# Patient Record
Sex: Female | Born: 1940 | Race: White | Hispanic: No | Smoking: Former smoker
Health system: Southern US, Community
[De-identification: ages and names within clinical notes are randomized; demographics above are authoritative.]

## PROBLEM LIST (undated history)

## (undated) ENCOUNTER — Emergency Department (HOSPITAL_BASED_OUTPATIENT_CLINIC_OR_DEPARTMENT_OTHER): Admission: EM | Payer: Medicare HMO | Source: Home / Self Care

## (undated) DIAGNOSIS — E119 Type 2 diabetes mellitus without complications: Secondary | ICD-10-CM

## (undated) DIAGNOSIS — M810 Age-related osteoporosis without current pathological fracture: Secondary | ICD-10-CM

## (undated) DIAGNOSIS — E785 Hyperlipidemia, unspecified: Secondary | ICD-10-CM

## (undated) DIAGNOSIS — I10 Essential (primary) hypertension: Secondary | ICD-10-CM

## (undated) DIAGNOSIS — J449 Chronic obstructive pulmonary disease, unspecified: Secondary | ICD-10-CM

## (undated) DIAGNOSIS — I509 Heart failure, unspecified: Secondary | ICD-10-CM

## (undated) HISTORY — DX: Age-related osteoporosis without current pathological fracture: M81.0

## (undated) HISTORY — DX: Heart failure, unspecified: I50.9

## (undated) HISTORY — DX: Essential (primary) hypertension: I10

## (undated) HISTORY — DX: Hyperlipidemia, unspecified: E78.5

## (undated) HISTORY — DX: Chronic obstructive pulmonary disease, unspecified: J44.9

## (undated) HISTORY — DX: Type 2 diabetes mellitus without complications: E11.9

---

## 2018-01-02 DIAGNOSIS — I509 Heart failure, unspecified: Secondary | ICD-10-CM | POA: Diagnosis not present

## 2018-01-02 DIAGNOSIS — J449 Chronic obstructive pulmonary disease, unspecified: Secondary | ICD-10-CM | POA: Diagnosis not present

## 2018-01-02 DIAGNOSIS — M810 Age-related osteoporosis without current pathological fracture: Secondary | ICD-10-CM | POA: Diagnosis not present

## 2018-01-02 DIAGNOSIS — E119 Type 2 diabetes mellitus without complications: Secondary | ICD-10-CM | POA: Diagnosis not present

## 2018-01-02 DIAGNOSIS — I10 Essential (primary) hypertension: Secondary | ICD-10-CM | POA: Diagnosis not present

## 2018-01-02 DIAGNOSIS — E785 Hyperlipidemia, unspecified: Secondary | ICD-10-CM | POA: Diagnosis not present

## 2018-01-06 DIAGNOSIS — J449 Chronic obstructive pulmonary disease, unspecified: Secondary | ICD-10-CM | POA: Diagnosis not present

## 2018-01-06 DIAGNOSIS — J9611 Chronic respiratory failure with hypoxia: Secondary | ICD-10-CM | POA: Diagnosis not present

## 2018-01-06 DIAGNOSIS — J432 Centrilobular emphysema: Secondary | ICD-10-CM | POA: Diagnosis not present

## 2018-01-25 DIAGNOSIS — N3000 Acute cystitis without hematuria: Secondary | ICD-10-CM | POA: Diagnosis not present

## 2018-02-02 DIAGNOSIS — I10 Essential (primary) hypertension: Secondary | ICD-10-CM | POA: Diagnosis not present

## 2018-02-02 DIAGNOSIS — N390 Urinary tract infection, site not specified: Secondary | ICD-10-CM | POA: Diagnosis not present

## 2018-02-02 DIAGNOSIS — M545 Low back pain: Secondary | ICD-10-CM | POA: Diagnosis not present

## 2018-02-05 ENCOUNTER — Ambulatory Visit: Payer: Medicare HMO | Admitting: Pulmonary Disease

## 2018-02-05 ENCOUNTER — Ambulatory Visit (INDEPENDENT_AMBULATORY_CARE_PROVIDER_SITE_OTHER)
Admission: RE | Admit: 2018-02-05 | Discharge: 2018-02-05 | Disposition: A | Discharge status: Home / Self Care | Payer: Medicare HMO | Source: Ambulatory Visit | Attending: Pulmonary Disease | Admitting: Pulmonary Disease

## 2018-02-05 ENCOUNTER — Encounter: Payer: Self-pay | Admitting: Pulmonary Disease

## 2018-02-05 VITALS — BP 116/78 | HR 80 | Ht 62.0 in | Wt 161.0 lb

## 2018-02-05 DIAGNOSIS — R0600 Dyspnea, unspecified: Secondary | ICD-10-CM | POA: Diagnosis not present

## 2018-02-05 DIAGNOSIS — J449 Chronic obstructive pulmonary disease, unspecified: Secondary | ICD-10-CM

## 2018-02-05 DIAGNOSIS — Z23 Encounter for immunization: Secondary | ICD-10-CM

## 2018-02-05 DIAGNOSIS — J9612 Chronic respiratory failure with hypercapnia: Secondary | ICD-10-CM | POA: Insufficient documentation

## 2018-02-05 DIAGNOSIS — R0609 Other forms of dyspnea: Secondary | ICD-10-CM | POA: Diagnosis not present

## 2018-02-05 DIAGNOSIS — J432 Centrilobular emphysema: Secondary | ICD-10-CM | POA: Diagnosis not present

## 2018-02-05 DIAGNOSIS — J9611 Chronic respiratory failure with hypoxia: Secondary | ICD-10-CM | POA: Insufficient documentation

## 2018-02-05 NOTE — Assessment & Plan Note (Signed)
We will write prescription for new portable concentrator to [facility].   Handicapped placard    Referral to pulmonary rehab at [facility]

## 2018-02-05 NOTE — Assessment & Plan Note (Signed)
Continue on Anoro. Prevnar vaccine Please take flu shot every year around late September

## 2018-02-05 NOTE — Patient Instructions (Signed)
Continue on Anoro.  We will write prescription for new portable concentrator to [facility]. Breathing test today.  Handicapped placard  Prevnar vaccine Please take flu shot every year around late September  Referral to pulmonary rehab at [facility]

## 2018-02-05 NOTE — Addendum Note (Signed)
Addended by: Maurene Capes on: 02/05/2018 02:09 PM   Modules accepted: Orders

## 2018-02-05 NOTE — Progress Notes (Signed)
Subjective:    Patient ID: Rebecca [name], female    DOB: Jul 01, 1941, 77 y.o.   MRN: [id number]  HPI  77 year old ex-smoker presents to establish care for COPD and chronic respiratory failure. She smoked about a pack per day until she quit in 2003, more than 40 pack years.  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  At that point she was provided oxygen and has been maintained on 3 L since then, about 2018 she increased to 4 L and walking.  There are times when she stays off oxygen at rest. She has moved from [place] to [place] to be closer to her daughter and lives at [place] in assisted living.  She reports occasional wheezing and a dry cough.  Is able to walk on level ground, in the store she had generally rides or use a Rollator because she is unable to carry her oxygen.  She admits to a sedentary lifestyle and is unable to walk her dog for long distances.  She denies frequent chest colds.  She would like a handicap placard today. She also requests a new portable concentrator since hers is beeping and she would like one for travel   She has received Pneumovax but has not received Prevnar.  She did not get the flu shot last year.  Past medical history-diabetes, hyperlipidemia   Significant tests/ events reviewed Spirometry performed today showed severe airway obstruction with ratio 43, FEV1 of 0.57-31% and FVC of 53%.  Oxygen saturation was 91% RA at rest , dropped to 82% on room air, and stayed up on 3 L pulse O2 on ambulation   Past Medical History:  Diagnosis Date  . Congestive heart disease (HCC)   . COPD (chronic obstructive pulmonary disease) (HCC)   . Diabetes (HCC)   . Hyperlipemia   . Osteoporosis   . Primary hypertension      PSH -hysterectomy, tubal ligation, back  surgery   Allergies  Allergen Reactions  . Ciprofloxacin Other (See Comments)    Digestive problems  . Amoxicillin Rash  . Lisinopril Rash  . Sulfacetamide Rash  . Tetracyclines & Related Rash    Social History   Socioeconomic History  . Marital status: Widowed    Spouse name: Not on file  . Number of children: Not on file  . Years of education: Not on file  . Highest education level: Not on file  Occupational History  . Not on file  Social Needs  . Financial resource strain: Not on file  . Food insecurity:    Worry: Not on file    Inability: Not on file  . Transportation needs:    Medical: Not on file    Non-medical: Not on file  Tobacco Use  . Smoking status: Former Smoker    Types: Cigarettes    Last attempt to quit: 2003    Years since quitting: 16.5  . Smokeless tobacco: Never Used  Substance and Sexual Activity  . Alcohol use: Yes    Alcohol/week: 0.6 oz    Types: 1 Glasses of wine per week  . Drug use: Never  . Sexual activity: Not on file  Lifestyle  . Physical activity:    Days per week: Not on file    Minutes per session: Not on file  . Stress: Not on file  Relationships  . Social connections:    Talks on phone: Not on file    Gets together: Not on file    Attends religious service: Not on file    Active member of club or organization: Not on file    Attends meetings of clubs or organizations: Not on file    Relationship status: Not on file  . Intimate partner violence:    Fear of current or ex partner: Not on file    Emotionally abused: Not on file    Physically abused: Not on file    Forced sexual activity: Not on file  Other Topics Concern  . Not on file  Social History Narrative  . Not on file     Family history of heart disease in father and mother  Review of Systems  Constitutional: Negative for fever and unexpected weight change.  HENT: Negative for congestion, dental problem, ear pain, nosebleeds, postnasal drip, rhinorrhea,  sinus pressure, sneezing, sore throat and trouble swallowing.   Eyes: Negative for redness and itching.  Respiratory: Positive for shortness of breath. Negative for cough, chest tightness and wheezing.   Cardiovascular: Negative for palpitations and leg swelling.  Gastrointestinal: Negative for nausea and vomiting.  Genitourinary: Negative for dysuria.  Musculoskeletal: Negative for joint swelling.  Skin: Negative for rash.  Allergic/Immunologic: Negative.  Negative for environmental allergies, food allergies and immunocompromised state.  Neurological: Negative for headaches.  Hematological: Does not bruise/bleed easily.  Psychiatric/Behavioral: Negative for dysphoric mood. The patient is not nervous/anxious.        Objective:   Physical Exam  Gen. Pleasant, elderly,well-nourished, in no distress, on [place],normal affect ENT - no lesions, no post nasal drip Neck: No JVD, no thyromegaly, no carotid bruits Lungs: no use of accessory muscles, no dullness to percussion, decreased without rales or rhonchi  Cardiovascular: Rhythm regular, heart sounds  normal, no murmurs or gallops, no peripheral edema Abdomen: soft and non-tender, no hepatosplenomegaly, BS normal. Musculoskeletal: No deformities, no cyanosis or clubbing Neuro:  [name], non focal       Assessment & Plan:

## 2018-02-06 ENCOUNTER — Telehealth (HOSPITAL_COMMUNITY): Payer: Self-pay

## 2018-02-06 NOTE — Telephone Encounter (Signed)
Attempted to contact patient in regards to Pulmonary Rehab - lm on vm °

## 2018-02-09 ENCOUNTER — Telehealth (HOSPITAL_COMMUNITY): Payer: Self-pay

## 2018-02-09 NOTE — Telephone Encounter (Signed)
Patient returned phone call in regards to Pulmonary Rehab - Patient stated she is interested in the program. She will come in for orientation on 02/23/18 @ 9:30AM and will attend the 10:30AM class.

## 2018-02-10 ENCOUNTER — Telehealth: Payer: Self-pay | Admitting: Pulmonary Disease

## 2018-02-10 NOTE — Telephone Encounter (Signed)
Called and spoke with Patient.  She stated that she received a phone call 02/09/18 with CXR results.  She stated that she was told it showed emphysema and that Dr. Vassie Loll would discuss further at next OV.  Her next OV is 07/2018.  She stated that she had questions and would like Dr. Vassie Loll to call her at [phone], and answer her questions. I explained that Dr. Vassie Loll was not in the office this week, but I would send him a message.  Will route to Dr. Vassie Loll

## 2018-02-12 NOTE — Telephone Encounter (Signed)
Routing message to RA to advise

## 2018-02-17 NOTE — Telephone Encounter (Signed)
Pease advise, Dr Vassie Loll.  Thank you.

## 2018-02-18 NOTE — Telephone Encounter (Signed)
Clarified CXR results Emphasized pulm rehab  Reschedule OV in 2 months with NP/ me

## 2018-02-19 NOTE — Telephone Encounter (Signed)
Attempted to call patient today regarding results. I did not receive an answer at time of call. I have left a voicemail message for pt to return call. X1  

## 2018-02-19 NOTE — Telephone Encounter (Signed)
lmtcb for pt. Pt just needs to be scheduled for a 2 month follow up.Marland Kitchen

## 2018-02-19 NOTE — Telephone Encounter (Signed)
Pt is calling back [phone]

## 2018-02-20 NOTE — Telephone Encounter (Signed)
Called spoke with patient who reported she had already spoken with RA about clarification on the cxr results. Scheduled appt with RA on 10.1.19 @ 1015. Nothing further needed; will sign off.

## 2018-02-20 NOTE — Telephone Encounter (Signed)
Attempted to call pt. I did not receive an answer. I have left a message for pt to return our call.  

## 2018-02-20 NOTE — Telephone Encounter (Signed)
Pt is returning call. Cb is [phone]

## 2018-02-27 ENCOUNTER — Encounter (HOSPITAL_COMMUNITY): Payer: Self-pay

## 2018-02-27 ENCOUNTER — Encounter (HOSPITAL_COMMUNITY)
Admission: RE | Admit: 2018-02-27 | Discharge: 2018-02-27 | Disposition: A | Discharge status: Home / Self Care | Payer: Medicare HMO | Source: Ambulatory Visit | Attending: Pulmonary Disease | Admitting: Pulmonary Disease

## 2018-02-27 VITALS — BP 148/62 | HR 73 | Temp 98.1°F | Resp 20 | Ht 62.5 in | Wt 160.5 lb

## 2018-02-27 DIAGNOSIS — J9611 Chronic respiratory failure with hypoxia: Secondary | ICD-10-CM | POA: Insufficient documentation

## 2018-02-27 NOTE — Progress Notes (Signed)
Rebecca [name] Alert 77 y.o. female Pulmonary Rehab Orientation Note [name] referred to pulmonary rehab by Dr. Vassie Loll for Chronic Respiratory Failure hypoxia, arrived today in Cardiac and Pulmonary Rehab for orientation to Pulmonary Rehab. She was transported from [facility] [employer] via wheel chair accompanied by her granddaughterl. She does carry portable oxygen. Per pt, she uses oxygen continuously. Color good, skin warm and dry. Patient is oriented to time and place. Patient's medical history, psychosocial health, and medications reviewed. Psychosocial assessment reveals pt lives in an assisted living facility. Pt is currently retired. Pt hobbies include painting. Pt reports her stress level is not applicable since moving to [place] to be closer to family. Pt admits to having a history of depression years back when her husband passed away.  Pt did not have a core group of friends so when he passed away she was very lonely.  Pt feels so much better being in the assistive living facility where she has developed friends and participates in many of the activities at the facility.  Pt is now lives closer to family and this has been awesome for her. PHQ2/9 score 0/1. Pt shows good  coping skills with positive outlook .  Will continue to monitor and evaluate progress toward psychosocial goal(s) of continued mental well being. Physical assessment reveals heart rate is normal, breath sounds diminished. Grip strength equal, strong. Distal pulses palpable with trace swelling in the ankles. Patient reports she does take medications as prescribed. Patient states she follows a Regular diet. The patient reports no specific efforts to gain or lose weight. She has gained 9 pounds since moving to the facility.  Pt feels this is because she is eating three meals a day in the dining hall.  Pt tries to chose healthier items and would like to lose the 10 pounds.  Patient's weight will be monitored closely. Demonstration and practice of  PLB using pulse oximeter. Patient able to return demonstration satisfactorily. Safety and hand hygiene in the exercise area reviewed with patient. Patient voices understanding of the information reviewed. Department expectations discussed with patient and achievable goals were set. The patient shows enthusiasm about attending the program and we look forward to working with this nice lady. The patient is scheduled for a 6 min walk test on 8/22 at 1030 because of the availability of transportation to bring her and to begin exercise on 8/29 at 10:30.  45 minutes was spent on a variety of activities such as assessment of the patient, obtaining baseline data including height, weight, BMI, and grip strength, verifying medical history, allergies, and current medications, and teaching patient strategies for performing tasks with less respiratory effort with emphasis on pursed lip breathing. Karlene Lineman RN, BSN Cardiac and Emergency planning/management officer [phone]

## 2018-03-03 NOTE — Progress Notes (Signed)
Rebecca [name] 77 y.o. female  DOB: 03/25/1941 MRN: [id number]           Nutrition Note 1. Chronic respiratory failure with hypoxia [name])    Past Medical History:  Diagnosis Date  . Congestive heart disease (HCC)   . COPD (chronic obstructive pulmonary disease) (HCC)   . Diabetes (HCC)   . Hyperlipemia   . Osteoporosis   . Primary hypertension    Meds reviewed. Albuterol, calcium, vit D, coreg, metformin, crestor noted  Ht: Ht Readings from Last 1 Encounters:  02/27/18 5' 2.5" (1.588 m)     Wt:  Wt Readings from Last 3 Encounters:  02/27/18 160 lb 7.9 oz (72.8 kg)  02/05/18 161 lb (73 kg)     BMI: Body mass index is 28.89 kg/m.    Current tobacco use? No     Labs:  Lipid Panel  No results found for: CHOL, TRIG, HDL, CHOLHDL, VLDL, LDLCALC, LDLDIRECT  No results found for: HGBA1C  Nutrition Diagnosis  ? Overweight related to excessive energy intake as evidenced by a BMI of Body mass index is 28.89 kg/m.    Goal(s) 1. Pt to identify and limit food sources of sodium 2. Identify food quantities necessary to achieve wt loss of  -2# per week to a goal wt loss of 6-10 lbs at graduation from pulmonary rehab. 3. Describe the benefit of including fruits, vegetables, whole grains, and low-fat dairy products in a healthy meal plan.  Plan:  Pt to attend Pulmonary Nutrition class Will provide client-centered nutrition education as part of interdisciplinary care.    Monitor and Evaluate progress toward nutrition goal with team.   Rebecca Marcus, MS, RD, LDN 03/03/2018 9:37 AM

## 2018-03-04 ENCOUNTER — Encounter (HOSPITAL_COMMUNITY): Payer: Self-pay | Admitting: *Deleted

## 2018-03-05 ENCOUNTER — Encounter (HOSPITAL_COMMUNITY)
Admission: RE | Admit: 2018-03-05 | Discharge: 2018-03-05 | Disposition: A | Discharge status: Home / Self Care | Payer: Medicare HMO | Source: Ambulatory Visit | Attending: Pulmonary Disease | Admitting: Pulmonary Disease

## 2018-03-05 DIAGNOSIS — J9611 Chronic respiratory failure with hypoxia: Secondary | ICD-10-CM | POA: Diagnosis not present

## 2018-03-05 NOTE — Progress Notes (Signed)
Pulmonary Individual Treatment Plan  Patient Details  Name: Rebecca [name] MRN: [id number] Date of Birth: [birth year] Referring Provider:     Pulmonary Rehab Walk Test from 03/05/2018 in [facility]  Referring Provider  Vassie Loll      Initial Encounter Date:    Pulmonary Rehab Walk Test from 03/05/2018 in [facility]ARD[facility]  Date  03/05/18      Visit Diagnosis: Chronic respiratory failure with hypoxia (HCC)  Patient's Home Medications on Admission:   Current Outpatient Medications:  .  albuterol (PROVENTIL HFA;VENTOLIN HFA) 108 (90 Base) MCG/ACT inhaler, Inhale 1 puff into the lungs every 6 (six) hours as needed for wheezing or shortness of breath., Disp: , Rfl:  .  Calcium Carbonate-Vitamin D (CALCIUM 600+D) 600-200 MG-UNIT TABS, Take by mouth., Disp: , Rfl:  .  carvedilol (COREG) 25 MG tablet, 2 (two) times daily. , Disp: , Rfl:  .  imipramine (TOFRANIL) 10 MG tablet, , Disp: , Rfl:  .  metFORMIN (GLUCOPHAGE) 500 MG tablet, daily with supper. , Disp: , Rfl:  .  rosuvastatin (CRESTOR) 5 MG tablet, , Disp: , Rfl:  .  umeclidinium-vilanterol (ANORO ELLIPTA) 62.5-25 MCG/INH AEPB, , Disp: , Rfl:   Past Medical History: Past Medical History:  Diagnosis Date  . Congestive heart disease (HCC)   . COPD (chronic obstructive pulmonary disease) (HCC)   . Diabetes (HCC)   . Hyperlipemia   . Osteoporosis   . Primary hypertension     Tobacco Use: Social History   Tobacco Use  Smoking Status Former Smoker  . Types: Cigarettes  . Last attempt to quit: 2003  . Years since quitting: 16.6  Smokeless Tobacco Never Used    Labs: Recent Review Flowsheet Data    There is no flowsheet data to display.      Capillary Blood Glucose: No results found for: GLUCAP   Pulmonary Assessment Scores: Pulmonary Assessment Scores    Row Name 03/04/18 1518 03/05/18 1311       ADL UCSD   ADL Phase  Entry  Entry    SOB Score total  70  -       CAT Score   CAT Score  20  -      mMRC Score   mMRC Score  -  1       Pulmonary Function Assessment:   Exercise Target Goals: Exercise Program Goal: Individual exercise prescription set using results from initial 6 min walk test and THRR while considering  patient's activity barriers and safety.   Exercise Prescription Goal: Initial exercise prescription builds to 30-45 minutes a day of aerobic activity, 2-3 days per week.  Home exercise guidelines will be given to patient during program as part of exercise prescription that the participant will acknowledge.  Activity Barriers & Risk Stratification: Activity Barriers & Cardiac Risk Stratification - 02/27/18 1027      Activity Barriers & Cardiac Risk Stratification   Activity Barriers  Back Problems;History of Falls;Shortness of Breath;Deconditioning;Assistive Device;Joint Problems   Right knee cap surgery   Cardiac Risk Stratification  High       6 Minute Walk: 6 Minute Walk    Row Name 03/05/18 1306         6 Minute Walk   Phase  Initial     Distance  1000 feet     Walk Time  - 5 minutes 30 seconds     # of Rest Breaks  1 30 second  break standing     MPH  1.89     METS  2.38     RPE  12     Perceived Dyspnea   3     Symptoms  No     Resting HR  78 bpm     Resting BP  124/66     Resting Oxygen Saturation   97 %     Exercise Oxygen Saturation  during 6 min walk  90 %     Max Ex. HR  97 bpm     Max Ex. BP  124/84       Interval HR   1 Minute HR  84     2 Minute HR  91     3 Minute HR  95     4 Minute HR  97     5 Minute HR  94     6 Minute HR  94     2 Minute Post HR  85     Interval Heart Rate?  Yes       Interval Oxygen   Interval Oxygen?  Yes     Baseline Oxygen Saturation %  97 %     1 Minute Oxygen Saturation %  97 %     1 Minute Liters of Oxygen  4 L     2 Minute Oxygen Saturation %  92 %     2 Minute Liters of Oxygen  4 L     3 Minute Oxygen Saturation %  90 %     3 Minute Liters of  Oxygen  4 L     4 Minute Oxygen Saturation %  93 %     4 Minute Liters of Oxygen  4 L     5 Minute Oxygen Saturation %  94 %     5 Minute Liters of Oxygen  4 L     6 Minute Oxygen Saturation %  94 %     6 Minute Liters of Oxygen  4 L     2 Minute Post Oxygen Saturation %  100 %     2 Minute Post Liters of Oxygen  4 L        Oxygen Initial Assessment: Oxygen Initial Assessment - 03/05/18 1311      Initial 6 min Walk   Oxygen Used  Continuous;E-Tanks    Liters per minute  4      Program Oxygen Prescription   Program Oxygen Prescription  Continuous;E-Tanks    Liters per minute  4       Oxygen Re-Evaluation:   Oxygen Discharge (Final Oxygen Re-Evaluation):   Initial Exercise Prescription: Initial Exercise Prescription - 03/05/18 1300      Date of Initial Exercise RX and Referring Provider   Date  03/05/18    Referring Provider  Vassie Loll      Oxygen   Oxygen  Continuous    Liters  4      Recumbant Bike   Level  2    Watts  10    Minutes  17      NuStep   Level  2    SPM  80    Minutes  17    METs  1.5      Track   Laps  5    Minutes  17      Prescription Details   Frequency (times per week)  2    Duration  Progress to 45 minutes  of aerobic exercise without signs/symptoms of physical distress      Intensity   THRR 40-80% of Max Heartrate  57-114    Ratings of Perceived Exertion  11-13    Perceived Dyspnea  0-4      Progression   Progression  Continue progressive overload as per policy without signs/symptoms or physical distress.      Resistance Training   Training Prescription  Yes    Weight  orange bands    Reps  10-15       Perform Capillary Blood Glucose checks as needed.  Exercise Prescription Changes:   Exercise Comments:   Exercise Goals and Review: Exercise Goals    Row Name 02/27/18 1029             Exercise Goals   Increase Physical Activity  Yes       Intervention  Provide advice, education, support and counseling about  physical activity/exercise needs.;Develop an individualized exercise prescription for aerobic and resistive training based on initial evaluation findings, risk stratification, comorbidities and participant's personal goals.       Expected Outcomes  Short Term: Attend rehab on a regular basis to increase amount of physical activity.;Long Term: Add in home exercise to make exercise part of routine and to increase amount of physical activity.;Long Term: Exercising regularly at least 3-5 days a week.       Increase Strength and Stamina  Yes       Intervention  Provide advice, education, support and counseling about physical activity/exercise needs.;Develop an individualized exercise prescription for aerobic and resistive training based on initial evaluation findings, risk stratification, comorbidities and participant's personal goals.       Expected Outcomes  Short Term: Increase workloads from initial exercise prescription for resistance, speed, and METs.;Short Term: Perform resistance training exercises routinely during rehab and add in resistance training at home;Long Term: Improve cardiorespiratory fitness, muscular endurance and strength as measured by increased METs and functional capacity ( )       Able to understand and use rate of perceived exertion (RPE) scale  Yes       Intervention  Provide education and explanation on how to use RPE scale       Expected Outcomes  Short Term: Able to use RPE daily in rehab to express subjective intensity level;Long Term:  Able to use RPE to guide intensity level when exercising independently       Able to understand and use Dyspnea scale  Yes       Intervention  Provide education and explanation on how to use Dyspnea scale       Expected Outcomes  Short Term: Able to use Dyspnea scale daily in rehab to express subjective sense of shortness of breath during exertion;Long Term: Able to use Dyspnea scale to guide intensity level when exercising independently        Knowledge and understanding of Target Heart Rate Range (THRR)  Yes       Intervention  Provide education and explanation of THRR including how the numbers were predicted and where they are located for reference       Expected Outcomes  Short Term: Able to state/look up THRR;Short Term: Able to use daily as guideline for intensity in rehab;Long Term: Able to use THRR to govern intensity when exercising independently       Understanding of Exercise Prescription  Yes       Intervention  Provide education, explanation, and written materials on patient's individual exercise prescription  Expected Outcomes  Short Term: Able to explain program exercise prescription;Long Term: Able to explain home exercise prescription to exercise independently          Exercise Goals Re-Evaluation :   Discharge Exercise Prescription (Final Exercise Prescription Changes):   Nutrition:  Target Goals: Understanding of nutrition guidelines, daily intake of sodium 1500mg , cholesterol 200mg , calories 30% from fat and 7% or less from saturated fats, daily to have 5 or more servings of fruits and vegetables.  Biometrics:    Nutrition Therapy Plan and Nutrition Goals: Nutrition Therapy & Goals - 03/03/18 0938      Nutrition Therapy   Diet  general healthful      Personal Nutrition Goals   Nutrition Goal  Identify food quantities necessary to achieve wt loss of  -2# per week to a goal wt loss of 6-10 lbs at graduation from pulmonary rehab.    Personal Goal #2  Describe the benefit of including fruits, vegetables, whole grains, and low-fat dairy products in a healthy meal plan.      Intervention Plan   Intervention  Prescribe, educate and counsel regarding individualized specific dietary modifications aiming towards targeted core components such as weight, hypertension, lipid management, diabetes, heart failure and other comorbidities.    Expected Outcomes  Short Term Goal: Understand basic principles of  dietary content, such as calories, fat, sodium, cholesterol and nutrients.       Nutrition Assessments: Nutrition Assessments - 03/03/18 0941      Rate Your Plate Scores   Pre Score  50       Nutrition Goals Re-Evaluation: Nutrition Goals Re-Evaluation    Row Name 03/03/18 0938             Goals   Current Weight  160 lb 15 oz (73 kg)          Nutrition Goals Discharge (Final Nutrition Goals Re-Evaluation): Nutrition Goals Re-Evaluation - 03/03/18 0938      Goals   Current Weight  160 lb 15 oz (73 kg)       Psychosocial: Target Goals: Acknowledge presence or absence of significant depression and/or stress, maximize coping skills, provide positive support system. Participant is able to verbalize types and ability to use techniques and skills needed for reducing stress and depression.  Initial Review & Psychosocial Screening: Initial Psych Review & Screening - 02/27/18 1036      Initial Review   Current issues with  History of Depression      Family Dynamics   Good Support System?  Yes   moved to [place] to be near children and grandchildren   Comments  support of family and freinds at the independent living      Barriers   Psychosocial barriers to participate in program  There are no identifiable barriers or psychosocial needs.      Screening Interventions   Interventions  Encouraged to exercise    Expected Outcomes  Short Term goal: Identification and review with participant of any Quality of Life or Depression concerns found by scoring the questionnaire.;Long Term goal: The participant improves quality of Life and PHQ9 Scores as seen by post scores and/or verbalization of changes       Quality of Life Scores:  Scores of 19 and below usually indicate a poorer quality of life in these areas.  A difference of  2-3 points is a clinically meaningful difference.  A difference of 2-3 points in the total score of the Quality of Life Index has  been associated  with significant improvement in overall quality of life, self-image, physical symptoms, and general health in studies assessing change in quality of life.  PHQ-9: Recent Review Flowsheet Data    Depression screen [facility] 2/9 02/27/2018   Decreased Interest 0   Down, Depressed, Hopeless 0   PHQ - 2 Score 0   Altered sleeping 0   Tired, decreased energy 1   Change in appetite 0   Feeling bad or failure about yourself  0   Trouble concentrating 0   Moving slowly or fidgety/restless 0   Suicidal thoughts 0   PHQ-9 Score 1   Difficult doing work/chores Not difficult at all     Interpretation of Total Score  Total Score Depression Severity:  1-4 = Minimal depression, 5-9 = Mild depression, 10-14 = Moderate depression, 15-19 = Moderately severe depression, 20-27 = Severe depression   Psychosocial Evaluation and Intervention: Psychosocial Evaluation - 02/27/18 1038      Psychosocial Evaluation & Interventions   Interventions  Relaxation education;Stress management education;Encouraged to exercise with the program and follow exercise prescription    Comments  no identifiable barriers    Expected Outcomes  Continue to maintain sense well being     Continue Psychosocial Services   Follow up required by staff       Psychosocial Re-Evaluation:   Psychosocial Discharge (Final Psychosocial Re-Evaluation):   Education: Education Goals: Education classes will be provided on a weekly basis, covering required topics. Participant will state understanding/return demonstration of topics presented.  Learning Barriers/Preferences: Learning Barriers/Preferences - 02/27/18 1039      Learning Barriers/Preferences   Learning Barriers  Sight    Learning Preferences  Skilled Demonstration;Group Instruction;Individual Instruction;Audio;Written Material       Education Topics: Risk Factor Reduction:  -Group instruction that is supported by a PowerPoint presentation. Instructor discusses the  definition of a risk factor, different risk factors for pulmonary disease, and how the heart and lungs work together.     Nutrition for Pulmonary Patient:  -Group instruction provided by PowerPoint slides, verbal discussion, and written materials to support subject matter. The instructor gives an explanation and review of healthy diet recommendations, which includes a discussion on weight management, recommendations for fruit and vegetable consumption, as well as protein, fluid, caffeine, fiber, sodium, sugar, and alcohol. Tips for eating when patients are short of breath are discussed.   Pursed Lip Breathing:  -Group instruction that is supported by demonstration and informational handouts. Instructor discusses the benefits of pursed lip and diaphragmatic breathing and detailed demonstration on how to preform both.     Oxygen Safety:  -Group instruction provided by PowerPoint, verbal discussion, and written material to support subject matter. There is an overview of "What is Oxygen" and "Why do we need it".  Instructor also reviews how to create a safe environment for oxygen use, the importance of using oxygen as prescribed, and the risks of noncompliance. There is a brief discussion on traveling with oxygen and resources the patient may utilize.   Oxygen Equipment:  -Group instruction provided by [facility] Staff utilizing handouts, written materials, and equipment demonstrations.   Signs and Symptoms:  -Group instruction provided by written material and verbal discussion to support subject matter. Warning signs and symptoms of infection, stroke, and heart attack are reviewed and when to call the physician/911 reinforced. Tips for preventing the spread of infection discussed.   Advanced Directives:  -Group instruction provided by verbal instruction and written material to support subject matter. Instructor  reviews Advanced Directive laws and proper instruction for filling out  document.   Pulmonary Video:  -Group video education that reviews the importance of medication and oxygen compliance, exercise, good nutrition, pulmonary hygiene, and pursed lip and diaphragmatic breathing for the pulmonary patient.   Exercise for the Pulmonary Patient:  -Group instruction that is supported by a PowerPoint presentation. Instructor discusses benefits of exercise, core components of exercise, frequency, duration, and intensity of an exercise routine, importance of utilizing pulse oximetry during exercise, safety while exercising, and options of places to exercise outside of rehab.     Pulmonary Medications:  -Verbally interactive group education provided by instructor with focus on inhaled medications and proper administration.   Anatomy and Physiology of the Respiratory System and Intimacy:  -Group instruction provided by PowerPoint, verbal discussion, and written material to support subject matter. Instructor reviews respiratory cycle and anatomical components of the respiratory system and their functions. Instructor also reviews differences in obstructive and restrictive respiratory diseases with examples of each. Intimacy, Sex, and Sexuality differences are reviewed with a discussion on how relationships can change when diagnosed with pulmonary disease. Common sexual concerns are reviewed.   MD DAY -A group question and answer session with a medical doctor that allows participants to ask questions that relate to their pulmonary disease state.   OTHER EDUCATION -Group or individual verbal, written, or video instructions that support the educational goals of the pulmonary rehab program.   Holiday Eating Survival Tips:  -Group instruction provided by PowerPoint slides, verbal discussion, and written materials to support subject matter. The instructor gives patients tips, tricks, and techniques to help them not only survive but enjoy the holidays despite the onslaught of  food that accompanies the holidays.   Knowledge Questionnaire Score: Knowledge Questionnaire Score - 03/04/18 1518      Knowledge Questionnaire Score   Pre Score  14/18       Core Components/Risk Factors/Patient Goals at Admission: Personal Goals and Risk Factors at Admission - 02/27/18 1040      Core Components/Risk Factors/Patient Goals on Admission    Weight Management  Yes;Weight Loss    Intervention  Weight Management: Develop a combined nutrition and exercise program designed to reach desired caloric intake, while maintaining appropriate intake of nutrient and fiber, sodium and fats, and appropriate energy expenditure required for the weight goal.;Weight Management: Provide education and appropriate resources to help participant work on and attain dietary goals.;Obesity: Provide education and appropriate resources to help participant work on and attain dietary goals.    Admit Weight  160 lb 7.9 oz (72.8 kg)    Goal Weight: Short Term  155 lb (70.3 kg)    Goal Weight: Long Term  150 lb (68 kg)    Expected Outcomes  Understanding of distribution of calorie intake throughout the day with the consumption of 4-5 meals/snacks;Understanding recommendations for meals to include 15-35% energy as protein, 25-35% energy from fat, 35-60% energy from carbohydrates, less than 200mg  of dietary cholesterol, 20-35 gm of total fiber daily;Weight Loss: Understanding of general recommendations for a balanced deficit meal plan, which promotes 1-2 lb weight loss per week and includes a negative energy balance of [phone] kcal/d;Long Term: Adherence to nutrition and physical activity/exercise program aimed toward attainment of established weight goal;Short Term: Continue to assess and modify interventions until short term weight is achieved    Improve shortness of breath with ADL's  Yes    Intervention  Provide education, individualized exercise plan and daily activity instruction to  help decrease symptoms of  SOB with activities of daily living.    Expected Outcomes  Short Term: Improve cardiorespiratory fitness to achieve a reduction of symptoms when performing ADLs;Long Term: Be able to perform more ADLs without symptoms or delay the onset of symptoms    Diabetes  Yes    Intervention  Provide education about signs/symptoms and action to take for hypo/hyperglycemia.;Provide education about proper nutrition, including hydration, and aerobic/resistive exercise prescription along with prescribed medications to achieve blood glucose in normal ranges: Fasting glucose 65-99 mg/dL    Expected Outcomes  Short Term: Participant verbalizes understanding of the signs/symptoms and immediate care of hyper/hypoglycemia, proper foot care and importance of medication, aerobic/resistive exercise and nutrition plan for blood glucose control.;Long Term: Attainment of HbA1C < 7%.    Hypertension  Yes    Intervention  Provide education on lifestyle modifcations including regular physical activity/exercise, weight management, moderate sodium restriction and increased consumption of fresh fruit, vegetables, and low fat dairy, alcohol moderation, and smoking cessation.;Monitor prescription use compliance.    Expected Outcomes  Short Term: Continued assessment and intervention until BP is < 140/59mm HG in hypertensive participants. < 130/10mm HG in hypertensive participants with diabetes, heart failure or chronic kidney disease.;Long Term: Maintenance of blood pressure at goal levels.    Lipids  Yes    Intervention  Provide education and support for participant on nutrition & aerobic/resistive exercise along with prescribed medications to achieve LDL 70mg , HDL >40mg .    Expected Outcomes  Short Term: Participant states understanding of desired cholesterol values and is compliant with medications prescribed. Participant is following exercise prescription and nutrition guidelines.;Long Term: Cholesterol controlled with medications as  prescribed, with individualized exercise RX and with personalized nutrition plan. Value goals: LDL < 70mg , HDL > 40 mg.       Core Components/Risk Factors/Patient Goals Review:    Core Components/Risk Factors/Patient Goals at Discharge (Final Review):    ITP Comments: ITP Comments    Row Name 02/27/18 1006           ITP Comments  Dr. Koren Bound, Medical Director          Comments:

## 2018-03-08 DIAGNOSIS — J432 Centrilobular emphysema: Secondary | ICD-10-CM | POA: Diagnosis not present

## 2018-03-08 DIAGNOSIS — J449 Chronic obstructive pulmonary disease, unspecified: Secondary | ICD-10-CM | POA: Diagnosis not present

## 2018-03-08 DIAGNOSIS — J9611 Chronic respiratory failure with hypoxia: Secondary | ICD-10-CM | POA: Diagnosis not present

## 2018-03-10 ENCOUNTER — Telehealth: Payer: Self-pay | Admitting: Pulmonary Disease

## 2018-03-10 DIAGNOSIS — N3000 Acute cystitis without hematuria: Secondary | ICD-10-CM | POA: Diagnosis not present

## 2018-03-10 MED ORDER — UMECLIDINIUM-VILANTEROL 62.5-25 MCG/INH IN AEPB
1.0000 | INHALATION_SPRAY | Freq: Every day | RESPIRATORY_TRACT | 3 refills | Status: DC
Start: 2018-03-10 — End: 2018-05-11

## 2018-03-10 NOTE — Telephone Encounter (Signed)
Called patient, unable to reach. Left detailed message advising that Medication refill of Anoro was sent to pharmacy. Nothing further needed.

## 2018-03-12 ENCOUNTER — Encounter (HOSPITAL_COMMUNITY)
Admission: RE | Admit: 2018-03-12 | Discharge: 2018-03-12 | Disposition: A | Discharge status: Home / Self Care | Payer: Medicare HMO | Source: Ambulatory Visit | Attending: Pulmonary Disease | Admitting: Pulmonary Disease

## 2018-03-12 DIAGNOSIS — J9611 Chronic respiratory failure with hypoxia: Secondary | ICD-10-CM

## 2018-03-12 NOTE — Progress Notes (Signed)
Daily Session Note  Patient Details  Name: Rebecca [name] MRN: [id number] Date of Birth: Jun 04, 1941 Referring Provider:     Pulmonary Rehab Walk Test from 03/05/2018 in [place]  Referring Provider  Elsworth Soho      Encounter Date: 03/12/2018  Check In: Session Check In - 03/12/18 0956      Check-In   Supervising physician immediately available to respond to emergencies  [facility] immediately available    Physician(s)  Dr. Florene Glen    Location  [facility]    Staff Present  Su Hilt, MS, ACSM RCEP, Exercise Physiologist;Carlette Wilber Oliphant, RN, BSN;Ramon Dredge, RN, MHA    Medication changes reported      No    Fall or balance concerns reported     No    Tobacco Cessation  No Change    Warm-up and Cool-down  Performed as group-led instruction    Resistance Training Performed  Yes    VAD Patient?  No    PAD/SET Patient?  No      Pain Assessment   Currently in Pain?  No/denies    Multiple Pain Sites  No       Capillary Blood Glucose: No results found for this or any previous visit (from the past 24 hour(s)).    Social History   Tobacco Use  Smoking Status Former Smoker  . Types: Cigarettes  . Last attempt to quit: 2003  . Years since quitting: 16.6  Smokeless Tobacco Never Used    Goals Met:  Exercise tolerated well  Goals Unmet:  Not Applicable  Comments: Service time is from 10:30a to 12:45p    Dr. Rush Farmer is Medical Director for Pulmonary Rehab at [facility].

## 2018-03-17 ENCOUNTER — Encounter (HOSPITAL_COMMUNITY)
Admission: RE | Admit: 2018-03-17 | Discharge: 2018-03-17 | Disposition: A | Discharge status: Home / Self Care | Payer: Medicare HMO | Source: Ambulatory Visit | Attending: Pulmonary Disease | Admitting: Pulmonary Disease

## 2018-03-17 VITALS — Wt 161.6 lb

## 2018-03-17 DIAGNOSIS — J9611 Chronic respiratory failure with hypoxia: Secondary | ICD-10-CM | POA: Diagnosis not present

## 2018-03-17 NOTE — Progress Notes (Signed)
Daily Session Note  Patient Details  Name: Rebecca [name] MRN: [id number] Date of Birth: 1940-12-31 Referring Provider:     Pulmonary Rehab Walk Test from 03/05/2018 in [place]  Referring Provider  Elsworth Soho      Encounter Date: 03/17/2018  Check In: Session Check In - 03/17/18 1222      Check-In   Supervising physician immediately available to respond to emergencies  [facility] immediately available    Physician(s)  Dr. Karleen Hampshire    Location  [facility]    Staff Present  Su Hilt, MS, ACSM RCEP, Exercise Physiologist;Carlette Wilber Oliphant, RN, BSN;Ramon Dredge, RN, MHA;Lashawne Dura Ysidro Evert, RN    Medication changes reported      No    Fall or balance concerns reported     No    Tobacco Cessation  No Change    Warm-up and Cool-down  Performed as group-led instruction    Resistance Training Performed  Yes    VAD Patient?  No    PAD/SET Patient?  No      Pain Assessment   Currently in Pain?  No/denies    Multiple Pain Sites  No       Capillary Blood Glucose: No results found for this or any previous visit (from the past 24 hour(s)). POCT Glucose - 03/17/18 1252      POCT Blood Glucose   Pre-Exercise  105 mg/dL    Post-Exercise  115 mg/dL      Exercise Prescription Changes - 03/17/18 1200      Response to Exercise   Blood Pressure (Admit)  134/70    Blood Pressure (Exercise)  150/80    Blood Pressure (Exit)  122/62    Heart Rate (Admit)  72 bpm    Heart Rate (Exercise)  87 bpm    Heart Rate (Exit)  79 bpm    Oxygen Saturation (Admit)  97 %    Oxygen Saturation (Exercise)  94 %    Oxygen Saturation (Exit)  99 %    Rating of Perceived Exertion (Exercise)  13    Perceived Dyspnea (Exercise)  2    Duration  Progress to 45 minutes of aerobic exercise without signs/symptoms of physical distress    Intensity  Other (comment)   40-80% of HRR     Progression   Progression  Continue to progress workloads to maintain  intensity without signs/symptoms of physical distress.      Resistance Training   Training Prescription  Yes    Weight  orange bands    Reps  10-15    Time  10 Minutes      Oxygen   Oxygen  Continuous    Liters  4      Recumbant Bike   Level  2    Watts  10    Minutes  17      NuStep   Level  2    SPM  80    Minutes  17    METs  1.4      Track   Laps  5    Minutes  17       Social History   Tobacco Use  Smoking Status Former Smoker  . Types: Cigarettes  . Last attempt to quit: 2003  . Years since quitting: 16.6  Smokeless Tobacco Never Used    Goals Met:  Exercise tolerated well No report of cardiac concerns or symptoms Strength training completed today  Goals Unmet:  Not Applicable  Comments: Service time is from 1030 to 1215    Dr. Rush Farmer is Medical Director for Pulmonary Rehab at [facility].

## 2018-03-19 ENCOUNTER — Encounter (HOSPITAL_COMMUNITY)
Admission: RE | Admit: 2018-03-19 | Discharge: 2018-03-19 | Disposition: A | Discharge status: Home / Self Care | Payer: Medicare HMO | Source: Ambulatory Visit | Attending: Pulmonary Disease | Admitting: Pulmonary Disease

## 2018-03-19 DIAGNOSIS — J9611 Chronic respiratory failure with hypoxia: Secondary | ICD-10-CM

## 2018-03-19 NOTE — Progress Notes (Signed)
Daily Session Note  Patient Details  Name: AYANA IMHOF MRN: [id number] Date of Birth: 12-May-1941 Referring Provider:     Pulmonary Rehab Walk Test from 03/05/2018 in [place]  Referring Provider  Elsworth Soho      Encounter Date: 03/19/2018  Check In: Session Check In - 03/19/18 1132      Check-In   Supervising physician immediately available to respond to emergencies  [facility] immediately available    Physician(s)  Dr. Darrick Meigs     Location  [facility]    Staff Present  Maurice Small, RN, BSN;Lorrin Nawrot Ysidro Evert, RN;Joann Rion, RN, BSN;Ramon Dredge, RN, MHA    Medication changes reported      No    Fall or balance concerns reported     No    Tobacco Cessation  No Change    Warm-up and Cool-down  Performed as group-led instruction    Resistance Training Performed  Yes    VAD Patient?  No    PAD/SET Patient?  No      Pain Assessment   Currently in Pain?  No/denies       Capillary Blood Glucose: No results found for this or any previous visit (from the past 24 hour(s)).    Social History   Tobacco Use  Smoking Status Former Smoker  . Types: Cigarettes  . Last attempt to quit: 2003  . Years since quitting: 16.6  Smokeless Tobacco Never Used    Goals Met:  Exercise tolerated well No report of cardiac concerns or symptoms Strength training completed today  Goals Unmet:  Not Applicable  Comments: Service time is from 1030 to 1150    Dr. Rush Farmer is Medical Director for Pulmonary Rehab at [facility].

## 2018-03-24 ENCOUNTER — Encounter (HOSPITAL_COMMUNITY)
Admission: RE | Admit: 2018-03-24 | Discharge: 2018-03-24 | Disposition: A | Discharge status: Home / Self Care | Payer: Medicare HMO | Source: Ambulatory Visit | Attending: Pulmonary Disease | Admitting: Pulmonary Disease

## 2018-03-24 DIAGNOSIS — J9611 Chronic respiratory failure with hypoxia: Secondary | ICD-10-CM | POA: Diagnosis not present

## 2018-03-24 NOTE — Progress Notes (Signed)
Daily Session Note  Patient Details  Name: Rebecca [name] MRN: [id number] Date of Birth: 03/09/1941 Referring Provider:     Pulmonary Rehab Walk Test from 03/05/2018 in [facility]  Referring Provider  [place]      Encounter Date: 03/24/2018  Check In: Session Check In - 03/24/18 1056      Check-In   Supervising physician immediately available to respond to emergencies  [facility] immediately available    Physician(s)  Dr. Lama     Location  [facility]    Staff Present  Carlette Carlton, RN, BSN;Lisa Hughes, RN;Annedrea Stackhouse, RN, MHA;Molly DiVincenzo, MS, ACSM RCEP, Exercise Physiologist    Medication changes reported      No    Fall or balance concerns reported     No    Tobacco Cessation  No Change    Warm-up and Cool-down  Performed as group-led instruction    Resistance Training Performed  Yes    VAD Patient?  No    PAD/SET Patient?  No      Pain Assessment   Currently in Pain?  No/denies    Multiple Pain Sites  No       Capillary Blood Glucose: No results found for this or any previous visit (from the past 24 hour(s)).    Social History   Tobacco Use  Smoking Status Former Smoker  . Types: Cigarettes  . Last attempt to quit: 2003  . Years since quitting: 16.7  Smokeless Tobacco Never Used    Goals Met:  Exercise tolerated well  Goals Unmet:  Not Applicable  Comments: Service time is from 10:30a to 12:20p    Dr. Wesam G. Yacoub is Medical Director for Pulmonary Rehab at [facility]. 

## 2018-03-24 NOTE — Progress Notes (Signed)
Rebecca [name] 77 y.o. female   DOB: [birth year] MRN: [id number]          Nutrition 1. Chronic respiratory failure with hypoxia [facility])    Past Medical History:  Diagnosis Date  . Congestive heart disease (HCC)   . COPD (chronic obstructive pulmonary disease) (HCC)   . Diabetes (HCC)   . Hyperlipemia   . Osteoporosis   . Primary hypertension      Meds reviewed.  Current Outpatient Medications (Endocrine & Metabolic):  .  metFORMIN (GLUCOPHAGE) 500 MG tablet, daily with supper.   Current Outpatient Medications (Cardiovascular):  .  carvedilol (COREG) 25 MG tablet, 2 (two) times daily.  .  rosuvastatin (CRESTOR) 5 MG tablet,   Current Outpatient Medications (Respiratory):  .  albuterol (PROVENTIL HFA;VENTOLIN HFA) 108 (90 Base) MCG/ACT inhaler, Inhale 1 puff into the lungs every 6 (six) hours as needed for wheezing or shortness of breath. .  umeclidinium-vilanterol (ANORO ELLIPTA) 62.5-25 MCG/INH AEPB, Inhale 1 puff into the lungs daily.    Current Outpatient Medications (Other):  Marland Kitchen  Calcium Carbonate-Vitamin D (CALCIUM 600+D) 600-200 MG-UNIT TABS, Take by mouth. Marland Kitchen  imipramine (TOFRANIL) 10 MG tablet,   Ht: Ht Readings from Last 1 Encounters:  02/27/18 5' 2.5" (1.588 m)     Wt:  Wt Readings from Last 3 Encounters:  03/17/18 161 lb 9.6 oz (73.3 kg)  02/27/18 160 lb 7.9 oz (72.8 kg)  02/05/18 161 lb (73 kg)     BMI: There is no height or weight on file to calculate BMI.     Current tobacco use? No  Labs:  Lipid Panel  No results found for: CHOL, TRIG, HDL, CHOLHDL, VLDL, LDLCALC, LDLDIRECT  No results found for: HGBA1C Note Spoke with pt. Pt is overweight, would like to lose 10 lbs. Discussed weight loss tips and tricks with patient, eating frequently across the day, limiting refined carbohydrates and sweets, increasing non-starchy vegetable consumption. Pt eats 3 meals a day; most prepared at home/ independent living. Pt shared that she does not snack, "never  have, never will". Reviewed the importance of eating regularly to increase metabolism and help with weight loss. Pt resistant to writers recommendations for weight loss at this time. Distributed education materials to patient and RD's contact information, encouraged her to reach out if she has any questions.  Nutrition Diagnosis  Overweight related to excessive energy intake as evidenced by a BMI of Body mass index is 28.89 kg/m.  Nutrition Intervention ? Pt's individual nutrition plan and goals reviewed with pt. ? Benefits of adopting healthy eating habits discussed when pt's Rate Your Plate reviewed.  Goal(s) 1. Pt to identify and limit food sources of sodium 2. Identify food quantities necessary to achieve wt loss of  -2# per week to a goal wt loss of 6-10 lbs at graduation from pulmonary rehab. 3. Describe the benefit of including fruits, vegetables, whole grains, and low-fat dairy products in a healthy meal plan.  Plan:  Pt to attend Pulmonary Nutrition class Will provide client-centered nutrition education as part of interdisciplinary care.    Monitor and Evaluate progress toward nutrition goal with team.   Ross Marcus, MS, RD, LDN 03/24/2018 12:10 PM

## 2018-03-26 ENCOUNTER — Encounter (HOSPITAL_COMMUNITY)
Admission: RE | Admit: 2018-03-26 | Discharge: 2018-03-26 | Disposition: A | Discharge status: Home / Self Care | Payer: Medicare HMO | Source: Ambulatory Visit | Attending: Pulmonary Disease | Admitting: Pulmonary Disease

## 2018-03-26 DIAGNOSIS — J9611 Chronic respiratory failure with hypoxia: Secondary | ICD-10-CM | POA: Diagnosis not present

## 2018-03-26 NOTE — Progress Notes (Signed)
Daily Session Note  Patient Details  Name: Rebecca [name] MRN: [id number] Date of Birth: [birth year] Referring Provider:     Pulmonary Rehab Walk Test from 03/05/2018 in [place]  Referring Provider  Elsworth Soho      Encounter Date: 03/26/2018  Check In: Session Check In - 03/26/18 1229      Check-In   Supervising physician immediately available to respond to emergencies  [facility] immediately available    Physician(s)  Dr. Dyann Kief    Location  [facility]    Staff Present  Maurice Small, RN, BSN;Alissa Pharr, MS, ACSM RCEP, Exercise Physiologist;Lisa Ysidro Evert, Felipe Drone, RN, MHA    Medication changes reported      No    Fall or balance concerns reported     No    Tobacco Cessation  No Change    Warm-up and Cool-down  Performed as group-led instruction    Resistance Training Performed  Yes    VAD Patient?  No    PAD/SET Patient?  No      Pain Assessment   Currently in Pain?  No/denies    Pain Score  0-No pain    Multiple Pain Sites  No       Capillary Blood Glucose: No results found for this or any previous visit (from the past 24 hour(s)).    Social History   Tobacco Use  Smoking Status Former Smoker  . Types: Cigarettes  . Last attempt to quit: 2003  . Years since quitting: 16.7  Smokeless Tobacco Never Used    Goals Met:  Exercise tolerated well  Goals Unmet:  Not Applicable  Comments: Service time is from 10:30a to 12:05p    Dr. Rush Farmer is Medical Director for Pulmonary Rehab at [facility].

## 2018-03-31 ENCOUNTER — Encounter (HOSPITAL_COMMUNITY)
Admission: RE | Admit: 2018-03-31 | Discharge: 2018-03-31 | Disposition: A | Discharge status: Home / Self Care | Payer: Medicare HMO | Source: Ambulatory Visit | Attending: Pulmonary Disease | Admitting: Pulmonary Disease

## 2018-03-31 VITALS — Wt 163.8 lb

## 2018-03-31 DIAGNOSIS — J9611 Chronic respiratory failure with hypoxia: Secondary | ICD-10-CM

## 2018-03-31 NOTE — Progress Notes (Signed)
Daily Session Note  Patient Details  Name: Rebecca [name] MRN: [id number] Date of Birth: 01-02-1941 Referring Provider:     Pulmonary Rehab Walk Test from 03/05/2018 in [place]  Referring Provider  Elsworth Soho      Encounter Date: 03/31/2018  Check In: Session Check In - 03/31/18 1243      Check-In   Supervising physician immediately available to respond to emergencies  [facility] immediately available    Physician(s)  Dr. Sloan Leiter    Location  [facility]    Staff Present  Maurice Small, RN, BSN;Mandy Fitzwater, MS, ACSM RCEP, Exercise Physiologist;Lisa Ysidro Evert, Felipe Drone, RN, MHA    Medication changes reported      No    Fall or balance concerns reported     No    Tobacco Cessation  No Change    Warm-up and Cool-down  Performed as group-led instruction    Resistance Training Performed  Yes    VAD Patient?  No    PAD/SET Patient?  No      Pain Assessment   Currently in Pain?  No/denies    Multiple Pain Sites  No       Capillary Blood Glucose: No results found for this or any previous visit (from the past 24 hour(s)). POCT Glucose - 03/31/18 1256      POCT Blood Glucose   Pre-Exercise  137 mg/dL    Post-Exercise  92 mg/dL      Exercise Prescription Changes - 03/31/18 1200      Response to Exercise   Blood Pressure (Admit)  130/80    Blood Pressure (Exercise)  120/84    Blood Pressure (Exit)  122/80    Heart Rate (Admit)  72 bpm    Heart Rate (Exercise)  90 bpm    Heart Rate (Exit)  81 bpm    Oxygen Saturation (Admit)  96 %    Oxygen Saturation (Exercise)  92 %    Oxygen Saturation (Exit)  94 %    Rating of Perceived Exertion (Exercise)  13    Perceived Dyspnea (Exercise)  2    Duration  Progress to 45 minutes of aerobic exercise without signs/symptoms of physical distress    Intensity  Other (comment)   40-80% of HRR     Progression   Progression  Continue to progress workloads to  maintain intensity without signs/symptoms of physical distress.      Resistance Training   Training Prescription  Yes    Weight  orange bands    Reps  10-15    Time  10 Minutes      Oxygen   Oxygen  Continuous    Liters  4      Recumbant Bike   Level  2    Watts  10    Minutes  17      NuStep   Level  3    SPM  80    Minutes  17    METs  1.7      Track   Laps  6    Minutes  17       Social History   Tobacco Use  Smoking Status Former Smoker  . Types: Cigarettes  . Last attempt to quit: 2003  . Years since quitting: 16.7  Smokeless Tobacco Never Used    Goals Met:  Exercise tolerated well  Goals Unmet:  Not Applicable  Comments: Service time is from 10:30a to  12:10p    Dr. Rush Farmer is Medical Director for Pulmonary Rehab at [facility].

## 2018-04-01 NOTE — Progress Notes (Addendum)
Pulmonary Individual Treatment Plan  Patient Details  Name: Rebecca [name] MRN: [id number] Date of Birth: 1941-04-19 Referring Provider:     Pulmonary Rehab Walk Test from 03/05/2018 in [place]  Referring Provider  Elsworth Soho      Initial Encounter Date:    Pulmonary Rehab Walk Test from 03/05/2018 in [place]  Date  03/05/18      Visit Diagnosis: Chronic respiratory failure with hypoxia ([place])  Patient's Home Medications on Admission:  Current Outpatient Medications:  .  albuterol (PROVENTIL HFA;VENTOLIN HFA) 108 (90 Base) MCG/ACT inhaler, Inhale 1 puff into the lungs every 6 (six) hours as needed for wheezing or shortness of breath., Disp: , Rfl:  .  Calcium Carbonate-Vitamin D (CALCIUM 600+D) 600-200 MG-UNIT TABS, Take by mouth., Disp: , Rfl:  .  carvedilol (COREG) 25 MG tablet, 2 (two) times daily. , Disp: , Rfl:  .  imipramine (TOFRANIL) 10 MG tablet, , Disp: , Rfl:  .  metFORMIN (GLUCOPHAGE) 500 MG tablet, daily with supper. , Disp: , Rfl:  .  rosuvastatin (CRESTOR) 5 MG tablet, , Disp: , Rfl:  .  umeclidinium-vilanterol (ANORO ELLIPTA) 62.5-25 MCG/INH AEPB, Inhale 1 puff into the lungs daily., Disp: 1 each, Rfl: 3  Past Medical History: Past Medical History:  Diagnosis Date  . Congestive heart disease ([place])   . COPD (chronic obstructive pulmonary disease) ([place])   . Diabetes ([place])   . Hyperlipemia   . Osteoporosis   . Primary hypertension     Tobacco Use: Social History   Tobacco Use  Smoking Status Former Smoker  . Types: Cigarettes  . Last attempt to quit: 2003  . Years since quitting: 16.7  Smokeless Tobacco Never Used    Labs: Recent Review Flowsheet Data    There is no flowsheet data to display.      Capillary Blood Glucose: No results found for: GLUCAP POCT Glucose    Row Name 03/17/18 1252 03/31/18 1256           POCT Blood Glucose   Pre-Exercise  105 mg/dL  137 mg/dL       Post-Exercise  115 mg/dL  92 mg/dL         Exercise Target Goals: Exercise Program Goal: Individual exercise prescription set using results from initial 6 min walk test and THRR while considering  patient's activity barriers and safety.   Exercise Prescription Goal: Initial exercise prescription builds to 30-45 minutes a day of aerobic activity, 2-3 days per week.  Home exercise guidelines will be given to patient during program as part of exercise prescription that the participant will acknowledge.  Activity Barriers & Risk Stratification: Activity Barriers & Cardiac Risk Stratification - 02/27/18 1027      Activity Barriers & Cardiac Risk Stratification   Activity Barriers  Back Problems;History of Falls;Shortness of Breath;Deconditioning;Assistive Device;Joint Problems   Right knee cap surgery   Cardiac Risk Stratification  High       6 Minute Walk: 6 Minute Walk    Row Name 03/05/18 1306         6 Minute Walk   Phase  Initial     Distance  1000 feet     Walk Time  - 5 minutes 30 seconds     # of Rest Breaks  1 30 second break standing     MPH  1.89     METS  2.38     RPE  12  Perceived Dyspnea   3     Symptoms  No     Resting HR  78 bpm     Resting BP  124/66     Resting Oxygen Saturation   97 %     Exercise Oxygen Saturation  during 6 min walk  90 %     Max Ex. HR  97 bpm     Max Ex. BP  124/84       Interval HR   1 Minute HR  84     2 Minute HR  91     3 Minute HR  95     4 Minute HR  97     5 Minute HR  94     6 Minute HR  94     2 Minute Post HR  85     Interval Heart Rate?  Yes       Interval Oxygen   Interval Oxygen?  Yes     Baseline Oxygen Saturation %  97 %     1 Minute Oxygen Saturation %  97 %     1 Minute Liters of Oxygen  4 L     2 Minute Oxygen Saturation %  92 %     2 Minute Liters of Oxygen  4 L     3 Minute Oxygen Saturation %  90 %     3 Minute Liters of Oxygen  4 L     4 Minute Oxygen Saturation %  93 %     4 Minute Liters of  Oxygen  4 L     5 Minute Oxygen Saturation %  94 %     5 Minute Liters of Oxygen  4 L     6 Minute Oxygen Saturation %  94 %     6 Minute Liters of Oxygen  4 L     2 Minute Post Oxygen Saturation %  100 %     2 Minute Post Liters of Oxygen  4 L        Oxygen Initial Assessment: Oxygen Initial Assessment - 03/05/18 1311      Initial 6 min Walk   Oxygen Used  Continuous;E-Tanks    Liters per minute  4      Program Oxygen Prescription   Program Oxygen Prescription  Continuous;E-Tanks    Liters per minute  4       Oxygen Re-Evaluation: Oxygen Re-Evaluation    Row Name 03/30/18 1631             Program Oxygen Prescription   Program Oxygen Prescription  Continuous;E-Tanks       Liters per minute  4         Home Oxygen   Home Oxygen Device  E-Tanks;Home Concentrator       Sleep Oxygen Prescription  Continuous       Liters per minute  4       Home Exercise Oxygen Prescription  Pulsed       Liters per minute  4       Home at Rest Exercise Oxygen Prescription  Pulsed       Liters per minute  3       Compliance with Home Oxygen Use  Yes         Goals/Expected Outcomes   Short Term Goals  To learn and exhibit compliance with exercise, home and travel O2 prescription;To learn and understand importance of monitoring SPO2 with pulse oximeter  and demonstrate accurate use of the pulse oximeter.;To learn and understand importance of maintaining oxygen saturations>88%;To learn and demonstrate proper pursed lip breathing techniques or other breathing techniques.;To learn and demonstrate proper use of respiratory medications       Long  Term Goals  Exhibits compliance with exercise, home and travel O2 prescription;Verbalizes importance of monitoring SPO2 with pulse oximeter and return demonstration;Maintenance of O2 saturations>88%;Exhibits proper breathing techniques, such as pursed lip breathing or other method taught during program session;Compliance with respiratory  medication;Demonstrates proper use of MDI's       Goals/Expected Outcomes  compliance          Oxygen Discharge (Final Oxygen Re-Evaluation): Oxygen Re-Evaluation - 03/30/18 1631      Program Oxygen Prescription   Program Oxygen Prescription  Continuous;E-Tanks    Liters per minute  4      Home Oxygen   Home Oxygen Device  E-Tanks;Home Concentrator    Sleep Oxygen Prescription  Continuous    Liters per minute  4    Home Exercise Oxygen Prescription  Pulsed    Liters per minute  4    Home at Rest Exercise Oxygen Prescription  Pulsed    Liters per minute  3    Compliance with Home Oxygen Use  Yes      Goals/Expected Outcomes   Short Term Goals  To learn and exhibit compliance with exercise, home and travel O2 prescription;To learn and understand importance of monitoring SPO2 with pulse oximeter and demonstrate accurate use of the pulse oximeter.;To learn and understand importance of maintaining oxygen saturations>88%;To learn and demonstrate proper pursed lip breathing techniques or other breathing techniques.;To learn and demonstrate proper use of respiratory medications    Long  Term Goals  Exhibits compliance with exercise, home and travel O2 prescription;Verbalizes importance of monitoring SPO2 with pulse oximeter and return demonstration;Maintenance of O2 saturations>88%;Exhibits proper breathing techniques, such as pursed lip breathing or other method taught during program session;Compliance with respiratory medication;Demonstrates proper use of MDI's    Goals/Expected Outcomes  compliance       Initial Exercise Prescription: Initial Exercise Prescription - 03/05/18 1300      Date of Initial Exercise RX and Referring Provider   Date  03/05/18    Referring Provider  Elsworth Soho      Oxygen   Oxygen  Continuous    Liters  4      Recumbant Bike   Level  2    Watts  10    Minutes  17      NuStep   Level  2    SPM  80    Minutes  17    METs  1.5      Track   Laps  5     Minutes  17      Prescription Details   Frequency (times per week)  2    Duration  Progress to 45 minutes of aerobic exercise without signs/symptoms of physical distress      Intensity   THRR 40-80% of Max Heartrate  57-114    Ratings of Perceived Exertion  11-13    Perceived Dyspnea  0-4      Progression   Progression  Continue progressive overload as per policy without signs/symptoms or physical distress.      Resistance Training   Training Prescription  Yes    Weight  orange bands    Reps  10-15       Perform Capillary Blood Glucose checks  as needed.  Exercise Prescription Changes: Exercise Prescription Changes    Row Name 03/17/18 1200 03/31/18 1200           Response to Exercise   Blood Pressure (Admit)  134/70  130/80      Blood Pressure (Exercise)  150/80  120/84      Blood Pressure (Exit)  122/62  122/80      Heart Rate (Admit)  72 bpm  72 bpm      Heart Rate (Exercise)  87 bpm  90 bpm      Heart Rate (Exit)  79 bpm  81 bpm      Oxygen Saturation (Admit)  97 %  96 %      Oxygen Saturation (Exercise)  94 %  92 %      Oxygen Saturation (Exit)  99 %  94 %      Rating of Perceived Exertion (Exercise)  13  13      Perceived Dyspnea (Exercise)  2  2      Duration  Progress to 45 minutes of aerobic exercise without signs/symptoms of physical distress  Progress to 45 minutes of aerobic exercise without signs/symptoms of physical distress      Intensity  Other (comment) 40-80% of HRR  Other (comment) 40-80% of HRR        Progression   Progression  Continue to progress workloads to maintain intensity without signs/symptoms of physical distress.  Continue to progress workloads to maintain intensity without signs/symptoms of physical distress.        Resistance Training   Training Prescription  Yes  Yes      Weight  orange bands  orange bands      Reps  10-15  10-15      Time  10 Minutes  10 Minutes        Oxygen   Oxygen  Continuous  Continuous      Liters  4  4         Recumbant Bike   Level  2  2      Watts  10  10      Minutes  17  17        NuStep   Level  2  3      SPM  80  80      Minutes  17  17      METs  1.4  1.7        Track   Laps  5  6      Minutes  17  17         Exercise Comments:   Exercise Goals and Review: Exercise Goals    Row Name 02/27/18 1029             Exercise Goals   Increase Physical Activity  Yes       Intervention  Provide advice, education, support and counseling about physical activity/exercise needs.;Develop an individualized exercise prescription for aerobic and resistive training based on initial evaluation findings, risk stratification, comorbidities and participant's personal goals.       Expected Outcomes  Short Term: Attend rehab on a regular basis to increase amount of physical activity.;Long Term: Add in home exercise to make exercise part of routine and to increase amount of physical activity.;Long Term: Exercising regularly at least 3-5 days a week.       Increase Strength and Stamina  Yes       Intervention  Provide  advice, education, support and counseling about physical activity/exercise needs.;Develop an individualized exercise prescription for aerobic and resistive training based on initial evaluation findings, risk stratification, comorbidities and participant's personal goals.       Expected Outcomes  Short Term: Increase workloads from initial exercise prescription for resistance, speed, and METs.;Short Term: Perform resistance training exercises routinely during rehab and add in resistance training at home;Long Term: Improve cardiorespiratory fitness, muscular endurance and strength as measured by increased METs and functional capacity (6MWT)       Able to understand and use rate of perceived exertion (RPE) scale  Yes       Intervention  Provide education and explanation on how to use RPE scale       Expected Outcomes  Short Term: Able to use RPE daily in rehab to express subjective  intensity level;Long Term:  Able to use RPE to guide intensity level when exercising independently       Able to understand and use Dyspnea scale  Yes       Intervention  Provide education and explanation on how to use Dyspnea scale       Expected Outcomes  Short Term: Able to use Dyspnea scale daily in rehab to express subjective sense of shortness of breath during exertion;Long Term: Able to use Dyspnea scale to guide intensity level when exercising independently       Knowledge and understanding of Target Heart Rate Range (THRR)  Yes       Intervention  Provide education and explanation of THRR including how the numbers were predicted and where they are located for reference       Expected Outcomes  Short Term: Able to state/look up THRR;Short Term: Able to use daily as guideline for intensity in rehab;Long Term: Able to use THRR to govern intensity when exercising independently       Understanding of Exercise Prescription  Yes       Intervention  Provide education, explanation, and written materials on patient's individual exercise prescription       Expected Outcomes  Short Term: Able to explain program exercise prescription;Long Term: Able to explain home exercise prescription to exercise independently          Exercise Goals Re-Evaluation : Exercise Goals Re-Evaluation    [place] Name 03/30/18 1632             Exercise Goal Re-Evaluation   Exercise Goals Review  Increase Physical Activity;Increase Strength and Stamina;Able to understand and use rate of perceived exertion (RPE) scale;Able to understand and use Dyspnea scale;Knowledge and understanding of Target Heart Rate Range (THRR);Understanding of Exercise Prescription       Comments  Patient has only attended 5 rehab sessions. Patient's progress is going to be slow. However, she started off walking 4 laps (200 ft each) in 15 minutes to now 8 laps. MET average places her in a low level. Will cont. to monitor and motivate.       Expected  Outcomes  Through exercise at rehab and at home, the patient will decrease shortness of breath with daily activities and feel confident in carrying out an exercise regime at home.           Discharge Exercise Prescription (Final Exercise Prescription Changes): Exercise Prescription Changes - 03/31/18 1200      Response to Exercise   Blood Pressure (Admit)  130/80    Blood Pressure (Exercise)  120/84    Blood Pressure (Exit)  122/80    Heart Rate (  Admit)  72 bpm    Heart Rate (Exercise)  90 bpm    Heart Rate (Exit)  81 bpm    Oxygen Saturation (Admit)  96 %    Oxygen Saturation (Exercise)  92 %    Oxygen Saturation (Exit)  94 %    Rating of Perceived Exertion (Exercise)  13    Perceived Dyspnea (Exercise)  2    Duration  Progress to 45 minutes of aerobic exercise without signs/symptoms of physical distress    Intensity  Other (comment)   40-80% of HRR     Progression   Progression  Continue to progress workloads to maintain intensity without signs/symptoms of physical distress.      Resistance Training   Training Prescription  Yes    Weight  orange bands    Reps  10-15    Time  10 Minutes      Oxygen   Oxygen  Continuous    Liters  4      Recumbant Bike   Level  2    Watts  10    Minutes  17      NuStep   Level  3    SPM  80    Minutes  17    METs  1.7      Track   Laps  6    Minutes  17       Nutrition:  Target Goals: Understanding of nutrition guidelines, daily intake of sodium <1562m, cholesterol <2067m calories 30% from fat and 7% or less from saturated fats, daily to have 5 or more servings of fruits and vegetables.  Biometrics:    Nutrition Therapy Plan and Nutrition Goals: Nutrition Therapy & Goals - 03/03/18 0938      Nutrition Therapy   Diet  general healthful      Personal Nutrition Goals   Nutrition Goal  Identify food quantities necessary to achieve wt loss of  -2# per week to a goal wt loss of 6-10 lbs at graduation from pulmonary  rehab.    Personal Goal #2  Describe the benefit of including fruits, vegetables, whole grains, and low-fat dairy products in a healthy meal plan.      Intervention Plan   Intervention  Prescribe, educate and counsel regarding individualized specific dietary modifications aiming towards targeted core components such as weight, hypertension, lipid management, diabetes, heart failure and other comorbidities.    Expected Outcomes  Short Term Goal: Understand basic principles of dietary content, such as calories, fat, sodium, cholesterol and nutrients.       Nutrition Assessments: Nutrition Assessments - 03/03/18 0941      Rate Your Plate Scores   Pre Score  50       Nutrition Goals Re-Evaluation: Nutrition Goals Re-Evaluation    [place] 03/03/18 0938             Goals   Current Weight  160 lb 15 oz (73 kg)          Nutrition Goals Re-Evaluation: Nutrition Goals Re-Evaluation    [place] 03/03/18 0938             Goals   Current Weight  160 lb 15 oz (73 kg)          Nutrition Goals Discharge (Final Nutrition Goals Re-Evaluation): Nutrition Goals Re-Evaluation - 03/03/18 0938      Goals   Current Weight  160 lb 15 oz (73 kg)       Psychosocial: Target  Goals: Acknowledge presence or absence of significant depression and/or stress, maximize coping skills, provide positive support system. Participant is able to verbalize types and ability to use techniques and skills needed for reducing stress and depression.  Initial Review & Psychosocial Screening: Initial Psych Review & Screening - 02/27/18 1036      Initial Review   Current issues with  History of Depression      Family Dynamics   Good Support System?  Yes   moved to [place] to be near children and grandchildren   Comments  support of family and freinds at the independent living      Barriers   Psychosocial barriers to participate in program  There are no identifiable barriers or psychosocial  needs.      Screening Interventions   Interventions  Encouraged to exercise    Expected Outcomes  Short Term goal: Identification and review with participant of any Quality of Life or Depression concerns found by scoring the questionnaire.;Long Term goal: The participant improves quality of Life and PHQ9 Scores as seen by post scores and/or verbalization of changes       Quality of Life Scores:  Scores of 19 and below usually indicate a poorer quality of life in these areas.  A difference of  2-3 points is a clinically meaningful difference.  A difference of 2-3 points in the total score of the Quality of Life Index has been associated with significant improvement in overall quality of life, self-image, physical symptoms, and general health in studies assessing change in quality of life.  PHQ-9: Recent Review Flowsheet Data    Depression screen [facility] 2/9 02/27/2018   Decreased Interest 0   Down, Depressed, Hopeless 0   PHQ - 2 Score 0   Altered sleeping 0   Tired, decreased energy 1   Change in appetite 0   Feeling bad or failure about yourself  0   Trouble concentrating 0   Moving slowly or fidgety/restless 0   Suicidal thoughts 0   PHQ-9 Score 1   Difficult doing work/chores Not difficult at all     Interpretation of Total Score  Total Score Depression Severity:  1-4 = Minimal depression, 5-9 = Mild depression, 10-14 = Moderate depression, 15-19 = Moderately severe depression, 20-27 = Severe depression   Psychosocial Evaluation and Intervention: Psychosocial Evaluation - 04/01/18 1539      Psychosocial Evaluation & Interventions   Interventions  Relaxation education;Stress management education;Encouraged to exercise with the program and follow exercise prescription    Comments  no identifiable barriers Pt is quiet by nature but will engage in conversation when asked questions that require feedback    Expected Outcomes  Continue to maintain sense well being     Continue  Psychosocial Services   Follow up required by staff       Psychosocial Re-Evaluation: Psychosocial Re-Evaluation    [place] Name 04/01/18 1540             Psychosocial Re-Evaluation   Current issues with  History of Depression       Comments  no identifiable barriers.  Pt is very happy at her assisted living facility and has made lots of freinds       Expected Outcomes  Pt will continue to display mental well being with positve and realistic outlook of her future       Interventions  Encouraged to attend Pulmonary Rehabilitation for the exercise;Stress management education;Relaxation education       Continue  Psychosocial Services   Follow up required by staff          Psychosocial Discharge (Final Psychosocial Re-Evaluation): Psychosocial Re-Evaluation - 04/01/18 1540      Psychosocial Re-Evaluation   Current issues with  History of Depression    Comments  no identifiable barriers.  Pt is very happy at her assisted living facility and has made lots of freinds    Expected Outcomes  Pt will continue to display mental well being with positve and realistic outlook of her future    Interventions  Encouraged to attend Pulmonary Rehabilitation for the exercise;Stress management education;Relaxation education    Continue Psychosocial Services   Follow up required by staff       Vocational Rehabilitation: Provide vocational rehab assistance to qualifying candidates.   Vocational Rehab Evaluation & Intervention: Vocational Rehab - 02/27/18 1040      Initial Vocational Rehab Evaluation & Intervention   Assessment shows need for Vocational Rehabilitation  No       Education: Education Goals: Education classes will be provided on a weekly basis, covering required topics. Participant will state understanding/return demonstration of topics presented.  Learning Barriers/Preferences: Learning Barriers/Preferences - 02/27/18 1039      Learning Barriers/Preferences   Learning Barriers   Sight    Learning Preferences  Skilled Demonstration;Group Instruction;Individual Instruction;Audio;Written Material       Education Topics: Count Your Pulse:  -Group instruction provided by verbal instruction, demonstration, patient participation and written materials to support subject.  Instructors address importance of being able to find your pulse and how to count your pulse when at home without a heart monitor.  Patients get hands on experience counting their pulse with staff help and individually.   Heart Attack, Angina, and Risk Factor Modification:  -Group instruction provided by verbal instruction, video, and written materials to support subject.  Instructors address signs and symptoms of angina and heart attacks.    Also discuss risk factors for heart disease and how to make changes to improve heart health risk factors.   Functional Fitness:  -Group instruction provided by verbal instruction, demonstration, patient participation, and written materials to support subject.  Instructors address safety measures for doing things around the house.  Discuss how to get up and down off the floor, how to pick things up properly, how to safely get out of a chair without assistance, and balance training.   Meditation and Mindfulness:  -Group instruction provided by verbal instruction, patient participation, and written materials to support subject.  Instructor addresses importance of mindfulness and meditation practice to help reduce stress and improve awareness.  Instructor also leads participants through a meditation exercise.    Stretching for Flexibility and Mobility:  -Group instruction provided by verbal instruction, patient participation, and written materials to support subject.  Instructors lead participants through series of stretches that are designed to increase flexibility thus improving mobility.  These stretches are additional exercise for major muscle groups that are typically  performed during regular warm up and cool down.   Hands Only CPR:  -Group verbal, video, and participation provides a basic overview of AHA guidelines for community CPR. Role-play of emergencies allow participants the opportunity to practice calling for help and chest compression technique with discussion of AED use.   Hypertension: -Group verbal and written instruction that provides a basic overview of hypertension including the most recent diagnostic guidelines, risk factor reduction with self-care instructions and medication management.    Nutrition I class: Heart Healthy Eating:  -Group  instruction provided by PowerPoint slides, verbal discussion, and written materials to support subject matter. The instructor gives an explanation and review of the Therapeutic Lifestyle Changes diet recommendations, which includes a discussion on lipid goals, dietary fat, sodium, fiber, plant stanol/sterol esters, sugar, and the components of a well-balanced, healthy diet.   Nutrition II class: Lifestyle Skills:  -Group instruction provided by PowerPoint slides, verbal discussion, and written materials to support subject matter. The instructor gives an explanation and review of label reading, grocery shopping for heart health, heart healthy recipe modifications, and ways to make healthier choices when eating out.   Diabetes Question & Answer:  -Group instruction provided by PowerPoint slides, verbal discussion, and written materials to support subject matter. The instructor gives an explanation and review of diabetes co-morbidities, pre- and post-prandial blood glucose goals, pre-exercise blood glucose goals, signs, symptoms, and treatment of hypoglycemia and hyperglycemia, and foot care basics.   Diabetes Blitz:  -Group instruction provided by PowerPoint slides, verbal discussion, and written materials to support subject matter. The instructor gives an explanation and review of the physiology behind  type 1 and type 2 diabetes, diabetes medications and rational behind using different medications, pre- and post-prandial blood glucose recommendations and Hemoglobin A1c goals, diabetes diet, and exercise including blood glucose guidelines for exercising safely.    Portion Distortion:  -Group instruction provided by PowerPoint slides, verbal discussion, written materials, and food models to support subject matter. The instructor gives an explanation of serving size versus portion size, changes in portions sizes over the last 20 years, and what consists of a serving from each food group.   Stress Management:  -Group instruction provided by verbal instruction, video, and written materials to support subject matter.  Instructors review role of stress in heart disease and how to cope with stress positively.     Exercising on Your Own:  -Group instruction provided by verbal instruction, power point, and written materials to support subject.  Instructors discuss benefits of exercise, components of exercise, frequency and intensity of exercise, and end points for exercise.  Also discuss use of nitroglycerin and activating EMS.  Review options of places to exercise outside of rehab.  Review guidelines for sex with heart disease.   Cardiac Drugs I:  -Group instruction provided by verbal instruction and written materials to support subject.  Instructor reviews cardiac drug classes: antiplatelets, anticoagulants, beta blockers, and statins.  Instructor discusses reasons, side effects, and lifestyle considerations for each drug class.   Cardiac Drugs II:  -Group instruction provided by verbal instruction and written materials to support subject.  Instructor reviews cardiac drug classes: angiotensin converting enzyme inhibitors (ACE-I), angiotensin II receptor blockers (ARBs), nitrates, and calcium channel blockers.  Instructor discusses reasons, side effects, and lifestyle considerations for each drug  class.   Anatomy and Physiology of the Circulatory System:  Group verbal and written instruction and models provide basic cardiac anatomy and physiology, with the coronary electrical and arterial systems. Review of: AMI, Angina, Valve disease, Heart Failure, Peripheral Artery Disease, Cardiac Arrhythmia, Pacemakers, and the ICD.   Other Education:  -Group or individual verbal, written, or video instructions that support the educational goals of the cardiac rehab program.   Holiday Eating Survival Tips:  -Group instruction provided by PowerPoint slides, verbal discussion, and written materials to support subject matter. The instructor gives patients tips, tricks, and techniques to help them not only survive but enjoy the holidays despite the onslaught of food that accompanies the holidays.   Knowledge Questionnaire Score:  Knowledge Questionnaire Score - 03/04/18 1518      Knowledge Questionnaire Score   Pre Score  14/18       Core Components/Risk Factors/Patient Goals at Admission: Personal Goals and Risk Factors at Admission - 02/27/18 1040      Core Components/Risk Factors/Patient Goals on Admission    Weight Management  Yes;Weight Loss    Intervention  Weight Management: Develop a combined nutrition and exercise program designed to reach desired caloric intake, while maintaining appropriate intake of nutrient and fiber, sodium and fats, and appropriate energy expenditure required for the weight goal.;Weight Management: Provide education and appropriate resources to help participant work on and attain dietary goals.;Obesity: Provide education and appropriate resources to help participant work on and attain dietary goals.    Admit Weight  160 lb 7.9 oz (72.8 kg)    Goal Weight: Short Term  155 lb (70.3 kg)    Goal Weight: Long Term  150 lb (68 kg)    Expected Outcomes  Understanding of distribution of calorie intake throughout the day with the consumption of 4-5  meals/snacks;Understanding recommendations for meals to include 15-35% energy as protein, 25-35% energy from fat, 35-60% energy from carbohydrates, less than '200mg'$  of dietary cholesterol, 20-35 gm of total fiber daily;Weight Loss: Understanding of general recommendations for a balanced deficit meal plan, which promotes 1-2 lb weight loss per week and includes a negative energy balance of [phone] kcal/d;Long Term: Adherence to nutrition and physical activity/exercise program aimed toward attainment of established weight goal;Short Term: Continue to assess and modify interventions until short term weight is achieved    Improve shortness of breath with ADL's  Yes    Intervention  Provide education, individualized exercise plan and daily activity instruction to help decrease symptoms of SOB with activities of daily living.    Expected Outcomes  Short Term: Improve cardiorespiratory fitness to achieve a reduction of symptoms when performing ADLs;Long Term: Be able to perform more ADLs without symptoms or delay the onset of symptoms    Diabetes  Yes    Intervention  Provide education about signs/symptoms and action to take for hypo/hyperglycemia.;Provide education about proper nutrition, including hydration, and aerobic/resistive exercise prescription along with prescribed medications to achieve blood glucose in normal ranges: Fasting glucose 65-99 mg/dL    Expected Outcomes  Short Term: Participant verbalizes understanding of the signs/symptoms and immediate care of hyper/hypoglycemia, proper foot care and importance of medication, aerobic/resistive exercise and nutrition plan for blood glucose control.;Long Term: Attainment of HbA1C < 7%.    Hypertension  Yes    Intervention  Provide education on lifestyle modifcations including regular physical activity/exercise, weight management, moderate sodium restriction and increased consumption of fresh fruit, vegetables, and low fat dairy, alcohol moderation, and  smoking cessation.;Monitor prescription use compliance.    Expected Outcomes  Short Term: Continued assessment and intervention until BP is < 140/56m HG in hypertensive participants. < 130/823mHG in hypertensive participants with diabetes, heart failure or chronic kidney disease.;Long Term: Maintenance of blood pressure at goal levels.    Lipids  Yes    Intervention  Provide education and support for participant on nutrition & aerobic/resistive exercise along with prescribed medications to achieve LDL '70mg'$ , HDL >'40mg'$ .    Expected Outcomes  Short Term: Participant states understanding of desired cholesterol values and is compliant with medications prescribed. Participant is following exercise prescription and nutrition guidelines.;Long Term: Cholesterol controlled with medications as prescribed, with individualized exercise RX and with personalized nutrition plan. Value goals: LDL <  25m, HDL > 40 mg.       Core Components/Risk Factors/Patient Goals Review:  Goals and Risk Factor Review    Row Name 04/01/18 1541             Core Components/Risk Factors/Patient Goals Review   Personal Goals Review  Weight Management/Obesity;Develop more efficient breathing techniques such as purse lipped breathing and diaphragmatic breathing and practicing self-pacing with activity.;Increase knowledge of respiratory medications and ability to use respiratory devices properly.;Improve shortness of breath with ADL's;Diabetes;Lipids       Review  Pt has completed 6 exercise sessions.  Pt with wt gain of .7 kg.  Hopefully we will see an improvement as pt is able to be more active on off days from rehab.  Pt completed respiratory medication class and will engage in PLB with verbal cues from staff.   Pt reports CBG well within normal limits to support exercise safely.   Pt rates her shortness of breath 1-2 level for dypnea.  Pt reports her lipid readings are in acceptable range, will resolve this goal.   Pt workloads  nustep increased to level 3,  recumbent  level 2 and averages 6-8 laps around the track.  Continue to montior pt progress during the next 30 day assessment       Expected Outcomes  See Admission Goals/Outcomes          Core Components/Risk Factors/Patient Goals at Discharge (Final Review):  Goals and Risk Factor Review - 04/01/18 1541      Core Components/Risk Factors/Patient Goals Review   Personal Goals Review  Weight Management/Obesity;Develop more efficient breathing techniques such as purse lipped breathing and diaphragmatic breathing and practicing self-pacing with activity.;Increase knowledge of respiratory medications and ability to use respiratory devices properly.;Improve shortness of breath with ADL's;Diabetes;Lipids    Review  Pt has completed 6 exercise sessions.  Pt with wt gain of .7 kg.  Hopefully we will see an improvement as pt is able to be more active on off days from rehab.  Pt completed respiratory medication class and will engage in PLB with verbal cues from staff.   Pt reports CBG well within normal limits to support exercise safely.   Pt rates her shortness of breath 1-2 level for dypnea.  Pt reports her lipid readings are in acceptable range, will resolve this goal.   Pt workloads nustep increased to level 3,  recumbent  level 2 and averages 6-8 laps around the track.  Continue to montior pt progress during the next 30 day assessment    Expected Outcomes  See Admission Goals/Outcomes       ITP Comments: ITP Comments    Row Name 02/27/18 1006 04/01/18 1539         ITP Comments  Dr. WJennet Maduro Medical Director  Dr. WJennet Maduro Medical Director Pulmonary Rehab         Comments:Pt completed 6 exercise sessions. Continue to monitor pt progress. CCherre Huger BSN Cardiac and PTraining and development officer

## 2018-04-02 ENCOUNTER — Encounter (HOSPITAL_COMMUNITY)
Admission: RE | Admit: 2018-04-02 | Discharge: 2018-04-02 | Disposition: A | Discharge status: Home / Self Care | Payer: Medicare HMO | Source: Ambulatory Visit | Attending: Pulmonary Disease | Admitting: Pulmonary Disease

## 2018-04-02 DIAGNOSIS — J9611 Chronic respiratory failure with hypoxia: Secondary | ICD-10-CM

## 2018-04-02 NOTE — Progress Notes (Signed)
Daily Session Note  Patient Details  Name: Rebecca [name] MRN: [id number] Date of Birth: [birth year] Referring Provider:     Pulmonary Rehab Walk Test from 03/05/2018 in [place]  Referring Provider  Elsworth Soho      Encounter Date: 04/02/2018  Check In: Session Check In - 04/02/18 1227      Check-In   Supervising physician immediately available to respond to emergencies  [facility] immediately available    Physician(s)  Dr. Toma Copier    Location  [facility]    Staff Present  Maurice Small, RN, BSN;Molly DiVincenzo, MS, ACSM RCEP, Exercise Physiologist;Syaire Saber Ysidro Evert, Felipe Drone, RN, MHA    Medication changes reported      No    Fall or balance concerns reported     No    Tobacco Cessation  No Change    Warm-up and Cool-down  Performed as group-led instruction    Resistance Training Performed  Yes    VAD Patient?  No    PAD/SET Patient?  No      Pain Assessment   Currently in Pain?  No/denies    Pain Score  0-No pain    Multiple Pain Sites  No       Capillary Blood Glucose: No results found for this or any previous visit (from the past 24 hour(s)).    Social History   Tobacco Use  Smoking Status Former Smoker  . Types: Cigarettes  . Last attempt to quit: 2003  . Years since quitting: 16.7  Smokeless Tobacco Never Used    Goals Met:  Exercise tolerated well No report of cardiac concerns or symptoms Strength training completed today  Goals Unmet:  Not Applicable  Comments: Service time is from 1030 to 1220    Dr. Rush Farmer is Medical Director for Pulmonary Rehab at [facility].

## 2018-04-03 NOTE — Progress Notes (Signed)
I have reviewed a Home Exercise Prescription with Rebecca [name] . Rebecca [name] is not currently exercising at home.  The patient was advised to walk 2 days a week for 30 minutes.  Rebecca [name] and I discussed how to progress their exercise prescription.  The patient stated that their goals were to increase endurance, increase stamina, decrease shortness of breath, be able to go sight seeing again with enjoyment and not trepidation.  The patient stated that they understand the exercise prescription.  We reviewed exercise guidelines, target heart rate during exercise, RPE Scale, weather conditions, NTG use, endpoints for exercise, warmup and cool down.  Patient is encouraged to come to me with any questions. I will continue to follow up with the patient to assist them with progression and safety.

## 2018-04-07 ENCOUNTER — Encounter (HOSPITAL_COMMUNITY)
Admission: RE | Admit: 2018-04-07 | Discharge: 2018-04-07 | Disposition: A | Discharge status: Home / Self Care | Payer: Medicare HMO | Source: Ambulatory Visit | Attending: Pulmonary Disease | Admitting: Pulmonary Disease

## 2018-04-07 DIAGNOSIS — J9611 Chronic respiratory failure with hypoxia: Secondary | ICD-10-CM

## 2018-04-07 NOTE — Progress Notes (Signed)
Daily Session Note  Patient Details  Name: Rebecca [name] MRN: [id number] Date of Birth: 1940/09/07 Referring Provider:     Pulmonary Rehab Walk Test from 03/05/2018 in [place]  Referring Provider  Elsworth Soho      Encounter Date: 04/07/2018  Check In: Session Check In - 04/07/18 1021      Check-In   Supervising physician immediately available to respond to emergencies  [facility] immediately available    Physician(s)  Dr. Loleta Books    Location  [facility]    Staff Present  Maurice Small, RN, BSN;Elna Radovich, MS, ACSM RCEP, Exercise Physiologist;Annedrea Stackhouse, RN, [place]    Medication changes reported      No    Fall or balance concerns reported     No    Tobacco Cessation  No Change    Warm-up and Cool-down  Performed as group-led instruction    Resistance Training Performed  Yes    VAD Patient?  No    PAD/SET Patient?  No      Pain Assessment   Currently in Pain?  No/denies    Multiple Pain Sites  No       Capillary Blood Glucose: No results found for this or any previous visit (from the past 24 hour(s)).    Social History   Tobacco Use  Smoking Status Former Smoker  . Types: Cigarettes  . Last attempt to quit: 2003  . Years since quitting: 16.7  Smokeless Tobacco Never Used    Goals Met:  Exercise tolerated well  Goals Unmet:  Not Applicable  Comments: Service time is from 10:30a to 12:30p    Dr. Rush Farmer is Medical Director for Pulmonary Rehab at [facility].

## 2018-04-08 DIAGNOSIS — J432 Centrilobular emphysema: Secondary | ICD-10-CM | POA: Diagnosis not present

## 2018-04-08 DIAGNOSIS — J9611 Chronic respiratory failure with hypoxia: Secondary | ICD-10-CM | POA: Diagnosis not present

## 2018-04-08 DIAGNOSIS — J449 Chronic obstructive pulmonary disease, unspecified: Secondary | ICD-10-CM | POA: Diagnosis not present

## 2018-04-09 ENCOUNTER — Encounter (HOSPITAL_COMMUNITY)
Admission: RE | Admit: 2018-04-09 | Discharge: 2018-04-09 | Disposition: A | Discharge status: Home / Self Care | Payer: Medicare HMO | Source: Ambulatory Visit | Attending: Pulmonary Disease | Admitting: Pulmonary Disease

## 2018-04-09 VITALS — Wt 163.8 lb

## 2018-04-09 DIAGNOSIS — J9611 Chronic respiratory failure with hypoxia: Secondary | ICD-10-CM | POA: Diagnosis not present

## 2018-04-09 NOTE — Progress Notes (Signed)
Daily Session Note  Patient Details  Name: Rebecca [name] MRN: [id number] Date of Birth: December 16, 1940 Referring Provider:     Pulmonary Rehab Walk Test from 03/05/2018 in [place]  Referring Provider  Elsworth Soho      Encounter Date: 04/09/2018  Check In: Session Check In - 04/09/18 1016      Check-In   Supervising physician immediately available to respond to emergencies  [facility] immediately available    Physician(s)  Dr. Loleta Books    Location  [facility]    Staff Present  Maurice Small, RN, BSN;Dorotha Hirschi, MS, ACSM RCEP, Exercise Physiologist;Annedrea Stackhouse, RN, [place]    Medication changes reported      No    Fall or balance concerns reported     No    Tobacco Cessation  No Change    Warm-up and Cool-down  Performed as group-led instruction    Resistance Training Performed  Yes    VAD Patient?  No    PAD/SET Patient?  No      Pain Assessment   Currently in Pain?  No/denies    Multiple Pain Sites  No       Capillary Blood Glucose: No results found for this or any previous visit (from the past 24 hour(s)).    Social History   Tobacco Use  Smoking Status Former Smoker  . Types: Cigarettes  . Last attempt to quit: 2003  . Years since quitting: 16.7  Smokeless Tobacco Never Used    Goals Met:  Exercise tolerated well  Goals Unmet:  Not Applicable  Comments: Service time is from 10:30a to 12:30p    Dr. Rush Farmer is Medical Director for Pulmonary Rehab at [facility].

## 2018-04-13 DIAGNOSIS — L989 Disorder of the skin and subcutaneous tissue, unspecified: Secondary | ICD-10-CM | POA: Diagnosis not present

## 2018-04-14 ENCOUNTER — Ambulatory Visit: Payer: Medicare HMO | Admitting: Pulmonary Disease

## 2018-04-14 ENCOUNTER — Encounter: Payer: Self-pay | Admitting: Pulmonary Disease

## 2018-04-14 ENCOUNTER — Encounter (HOSPITAL_COMMUNITY): Payer: Medicare HMO

## 2018-04-14 DIAGNOSIS — J9611 Chronic respiratory failure with hypoxia: Secondary | ICD-10-CM

## 2018-04-14 DIAGNOSIS — Z23 Encounter for immunization: Secondary | ICD-10-CM | POA: Diagnosis not present

## 2018-04-14 DIAGNOSIS — J449 Chronic obstructive pulmonary disease, unspecified: Secondary | ICD-10-CM | POA: Diagnosis not present

## 2018-04-14 MED ORDER — AZITHROMYCIN 250 MG PO TABS: ORAL_TABLET | ORAL | 0 refills | Status: DC

## 2018-04-14 NOTE — Progress Notes (Signed)
   Subjective:    Patient ID: Rebecca [name], female    DOB: Jul 07, 1941, 77 y.o.   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 She reports occasional chest tightness and wheezing.  Compliant with Anoro daily has not needed rescue albuterol much. Denies pedal edema orthopnea or PND She would like to know prognosis of COPD She is compliant with oxygen, sometimes takes it off at rest when she is playing bingo. Her previous pulmonologist she is to keep a prescription for Z-Pak pending in the pharmacy  Significant tests/ events reviewed Spirometry 01/2018 showed severe airway obstruction with ratio 43, FEV1 of 0.57-31% and FVC of 53%.  Oxygen saturation was 91% RA at rest , dropped to 82% on room air, and stayed up on 3 L pulse O2 on ambulation  Review of Systems neg for any significant sore throat, dysphagia, itching, sneezing, nasal congestion or excess/ purulent secretions, fever, chills, sweats, unintended wt loss, pleuritic or exertional cp, hempoptysis, orthopnea pnd or change in chronic leg swelling. Also denies presyncope, palpitations, heartburn, abdominal pain, nausea, vomiting, diarrhea or change in bowel or urinary habits, dysuria,hematuria, rash, arthralgias, visual complaints, headache, numbness weakness or ataxia.     Objective:   Physical Exam   Gen. Pleasant, well-nourished, in no distress ENT - no thrush, no post nasal drip Neck: No JVD, no thyromegaly, no carotid bruits Lungs: no use of accessory muscles, no dullness to percussion, decreased BL  without rales or rhonchi  Cardiovascular: Rhythm regular, heart sounds  normal, no murmurs or gallops, no peripheral edema Musculoskeletal: No deformities, no cyanosis or clubbing         Assessment &  Plan:

## 2018-04-14 NOTE — Patient Instructions (Signed)
Rx for zpak pending in your pharmacy Flu shot  stay on anoro  Stay on 3-4 L o2 on exertion

## 2018-04-14 NOTE — Assessment & Plan Note (Signed)
Rx for zpak pending in your pharmacy Flu shot  stay on anoro

## 2018-04-14 NOTE — Assessment & Plan Note (Signed)
Stay on 3-4 L o2 on exertion 2L at rest ok

## 2018-04-16 ENCOUNTER — Encounter (HOSPITAL_COMMUNITY)
Admission: RE | Admit: 2018-04-16 | Discharge: 2018-04-16 | Disposition: A | Discharge status: Home / Self Care | Payer: Medicare HMO | Source: Ambulatory Visit | Attending: Pulmonary Disease | Admitting: Pulmonary Disease

## 2018-04-16 DIAGNOSIS — J9611 Chronic respiratory failure with hypoxia: Secondary | ICD-10-CM | POA: Insufficient documentation

## 2018-04-16 NOTE — Progress Notes (Signed)
Daily Session Note  Patient Details  Name: Rebecca [name] MRN: [id number] Date of Birth: April 25, 1941 Referring Provider:     Pulmonary Rehab Walk Test from 03/05/2018 in [place]  Referring Provider  Elsworth Soho      Encounter Date: 04/16/2018  Check In: Session Check In - 04/16/18 1235      Check-In   Supervising physician immediately available to respond to emergencies  [facility] immediately available    Physician(s)  Dr. Horris Latino    Location  [facility]    Staff Present  Maurice Small, RN, BSN;Molly DiVincenzo, MS, ACSM RCEP, Exercise Physiologist;Annedrea Rosezella [place], RN, Ramonita Lab, RN    Medication changes reported      No    Fall or balance concerns reported     No    Tobacco Cessation  No Change    Warm-up and Cool-down  Not performed (comment)    Resistance Training Performed  Yes    VAD Patient?  No    PAD/SET Patient?  No      Pain Assessment   Currently in Pain?  No/denies    Multiple Pain Sites  No       Capillary Blood Glucose: No results found for this or any previous visit (from the past 24 hour(s)).    Social History   Tobacco Use  Smoking Status Former Smoker  . Types: Cigarettes  . Last attempt to quit: 2003  . Years since quitting: 16.7  Smokeless Tobacco Never Used    Goals Met:  Exercise tolerated well No report of cardiac concerns or symptoms Strength training completed today  Goals Unmet:  Not Applicable  Comments: Service time is from 1030 to 1215    Dr. Rush Farmer is Medical Director for Pulmonary Rehab at [facility].

## 2018-04-20 DIAGNOSIS — R8290 Unspecified abnormal findings in urine: Secondary | ICD-10-CM | POA: Diagnosis not present

## 2018-04-20 DIAGNOSIS — I10 Essential (primary) hypertension: Secondary | ICD-10-CM | POA: Diagnosis not present

## 2018-04-21 ENCOUNTER — Encounter (HOSPITAL_COMMUNITY)
Admission: RE | Admit: 2018-04-21 | Discharge: 2018-04-21 | Disposition: A | Discharge status: Home / Self Care | Payer: Medicare HMO | Source: Ambulatory Visit | Attending: Pulmonary Disease | Admitting: Pulmonary Disease

## 2018-04-21 DIAGNOSIS — J9611 Chronic respiratory failure with hypoxia: Secondary | ICD-10-CM

## 2018-04-21 NOTE — Progress Notes (Signed)
Daily Session Note  Patient Details  Name: LUDELL ZACARIAS MRN: [id number] Date of Birth: 1941/02/01 Referring Provider:     Pulmonary Rehab Walk Test from 03/05/2018 in [place]  Referring Provider  Elsworth Soho      Encounter Date: 04/21/2018  Check In: Session Check In - 04/21/18 1203      Check-In   Supervising physician immediately available to respond to emergencies  [facility] immediately available    Physician(s)  Dr. Horris Latino    Location  [facility]    Staff Present  Su Hilt, MS, ACSM RCEP, Exercise Physiologist;Annedrea Rosezella [place], RN, Ramonita Lab, RN    Medication changes reported      No    Fall or balance concerns reported     No    Tobacco Cessation  No Change    Warm-up and Cool-down  Performed as group-led instruction    Resistance Training Performed  Yes    VAD Patient?  No    PAD/SET Patient?  No      Pain Assessment   Currently in Pain?  No/denies       Capillary Blood Glucose: No results found for this or any previous visit (from the past 24 hour(s)).    Social History   Tobacco Use  Smoking Status Former Smoker  . Types: Cigarettes  . Last attempt to quit: 2003  . Years since quitting: 16.7  Smokeless Tobacco Never Used    Goals Met:  Exercise tolerated well No report of cardiac concerns or symptoms Strength training completed today  Goals Unmet:  Not Applicable  Comments: Service time is from 1030 to 1210    Dr. Rush Farmer is Medical Director for Pulmonary Rehab at [facility].

## 2018-04-23 ENCOUNTER — Encounter (HOSPITAL_COMMUNITY): Payer: Medicare HMO

## 2018-04-23 ENCOUNTER — Encounter (HOSPITAL_COMMUNITY)
Admission: RE | Admit: 2018-04-23 | Discharge: 2018-04-23 | Disposition: A | Discharge status: Home / Self Care | Payer: Medicare HMO | Source: Ambulatory Visit | Attending: Pulmonary Disease | Admitting: Pulmonary Disease

## 2018-04-23 VITALS — Wt 163.8 lb

## 2018-04-23 DIAGNOSIS — J9611 Chronic respiratory failure with hypoxia: Secondary | ICD-10-CM

## 2018-04-23 NOTE — Progress Notes (Signed)
While exercising at Pulmonary Rehab, Rebecca [name] consistently uses 2 liters of continuous flow oxygen. This is provided by Pulmonary Rehab [employer]. At home, the patient currently uses a small e-tank with a pulsed regulator for their home oxygen system. While exercising at home, the patient was instructed to use 4 liters pulsed. In Pulmonary Rehab today, Rebecca [name] walked the track utilizing their own home oxygen system. They used 5 liters pulsed which maintained their oxygen saturation above 85% after 400 ft. Patient self reported that on 4 liters pulsed at home, patient will desaturate to low 80's with exertion.

## 2018-04-23 NOTE — Progress Notes (Signed)
Daily Session Note  Patient Details  Name: Rebecca [name] MRN: [id number] Date of Birth: [birth year] Referring Provider:     Pulmonary Rehab Walk Test from 03/05/2018 in [place]  Referring Provider  Elsworth Soho      Encounter Date: 04/23/2018  Check In: Session Check In - 04/23/18 1246      Check-In   Supervising physician immediately available to respond to emergencies  [facility] immediately available    Physician(s)  Dr. Herbert Moors    Location  [facility]    Staff Present  Su Hilt, MS, ACSM RCEP, Exercise Physiologist;Dalton Kris Mouton, MS, Exercise Physiologist;Lisa Colletta [place], RN, MHA    Medication changes reported      No    Fall or balance concerns reported     No    Tobacco Cessation  No Change    Warm-up and Cool-down  Performed as group-led instruction    Resistance Training Performed  Yes    PAD/SET Patient?  No      Pain Assessment   Currently in Pain?  No/denies    Pain Score  0-No pain    Multiple Pain Sites  No       Capillary Blood Glucose: No results found for this or any previous visit (from the past 24 hour(s)).    Social History   Tobacco Use  Smoking Status Former Smoker  . Types: Cigarettes  . Last attempt to quit: 2003  . Years since quitting: 16.7  Smokeless Tobacco Never Used    Goals Met:  Exercise tolerated well  Goals Unmet:  Not Applicable  Comments: Service time is from 10:30a to 12:40p    Dr. Rush Farmer is Medical Director for Pulmonary Rehab at [facility].

## 2018-04-28 ENCOUNTER — Telehealth (HOSPITAL_COMMUNITY): Payer: Self-pay

## 2018-04-28 ENCOUNTER — Encounter (HOSPITAL_COMMUNITY): Payer: Medicare HMO

## 2018-04-28 DIAGNOSIS — R350 Frequency of micturition: Secondary | ICD-10-CM | POA: Diagnosis not present

## 2018-04-28 DIAGNOSIS — M545 Low back pain: Secondary | ICD-10-CM | POA: Diagnosis not present

## 2018-04-28 NOTE — Telephone Encounter (Signed)
Pt called and stated she will not be coming to rehab appointment this morning due to PCP dr appt. (Back problems). Cancelled appt.

## 2018-04-29 NOTE — Progress Notes (Signed)
Pulmonary Individual Treatment Plan  Patient Details  Name: Rebecca [name] MRN: [id number] Date of Birth: 03/08/1941 Referring Provider:     Pulmonary Rehab Walk Test from 03/05/2018 in [place]  Referring Provider  Elsworth Soho      Initial Encounter Date:    Pulmonary Rehab Walk Test from 03/05/2018 in [place]  Date  03/05/18      Visit Diagnosis: Chronic respiratory failure with hypoxia ([place])  Patient's Home Medications on Admission:   Current Outpatient Medications:  .  albuterol (PROVENTIL HFA;VENTOLIN HFA) 108 (90 Base) MCG/ACT inhaler, Inhale 1 puff into the lungs every 6 (six) hours as needed for wheezing or shortness of breath., Disp: , Rfl:  .  azithromycin (ZITHROMAX) 250 MG tablet, Take 2 tablets the first day and then 1 tablet a day until gone, Disp: 6 tablet, Rfl: 0 .  Calcium Carbonate-Vitamin D (CALCIUM 600+D) 600-200 MG-UNIT TABS, Take by mouth., Disp: , Rfl:  .  carvedilol (COREG) 25 MG tablet, 2 (two) times daily. , Disp: , Rfl:  .  metFORMIN (GLUCOPHAGE) 500 MG tablet, daily with supper. , Disp: , Rfl:  .  umeclidinium-vilanterol (ANORO ELLIPTA) 62.5-25 MCG/INH AEPB, Inhale 1 puff into the lungs daily., Disp: 1 each, Rfl: 3  Past Medical History: Past Medical History:  Diagnosis Date  . Congestive heart disease ([place])   . COPD (chronic obstructive pulmonary disease) ([place])   . Diabetes ([place])   . Hyperlipemia   . Osteoporosis   . Primary hypertension     Tobacco Use: Social History   Tobacco Use  Smoking Status Former Smoker  . Types: Cigarettes  . Last attempt to quit: 2003  . Years since quitting: 16.8  Smokeless Tobacco Never Used    Labs: Recent Review Flowsheet Data    There is no flowsheet data to display.      Capillary Blood Glucose: No results found for: GLUCAP POCT Glucose    Row Name 03/17/18 1252 03/31/18 1256           POCT Blood Glucose   Pre-Exercise  105  mg/dL  137 mg/dL      Post-Exercise  115 mg/dL  92 mg/dL         Pulmonary Assessment Scores: Pulmonary Assessment Scores    Row Name 03/04/18 1518 03/05/18 1311       ADL UCSD   ADL Phase  Entry  Entry    SOB Score total  70  -      CAT Score   CAT Score  20  -      mMRC Score   mMRC Score  -  1       Pulmonary Function Assessment:   Exercise Target Goals: Exercise Program Goal: Individual exercise prescription set using results from initial 6 min walk test and THRR while considering  patient's activity barriers and safety.   Exercise Prescription Goal: Initial exercise prescription builds to 30-45 minutes a day of aerobic activity, 2-3 days per week.  Home exercise guidelines will be given to patient during program as part of exercise prescription that the participant will acknowledge.  Activity Barriers & Risk Stratification: Activity Barriers & Cardiac Risk Stratification - 02/27/18 1027      Activity Barriers & Cardiac Risk Stratification   Activity Barriers  Back Problems;History of Falls;Shortness of Breath;Deconditioning;Assistive Device;Joint Problems   Right knee cap surgery   Cardiac Risk Stratification  High       6  Minute Walk: 6 Minute Walk    Row Name 03/05/18 1306         6 Minute Walk   Phase  Initial     Distance  1000 feet     Walk Time  - 5 minutes 30 seconds     # of Rest Breaks  1 30 second break standing     MPH  1.89     METS  2.38     RPE  12     Perceived Dyspnea   3     Symptoms  No     Resting HR  78 bpm     Resting BP  124/66     Resting Oxygen Saturation   97 %     Exercise Oxygen Saturation  during 6 min walk  90 %     Max Ex. HR  97 bpm     Max Ex. BP  124/84       Interval HR   1 Minute HR  84     2 Minute HR  91     3 Minute HR  95     4 Minute HR  97     5 Minute HR  94     6 Minute HR  94     2 Minute Post HR  85     Interval Heart Rate?  Yes       Interval Oxygen   Interval Oxygen?  Yes     Baseline  Oxygen Saturation %  97 %     1 Minute Oxygen Saturation %  97 %     1 Minute Liters of Oxygen  4 L     2 Minute Oxygen Saturation %  92 %     2 Minute Liters of Oxygen  4 L     3 Minute Oxygen Saturation %  90 %     3 Minute Liters of Oxygen  4 L     4 Minute Oxygen Saturation %  93 %     4 Minute Liters of Oxygen  4 L     5 Minute Oxygen Saturation %  94 %     5 Minute Liters of Oxygen  4 L     6 Minute Oxygen Saturation %  94 %     6 Minute Liters of Oxygen  4 L     2 Minute Post Oxygen Saturation %  100 %     2 Minute Post Liters of Oxygen  4 L        Oxygen Initial Assessment: Oxygen Initial Assessment - 03/05/18 1311      Initial 6 min Walk   Oxygen Used  Continuous;E-Tanks    Liters per minute  4      Program Oxygen Prescription   Program Oxygen Prescription  Continuous;E-Tanks    Liters per minute  4       Oxygen Re-Evaluation: Oxygen Re-Evaluation    Row Name 03/30/18 1631 04/28/18 1608           Program Oxygen Prescription   Program Oxygen Prescription  Continuous;E-Tanks  Continuous;E-Tanks      Liters per minute  4  4        Home Oxygen   Home Oxygen Device  E-Tanks;Home Concentrator  E-Tanks;Home Concentrator      Sleep Oxygen Prescription  Continuous  Continuous      Liters per minute  4  4  Home Exercise Oxygen Prescription  Pulsed  Pulsed      Liters per minute  4  4      Home at Rest Exercise Oxygen Prescription  Pulsed  Pulsed      Liters per minute  3  3      Compliance with Home Oxygen Use  Yes  Yes        Goals/Expected Outcomes   Short Term Goals  To learn and exhibit compliance with exercise, home and travel O2 prescription;To learn and understand importance of monitoring SPO2 with pulse oximeter and demonstrate accurate use of the pulse oximeter.;To learn and understand importance of maintaining oxygen saturations>88%;To learn and demonstrate proper pursed lip breathing techniques or other breathing techniques.;To learn and demonstrate  proper use of respiratory medications  To learn and exhibit compliance with exercise, home and travel O2 prescription;To learn and understand importance of monitoring SPO2 with pulse oximeter and demonstrate accurate use of the pulse oximeter.;To learn and understand importance of maintaining oxygen saturations>88%;To learn and demonstrate proper pursed lip breathing techniques or other breathing techniques.;To learn and demonstrate proper use of respiratory medications      Long  Term Goals  Exhibits compliance with exercise, home and travel O2 prescription;Verbalizes importance of monitoring SPO2 with pulse oximeter and return demonstration;Maintenance of O2 saturations>88%;Exhibits proper breathing techniques, such as pursed lip breathing or other method taught during program session;Compliance with respiratory medication;Demonstrates proper use of MDI's  Exhibits compliance with exercise, home and travel O2 prescription;Verbalizes importance of monitoring SPO2 with pulse oximeter and return demonstration;Maintenance of O2 saturations>88%;Exhibits proper breathing techniques, such as pursed lip breathing or other method taught during program session;Compliance with respiratory medication;Demonstrates proper use of MDI's      Goals/Expected Outcomes  compliance  compliance         Oxygen Discharge (Final Oxygen Re-Evaluation): Oxygen Re-Evaluation - 04/28/18 1608      Program Oxygen Prescription   Program Oxygen Prescription  Continuous;E-Tanks    Liters per minute  4      Home Oxygen   Home Oxygen Device  E-Tanks;Home Concentrator    Sleep Oxygen Prescription  Continuous    Liters per minute  4    Home Exercise Oxygen Prescription  Pulsed    Liters per minute  4    Home at Rest Exercise Oxygen Prescription  Pulsed    Liters per minute  3    Compliance with Home Oxygen Use  Yes      Goals/Expected Outcomes   Short Term Goals  To learn and exhibit compliance with exercise, home and travel  O2 prescription;To learn and understand importance of monitoring SPO2 with pulse oximeter and demonstrate accurate use of the pulse oximeter.;To learn and understand importance of maintaining oxygen saturations>88%;To learn and demonstrate proper pursed lip breathing techniques or other breathing techniques.;To learn and demonstrate proper use of respiratory medications    Long  Term Goals  Exhibits compliance with exercise, home and travel O2 prescription;Verbalizes importance of monitoring SPO2 with pulse oximeter and return demonstration;Maintenance of O2 saturations>88%;Exhibits proper breathing techniques, such as pursed lip breathing or other method taught during program session;Compliance with respiratory medication;Demonstrates proper use of MDI's    Goals/Expected Outcomes  compliance       Initial Exercise Prescription: Initial Exercise Prescription - 03/05/18 1300      Date of Initial Exercise RX and Referring Provider   Date  03/05/18    Referring Provider  Elsworth Soho      Oxygen  Oxygen  Continuous    Liters  4      Recumbant Bike   Level  2    Watts  10    Minutes  17      NuStep   Level  2    SPM  80    Minutes  17    METs  1.5      Track   Laps  5    Minutes  17      Prescription Details   Frequency (times per week)  2    Duration  Progress to 45 minutes of aerobic exercise without signs/symptoms of physical distress      Intensity   THRR 40-80% of Max Heartrate  57-114    Ratings of Perceived Exertion  11-13    Perceived Dyspnea  0-4      Progression   Progression  Continue progressive overload as per policy without signs/symptoms or physical distress.      Resistance Training   Training Prescription  Yes    Weight  orange bands    Reps  10-15       Perform Capillary Blood Glucose checks as needed.  Exercise Prescription Changes: Exercise Prescription Changes    Row Name 03/17/18 1200 03/31/18 1200 04/02/18 1008 04/09/18 1606       Response to  Exercise   Blood Pressure (Admit)  134/70  130/80  -  132/68    Blood Pressure (Exercise)  150/80  120/84  -  148/80    Blood Pressure (Exit)  122/62  122/80  -  118/82    Heart Rate (Admit)  72 bpm  72 bpm  -  71 bpm    Heart Rate (Exercise)  87 bpm  90 bpm  -  92 bpm    Heart Rate (Exit)  79 bpm  81 bpm  -  79 bpm    Oxygen Saturation (Admit)  97 %  96 %  -  96 %    Oxygen Saturation (Exercise)  94 %  92 %  -  94 %    Oxygen Saturation (Exit)  99 %  94 %  -  94 %    Rating of Perceived Exertion (Exercise)  13  13  -  15    Perceived Dyspnea (Exercise)  2  2  -  3    Duration  Progress to 45 minutes of aerobic exercise without signs/symptoms of physical distress  Progress to 45 minutes of aerobic exercise without signs/symptoms of physical distress  -  Continue with 45 min of aerobic exercise without signs/symptoms of physical distress.    Intensity  Other (comment) 40-80% of HRR  Other (comment) 40-80% of HRR  -  THRR unchanged      Progression   Progression  Continue to progress workloads to maintain intensity without signs/symptoms of physical distress.  Continue to progress workloads to maintain intensity without signs/symptoms of physical distress.  -  Continue to progress workloads to maintain intensity without signs/symptoms of physical distress.      Resistance Training   Training Prescription  Yes  Yes  -  Yes    Weight  orange bands  orange bands  -  orange bands    Reps  10-15  10-15  -  10-15    Time  10 Minutes  10 Minutes  -  -      Oxygen   Oxygen  Continuous  Continuous  -  Continuous  Liters  4  4  -  4      Recumbant Bike   Level  2  2  -  2    Watts  10  10  -  -    Minutes  17  17  -  17      NuStep   Level  2  3  -  -    SPM  80  80  -  -    Minutes  17  17  -  -    METs  1.4  1.7  -  -      Track   Laps  5  6  -  6    Minutes  17  17  -  17      Home Exercise Plan   Plans to continue exercise at  -  -  Home (comment)  -    Frequency  -  -  Add 2  additional days to program exercise sessions.  -       Exercise Comments: Exercise Comments    Row Name 04/03/18 1008           Exercise Comments  Home exercise completed          Exercise Goals and Review: Exercise Goals    Row Name 02/27/18 1029             Exercise Goals   Increase Physical Activity  Yes       Intervention  Provide advice, education, support and counseling about physical activity/exercise needs.;Develop an individualized exercise prescription for aerobic and resistive training based on initial evaluation findings, risk stratification, comorbidities and participant's personal goals.       Expected Outcomes  Short Term: Attend rehab on a regular basis to increase amount of physical activity.;Long Term: Add in home exercise to make exercise part of routine and to increase amount of physical activity.;Long Term: Exercising regularly at least 3-5 days a week.       Increase Strength and Stamina  Yes       Intervention  Provide advice, education, support and counseling about physical activity/exercise needs.;Develop an individualized exercise prescription for aerobic and resistive training based on initial evaluation findings, risk stratification, comorbidities and participant's personal goals.       Expected Outcomes  Short Term: Increase workloads from initial exercise prescription for resistance, speed, and METs.;Short Term: Perform resistance training exercises routinely during rehab and add in resistance training at home;Long Term: Improve cardiorespiratory fitness, muscular endurance and strength as measured by increased METs and functional capacity (6MWT)       Able to understand and use rate of perceived exertion (RPE) scale  Yes       Intervention  Provide education and explanation on how to use RPE scale       Expected Outcomes  Short Term: Able to use RPE daily in rehab to express subjective intensity level;Long Term:  Able to use RPE to guide intensity level  when exercising independently       Able to understand and use Dyspnea scale  Yes       Intervention  Provide education and explanation on how to use Dyspnea scale       Expected Outcomes  Short Term: Able to use Dyspnea scale daily in rehab to express subjective sense of shortness of breath during exertion;Long Term: Able to use Dyspnea scale to guide intensity level when exercising independently  Knowledge and understanding of Target Heart Rate Range (THRR)  Yes       Intervention  Provide education and explanation of THRR including how the numbers were predicted and where they are located for reference       Expected Outcomes  Short Term: Able to state/look up THRR;Short Term: Able to use daily as guideline for intensity in rehab;Long Term: Able to use THRR to govern intensity when exercising independently       Understanding of Exercise Prescription  Yes       Intervention  Provide education, explanation, and written materials on patient's individual exercise prescription       Expected Outcomes  Short Term: Able to explain program exercise prescription;Long Term: Able to explain home exercise prescription to exercise independently          Exercise Goals Re-Evaluation : Exercise Goals Re-Evaluation    Row Name 03/30/18 1632 04/28/18 1609           Exercise Goal Re-Evaluation   Exercise Goals Review  Increase Physical Activity;Increase Strength and Stamina;Able to understand and use rate of perceived exertion (RPE) scale;Able to understand and use Dyspnea scale;Knowledge and understanding of Target Heart Rate Range (THRR);Understanding of Exercise Prescription  Increase Physical Activity;Increase Strength and Stamina;Able to understand and use rate of perceived exertion (RPE) scale;Able to understand and use Dyspnea scale;Knowledge and understanding of Target Heart Rate Range (THRR);Understanding of Exercise Prescription      Comments  Patient has only attended 5 rehab sessions.  Patient's progress is going to be slow. However, she started off walking 4 laps (200 ft each) in 15 minutes to now 8 laps. MET average places her in a low level. Will cont. to monitor and motivate.  Patient is progressing slowly. She has a lot of complaints regarding pain. She is able to walk 8 laps (200 ft) in 15 minutes. MET average placees her in a low level. She is not open to workload changes. She is hesitant to exercise at home. Will cont to monitor and motivate as much as possible.      Expected Outcomes  Through exercise at rehab and at home, the patient will decrease shortness of breath with daily activities and feel confident in carrying out an exercise regime at home.   Through exercise at rehab and at home, the patient will decrease shortness of breath with daily activities and feel confident in carrying out an exercise regime at home.          Discharge Exercise Prescription (Final Exercise Prescription Changes): Exercise Prescription Changes - 04/09/18 1606      Response to Exercise   Blood Pressure (Admit)  132/68    Blood Pressure (Exercise)  148/80    Blood Pressure (Exit)  118/82    Heart Rate (Admit)  71 bpm    Heart Rate (Exercise)  92 bpm    Heart Rate (Exit)  79 bpm    Oxygen Saturation (Admit)  96 %    Oxygen Saturation (Exercise)  94 %    Oxygen Saturation (Exit)  94 %    Rating of Perceived Exertion (Exercise)  15    Perceived Dyspnea (Exercise)  3    Duration  Continue with 45 min of aerobic exercise without signs/symptoms of physical distress.    Intensity  THRR unchanged      Progression   Progression  Continue to progress workloads to maintain intensity without signs/symptoms of physical distress.      Resistance Training  Training Prescription  Yes    Weight  orange bands    Reps  10-15      Oxygen   Oxygen  Continuous    Liters  4      Recumbant Bike   Level  2    Minutes  17      Track   Laps  6    Minutes  17       Nutrition:  Target  Goals: Understanding of nutrition guidelines, daily intake of sodium <1527m, cholesterol <2077m calories 30% from fat and 7% or less from saturated fats, daily to have 5 or more servings of fruits and vegetables.  Biometrics:    Nutrition Therapy Plan and Nutrition Goals: Nutrition Therapy & Goals - 03/03/18 0938      Nutrition Therapy   Diet  general healthful      Personal Nutrition Goals   Nutrition Goal  Identify food quantities necessary to achieve wt loss of  -2# per week to a goal wt loss of 6-10 lbs at graduation from pulmonary rehab.    Personal Goal #2  Describe the benefit of including fruits, vegetables, whole grains, and low-fat dairy products in a healthy meal plan.      Intervention Plan   Intervention  Prescribe, educate and counsel regarding individualized specific dietary modifications aiming towards targeted core components such as weight, hypertension, lipid management, diabetes, heart failure and other comorbidities.    Expected Outcomes  Short Term Goal: Understand basic principles of dietary content, such as calories, fat, sodium, cholesterol and nutrients.       Nutrition Assessments: Nutrition Assessments - 03/03/18 0941      Rate Your Plate Scores   Pre Score  50       Nutrition Goals Re-Evaluation: Nutrition Goals Re-Evaluation    [place] 03/03/18 0938             Goals   Current Weight  160 lb 15 oz (73 kg)          Nutrition Goals Discharge (Final Nutrition Goals Re-Evaluation): Nutrition Goals Re-Evaluation - 03/03/18 0938      Goals   Current Weight  160 lb 15 oz (73 kg)       Psychosocial: Target Goals: Acknowledge presence or absence of significant depression and/or stress, maximize coping skills, provide positive support system. Participant is able to verbalize types and ability to use techniques and skills needed for reducing stress and depression.  Initial Review & Psychosocial Screening: Initial Psych Review & Screening -  02/27/18 1036      Initial Review   Current issues with  History of Depression      Family Dynamics   Good Support System?  Yes   [place] and [place]   Comments  support of family and freinds at the independent living      Barriers   Psychosocial barriers to participate in program  There are no identifiable barriers or psychosocial needs.      Screening Interventions   Interventions  Encouraged to exercise    Expected Outcomes  Short Term goal: Identification and review with participant of any Quality of Life or Depression concerns found by scoring the questionnaire.;Long Term goal: The participant improves quality of Life and PHQ9 Scores as seen by post scores and/or verbalization of changes       Quality of Life Scores:  Scores of 19 and below usually indicate a poorer quality of life in  these areas.  A difference of  2-3 points is a clinically meaningful difference.  A difference of 2-3 points in the total score of the Quality of Life Index has been associated with significant improvement in overall quality of life, self-image, physical symptoms, and general health in studies assessing change in quality of life.  PHQ-9: Recent Review Flowsheet Data    Depression screen [facility] 2/9 02/27/2018   Decreased Interest 0   Down, Depressed, Hopeless 0   PHQ - 2 Score 0   Altered sleeping 0   Tired, decreased energy 1   Change in appetite 0   Feeling bad or failure about yourself  0   Trouble concentrating 0   Moving slowly or fidgety/restless 0   Suicidal thoughts 0   PHQ-9 Score 1   Difficult doing work/chores Not difficult at all     Interpretation of Total Score  Total Score Depression Severity:  1-4 = Minimal depression, 5-9 = Mild depression, 10-14 = Moderate depression, 15-19 = Moderately severe depression, 20-27 = Severe depression   Psychosocial Evaluation and Intervention: Psychosocial Evaluation - 04/29/18 1913       Psychosocial Evaluation & Interventions   Interventions  Relaxation education;Stress management education;Encouraged to exercise with the program and follow exercise prescription    Comments  no identifiable barriers Pt is quiet by nature but will engage in conversation when asked questions that require feedback    Expected Outcomes  Continue to maintain sense well being     Continue Psychosocial Services   Follow up required by staff       Psychosocial Re-Evaluation: Psychosocial Re-Evaluation    Row Name 04/01/18 1540 04/29/18 1913           Psychosocial Re-Evaluation   Current issues with  History of Depression  History of Depression      Comments  no identifiable barriers.  Pt is very happy at her assisted living facility and has made lots of freinds  no identifiable barriers.  Pt is very happy at her assisted living facility and has made lots of freinds      Expected Outcomes  Pt will continue to display mental well being with positve and realistic outlook of her future  Pt will continue to display mental well being with positve and realistic outlook of her future      Interventions  Encouraged to attend Pulmonary Rehabilitation for the exercise;Stress management education;Relaxation education  Encouraged to attend Pulmonary Rehabilitation for the exercise;Stress management education;Relaxation education      Continue Psychosocial Services   Follow up required by staff  No Follow up required         Psychosocial Discharge (Final Psychosocial Re-Evaluation): Psychosocial Re-Evaluation - 04/29/18 1913      Psychosocial Re-Evaluation   Current issues with  History of Depression    Comments  no identifiable barriers.  Pt is very happy at her assisted living facility and has made lots of freinds    Expected Outcomes  Pt will continue to display mental well being with positve and realistic outlook of her future    Interventions  Encouraged to attend Pulmonary Rehabilitation for the  exercise;Stress management education;Relaxation education    Continue Psychosocial Services   No Follow up required       Education: Education Goals: Education classes will be provided on a weekly basis, covering required topics. Participant will state understanding/return demonstration of topics presented.  Learning Barriers/Preferences: Learning Barriers/Preferences - 02/27/18 1039  Learning Barriers/Preferences   Learning Barriers  Sight    Learning Preferences  Skilled Demonstration;Group Instruction;Individual Instruction;Audio;Written Material       Education Topics: Risk Factor Reduction:  -Group instruction that is supported by a PowerPoint presentation. Instructor discusses the definition of a risk factor, different risk factors for pulmonary disease, and how the heart and lungs work together.     Nutrition for Pulmonary Patient:  -Group instruction provided by PowerPoint slides, verbal discussion, and written materials to support subject matter. The instructor gives an explanation and review of healthy diet recommendations, which includes a discussion on weight management, recommendations for fruit and vegetable consumption, as well as protein, fluid, caffeine, fiber, sodium, sugar, and alcohol. Tips for eating when patients are short of breath are discussed.   PULMONARY REHAB OTHER RESPIRATORY from 04/23/2018 in [place]  Date  03/12/18  Educator  Rodman Pickle  Instruction Review Code  2- Demonstrated Understanding      Pursed Lip Breathing:  -Group instruction that is supported by demonstration and informational handouts. Instructor discusses the benefits of pursed lip and diaphragmatic breathing and detailed demonstration on how to preform both.     Oxygen Safety:  -Group instruction provided by PowerPoint, verbal discussion, and written material to support subject matter. There is an overview of "What is Oxygen" and "Why do we need  it".  Instructor also reviews how to create a safe environment for oxygen use, the importance of using oxygen as prescribed, and the risks of noncompliance. There is a brief discussion on traveling with oxygen and resources the patient may utilize.   PULMONARY REHAB OTHER RESPIRATORY from 04/23/2018 in [place]  Date  04/16/18  Educator  molly  Instruction Review Code  1- Verbalizes Understanding      Oxygen Equipment:  -Group instruction provided by [employer], written materials, and Insurance underwriter.   PULMONARY REHAB OTHER RESPIRATORY from 04/23/2018 in [place]  Date  04/23/18  Educator  Iona Beard  Instruction Review Code  1- Verbalizes Understanding      Signs and Symptoms:  -Group instruction provided by written material and verbal discussion to support subject matter. Warning signs and symptoms of infection, stroke, and heart attack are reviewed and when to call the physician/911 reinforced. Tips for preventing the spread of infection discussed.   PULMONARY REHAB OTHER RESPIRATORY from 04/23/2018 in [place]  Date  04/02/18  Educator  RN  Instruction Review Code  2- Demonstrated Understanding      Advanced Directives:  -Group instruction provided by verbal instruction and written material to support subject matter. Instructor reviews Advanced Directive laws and proper instruction for filling out document.   Pulmonary Video:  -Group video education that reviews the importance of medication and oxygen compliance, exercise, good nutrition, pulmonary hygiene, and pursed lip and diaphragmatic breathing for the pulmonary patient.   Exercise for the Pulmonary Patient:  -Group instruction that is supported by a PowerPoint presentation. Instructor discusses benefits of exercise, core components of exercise, frequency, duration, and intensity of an  exercise routine, importance of utilizing pulse oximetry during exercise, safety while exercising, and options of places to exercise outside of rehab.     Pulmonary Medications:  -Verbally interactive group education provided by instructor with focus on inhaled medications and proper administration.   PULMONARY REHAB OTHER RESPIRATORY from 04/23/2018 in [place]  Date  03/24/18  Educator  pharm  Instruction Review Code  2- Demonstrated Understanding      Anatomy and Physiology of the Respiratory System and Intimacy:  -Group instruction provided by PowerPoint, verbal discussion, and written material to support subject matter. Instructor reviews respiratory cycle and anatomical components of the respiratory system and their functions. Instructor also reviews differences in obstructive and restrictive respiratory diseases with examples of each. Intimacy, Sex, and Sexuality differences are reviewed with a discussion on how relationships can change when diagnosed with pulmonary disease. Common sexual concerns are reviewed.   MD DAY -A group question and answer session with a medical doctor that allows participants to ask questions that relate to their pulmonary disease state.   OTHER EDUCATION -Group or individual verbal, written, or video instructions that support the educational goals of the pulmonary rehab program.   PULMONARY REHAB OTHER RESPIRATORY from 04/23/2018 in [place]  Date  04/02/18  Educator  Cloyde Reams  Instruction Review Code  2- Demonstrated Understanding [Sedentary Lifestyle]      Holiday Eating Survival Tips:  -Group instruction provided by PowerPoint slides, verbal discussion, and written materials to support subject matter. The instructor gives patients tips, tricks, and techniques to help them not only survive but enjoy the holidays despite the onslaught of food that accompanies the holidays.   Knowledge  Questionnaire Score: Knowledge Questionnaire Score - 03/04/18 1518      Knowledge Questionnaire Score   Pre Score  14/18       Core Components/Risk Factors/Patient Goals at Admission: Personal Goals and Risk Factors at Admission - 02/27/18 1040      Core Components/Risk Factors/Patient Goals on Admission    Weight Management  Yes;Weight Loss    Intervention  Weight Management: Develop a combined nutrition and exercise program designed to reach desired caloric intake, while maintaining appropriate intake of nutrient and fiber, sodium and fats, and appropriate energy expenditure required for the weight goal.;Weight Management: Provide education and appropriate resources to help participant work on and attain dietary goals.;Obesity: Provide education and appropriate resources to help participant work on and attain dietary goals.    Admit Weight  160 lb 7.9 oz (72.8 kg)    Goal Weight: Short Term  155 lb (70.3 kg)    Goal Weight: Long Term  150 lb (68 kg)    Expected Outcomes  Understanding of distribution of calorie intake throughout the day with the consumption of 4-5 meals/snacks;Understanding recommendations for meals to include 15-35% energy as protein, 25-35% energy from fat, 35-60% energy from carbohydrates, less than 271m of dietary cholesterol, 20-35 gm of total fiber daily;Weight Loss: Understanding of general recommendations for a balanced deficit meal plan, which promotes 1-2 lb weight loss per week and includes a negative energy balance of [phone] kcal/d;Long Term: Adherence to nutrition and physical activity/exercise program aimed toward attainment of established weight goal;Short Term: Continue to assess and modify interventions until short term weight is achieved    Improve shortness of breath with ADL's  Yes    Intervention  Provide education, individualized exercise plan and daily activity instruction to help decrease symptoms of SOB with activities of daily living.    Expected  Outcomes  Short Term: Improve cardiorespiratory fitness to achieve a reduction of symptoms when performing ADLs;Long Term: Be able to perform more ADLs without symptoms or delay the onset of symptoms    Diabetes  Yes    Intervention  Provide education about signs/symptoms and action to take for hypo/hyperglycemia.;Provide education about  proper nutrition, including hydration, and aerobic/resistive exercise prescription along with prescribed medications to achieve blood glucose in normal ranges: Fasting glucose 65-99 mg/dL    Expected Outcomes  Short Term: Participant verbalizes understanding of the signs/symptoms and immediate care of hyper/hypoglycemia, proper foot care and importance of medication, aerobic/resistive exercise and nutrition plan for blood glucose control.;Long Term: Attainment of HbA1C < 7%.    Hypertension  Yes    Intervention  Provide education on lifestyle modifcations including regular physical activity/exercise, weight management, moderate sodium restriction and increased consumption of fresh fruit, vegetables, and low fat dairy, alcohol moderation, and smoking cessation.;Monitor prescription use compliance.    Expected Outcomes  Short Term: Continued assessment and intervention until BP is < 140/27m HG in hypertensive participants. < 130/842mHG in hypertensive participants with diabetes, heart failure or chronic kidney disease.;Long Term: Maintenance of blood pressure at goal levels.    Lipids  Yes    Intervention  Provide education and support for participant on nutrition & aerobic/resistive exercise along with prescribed medications to achieve LDL <706mHDL >3m57m  Expected Outcomes  Short Term: Participant states understanding of desired cholesterol values and is compliant with medications prescribed. Participant is following exercise prescription and nutrition guidelines.;Long Term: Cholesterol controlled with medications as prescribed, with individualized exercise RX and  with personalized nutrition plan. Value goals: LDL < 70mg35mL > 40 mg.       Core Components/Risk Factors/Patient Goals Review:  Goals and Risk Factor Review    Row Name 04/01/18 1541 04/29/18 1913           Core Components/Risk Factors/Patient Goals Review   Personal Goals Review  Weight Management/Obesity;Develop more efficient breathing techniques such as purse lipped breathing and diaphragmatic breathing and practicing self-pacing with activity.;Increase knowledge of respiratory medications and ability to use respiratory devices properly.;Improve shortness of breath with ADL's;Diabetes;Lipids  Weight Management/Obesity;Develop more efficient breathing techniques such as purse lipped breathing and diaphragmatic breathing and practicing self-pacing with activity.;Increase knowledge of respiratory medications and ability to use respiratory devices properly.;Improve shortness of breath with ADL's;Diabetes      Review  Pt has completed 6 exercise sessions.  Pt with wt gain of .7 kg.  Hopefully we will see an improvement as pt is able to be more active on off days from rehab.  Pt completed respiratory medication class and will engage in PLB with verbal cues from staff.   Pt reports CBG well within normal limits to support exercise safely.   Pt rates her shortness of breath 1-2 level for dypnea.  Pt reports her lipid readings are in acceptable range, will resolve this goal.   Pt workloads nustep increased to level 3,  recumbent  level 2 and averages 6-8 laps around the track.  Continue to montior pt progress during the next 30 day assessment  Pt has completed 12 exercise sessions.  Pt remains a wt gain of .7 kg.  Hopefully we will see an improvement as pt is able to be more active on off days from rehab.  Pt  will engage in PLB mostly independently.   Pt reports CBG well within normal limits to support exercise safely.   Pt rates her shortness of breath 1-2 level for dypnea.    Pt workloads nustep  maintain at  level 3,  recumbent  level 2 and averages 6-8 laps around the track.  Continue to montior pt progress during the next 30 day assessment      Expected Outcomes  See  Admission Goals/Outcomes  See Admission Goals/Outcomes         Core Components/Risk Factors/Patient Goals at Discharge (Final Review):  Goals and Risk Factor Review - 04/29/18 1913      Core Components/Risk Factors/Patient Goals Review   Personal Goals Review  Weight Management/Obesity;Develop more efficient breathing techniques such as purse lipped breathing and diaphragmatic breathing and practicing self-pacing with activity.;Increase knowledge of respiratory medications and ability to use respiratory devices properly.;Improve shortness of breath with ADL's;Diabetes    Review  Pt has completed 12 exercise sessions.  Pt remains a wt gain of .7 kg.  Hopefully we will see an improvement as pt is able to be more active on off days from rehab.  Pt  will engage in PLB mostly independently.   Pt reports CBG well within normal limits to support exercise safely.   Pt rates her shortness of breath 1-2 level for dypnea.    Pt workloads nustep maintain at  level 3,  recumbent  level 2 and averages 6-8 laps around the track.  Continue to montior pt progress during the next 30 day assessment    Expected Outcomes  See Admission Goals/Outcomes       ITP Comments: ITP Comments    [place] Name 02/27/18 1006 04/01/18 1539 04/29/18 1913       ITP Comments  Dr. Jennet Maduro, Medical Director  Dr. Jennet Maduro, Medical Director Pulmonary Rehab  Dr. Jennet Maduro, Medical Director Pulmonary Rehab        Comments: Pt completed 12 exercise sessions. Continue to monitor progress. Cherre Huger, BSN Cardiac and Training and development officer

## 2018-04-30 ENCOUNTER — Telehealth (HOSPITAL_COMMUNITY): Payer: Self-pay

## 2018-04-30 ENCOUNTER — Encounter (HOSPITAL_COMMUNITY)
Admission: RE | Admit: 2018-04-30 | Discharge: 2018-04-30 | Disposition: A | Discharge status: Home / Self Care | Payer: Medicare HMO | Source: Ambulatory Visit | Attending: Pulmonary Disease | Admitting: Pulmonary Disease

## 2018-05-05 ENCOUNTER — Encounter (HOSPITAL_COMMUNITY)
Admission: RE | Admit: 2018-05-05 | Discharge: 2018-05-05 | Disposition: A | Discharge status: Home / Self Care | Payer: Medicare HMO | Source: Ambulatory Visit | Attending: Pulmonary Disease | Admitting: Pulmonary Disease

## 2018-05-05 DIAGNOSIS — J9611 Chronic respiratory failure with hypoxia: Secondary | ICD-10-CM | POA: Diagnosis not present

## 2018-05-05 NOTE — Progress Notes (Signed)
Daily Session Note  Patient Details  Name: Rebecca [name] MRN: [id number] Date of Birth: 1940-10-25 Referring Provider:     Pulmonary Rehab Walk Test from 03/05/2018 in [place]  Referring Provider  Elsworth Soho      Encounter Date: 05/05/2018  Check In: Session Check In - 05/05/18 1024      Check-In   Supervising physician immediately available to respond to emergencies  [facility] immediately available    Physician(s)  Dr. Ree Kida    Location  [facility]    Staff Present  Ramon Dredge, RN, MHA;Molly DiVincenzo, MS, ACSM RCEP, Exercise Physiologist;Dalton Kris Mouton, MS, Exercise Physiologist;Marisol Giambra Ysidro Evert, RN    Medication changes reported      No    Fall or balance concerns reported     No    Tobacco Cessation  No Change    Warm-up and Cool-down  Performed as group-led instruction    Resistance Training Performed  Yes    VAD Patient?  No    PAD/SET Patient?  No      Pain Assessment   Currently in Pain?  No/denies    Multiple Pain Sites  No       Capillary Blood Glucose: No results found for this or any previous visit (from the past 24 hour(s)).    Social History   Tobacco Use  Smoking Status Former Smoker  . Types: Cigarettes  . Last attempt to quit: 2003  . Years since quitting: 16.8  Smokeless Tobacco Never Used    Goals Met:  Exercise tolerated well No report of cardiac concerns or symptoms Strength training completed today  Goals Unmet:  Not Applicable  Comments: Service time is from 1030 to 1200    Dr. Rush Farmer is Medical Director for Pulmonary Rehab at [facility].

## 2018-05-07 ENCOUNTER — Encounter (HOSPITAL_COMMUNITY)
Admission: RE | Admit: 2018-05-07 | Discharge: 2018-05-07 | Disposition: A | Discharge status: Home / Self Care | Payer: Medicare HMO | Source: Ambulatory Visit | Attending: Pulmonary Disease | Admitting: Pulmonary Disease

## 2018-05-07 DIAGNOSIS — J9611 Chronic respiratory failure with hypoxia: Secondary | ICD-10-CM | POA: Diagnosis not present

## 2018-05-07 NOTE — Progress Notes (Signed)
Daily Session Note  Patient Details  Name: JKAYLA SPIEWAK MRN: [id number] Date of Birth: October 23, 1940 Referring Provider:     Pulmonary Rehab Walk Test from 03/05/2018 in [place]  Referring Provider  Elsworth Soho      Encounter Date: 05/07/2018  Check In: Session Check In - 05/07/18 1103      Check-In   Supervising physician immediately available to respond to emergencies  [facility] immediately available    Physician(s)  Dr. Tyrell Antonio    Location  [facility]    Staff Present  Maurice Small, RN, BSN;Molly DiVincenzo, MS, ACSM RCEP, Exercise Physiologist;Dalton Kris Mouton, MS, Exercise Physiologist;Lisa Ysidro Evert, RN    Medication changes reported      No    Fall or balance concerns reported     No    Tobacco Cessation  No Change    Warm-up and Cool-down  Performed as group-led instruction    Resistance Training Performed  Yes    VAD Patient?  No    PAD/SET Patient?  No      Pain Assessment   Currently in Pain?  No/denies    Pain Score  0-No pain    Multiple Pain Sites  No       Capillary Blood Glucose: No results found for this or any previous visit (from the past 24 hour(s)).    Social History   Tobacco Use  Smoking Status Former Smoker  . Types: Cigarettes  . Last attempt to quit: 2003  . Years since quitting: 16.8  Smokeless Tobacco Never Used    Goals Met:  Proper associated with RPD/PD & O2 Sat Exercise tolerated well  Goals Unmet:  Not Applicable  Comments: Service time is from 1030 to 1230    Dr. Rush Farmer is Medical Director for Pulmonary Rehab at [facility].

## 2018-05-08 DIAGNOSIS — J449 Chronic obstructive pulmonary disease, unspecified: Secondary | ICD-10-CM | POA: Diagnosis not present

## 2018-05-08 DIAGNOSIS — J9611 Chronic respiratory failure with hypoxia: Secondary | ICD-10-CM | POA: Diagnosis not present

## 2018-05-08 DIAGNOSIS — J432 Centrilobular emphysema: Secondary | ICD-10-CM | POA: Diagnosis not present

## 2018-05-11 ENCOUNTER — Other Ambulatory Visit: Payer: Self-pay | Admitting: Pulmonary Disease

## 2018-05-12 ENCOUNTER — Encounter (HOSPITAL_COMMUNITY)
Admission: RE | Admit: 2018-05-12 | Discharge: 2018-05-12 | Disposition: A | Discharge status: Home / Self Care | Payer: Medicare HMO | Source: Ambulatory Visit | Attending: Pulmonary Disease | Admitting: Pulmonary Disease

## 2018-05-12 ENCOUNTER — Telehealth: Payer: Self-pay | Admitting: Pulmonary Disease

## 2018-05-12 VITALS — Wt 166.2 lb

## 2018-05-12 DIAGNOSIS — J9611 Chronic respiratory failure with hypoxia: Secondary | ICD-10-CM | POA: Diagnosis not present

## 2018-05-12 MED ORDER — ALBUTEROL SULFATE HFA 108 (90 BASE) MCG/ACT IN AERS: 1.0000 | INHALATION_SPRAY | Freq: Four times a day (QID) | RESPIRATORY_TRACT | 5 refills | Status: DC | PRN

## 2018-05-12 NOTE — Progress Notes (Signed)
Daily Session Note  Patient Details  Name: Rebecca [name] MRN: [id number] Date of Birth: Aug 16, 1940 Referring Provider:     Pulmonary Rehab Walk Test from 03/05/2018 in [place]  Referring Provider  Elsworth Soho      Encounter Date: 05/12/2018  Check In: Session Check In - 05/12/18 1025      Check-In   Supervising physician immediately available to respond to emergencies  [facility] immediately available    Physician(s)  Dr. Tyrell Antonio    Location  [facility]    Staff Present  Maurice Small, RN, BSN;Molly DiVincenzo, MS, ACSM RCEP, Exercise Physiologist;Dalton Kris Mouton, MS, Exercise Physiologist;Kaesha Kirsch Ysidro Evert, Felipe Drone, RN, MHA    Medication changes reported      No    Fall or balance concerns reported     No    Tobacco Cessation  No Change    Warm-up and Cool-down  Performed as group-led instruction    Resistance Training Performed  Yes    VAD Patient?  No    PAD/SET Patient?  No      Pain Assessment   Currently in Pain?  No/denies    Multiple Pain Sites  No       Capillary Blood Glucose: No results found for this or any previous visit (from the past 24 hour(s)). POCT Glucose - 05/12/18 1217      POCT Blood Glucose   Pre-Exercise  189 mg/dL    Post-Exercise  89 mg/dL      Exercise Prescription Changes - 05/12/18 1200      Response to Exercise   Blood Pressure (Admit)  140/60    Blood Pressure (Exercise)  144/84    Blood Pressure (Exit)  130/84    Heart Rate (Admit)  78 bpm    Heart Rate (Exercise)  93 bpm    Heart Rate (Exit)  78 bpm    Oxygen Saturation (Admit)  95 %    Oxygen Saturation (Exercise)  91 %    Oxygen Saturation (Exit)  94 %    Rating of Perceived Exertion (Exercise)  14    Perceived Dyspnea (Exercise)  3    Duration  Continue with 45 min of aerobic exercise without signs/symptoms of physical distress.    Intensity  THRR unchanged      Progression   Progression  Continue to  progress workloads to maintain intensity without signs/symptoms of physical distress.      Resistance Training   Training Prescription  Yes    Weight  orange bands    Reps  10-15    Time  10 Minutes      Oxygen   Oxygen  Continuous    Liters  4      NuStep   Level  3    SPM  80    Minutes  17    METs  2.2      Track   Laps  3    Minutes  17       Social History   Tobacco Use  Smoking Status Former Smoker  . Types: Cigarettes  . Last attempt to quit: 2003  . Years since quitting: 16.8  Smokeless Tobacco Never Used    Goals Met:  Exercise tolerated well No report of cardiac concerns or symptoms Strength training completed today  Goals Unmet:  Not Applicable  Comments: Service time is from 1030 to 1200    Dr. Rush Farmer is Medical Director for Pulmonary  Rehab at [facility].

## 2018-05-12 NOTE — Telephone Encounter (Signed)
Called and spoke with pt who stated she was needing a refill on her proventil inhaler due to the one she has being expired.  I verified pt's preferred pharmacy and sent refill in for her.nothing further needed.

## 2018-05-14 ENCOUNTER — Encounter (HOSPITAL_COMMUNITY)
Admission: RE | Admit: 2018-05-14 | Discharge: 2018-05-14 | Disposition: A | Discharge status: Home / Self Care | Payer: Medicare HMO | Source: Ambulatory Visit | Attending: Pulmonary Disease | Admitting: Pulmonary Disease

## 2018-05-14 DIAGNOSIS — J9611 Chronic respiratory failure with hypoxia: Secondary | ICD-10-CM

## 2018-05-14 NOTE — Progress Notes (Signed)
Daily Session Note  Patient Details  Name: Rebecca [name] MRN: [id number] Date of Birth: 1940/08/19 Referring Provider:     Pulmonary Rehab Walk Test from 03/05/2018 in [place]  Referring Provider  Elsworth Soho      Encounter Date: 05/14/2018  Check In: Session Check In - 05/14/18 1051      Check-In   Supervising physician immediately available to respond to emergencies  [facility] immediately available    Physician(s)  Dr. Algis Liming    Location  [facility]    Staff Present  Maurice Small, RN, BSN;Molly DiVincenzo, MS, ACSM RCEP, Exercise Physiologist;Dalton Kris Mouton, MS, Exercise Physiologist;Lisa Ysidro Evert, Felipe Drone, RN, MHA    Medication changes reported      No    Fall or balance concerns reported     No    Tobacco Cessation  No Change    Warm-up and Cool-down  Performed as group-led instruction    Resistance Training Performed  Yes    VAD Patient?  No    PAD/SET Patient?  No      Pain Assessment   Currently in Pain?  No/denies    Multiple Pain Sites  No       Capillary Blood Glucose: No results found for this or any previous visit (from the past 24 hour(s)).    Social History   Tobacco Use  Smoking Status Former Smoker  . Types: Cigarettes  . Last attempt to quit: 2003  . Years since quitting: 16.8  Smokeless Tobacco Never Used    Goals Met:  Proper associated with RPD/PD & O2 Sat Exercise tolerated well  Goals Unmet:  Not Applicable  Comments: Service time is from 1030 to 1200    Dr. Rush Farmer is Medical Director for Pulmonary Rehab at [facility].

## 2018-05-19 ENCOUNTER — Encounter (HOSPITAL_COMMUNITY)
Admission: RE | Admit: 2018-05-19 | Discharge: 2018-05-19 | Disposition: A | Discharge status: Home / Self Care | Payer: Medicare HMO | Source: Ambulatory Visit | Attending: Pulmonary Disease | Admitting: Pulmonary Disease

## 2018-05-19 DIAGNOSIS — J9611 Chronic respiratory failure with hypoxia: Secondary | ICD-10-CM | POA: Diagnosis not present

## 2018-05-19 NOTE — Progress Notes (Signed)
Daily Session Note  Patient Details  Name: Rebecca [name] MRN: [id number] Date of Birth: 08/17/1940 Referring Provider:     Pulmonary Rehab Walk Test from 03/05/2018 in [place]  Referring Provider  Elsworth Soho      Encounter Date: 05/19/2018  Check In:   Capillary Blood Glucose: No results found for this or any previous visit (from the past 24 hour(s)).    Social History   Tobacco Use  Smoking Status Former Smoker  . Types: Cigarettes  . Last attempt to quit: 2003  . Years since quitting: 16.8  Smokeless Tobacco Never Used    Goals Met:  Proper associated with RPD/PD & O2 Sat Exercise tolerated well  Goals Unmet:  Not Applicable  Comments: Service time is from 1030 to 1210    Dr. Rush Farmer is Medical Director for Pulmonary Rehab at [facility].

## 2018-05-21 ENCOUNTER — Encounter (HOSPITAL_COMMUNITY)
Admission: RE | Admit: 2018-05-21 | Discharge: 2018-05-21 | Disposition: A | Discharge status: Home / Self Care | Payer: Medicare HMO | Source: Ambulatory Visit | Attending: Pulmonary Disease | Admitting: Pulmonary Disease

## 2018-05-21 DIAGNOSIS — J9611 Chronic respiratory failure with hypoxia: Secondary | ICD-10-CM | POA: Diagnosis not present

## 2018-05-21 NOTE — Progress Notes (Signed)
Daily Session Note  Patient Details  Name: Rebecca [name] MRN: [id number] Date of Birth: 1941/01/14 Referring Provider:     Pulmonary Rehab Walk Test from 03/05/2018 in [place]  Referring Provider  Elsworth Soho      Encounter Date: 05/21/2018  Check In: Session Check In - 05/21/18 1030      Check-In   Supervising physician immediately available to respond to emergencies  [facility] immediately available    Physician(s)  Dr. Starla Link    Location  [facility]    Staff Present  Rosebud Poles, RN, BSN;Carlette Wilber Oliphant, RN, Bjorn Loser, MS, Exercise Physiologist;Annedrea Rosezella [place], RN, MHA    Medication changes reported      No    Fall or balance concerns reported     No    Tobacco Cessation  No Change    Warm-up and Cool-down  Performed as group-led instruction    Resistance Training Performed  Yes    VAD Patient?  No    PAD/SET Patient?  No      Pain Assessment   Currently in Pain?  No/denies    Multiple Pain Sites  No       Capillary Blood Glucose: No results found for this or any previous visit (from the past 24 hour(s)).    Social History   Tobacco Use  Smoking Status Former Smoker  . Types: Cigarettes  . Last attempt to quit: 2003  . Years since quitting: 16.8  Smokeless Tobacco Never Used    Goals Met:  Proper associated with RPD/PD & O2 Sat Exercise tolerated well  Goals Unmet:  Not Applicable  Comments: Service time is from 1030 to 1205    Dr. Rush Farmer is Medical Director for Pulmonary Rehab at [facility].

## 2018-05-25 ENCOUNTER — Telehealth: Payer: Self-pay | Admitting: Pulmonary Disease

## 2018-05-25 NOTE — Telephone Encounter (Signed)
Called and spoke with RJ at [facility], confirmed patient info as well fax number. Office note faxed. Nothing further is needed at this time.

## 2018-05-26 ENCOUNTER — Encounter (HOSPITAL_COMMUNITY)
Admission: RE | Admit: 2018-05-26 | Discharge: 2018-05-26 | Disposition: A | Discharge status: Home / Self Care | Payer: Medicare HMO | Source: Ambulatory Visit | Attending: Pulmonary Disease | Admitting: Pulmonary Disease

## 2018-05-26 VITALS — Wt 168.0 lb

## 2018-05-26 DIAGNOSIS — J9611 Chronic respiratory failure with hypoxia: Secondary | ICD-10-CM | POA: Diagnosis not present

## 2018-05-26 NOTE — Progress Notes (Signed)
Daily Session Note  Patient Details  Name: Rebecca [name] MRN: [id number] Date of Birth: 1941-05-04 Referring Provider:     Pulmonary Rehab Walk Test from 03/05/2018 in [place]  Referring Provider  Elsworth Soho      Encounter Date: 05/26/2018  Check In: Session Check In - 05/26/18 1030      Check-In   Supervising physician immediately available to respond to emergencies  [facility] immediately available    Physician(s)  Dr. Starla Link    Location  [facility]    Staff Present  Rosebud Poles, RN, BSN;Carlette Wilber Oliphant, RN, Bjorn Loser, MS, Exercise Physiologist;Annedrea Rosezella [place], RN, MHA;Lisa Ysidro Evert, RN    Medication changes reported      No    Fall or balance concerns reported     No    Tobacco Cessation  No Change    Warm-up and Cool-down  Performed as group-led instruction    Resistance Training Performed  Yes    VAD Patient?  No    PAD/SET Patient?  No      Pain Assessment   Currently in Pain?  No/denies    Multiple Pain Sites  No       Capillary Blood Glucose: No results found for this or any previous visit (from the past 24 hour(s)).  Exercise Prescription Changes - 05/26/18 1300      Response to Exercise   Blood Pressure (Admit)  138/68    Blood Pressure (Exercise)  160/80    Blood Pressure (Exit)  132/88    Heart Rate (Admit)  78 bpm    Heart Rate (Exercise)  99 bpm    Heart Rate (Exit)  95 bpm    Oxygen Saturation (Admit)  95 %    Oxygen Saturation (Exercise)  93 %    Oxygen Saturation (Exit)  95 %    Rating of Perceived Exertion (Exercise)  16    Perceived Dyspnea (Exercise)  4    Duration  Continue with 45 min of aerobic exercise without signs/symptoms of physical distress.    Intensity  THRR unchanged      Progression   Progression  Continue to progress workloads to maintain intensity without signs/symptoms of physical distress.      Resistance Training   Training Prescription  Yes    Weight   orange bands    Reps  10-15    Time  10 Minutes      Oxygen   Oxygen  Continuous    Liters  4      Recumbant Bike   Level  2    Minutes  17      NuStep   Level  3    SPM  80    Minutes  17    METs  2      Track   Minutes  17       Social History   Tobacco Use  Smoking Status Former Smoker  . Types: Cigarettes  . Last attempt to quit: 2003  . Years since quitting: 16.8  Smokeless Tobacco Never Used    Goals Met:  Proper associated with RPD/PD & O2 Sat Exercise tolerated well  Goals Unmet:  Not Applicable  Comments: Service time is from 1030 to 1200    Dr. Rush Farmer is Medical Director for Pulmonary Rehab at [facility].

## 2018-05-27 NOTE — Progress Notes (Signed)
Pulmonary Individual Treatment Plan  Patient Details  Name: Rebecca [name] MRN: [id number] Date of Birth: Dec 02, 1940 Referring Provider:     Pulmonary Rehab Walk Test from 03/05/2018 in [facility]  Referring Provider  Elsworth Soho      Initial Encounter Date:    Pulmonary Rehab Walk Test from 03/05/2018 in [facility]  Date  03/05/18      Visit Diagnosis: Chronic respiratory failure with hypoxia ([place])  Patient's Home Medications on Admission:   Current Outpatient Medications:  .  albuterol (PROVENTIL HFA;VENTOLIN HFA) 108 (90 Base) MCG/ACT inhaler, Inhale 1-2 puffs into the lungs every 6 (six) hours as needed for wheezing or shortness of breath., Disp: 1 Inhaler, Rfl: 5 .  ANORO ELLIPTA 62.5-25 MCG/INH AEPB, INHALE 1 PUFF INTO THE LUNGS DAILY., Disp: 180 each, Rfl: 3 .  azithromycin (ZITHROMAX) 250 MG tablet, Take 2 tablets the first day and then 1 tablet a day until gone, Disp: 6 tablet, Rfl: 0 .  Calcium Carbonate-Vitamin D (CALCIUM 600+D) 600-200 MG-UNIT TABS, Take by mouth., Disp: , Rfl:  .  carvedilol (COREG) 25 MG tablet, 2 (two) times daily. , Disp: , Rfl:  .  metFORMIN (GLUCOPHAGE) 500 MG tablet, daily with supper. , Disp: , Rfl:   Past Medical History: Past Medical History:  Diagnosis Date  . Congestive heart disease ([place])   . COPD (chronic obstructive pulmonary disease) ([place])   . Diabetes ([place])   . Hyperlipemia   . Osteoporosis   . Primary hypertension     Tobacco Use: Social History   Tobacco Use  Smoking Status Former Smoker  . Types: Cigarettes  . Last attempt to quit: 2003  . Years since quitting: 16.8  Smokeless Tobacco Never Used    Labs: Recent Review Flowsheet Data    There is no flowsheet data to display.      Capillary Blood Glucose: No results found for: GLUCAP POCT Glucose    Row Name 03/17/18 1252 03/31/18 1256 04/23/18 0744 05/12/18 1217       POCT Blood Glucose    Pre-Exercise  105 mg/dL  137 mg/dL  110 mg/dL  189 mg/dL    Post-Exercise  115 mg/dL  92 mg/dL  104 mg/dL  89 mg/dL       Pulmonary Assessment Scores: Pulmonary Assessment Scores    Row Name 03/05/18 1311         ADL UCSD   ADL Phase  Entry       mMRC Score   mMRC Score  1        Pulmonary Function Assessment:   Exercise Target Goals: Exercise Program Goal: Individual exercise prescription set using results from initial 6 min walk test and THRR while considering  patient's activity barriers and safety.   Exercise Prescription Goal: Initial exercise prescription builds to 30-45 minutes a day of aerobic activity, 2-3 days per week.  Home exercise guidelines will be given to patient during program as part of exercise prescription that the participant will acknowledge.  Activity Barriers & Risk Stratification: Activity Barriers & Cardiac Risk Stratification - 02/27/18 1027      Activity Barriers & Cardiac Risk Stratification   Activity Barriers  Back Problems;History of Falls;Shortness of Breath;Deconditioning;Assistive Device;Joint Problems   Right knee cap surgery   Cardiac Risk Stratification  High       6 Minute Walk: 6 Minute Walk    Row Name 03/05/18 1306  6 Minute Walk   Phase  Initial     Distance  1000 feet     Walk Time  - 5 minutes 30 seconds     # of Rest Breaks  1 30 second break standing     MPH  1.89     METS  2.38     RPE  12     Perceived Dyspnea   3     Symptoms  No     Resting HR  78 bpm     Resting BP  124/66     Resting Oxygen Saturation   97 %     Exercise Oxygen Saturation  during 6 min walk  90 %     Max Ex. HR  97 bpm     Max Ex. BP  124/84       Interval HR   1 Minute HR  84     2 Minute HR  91     3 Minute HR  95     4 Minute HR  97     5 Minute HR  94     6 Minute HR  94     2 Minute Post HR  85     Interval Heart Rate?  Yes       Interval Oxygen   Interval Oxygen?  Yes     Baseline Oxygen Saturation %  97 %      1 Minute Oxygen Saturation %  97 %     1 Minute Liters of Oxygen  4 L     2 Minute Oxygen Saturation %  92 %     2 Minute Liters of Oxygen  4 L     3 Minute Oxygen Saturation %  90 %     3 Minute Liters of Oxygen  4 L     4 Minute Oxygen Saturation %  93 %     4 Minute Liters of Oxygen  4 L     5 Minute Oxygen Saturation %  94 %     5 Minute Liters of Oxygen  4 L     6 Minute Oxygen Saturation %  94 %     6 Minute Liters of Oxygen  4 L     2 Minute Post Oxygen Saturation %  100 %     2 Minute Post Liters of Oxygen  4 L        Oxygen Initial Assessment: Oxygen Initial Assessment - 03/05/18 1311      Initial 6 min Walk   Oxygen Used  Continuous;E-Tanks    Liters per minute  4      Program Oxygen Prescription   Program Oxygen Prescription  Continuous;E-Tanks    Liters per minute  4       Oxygen Re-Evaluation: Oxygen Re-Evaluation    Row Name 03/30/18 1631 04/28/18 1608 05/27/18 0725         Program Oxygen Prescription   Program Oxygen Prescription  Continuous;E-Tanks  Continuous;E-Tanks  Continuous;E-Tanks     Liters per minute  _0 Home Oxygen   Home Oxygen Device  E-Tanks;Home Concentrator  E-Tanks;Home Concentrator  E-Tanks;Home Concentrator     Sleep Oxygen Prescription  Continuous  Continuous  Continuous     Liters per minute  _1 Home Exercise Oxygen Prescription  Pulsed  Pulsed  Pulsed  Liters per minute  _0 Home at Rest Exercise Oxygen Prescription  Pulsed  Pulsed  Pulsed     Liters per minute  _1 Compliance with Home Oxygen Use  Yes  Yes  Yes       Goals/Expected Outcomes   Short Term Goals  To learn and exhibit compliance with exercise, home and travel O2 prescription;To learn and understand importance of monitoring SPO2 with pulse oximeter and demonstrate accurate use of the pulse oximeter.;To learn and understand importance of maintaining oxygen saturations>88%;To learn and demonstrate proper pursed lip breathing  techniques or other breathing techniques.;To learn and demonstrate proper use of respiratory medications  To learn and exhibit compliance with exercise, home and travel O2 prescription;To learn and understand importance of monitoring SPO2 with pulse oximeter and demonstrate accurate use of the pulse oximeter.;To learn and understand importance of maintaining oxygen saturations>88%;To learn and demonstrate proper pursed lip breathing techniques or other breathing techniques.;To learn and demonstrate proper use of respiratory medications  To learn and exhibit compliance with exercise, home and travel O2 prescription;To learn and understand importance of monitoring SPO2 with pulse oximeter and demonstrate accurate use of the pulse oximeter.;To learn and understand importance of maintaining oxygen saturations>88%;To learn and demonstrate proper pursed lip breathing techniques or other breathing techniques.;To learn and demonstrate proper use of respiratory medications     Long  Term Goals  Exhibits compliance with exercise, home and travel O2 prescription;Verbalizes importance of monitoring SPO2 with pulse oximeter and return demonstration;Maintenance of O2 saturations>88%;Exhibits proper breathing techniques, such as pursed lip breathing or other method taught during program session;Compliance with respiratory medication;Demonstrates proper use of MDI's  Exhibits compliance with exercise, home and travel O2 prescription;Verbalizes importance of monitoring SPO2 with pulse oximeter and return demonstration;Maintenance of O2 saturations>88%;Exhibits proper breathing techniques, such as pursed lip breathing or other method taught during program session;Compliance with respiratory medication;Demonstrates proper use of MDI's  Exhibits compliance with exercise, home and travel O2 prescription;Verbalizes importance of monitoring SPO2 with pulse oximeter and return demonstration;Maintenance of O2 saturations>88%;Exhibits  proper breathing techniques, such as pursed lip breathing or other method taught during program session;Compliance with respiratory medication;Demonstrates proper use of MDI's     Goals/Expected Outcomes  compliance  compliance  compliance        Oxygen Discharge (Final Oxygen Re-Evaluation): Oxygen Re-Evaluation - 05/27/18 0725      Program Oxygen Prescription   Program Oxygen Prescription  Continuous;E-Tanks    Liters per minute  4      Home Oxygen   Home Oxygen Device  E-Tanks;Home Concentrator    Sleep Oxygen Prescription  Continuous    Liters per minute  4    Home Exercise Oxygen Prescription  Pulsed    Liters per minute  4    Home at Rest Exercise Oxygen Prescription  Pulsed    Liters per minute  3    Compliance with Home Oxygen Use  Yes      Goals/Expected Outcomes   Short Term Goals  To learn and exhibit compliance with exercise, home and travel O2 prescription;To learn and understand importance of monitoring SPO2 with pulse oximeter and demonstrate accurate use of the pulse oximeter.;To learn and understand importance of maintaining oxygen saturations>88%;To learn and demonstrate proper pursed lip breathing techniques or other breathing techniques.;To learn and demonstrate proper use of respiratory medications    Long  Term Goals  Exhibits compliance with exercise,  home and travel O2 prescription;Verbalizes importance of monitoring SPO2 with pulse oximeter and return demonstration;Maintenance of O2 saturations>88%;Exhibits proper breathing techniques, such as pursed lip breathing or other method taught during program session;Compliance with respiratory medication;Demonstrates proper use of MDI's    Goals/Expected Outcomes  compliance       Initial Exercise Prescription: Initial Exercise Prescription - 03/05/18 1300      Date of Initial Exercise RX and Referring Provider   Date  03/05/18    Referring Provider  Elsworth Soho      Oxygen   Oxygen  Continuous    Liters  4       Recumbant Bike   Level  2    Watts  10    Minutes  17      NuStep   Level  2    SPM  80    Minutes  17    METs  1.5      Track   Laps  5    Minutes  17      Prescription Details   Frequency (times per week)  2    Duration  Progress to 45 minutes of aerobic exercise without signs/symptoms of physical distress      Intensity   THRR 40-80% of Max Heartrate  57-114    Ratings of Perceived Exertion  11-13    Perceived Dyspnea  0-4      Progression   Progression  Continue progressive overload as per policy without signs/symptoms or physical distress.      Resistance Training   Training Prescription  Yes    Weight  orange bands    Reps  10-15       Perform Capillary Blood Glucose checks as needed.  Exercise Prescription Changes:  Exercise Prescription Changes    Row Name 03/17/18 1200 03/31/18 1200 04/02/18 1008 04/09/18 1606 04/23/18 0748     Response to Exercise   Blood Pressure (Admit)  134/70  130/80  -  132/68  132/74   Blood Pressure (Exercise)  150/80  120/84  -  148/80  140/88   Blood Pressure (Exit)  122/62  122/80  -  118/82  150/78   Heart Rate (Admit)  72 bpm  72 bpm  -  71 bpm  77 bpm   Heart Rate (Exercise)  87 bpm  90 bpm  -  92 bpm  95 bpm   Heart Rate (Exit)  79 bpm  81 bpm  -  79 bpm  73 bpm   Oxygen Saturation (Admit)  97 %  96 %  -  96 %  96 %   Oxygen Saturation (Exercise)  94 %  92 %  -  94 %  91 %   Oxygen Saturation (Exit)  99 %  94 %  -  94 %  93 %   Rating of Perceived Exertion (Exercise)  13  13  -  15  12   Perceived Dyspnea (Exercise)  2  2  -  3  2   Duration  Progress to 45 minutes of aerobic exercise without signs/symptoms of physical distress  Progress to 45 minutes of aerobic exercise without signs/symptoms of physical distress  -  Continue with 45 min of aerobic exercise without signs/symptoms of physical distress.  Continue with 45 min of aerobic exercise without signs/symptoms of physical distress.   Intensity  Other (comment) 40-80%  of HRR  Other (comment) 40-80% of HRR  -  THRR unchanged  THRR unchanged  Progression   Progression  Continue to progress workloads to maintain intensity without signs/symptoms of physical distress.  Continue to progress workloads to maintain intensity without signs/symptoms of physical distress.  -  Continue to progress workloads to maintain intensity without signs/symptoms of physical distress.  Continue to progress workloads to maintain intensity without signs/symptoms of physical distress.     Resistance Training   Training Prescription  Yes  Yes  -  Yes  Yes   Weight  orange bands  orange bands  -  orange bands  orange bands   Reps  10-15  10-15  -  10-15  10-15   Time  10 Minutes  10 Minutes  -  -  -     Oxygen   Oxygen  Continuous  Continuous  -  Continuous  Continuous   Liters  4  4  -  4  4     Recumbant Bike   Level  2  2  -  2  -   Watts  10  10  -  -  -   Minutes  17  17  -  17  -     NuStep   Level  2  3  -  -  3   SPM  80  80  -  -  80   Minutes  17  17  -  -  17   METs  1.4  1.7  -  -  1.8     Track   Laps  5  6  -  6  8   Minutes  17  17  -  17  17     Home Exercise Plan   Plans to continue exercise at  -  -  Home (comment)  -  -   Frequency  -  -  Add 2 additional days to program exercise sessions.  -  -   Row Name 05/12/18 1200 05/26/18 1300           Response to Exercise   Blood Pressure (Admit)  140/60  138/68      Blood Pressure (Exercise)  144/84  160/80      Blood Pressure (Exit)  130/84  132/88      Heart Rate (Admit)  78 bpm  78 bpm      Heart Rate (Exercise)  93 bpm  99 bpm      Heart Rate (Exit)  78 bpm  95 bpm      Oxygen Saturation (Admit)  95 %  95 %      Oxygen Saturation (Exercise)  91 %  93 %      Oxygen Saturation (Exit)  94 %  95 %      Rating of Perceived Exertion (Exercise)  14  16      Perceived Dyspnea (Exercise)  3  4      Duration  Continue with 45 min of aerobic exercise without signs/symptoms of physical distress.   Continue with 45 min of aerobic exercise without signs/symptoms of physical distress.      Intensity  THRR unchanged  THRR unchanged        Progression   Progression  Continue to progress workloads to maintain intensity without signs/symptoms of physical distress.  Continue to progress workloads to maintain intensity without signs/symptoms of physical distress.        Resistance Training   Training Prescription  Yes  Yes  Weight  orange bands  orange bands      Reps  10-15  10-15      Time  10 Minutes  10 Minutes        Oxygen   Oxygen  Continuous  Continuous      Liters  4  4        Recumbant Bike   Level  -  2      Minutes  -  17        NuStep   Level  3  3      SPM  80  80      Minutes  17  17      METs  2.2  2        Track   Laps  3  -      Minutes  17  17         Exercise Comments:  Exercise Comments    Row Name 04/03/18 1008           Exercise Comments  Home exercise completed          Exercise Goals and Review:  Exercise Goals    Row Name 02/27/18 1029             Exercise Goals   Increase Physical Activity  Yes       Intervention  Provide advice, education, support and counseling about physical activity/exercise needs.;Develop an individualized exercise prescription for aerobic and resistive training based on initial evaluation findings, risk stratification, comorbidities and participant's personal goals.       Expected Outcomes  Short Term: Attend rehab on a regular basis to increase amount of physical activity.;Long Term: Add in home exercise to make exercise part of routine and to increase amount of physical activity.;Long Term: Exercising regularly at least 3-5 days a week.       Increase Strength and Stamina  Yes       Intervention  Provide advice, education, support and counseling about physical activity/exercise needs.;Develop an individualized exercise prescription for aerobic and resistive training based on initial evaluation findings,  risk stratification, comorbidities and participant's personal goals.       Expected Outcomes  Short Term: Increase workloads from initial exercise prescription for resistance, speed, and METs.;Short Term: Perform resistance training exercises routinely during rehab and add in resistance training at home;Long Term: Improve cardiorespiratory fitness, muscular endurance and strength as measured by increased METs and functional capacity (6MWT)       Able to understand and use rate of perceived exertion (RPE) scale  Yes       Intervention  Provide education and explanation on how to use RPE scale       Expected Outcomes  Short Term: Able to use RPE daily in rehab to express subjective intensity level;Long Term:  Able to use RPE to guide intensity level when exercising independently       Able to understand and use Dyspnea scale  Yes       Intervention  Provide education and explanation on how to use Dyspnea scale       Expected Outcomes  Short Term: Able to use Dyspnea scale daily in rehab to express subjective sense of shortness of breath during exertion;Long Term: Able to use Dyspnea scale to guide intensity level when exercising independently       Knowledge and understanding of Target Heart Rate Range (THRR)  Yes       Intervention  Provide education  and explanation of THRR including how the numbers were predicted and where they are located for reference       Expected Outcomes  Short Term: Able to state/look up THRR;Short Term: Able to use daily as guideline for intensity in rehab;Long Term: Able to use THRR to govern intensity when exercising independently       Understanding of Exercise Prescription  Yes       Intervention  Provide education, explanation, and written materials on patient's individual exercise prescription       Expected Outcomes  Short Term: Able to explain program exercise prescription;Long Term: Able to explain home exercise prescription to exercise independently           Exercise Goals Re-Evaluation : Exercise Goals Re-Evaluation    Row Name 03/30/18 1632 04/28/18 1609 05/27/18 0726         Exercise Goal Re-Evaluation   Exercise Goals Review  Increase Physical Activity;Increase Strength and Stamina;Able to understand and use rate of perceived exertion (RPE) scale;Able to understand and use Dyspnea scale;Knowledge and understanding of Target Heart Rate Range (THRR);Understanding of Exercise Prescription  Increase Physical Activity;Increase Strength and Stamina;Able to understand and use rate of perceived exertion (RPE) scale;Able to understand and use Dyspnea scale;Knowledge and understanding of Target Heart Rate Range (THRR);Understanding of Exercise Prescription  Increase Physical Activity;Increase Strength and Stamina;Able to understand and use rate of perceived exertion (RPE) scale;Able to understand and use Dyspnea scale;Knowledge and understanding of Target Heart Rate Range (THRR);Understanding of Exercise Prescription     Comments  Patient has only attended 5 rehab sessions. Patient's progress is going to be slow. However, she started off walking 4 laps (200 ft each) in 15 minutes to now 8 laps. MET average places her in a low level. Will cont. to monitor and motivate.  Patient is progressing slowly. She has a lot of complaints regarding pain. She is able to walk 8 laps (200 ft) in 15 minutes. MET average placees her in a low level. She is not open to workload changes. She is hesitant to exercise at home. Will cont to monitor and motivate as much as possible.  Pt is progressing slowly. Pt has shown that she can walk 8 laps (1 lap=200 ft), but often times only walks 3-5 due to lack of motivation. Pt is resistant to workload increases. Pt exercises at low MET levels (~2). Will continue to monitor and motivate as much as possible.      Expected Outcomes  Through exercise at rehab and at home, the patient will decrease shortness of breath with daily activities and  feel confident in carrying out an exercise regime at home.   Through exercise at rehab and at home, the patient will decrease shortness of breath with daily activities and feel confident in carrying out an exercise regime at home.   Through exercise at rehab and at home, the patient will decrease shortness of breath with daily activities and feel confident in carrying out an exercise regime at home.         Discharge Exercise Prescription (Final Exercise Prescription Changes): Exercise Prescription Changes - 05/26/18 1300      Response to Exercise   Blood Pressure (Admit)  138/68    Blood Pressure (Exercise)  160/80    Blood Pressure (Exit)  132/88    Heart Rate (Admit)  78 bpm    Heart Rate (Exercise)  99 bpm    Heart Rate (Exit)  95 bpm    Oxygen Saturation (Admit)  95 %    Oxygen Saturation (Exercise)  93 %    Oxygen Saturation (Exit)  95 %    Rating of Perceived Exertion (Exercise)  16    Perceived Dyspnea (Exercise)  4    Duration  Continue with 45 min of aerobic exercise without signs/symptoms of physical distress.    Intensity  THRR unchanged      Progression   Progression  Continue to progress workloads to maintain intensity without signs/symptoms of physical distress.      Resistance Training   Training Prescription  Yes    Weight  orange bands    Reps  10-15    Time  10 Minutes      Oxygen   Oxygen  Continuous    Liters  4      Recumbant Bike   Level  2    Minutes  17      NuStep   Level  3    SPM  80    Minutes  17    METs  2      Track   Minutes  17       Nutrition:  Target Goals: Understanding of nutrition guidelines, daily intake of sodium <1555m, cholesterol <2033m calories 30% from fat and 7% or less from saturated fats, daily to have 5 or more servings of fruits and vegetables.  Biometrics:    Nutrition Therapy Plan and Nutrition Goals: Nutrition Therapy & Goals - 03/03/18 0938      Nutrition Therapy   Diet  general healthful       Personal Nutrition Goals   Nutrition Goal  Identify food quantities necessary to achieve wt loss of  -2# per week to a goal wt loss of 6-10 lbs at graduation from pulmonary rehab.    Personal Goal #2  Describe the benefit of including fruits, vegetables, whole grains, and low-fat dairy products in a healthy meal plan.      Intervention Plan   Intervention  Prescribe, educate and counsel regarding individualized specific dietary modifications aiming towards targeted core components such as weight, hypertension, lipid management, diabetes, heart failure and other comorbidities.    Expected Outcomes  Short Term Goal: Understand basic principles of dietary content, such as calories, fat, sodium, cholesterol and nutrients.       Nutrition Assessments: Nutrition Assessments - 03/03/18 0941      Rate Your Plate Scores   Pre Score  50       Nutrition Goals Re-Evaluation: Nutrition Goals Re-Evaluation    [place] 03/03/18 0938             Goals   Current Weight  160 lb 15 oz (73 kg)          Nutrition Goals Discharge (Final Nutrition Goals Re-Evaluation): Nutrition Goals Re-Evaluation - 03/03/18 0938      Goals   Current Weight  160 lb 15 oz (73 kg)       Psychosocial: Target Goals: Acknowledge presence or absence of significant depression and/or stress, maximize coping skills, provide positive support system. Participant is able to verbalize types and ability to use techniques and skills needed for reducing stress and depression.  Initial Review & Psychosocial Screening: Initial Psych Review & Screening - 02/27/18 1036      Initial Review   Current issues with  History of Depression      Family Dynamics   Good Support System?  Yes   [place] and  [place]   Comments  support of family and freinds at the independent living      Barriers   Psychosocial barriers to participate in program  There are no identifiable barriers or  psychosocial needs.      Screening Interventions   Interventions  Encouraged to exercise    Expected Outcomes  Short Term goal: Identification and review with participant of any Quality of Life or Depression concerns found by scoring the questionnaire.;Long Term goal: The participant improves quality of Life and PHQ9 Scores as seen by post scores and/or verbalization of changes       Quality of Life Scores:  Scores of 19 and below usually indicate a poorer quality of life in these areas.  A difference of  2-3 points is a clinically meaningful difference.  A difference of 2-3 points in the total score of the Quality of Life Index has been associated with significant improvement in overall quality of life, self-image, physical symptoms, and general health in studies assessing change in quality of life.  PHQ-9: Recent Review Flowsheet Data    Depression screen [facility] 2/9 02/27/2018   Decreased Interest 0   Down, Depressed, Hopeless 0   PHQ - 2 Score 0   Altered sleeping 0   Tired, decreased energy 1   Change in appetite 0   Feeling bad or failure about yourself  0   Trouble concentrating 0   Moving slowly or fidgety/restless 0   Suicidal thoughts 0   PHQ-9 Score 1   Difficult doing work/chores Not difficult at all     Interpretation of Total Score  Total Score Depression Severity:  1-4 = Minimal depression, 5-9 = Mild depression, 10-14 = Moderate depression, 15-19 = Moderately severe depression, 20-27 = Severe depression   Psychosocial Evaluation and Intervention: Psychosocial Evaluation - 05/27/18 1130      Psychosocial Evaluation & Interventions   Interventions  Relaxation education;Stress management education;Encouraged to exercise with the program and follow exercise prescription    Comments  no identifiable barriers Pt is quiet by nature but will engage in conversation when asked questions that require feedback    Expected Outcomes  Continue to maintain sense well being      Continue Psychosocial Services   Follow up required by staff       Psychosocial Re-Evaluation: Psychosocial Re-Evaluation    [place] Name 04/01/18 1540 04/29/18 1913 05/27/18 1130         Psychosocial Re-Evaluation   Current issues with  History of Depression  History of Depression  History of Depression     Comments  no identifiable barriers.  Pt is very happy at her assisted living facility and has made lots of freinds  no identifiable barriers.  Pt is very happy at her assisted living facility and has made lots of freinds  no identifiable barriers.  Pt is very happy at her assisted living facility and has made lots of freinds     Expected Outcomes  Pt will continue to display mental well being with positve and realistic outlook of her future  Pt will continue to display mental well being with positve and realistic outlook of her future  Pt will continue to display mental well being with positve and realistic outlook of her future     Interventions  Encouraged to attend Pulmonary Rehabilitation for the exercise;Stress management education;Relaxation education  Encouraged to attend Pulmonary Rehabilitation for the exercise;Stress management education;Relaxation education  Encouraged to attend Pulmonary Rehabilitation for the exercise;Stress management  education;Relaxation education     Continue Psychosocial Services   Follow up required by staff  No Follow up required  No Follow up required        Psychosocial Discharge (Final Psychosocial Re-Evaluation): Psychosocial Re-Evaluation - 05/27/18 1130      Psychosocial Re-Evaluation   Current issues with  History of Depression    Comments  no identifiable barriers.  Pt is very happy at her assisted living facility and has made lots of freinds    Expected Outcomes  Pt will continue to display mental well being with positve and realistic outlook of her future    Interventions  Encouraged to attend Pulmonary Rehabilitation for the exercise;Stress  management education;Relaxation education    Continue Psychosocial Services   No Follow up required       Education: Education Goals: Education classes will be provided on a weekly basis, covering required topics. Participant will state understanding/return demonstration of topics presented.  Learning Barriers/Preferences: Learning Barriers/Preferences - 02/27/18 1039      Learning Barriers/Preferences   Learning Barriers  Sight    Learning Preferences  Skilled Demonstration;Group Instruction;Individual Instruction;Audio;Written Material       Education Topics: Risk Factor Reduction:  -Group instruction that is supported by a PowerPoint presentation. Instructor discusses the definition of a risk factor, different risk factors for pulmonary disease, and how the heart and lungs work together.     Nutrition for Pulmonary Patient:  -Group instruction provided by PowerPoint slides, verbal discussion, and written materials to support subject matter. The instructor gives an explanation and review of healthy diet recommendations, which includes a discussion on weight management, recommendations for fruit and vegetable consumption, as well as protein, fluid, caffeine, fiber, sodium, sugar, and alcohol. Tips for eating when patients are short of breath are discussed.   PULMONARY REHAB OTHER RESPIRATORY from 05/14/2018 in [place]  Date  03/12/18  Educator  Rodman Pickle  Instruction Review Code  2- Demonstrated Understanding      Pursed Lip Breathing:  -Group instruction that is supported by demonstration and informational handouts. Instructor discusses the benefits of pursed lip and diaphragmatic breathing and detailed demonstration on how to preform both.     Oxygen Safety:  -Group instruction provided by PowerPoint, verbal discussion, and written material to support subject matter. There is an overview of "What is Oxygen" and "Why do we need it".  Instructor  also reviews how to create a safe environment for oxygen use, the importance of using oxygen as prescribed, and the risks of noncompliance. There is a brief discussion on traveling with oxygen and resources the patient may utilize.   PULMONARY REHAB OTHER RESPIRATORY from 05/14/2018 in [place]  Date  04/16/18  Educator  molly  Instruction Review Code  1- Verbalizes Understanding      Oxygen Equipment:  -Group instruction provided by [employer], written materials, and Insurance underwriter.   PULMONARY REHAB OTHER RESPIRATORY from 05/14/2018 in [place]  Date  04/23/18  Educator  Iona Beard  Instruction Review Code  1- Verbalizes Understanding      Signs and Symptoms:  -Group instruction provided by written material and verbal discussion to support subject matter. Warning signs and symptoms of infection, stroke, and heart attack are reviewed and when to call the physician/911 reinforced. Tips for preventing the spread of infection discussed.   PULMONARY REHAB OTHER RESPIRATORY from 05/14/2018 in [employer]  Date  04/02/18  Educator  RN  Instruction Review Code  2- Demonstrated Understanding      Advanced Directives:  -Group instruction provided by verbal instruction and written material to support subject matter. Instructor reviews Advanced Directive laws and proper instruction for filling out document.   Pulmonary Video:  -Group video education that reviews the importance of medication and oxygen compliance, exercise, good nutrition, pulmonary hygiene, and pursed lip and diaphragmatic breathing for the pulmonary patient.   Exercise for the Pulmonary Patient:  -Group instruction that is supported by a PowerPoint presentation. Instructor discusses benefits of exercise, core components of exercise, frequency, duration, and intensity of an exercise routine,  importance of utilizing pulse oximetry during exercise, safety while exercising, and options of places to exercise outside of rehab.     Pulmonary Medications:  -Verbally interactive group education provided by instructor with focus on inhaled medications and proper administration.   PULMONARY REHAB OTHER RESPIRATORY from 05/14/2018 in [place]  Date  03/24/18  Educator  pharm  Instruction Review Code  2- Demonstrated Understanding      Anatomy and Physiology of the Respiratory System and Intimacy:  -Group instruction provided by PowerPoint, verbal discussion, and written material to support subject matter. Instructor reviews respiratory cycle and anatomical components of the respiratory system and their functions. Instructor also reviews differences in obstructive and restrictive respiratory diseases with examples of each. Intimacy, Sex, and Sexuality differences are reviewed with a discussion on how relationships can change when diagnosed with pulmonary disease. Common sexual concerns are reviewed.   MD DAY -A group question and answer session with a medical doctor that allows participants to ask questions that relate to their pulmonary disease state.   PULMONARY REHAB OTHER RESPIRATORY from 05/14/2018 in [place]  Date  05/07/18  Educator  Dr. Nelda Marseille  Instruction Review Code  1- Verbalizes Understanding      OTHER EDUCATION -Group or individual verbal, written, or video instructions that support the educational goals of the pulmonary rehab program.   PULMONARY REHAB OTHER RESPIRATORY from 05/14/2018 in [place]  Date  04/02/18  Educator  Cloyde Reams  Instruction Review Code  2- Demonstrated Understanding [Sedentary Lifestyle]      Holiday Eating Survival Tips:  -Group instruction provided by PowerPoint slides, verbal discussion, and written materials to support subject matter. The  instructor gives patients tips, tricks, and techniques to help them not only survive but enjoy the holidays despite the onslaught of food that accompanies the holidays.   Knowledge Questionnaire Score:   Core Components/Risk Factors/Patient Goals at Admission: Personal Goals and Risk Factors at Admission - 02/27/18 1040      Core Components/Risk Factors/Patient Goals on Admission    Weight Management  Yes;Weight Loss    Intervention  Weight Management: Develop a combined nutrition and exercise program designed to reach desired caloric intake, while maintaining appropriate intake of nutrient and fiber, sodium and fats, and appropriate energy expenditure required for the weight goal.;Weight Management: Provide education and appropriate resources to help participant work on and attain dietary goals.;Obesity: Provide education and appropriate resources to help participant work on and attain dietary goals.    Admit Weight  160 lb 7.9 oz (72.8 kg)    Goal Weight: Short Term  155 lb (70.3 kg)    Goal Weight: Long Term  150 lb (68 kg)    Expected Outcomes  Understanding of distribution of calorie intake throughout the  day with the consumption of 4-5 meals/snacks;Understanding recommendations for meals to include 15-35% energy as protein, 25-35% energy from fat, 35-60% energy from carbohydrates, less than 246m of dietary cholesterol, 20-35 gm of total fiber daily;Weight Loss: Understanding of general recommendations for a balanced deficit meal plan, which promotes 1-2 lb weight loss per week and includes a negative energy balance of [phone] kcal/d;Long Term: Adherence to nutrition and physical activity/exercise program aimed toward attainment of established weight goal;Short Term: Continue to assess and modify interventions until short term weight is achieved    Improve shortness of breath with ADL's  Yes    Intervention  Provide education, individualized exercise plan and daily activity instruction to  help decrease symptoms of SOB with activities of daily living.    Expected Outcomes  Short Term: Improve cardiorespiratory fitness to achieve a reduction of symptoms when performing ADLs;Long Term: Be able to perform more ADLs without symptoms or delay the onset of symptoms    Diabetes  Yes    Intervention  Provide education about signs/symptoms and action to take for hypo/hyperglycemia.;Provide education about proper nutrition, including hydration, and aerobic/resistive exercise prescription along with prescribed medications to achieve blood glucose in normal ranges: Fasting glucose 65-99 mg/dL    Expected Outcomes  Short Term: Participant verbalizes understanding of the signs/symptoms and immediate care of hyper/hypoglycemia, proper foot care and importance of medication, aerobic/resistive exercise and nutrition plan for blood glucose control.;Long Term: Attainment of HbA1C < 7%.    Hypertension  Yes    Intervention  Provide education on lifestyle modifcations including regular physical activity/exercise, weight management, moderate sodium restriction and increased consumption of fresh fruit, vegetables, and low fat dairy, alcohol moderation, and smoking cessation.;Monitor prescription use compliance.    Expected Outcomes  Short Term: Continued assessment and intervention until BP is < 140/962mHG in hypertensive participants. < 130/8037mG in hypertensive participants with diabetes, heart failure or chronic kidney disease.;Long Term: Maintenance of blood pressure at goal levels.    Lipids  Yes    Intervention  Provide education and support for participant on nutrition & aerobic/resistive exercise along with prescribed medications to achieve LDL <105m61mDL >40mg66m Expected Outcomes  Short Term: Participant states understanding of desired cholesterol values and is compliant with medications prescribed. Participant is following exercise prescription and nutrition guidelines.;Long Term: Cholesterol  controlled with medications as prescribed, with individualized exercise RX and with personalized nutrition plan. Value goals: LDL < 105mg,66m > 40 mg.       Core Components/Risk Factors/Patient Goals Review:  Goals and Risk Factor Review    Row Name 04/01/18 1541 04/29/18 1913 05/27/18 1130         Core Components/Risk Factors/Patient Goals Review   Personal Goals Review  Weight Management/Obesity;Develop more efficient breathing techniques such as purse lipped breathing and diaphragmatic breathing and practicing self-pacing with activity.;Increase knowledge of respiratory medications and ability to use respiratory devices properly.;Improve shortness of breath with ADL's;Diabetes;Lipids  Weight Management/Obesity;Develop more efficient breathing techniques such as purse lipped breathing and diaphragmatic breathing and practicing self-pacing with activity.;Increase knowledge of respiratory medications and ability to use respiratory devices properly.;Improve shortness of breath with ADL's;Diabetes  Weight Management/Obesity;Develop more efficient breathing techniques such as purse lipped breathing and diaphragmatic breathing and practicing self-pacing with activity.;Increase knowledge of respiratory medications and ability to use respiratory devices properly.;Improve shortness of breath with ADL's;Diabetes     Review  Pt has completed 6 exercise sessions.  Pt with wt gain of .7 kg.  Hopefully we will see an improvement as pt is able to be more active on off days from rehab.  Pt completed respiratory medication class and will engage in PLB with verbal cues from staff.   Pt reports CBG well within normal limits to support exercise safely.   Pt rates her shortness of breath 1-2 level for dypnea.  Pt reports her lipid readings are in acceptable range, will resolve this goal.   Pt workloads nustep increased to level 3,  recumbent  level 2 and averages 6-8 laps around the track.  Continue to montior pt progress  during the next 30 day assessment  Pt has completed 12 exercise sessions.  Pt remains a wt gain of .7 kg.  Hopefully we will see an improvement as pt is able to be more active on off days from rehab.  Pt  will engage in PLB mostly independently.   Pt reports CBG well within normal limits to support exercise safely.   Pt rates her shortness of breath 1-2 level for dypnea.    Pt workloads nustep maintain at  level 3,  recumbent  level 2 and averages 6-8 laps around the track.  Continue to montior pt progress during the next 30 day assessment  Pt has completed 19 exercise sessions.  Pt  has a wt gain of 2.7 kg.  Pt believes this is because the food at the assistive living place is very good. Pt  will engage in PLB mostly independently.   Pt reports CBG well within normal limits to support exercise safely.   Pt rates her shortness of breath 1-2 level for dypnea.    Pt workloads nustep maintain at  level 3,  recumbent  level 2 and averages 6-8 laps around the track. Pt lacks the motivation to move workloads up.  Pt can become very curt with staff regarding workloads. Continue to montior pt progress during the next 30 day assessment     Expected Outcomes  See Admission Goals/Outcomes  See Admission Goals/Outcomes  See Admission Goals/Outcomes        Core Components/Risk Factors/Patient Goals at Discharge (Final Review):  Goals and Risk Factor Review - 05/27/18 1130      Core Components/Risk Factors/Patient Goals Review   Personal Goals Review  Weight Management/Obesity;Develop more efficient breathing techniques such as purse lipped breathing and diaphragmatic breathing and practicing self-pacing with activity.;Increase knowledge of respiratory medications and ability to use respiratory devices properly.;Improve shortness of breath with ADL's;Diabetes    Review  Pt has completed 19 exercise sessions.  Pt  has a wt gain of 2.7 kg.  Pt believes this is because the food at the assistive living place is very good.  Pt  will engage in PLB mostly independently.   Pt reports CBG well within normal limits to support exercise safely.   Pt rates her shortness of breath 1-2 level for dypnea.    Pt workloads nustep maintain at  level 3,  recumbent  level 2 and averages 6-8 laps around the track. Pt lacks the motivation to move workloads up.  Pt can become very curt with staff regarding workloads. Continue to montior pt progress during the next 30 day assessment    Expected Outcomes  See Admission Goals/Outcomes       ITP Comments: ITP Comments    Row Name 02/27/18 1006 04/01/18 1539 04/29/18 1913 05/27/18 1130     ITP Comments  Dr. Jennet Maduro, Medical Director  Dr. Jennet Maduro, Medical Director Pulmonary Rehab  Dr. Jennet Maduro, Medical Director Pulmonary Rehab  Dr. Jennet Maduro, Medical Director Pulmonary Rehab       Comments: Pt has completed 19 exercise sessions. Continue to monitor pt progress during next 30 day assessment.Maurice Small RN, BSN Cardiac and Pulmonary Rehab Nurse Navigator

## 2018-05-28 ENCOUNTER — Encounter (HOSPITAL_COMMUNITY)
Admission: RE | Admit: 2018-05-28 | Discharge: 2018-05-28 | Disposition: A | Discharge status: Home / Self Care | Payer: Medicare HMO | Source: Ambulatory Visit | Attending: Pulmonary Disease | Admitting: Pulmonary Disease

## 2018-05-28 DIAGNOSIS — J9611 Chronic respiratory failure with hypoxia: Secondary | ICD-10-CM | POA: Diagnosis not present

## 2018-05-28 NOTE — Progress Notes (Signed)
Daily Session Note  Patient Details  Name: Rebecca [name] MRN: [id number] Date of Birth: 10/31/1940 Referring Provider:     Pulmonary Rehab Walk Test from 03/05/2018 in [place]  Referring Provider  Elsworth Soho      Encounter Date: 05/28/2018  Check In: Session Check In - 05/28/18 1030      Check-In   Supervising physician immediately available to respond to emergencies  [facility] immediately available    Physician(s)  Dr. Maryland Pink    Location  [facility]    Staff Present  Rosebud Poles, RN, BSN;Carlette Wilber Oliphant, RN, Bjorn Loser, MS, Exercise Physiologist    Medication changes reported      No    Fall or balance concerns reported     No    Tobacco Cessation  No Change    Warm-up and Cool-down  Performed as group-led instruction    Resistance Training Performed  Yes    VAD Patient?  No    PAD/SET Patient?  No      Pain Assessment   Currently in Pain?  No/denies    Multiple Pain Sites  No       Capillary Blood Glucose: No results found for this or any previous visit (from the past 24 hour(s)).    Social History   Tobacco Use  Smoking Status Former Smoker  . Types: Cigarettes  . Last attempt to quit: 2003  . Years since quitting: 16.8  Smokeless Tobacco Never Used    Goals Met:  Proper associated with RPD/PD & O2 Sat Exercise tolerated well  Goals Unmet:  Not Applicable  Comments: Service time is from 1030 to 1205    Dr. Rush Farmer is Medical Director for Pulmonary Rehab at [facility].

## 2018-06-02 ENCOUNTER — Encounter (HOSPITAL_COMMUNITY)
Admission: RE | Admit: 2018-06-02 | Discharge: 2018-06-02 | Disposition: A | Discharge status: Home / Self Care | Payer: Medicare HMO | Source: Ambulatory Visit | Attending: Pulmonary Disease | Admitting: Pulmonary Disease

## 2018-06-02 DIAGNOSIS — J9611 Chronic respiratory failure with hypoxia: Secondary | ICD-10-CM

## 2018-06-02 NOTE — Progress Notes (Signed)
Daily Session Note  Patient Details  Name: Rebecca [name] MRN: [id number] Date of Birth: 1940/07/19 Referring Provider:     Pulmonary Rehab Walk Test from 03/05/2018 in [place]  Referring Provider  Elsworth Soho      Encounter Date: 06/02/2018  Check In: Session Check In - 06/02/18 1250      Check-In   Supervising physician immediately available to respond to emergencies  [facility] immediately available    Physician(s)  Dr. Maryland Pink    Location  [facility]    Staff Present  Su Hilt, MS, ACSM RCEP, Exercise Physiologist;Dalton Kris Mouton, MS, Exercise Physiologist;Lisa Ysidro Evert, RN;Olinty Celesta Aver, MS, ACSM CEP, Exercise Physiologist    Medication changes reported      No    Fall or balance concerns reported     No    Tobacco Cessation  No Change    Warm-up and Cool-down  Performed as group-led instruction    Resistance Training Performed  Yes    VAD Patient?  No    PAD/SET Patient?  No      Pain Assessment   Currently in Pain?  No/denies    Pain Score  0-No pain    Multiple Pain Sites  No       Capillary Blood Glucose: No results found for this or any previous visit (from the past 24 hour(s)).    Social History   Tobacco Use  Smoking Status Former Smoker  . Types: Cigarettes  . Last attempt to quit: 2003  . Years since quitting: 16.8  Smokeless Tobacco Never Used    Goals Met:  Proper associated with RPD/PD & O2 Sat Exercise tolerated well  Goals Unmet:  Not Applicable  Comments: Service time is from 1030 to 1210    Dr. Rush Farmer is Medical Director for Pulmonary Rehab at [facility].

## 2018-06-04 ENCOUNTER — Encounter (HOSPITAL_COMMUNITY)
Admission: RE | Admit: 2018-06-04 | Discharge: 2018-06-04 | Disposition: A | Discharge status: Home / Self Care | Payer: Medicare HMO | Source: Ambulatory Visit | Attending: Pulmonary Disease | Admitting: Pulmonary Disease

## 2018-06-04 DIAGNOSIS — J9611 Chronic respiratory failure with hypoxia: Secondary | ICD-10-CM

## 2018-06-04 NOTE — Progress Notes (Signed)
Daily Session Note  Patient Details  Name: LESTER PLATAS MRN: [id number] Date of Birth: 1941-02-13 Referring Provider:     Pulmonary Rehab Walk Test from 03/05/2018 in [place]  Referring Provider  Elsworth Soho      Encounter Date: 06/04/2018  Check In: Session Check In - 06/04/18 1030      Check-In   Supervising physician immediately available to respond to emergencies  [facility] immediately available    Physician(s)  Dr. Cruzita Lederer    Location  [facility]    Staff Present  Rosebud Poles, RN, BSN;Carlette Wilber Oliphant, RN, Bjorn Loser, MS, Exercise Physiologist;Lisa Ysidro Evert, RN;Brittany Durene Fruits, BS, ACSM CEP, Exercise Physiologist    Medication changes reported      No    Fall or balance concerns reported     No    Tobacco Cessation  No Change    Warm-up and Cool-down  Performed as group-led instruction    Resistance Training Performed  Yes    VAD Patient?  No    PAD/SET Patient?  No      Pain Assessment   Currently in Pain?  No/denies    Multiple Pain Sites  No       Capillary Blood Glucose: No results found for this or any previous visit (from the past 24 hour(s)).    Social History   Tobacco Use  Smoking Status Former Smoker  . Types: Cigarettes  . Last attempt to quit: 2003  . Years since quitting: 16.8  Smokeless Tobacco Never Used    Goals Met:  Proper associated with RPD/PD & O2 Sat Exercise tolerated well  Goals Unmet:  Not Applicable  Comments: Service time is from 1030 to 1200    Dr. Rush Farmer is Medical Director for Pulmonary Rehab at [facility].

## 2018-06-06 DIAGNOSIS — N390 Urinary tract infection, site not specified: Secondary | ICD-10-CM | POA: Diagnosis not present

## 2018-06-08 DIAGNOSIS — J9611 Chronic respiratory failure with hypoxia: Secondary | ICD-10-CM | POA: Diagnosis not present

## 2018-06-08 DIAGNOSIS — J432 Centrilobular emphysema: Secondary | ICD-10-CM | POA: Diagnosis not present

## 2018-06-08 DIAGNOSIS — J449 Chronic obstructive pulmonary disease, unspecified: Secondary | ICD-10-CM | POA: Diagnosis not present

## 2018-06-09 ENCOUNTER — Encounter (HOSPITAL_COMMUNITY)
Admission: RE | Admit: 2018-06-09 | Discharge: 2018-06-09 | Disposition: A | Discharge status: Home / Self Care | Payer: Medicare HMO | Source: Ambulatory Visit | Attending: Pulmonary Disease | Admitting: Pulmonary Disease

## 2018-06-09 DIAGNOSIS — J9611 Chronic respiratory failure with hypoxia: Secondary | ICD-10-CM | POA: Diagnosis not present

## 2018-06-09 NOTE — Progress Notes (Signed)
I have reviewed the pulmonary graduate packet with Rebecca [name] . Rebecca [name] is scheduled to graduate on 06/16/18. The patient was given an exercise prescription and stated that they would continue to exercise at home.  Rebecca [name] and I discussed how to progress their exercise prescription.  The patient stated that they understand the exercise prescription.  We reviewed exercise guidelines, target heart rate during exercise, RPE Scale, weather conditions, NTG use, endpoints for exercise, warmup and cool down.  The patient was also given information regarding the pulmonary maintenance program and given homework to complete and return before graduation. Patient is encouraged to come to me with any questions.

## 2018-06-09 NOTE — Progress Notes (Signed)
Daily Session Note  Patient Details  Name: Rebecca [name] MRN: [id number] Date of Birth: 1940/11/29 Referring Provider:     Pulmonary Rehab Walk Test from 03/05/2018 in [place]  Referring Provider  Elsworth Soho      Encounter Date: 06/09/2018  Check In: Session Check In - 06/09/18 1220      Check-In   Supervising physician immediately available to respond to emergencies  [facility] immediately available    Physician(s)  Dr. Starla Link    Location  [facility]    Staff Present  Maurice Small, RN, Bjorn Loser, MS, Exercise Physiologist;Lisa Ysidro Evert, RN    Medication changes reported      No    Fall or balance concerns reported     No    Tobacco Cessation  No Change    Warm-up and Cool-down  Performed as group-led instruction    Resistance Training Performed  Yes    VAD Patient?  No    PAD/SET Patient?  No      Pain Assessment   Currently in Pain?  No/denies    Pain Score  0-No pain    Multiple Pain Sites  No       Capillary Blood Glucose: No results found for this or any previous visit (from the past 24 hour(s)).  Exercise Prescription Changes - 06/09/18 1500      Response to Exercise   Blood Pressure (Admit)  160/70    Blood Pressure (Exercise)  140/70    Blood Pressure (Exit)  142/80    Heart Rate (Admit)  74 bpm    Heart Rate (Exercise)  79 bpm    Heart Rate (Exit)  77 bpm    Oxygen Saturation (Admit)  96 %    Oxygen Saturation (Exercise)  96 %    Oxygen Saturation (Exit)  98 %    Rating of Perceived Exertion (Exercise)  13    Perceived Dyspnea (Exercise)  2    Duration  Progress to 45 minutes of aerobic exercise without signs/symptoms of physical distress    Intensity  THRR unchanged      Progression   Progression  Continue to progress workloads to maintain intensity without signs/symptoms of physical distress.      Resistance Training   Training Prescription  Yes    Weight  orange bands    Reps   10-15    Time  10 Minutes      Oxygen   Oxygen  Continuous    Liters  4      Recumbant Bike   Level  2    Minutes  17      NuStep   Level  3    SPM  80    Minutes  17      Track   Laps  5    Minutes  17       Social History   Tobacco Use  Smoking Status Former Smoker  . Types: Cigarettes  . Last attempt to quit: 2003  . Years since quitting: 16.9  Smokeless Tobacco Never Used    Goals Met:  Proper associated with RPD/PD & O2 Sat Exercise tolerated well  Goals Unmet:  Not Applicable  Comments: Service time is from 1030 to 1210    Dr. Rush Farmer is Medical Director for Pulmonary Rehab at [facility].

## 2018-06-12 ENCOUNTER — Telehealth: Payer: Self-pay | Admitting: Pulmonary Disease

## 2018-06-12 NOTE — Telephone Encounter (Signed)
Spoke with pt, she states she is very SOB when she walks but it is to the point where is drops to low in the 70's but she recovers once she rests. She states she is on 4L with exertion and wants an appt to be evaluated. I made her an appt with TP on Monday at 11:30, 12/*08/2017. Nothing further is needed.

## 2018-06-14 DIAGNOSIS — N390 Urinary tract infection, site not specified: Secondary | ICD-10-CM | POA: Diagnosis not present

## 2018-06-15 ENCOUNTER — Encounter: Payer: Self-pay | Admitting: Adult Health

## 2018-06-15 ENCOUNTER — Ambulatory Visit: Payer: Medicare HMO | Admitting: Adult Health

## 2018-06-15 ENCOUNTER — Ambulatory Visit (INDEPENDENT_AMBULATORY_CARE_PROVIDER_SITE_OTHER)
Admission: RE | Admit: 2018-06-15 | Discharge: 2018-06-15 | Disposition: A | Discharge status: Home / Self Care | Payer: Medicare HMO | Source: Ambulatory Visit | Attending: Adult Health | Admitting: Adult Health

## 2018-06-15 VITALS — HR 80 | Ht 62.0 in | Wt 164.2 lb

## 2018-06-15 DIAGNOSIS — R0902 Hypoxemia: Secondary | ICD-10-CM | POA: Diagnosis not present

## 2018-06-15 DIAGNOSIS — J441 Chronic obstructive pulmonary disease with (acute) exacerbation: Secondary | ICD-10-CM

## 2018-06-15 DIAGNOSIS — J449 Chronic obstructive pulmonary disease, unspecified: Secondary | ICD-10-CM | POA: Diagnosis not present

## 2018-06-15 DIAGNOSIS — R0609 Other forms of dyspnea: Secondary | ICD-10-CM

## 2018-06-15 DIAGNOSIS — J9611 Chronic respiratory failure with hypoxia: Secondary | ICD-10-CM | POA: Diagnosis not present

## 2018-06-15 LAB — CBC WITH DIFFERENTIAL/PLATELET
Basophils Absolute: 0 10*3/uL (ref 0.0–0.1)
Basophils Relative: 0.4 % (ref 0.0–3.0)
Eosinophils Absolute: 0.1 10*3/uL (ref 0.0–0.7)
Eosinophils Relative: 0.7 % (ref 0.0–5.0)
HCT: 38.4 % (ref 36.0–46.0)
Hemoglobin: 12.7 g/dL (ref 12.0–15.0)
Lymphocytes Relative: 22.8 % (ref 12.0–46.0)
Lymphs Abs: 1.7 10*3/uL (ref 0.7–4.0)
MCHC: 33 g/dL (ref 30.0–36.0)
MCV: 94.9 fl (ref 78.0–100.0)
Monocytes Absolute: 0.7 10*3/uL (ref 0.1–1.0)
Monocytes Relative: 9.8 % (ref 3.0–12.0)
NEUTROS ABS: 4.8 10*3/uL (ref 1.4–7.7)
Neutrophils Relative %: 66.3 % (ref 43.0–77.0)
Platelets: 222 10*3/uL (ref 150.0–400.0)
RBC: 4.05 Mil/uL (ref 3.87–5.11)
RDW: 14.5 % (ref 11.5–15.5)
WBC: 7.3 10*3/uL (ref 4.0–10.5)

## 2018-06-15 LAB — BASIC METABOLIC PANEL
BUN: 12 mg/dL (ref 6–23)
CO2: 34 mEq/L — ABNORMAL HIGH (ref 19–32)
Calcium: 9.9 mg/dL (ref 8.4–10.5)
Chloride: 100 mEq/L (ref 96–112)
Creatinine, Ser: 0.7 mg/dL (ref 0.40–1.20)
GFR: 86.11 mL/min (ref >60.00)
Glucose, Bld: 103 mg/dL — ABNORMAL HIGH (ref 70–99)
Potassium: 5 mEq/L (ref 3.5–5.1)
Sodium: 141 mEq/L (ref 135–145)

## 2018-06-15 LAB — BRAIN NATRIURETIC PEPTIDE: Pro B Natriuretic peptide (BNP): 105 pg/mL — ABNORMAL HIGH (ref 0.0–100.0)

## 2018-06-15 NOTE — Patient Instructions (Signed)
Finish Levaquin as planned  Continue on Oxygen 4l/m rest , increase Oxygen 6l/m with activity .  Chest xray today .  Labs today  Follow up in 2 weeks with Dr. Vassie Loll  Or Danzig Macgregor NP and As needed   Please contact office for sooner follow up if symptoms do not improve or worsen or seek emergency care

## 2018-06-15 NOTE — Progress Notes (Signed)
@Patient  ID: Rebecca [name], female    DOB: 13-Aug-1940, 77 y.o.   MRN: [id number]  Chief Complaint  Patient presents with  . Follow-up    COPD     Referring provider: Farris Has, MD  HPI: 77 year old female former smoker followed for COPD and oxygen dependent respiratory failure Medical history significant for nonischemic cardiomyopathy   TEST/EVENTS :  Spirometry performed today showed severe airway obstruction with ratio 43, FEV1 of 0.57-31% and FVC of 53%.  Oxygen saturation was 91% RA at rest , dropped to 82% on room air, and stayed up on 3 L pulse O2 on ambulation  06/15/2018 Acute OV  : COPD , O2 RF  Pt presents for an acute office visit.  Patient complains over the last several weeks that her breathing has not been as good.  She gets winded easier and gives out faster than usual.  She has been checking her oxygen levels noticing that her oxygen level drops despite using her oxygen on 4 L.  She denies any increased cough or congestion.  Says she has had a urinary tract infection recently and is currently on Levaquin for this.  She denies any nausea vomiting diarrhea hemoptysis fever or severe back pain Mains on Anoro daily.  Says she has not missed any doses. She is currently going to pulmonary rehab Walk test in the office on 4 L continuous showed O2 saturation drop around 86%.  O2 saturations on 6 L with O2 saturations maintaining greater than 90%  Allergies  Allergen Reactions  . Amoxicillin Rash  . Lisinopril Rash  . Sulfacetamide Rash  . Tetracyclines & Related Rash    Immunization History  Administered Date(s) Administered  . Influenza, High Dose Seasonal PF 04/14/2018  . Pneumococcal Conjugate-13 02/05/2018    Past Medical History:  Diagnosis Date  . Congestive heart disease (HCC)   . COPD (chronic obstructive pulmonary disease) (HCC)   . Diabetes (HCC)   . Hyperlipemia   . Osteoporosis   . Primary hypertension     Tobacco History: Social  History   Tobacco Use  Smoking Status Former Smoker  . Types: Cigarettes  . Last attempt to quit: 2003  . Years since quitting: 16.9  Smokeless Tobacco Never Used   Counseling given: Not Answered   Outpatient Medications Prior to Visit  Medication Sig Dispense Refill  . albuterol (PROVENTIL HFA;VENTOLIN HFA) 108 (90 Base) MCG/ACT inhaler Inhale 1-2 puffs into the lungs every 6 (six) hours as needed for wheezing or shortness of breath. 1 Inhaler 5  . ANORO ELLIPTA 62.5-25 MCG/INH AEPB INHALE 1 PUFF INTO THE LUNGS DAILY. 180 each 3  . azithromycin (ZITHROMAX) 250 MG tablet Take 2 tablets the first day and then 1 tablet a day until gone 6 tablet 0  . Calcium Carbonate-Vitamin D (CALCIUM 600+D) 600-200 MG-UNIT TABS Take by mouth.    . carvedilol (COREG) 25 MG tablet 2 (two) times daily.     . metFORMIN (GLUCOPHAGE) 500 MG tablet daily with supper.      No facility-administered medications prior to visit.      Review of Systems  Constitutional:   No  weight loss, night sweats,  Fevers, chills, fatigue, or  lassitude.  HEENT:   No headaches,  Difficulty swallowing,  Tooth/dental problems, or  Sore throat,                No sneezing, itching, ear ache, nasal congestion, post nasal drip,   CV:  No chest pain,  Orthopnea, PND, swelling in lower extremities, anasarca, dizziness, palpitations, syncope.   GI  No heartburn, indigestion, abdominal pain, nausea, vomiting, diarrhea, change in bowel habits, loss of appetite, bloody stools.   Resp: No shortness of breath with exertion or at rest.  No excess mucus, no productive cough,  No non-productive cough,  No coughing up of blood.  No change in color of mucus.  No wheezing.  No chest wall deformity  Skin: no rash or lesions.  GU: no dysuria, change in color of urine, no urgency or frequency.  No flank pain, no hematuria   MS:  No joint pain or swelling.  No decreased range of motion.  No back pain.    Physical Exam    GEN: A/Ox3;  pleasant , NAD, elderly , O2    HEENT:  [place]/AT,  EACs-clear, TMs-wnl, NOSE-clear, THROAT-clear, no lesions, no postnasal drip or exudate noted.   NECK:  Supple w/ fair ROM; no JVD; normal carotid impulses w/o bruits; no thyromegaly or nodules palpated; no lymphadenopathy.    RESP  Decreased BS in bases   w/o, wheezes/ rales/ or rhonchi. no accessory muscle use, no dullness to percussion  CARD:  RRR, no m/r/g, no peripheral edema, pulses intact, no cyanosis or clubbing.  GI:   Soft & nt; nml bowel sounds; no organomegaly or masses detected.   Musco: Warm bil, no deformities or joint swelling noted.   Neuro: [name], no focal deficits noted.    Skin: Warm, no lesions or rashes    Lab Results:  CBC No results found for: WBC, RBC, HGB, HCT, PLT, MCV, MCH, MCHC, RDW, LYMPHSABS, MONOABS, EOSABS, BASOSABS  BMET No results found for: NA, K, CL, CO2, GLUCOSE, BUN, CREATININE, CALCIUM, GFRNONAA, GFRAA  BNP No results found for: BNP  ProBNP No results found for: PROBNP  Imaging: No results found.    No flowsheet data found.  No results found for: NITRICOXIDE      Assessment & Plan:   No problem-specific Assessment & Plan notes found for this encounter.     Rubye Oaks, NP 06/15/2018

## 2018-06-16 ENCOUNTER — Telehealth: Payer: Self-pay

## 2018-06-16 ENCOUNTER — Encounter (HOSPITAL_COMMUNITY)
Admission: RE | Admit: 2018-06-16 | Discharge: 2018-06-16 | Disposition: A | Discharge status: Home / Self Care | Payer: Medicare HMO | Source: Ambulatory Visit | Attending: Pulmonary Disease | Admitting: Pulmonary Disease

## 2018-06-16 ENCOUNTER — Other Ambulatory Visit: Payer: Self-pay

## 2018-06-16 DIAGNOSIS — J9611 Chronic respiratory failure with hypoxia: Secondary | ICD-10-CM

## 2018-06-16 DIAGNOSIS — J849 Interstitial pulmonary disease, unspecified: Secondary | ICD-10-CM

## 2018-06-16 NOTE — Assessment & Plan Note (Signed)
Adjust O2 to keep sats >90%.   Plan  Patient Instructions  Finish Levaquin as planned  Continue on Oxygen 4l/m rest , increase Oxygen 6l/m with activity .  Chest xray today .  Labs today  Follow up in 2 weeks with Dr. Vassie Loll  Or Sitlali Koerner NP and As needed   Please contact office for sooner follow up if symptoms do not improve or worsen or seek emergency care     '

## 2018-06-16 NOTE — Assessment & Plan Note (Signed)
Increased symptom burden with COPD ? Etiology  No active sx of cough /congestion .  Will check chest xray  And labs to r/o other etiology  Have her finish current abx -levaquin for UTI as would help if underlying lung infection  Adjust O2 to keep sats >90%.   Plan  Patient Instructions  Finish Levaquin as planned  Continue on Oxygen 4l/m rest , increase Oxygen 6l/m with activity .  Chest xray today .  Labs today  Follow up in 2 weeks with Dr. Vassie Loll  Or [place]rett NP and As needed   Please contact office for sooner follow up if symptoms do not improve or worsen or seek emergency care

## 2018-06-16 NOTE — Telephone Encounter (Signed)
Nothing else needed

## 2018-06-16 NOTE — Progress Notes (Signed)
ct 

## 2018-06-18 ENCOUNTER — Ambulatory Visit (INDEPENDENT_AMBULATORY_CARE_PROVIDER_SITE_OTHER)
Admission: RE | Admit: 2018-06-18 | Discharge: 2018-06-18 | Disposition: A | Discharge status: Home / Self Care | Payer: Medicare HMO | Source: Ambulatory Visit | Attending: Adult Health | Admitting: Adult Health

## 2018-06-18 ENCOUNTER — Other Ambulatory Visit: Payer: Medicare HMO

## 2018-06-18 ENCOUNTER — Encounter: Payer: Self-pay | Admitting: Adult Health

## 2018-06-18 DIAGNOSIS — R911 Solitary pulmonary nodule: Secondary | ICD-10-CM | POA: Insufficient documentation

## 2018-06-18 DIAGNOSIS — J9611 Chronic respiratory failure with hypoxia: Secondary | ICD-10-CM

## 2018-06-18 DIAGNOSIS — R0902 Hypoxemia: Secondary | ICD-10-CM | POA: Diagnosis not present

## 2018-06-18 MED ORDER — IOPAMIDOL (ISOVUE-370) INJECTION 76%
80.0000 mL | Freq: Once | INTRAVENOUS | Status: AC | PRN
  Administered 2018-06-18: 80 mL via INTRAVENOUS

## 2018-06-18 NOTE — Progress Notes (Signed)
Called spoke with patient, advised of CTA results / recs as stated by TP.  Pt verbalized understanding and denied any questions.

## 2018-06-30 DIAGNOSIS — R399 Unspecified symptoms and signs involving the genitourinary system: Secondary | ICD-10-CM | POA: Diagnosis not present

## 2018-07-02 ENCOUNTER — Encounter: Payer: Self-pay | Admitting: Pulmonary Disease

## 2018-07-02 ENCOUNTER — Ambulatory Visit: Payer: Medicare HMO | Admitting: Pulmonary Disease

## 2018-07-02 DIAGNOSIS — J449 Chronic obstructive pulmonary disease, unspecified: Secondary | ICD-10-CM | POA: Diagnosis not present

## 2018-07-02 DIAGNOSIS — R911 Solitary pulmonary nodule: Secondary | ICD-10-CM | POA: Diagnosis not present

## 2018-07-02 DIAGNOSIS — J9611 Chronic respiratory failure with hypoxia: Secondary | ICD-10-CM | POA: Diagnosis not present

## 2018-07-02 DIAGNOSIS — I428 Other cardiomyopathies: Secondary | ICD-10-CM

## 2018-07-02 MED ORDER — ALBUTEROL SULFATE (2.5 MG/3ML) 0.083% IN NEBU: 2.5000 mg | INHALATION_SOLUTION | Freq: Four times a day (QID) | RESPIRATORY_TRACT | 0 refills | Status: DC | PRN

## 2018-07-02 MED ORDER — FUROSEMIDE 20 MG PO TABS: 20.0000 mg | ORAL_TABLET | Freq: Every day | ORAL | 0 refills | Status: DC

## 2018-07-02 NOTE — Assessment & Plan Note (Signed)
You have a tiny nodule in your left upper lung. Repeat CT chest without contrast in June 2020

## 2018-07-02 NOTE — Assessment & Plan Note (Signed)
She has been evaluated at [facility] for this in the past, needs to establish with a cardiologist locally Cardiology referral Lasix 20 mg daily for 5 days for leg swelling

## 2018-07-02 NOTE — Progress Notes (Signed)
   Subjective:    Patient ID: Rebecca [name], female    DOB: 1940/09/17, 77 y.o.   MRN: [id number]  HPI  77 year old ex-smoker for follow-up of COPD and chronic respiratory failure on oxygen since 2010  She smoked about a pack per day until she quit in 2003, more than 40 pack years Nonischemic cardiomyopathy has recovered  Chief Complaint  Patient presents with  . Follow-up    2 week f/u for COPD. States her breathing has been the same since her last visit.    Seen 12/2 as acute OV >> more hypoxic needing upto 6L on exertion CT angio neg  Not clear what made her worse but she seems to be back to baseline now requiring 3 L O2 pulse at rest and 5 L on exertion, 9 3% on walking into the office today. She has mild bipedal edema but denies orthopnea or chest pain.  She has not followed up with a cardiologist in years and is on Coreg and questions whether she still needs it.  We personally reviewed images of CT scan which shows significant emphysema.    Significant tests/ events reviewed Spirometry 01/2018 showed severe airway obstruction with ratio 43, FEV1 of 0.57-31% and FVC of 53%.  CT angio 06/18/18 LUL nodule 19mm  Past Medical History:  Diagnosis Date  . Congestive heart disease (HCC)   . COPD (chronic obstructive pulmonary disease) (HCC)   . Diabetes (HCC)   . Hyperlipemia   . Osteoporosis   . Primary hypertension      Review of Systems neg for any significant sore throat, dysphagia, itching, sneezing, nasal congestion or excess/ purulent secretions, fever, chills, sweats, unintended wt loss, pleuritic or exertional cp, hempoptysis, orthopnea pnd or change in chronic leg swelling. Also denies presyncope, palpitations, heartburn, abdominal pain, nausea, vomiting, diarrhea or change in bowel or urinary habits, dysuria,hematuria, rash, arthralgias, visual complaints, headache, numbness weakness or ataxia.     Objective:   Physical Exam  Gen. Pleasant, well-nourished,  in no distress ENT - no thrush, no pallor/icterus,no post nasal drip Neck: No JVD, no thyromegaly, no carotid bruits Lungs: no use of accessory muscles, no dullness to percussion, decreased  without rales or rhonchi  Cardiovascular: Rhythm regular, heart sounds  normal, no murmurs or gallops, 1+ peripheral edema Musculoskeletal: No deformities, no cyanosis or clubbing        Assessment & Plan:

## 2018-07-02 NOTE — Assessment & Plan Note (Signed)
Back to baseline 3 L O2 pulse at rest and 5 L on exertion

## 2018-07-02 NOTE — Addendum Note (Signed)
Addended by: Maurene Capes on: 07/02/2018 02:54 PM   Modules accepted: Orders

## 2018-07-02 NOTE — Assessment & Plan Note (Signed)
Stay on Anoro. Prescription for albuterol nebs # 10 to use in an emergency

## 2018-07-02 NOTE — Patient Instructions (Signed)
You have a tiny nodule in your left upper lung. Repeat CT chest without contrast in June 2020  Stay on Anoro. Prescription for albuterol nebs # 10 to use in an emergency   Cardiology referral Lasix 20 mg daily for 5 days for leg swelling

## 2018-07-08 DIAGNOSIS — J9611 Chronic respiratory failure with hypoxia: Secondary | ICD-10-CM | POA: Diagnosis not present

## 2018-07-08 DIAGNOSIS — J432 Centrilobular emphysema: Secondary | ICD-10-CM | POA: Diagnosis not present

## 2018-07-08 DIAGNOSIS — J449 Chronic obstructive pulmonary disease, unspecified: Secondary | ICD-10-CM | POA: Diagnosis not present

## 2018-07-12 NOTE — Progress Notes (Signed)
Discharge Progress Report  Patient Details  Name: Rebecca [name] MRN: [id number] Date of Birth: 24-Sep-1940 Referring Provider:     Pulmonary Rehab Walk Test from 03/05/2018 in [place]  Referring Provider  Elsworth Soho       Number of Visits: 24 exercise session and 11 education classes  Reason for Discharge:  Patient reached a stable level of exercise. Patient independent in their exercise.  Smoking History:  Social History   Tobacco Use  Smoking Status Former Smoker  . Types: Cigarettes  . Last attempt to quit: 2003  . Years since quitting: 17.0  Smokeless Tobacco Never Used    Diagnosis:  Chronic respiratory failure with hypoxia [facility])  ADL [facility]SD: Pulmonary Assessment Scores    Row Name 03/05/18 1311 06/16/18 1357 07/12/18 1937     ADL UCSD   ADL Phase  Entry  Exit  Entry   SOB Score total  -  92  70     CAT Score   CAT Score  -  post 25  pre 20     mMRC Score   mMRC Score  1  -  -      Initial Exercise Prescription: Initial Exercise Prescription - 03/05/18 1300      Date of Initial Exercise RX and Referring Provider   Date  03/05/18    Referring Provider  Elsworth Soho      Oxygen   Oxygen  Continuous    Liters  4      Recumbant Bike   Level  2    Watts  10    Minutes  17      NuStep   Level  2    SPM  80    Minutes  17    METs  1.5      Track   Laps  5    Minutes  17      Prescription Details   Frequency (times per week)  2    Duration  Progress to 45 minutes of aerobic exercise without signs/symptoms of physical distress      Intensity   THRR 40-80% of Max Heartrate  57-114    Ratings of Perceived Exertion  11-13    Perceived Dyspnea  0-4      Progression   Progression  Continue progressive overload as per policy without signs/symptoms or physical distress.      Resistance Training   Training Prescription  Yes    Weight  orange bands    Reps  10-15       Discharge Exercise Prescription (Final Exercise  Prescription Changes): Exercise Prescription Changes - 06/09/18 1500      Response to Exercise   Blood Pressure (Admit)  160/70    Blood Pressure (Exercise)  140/70    Blood Pressure (Exit)  142/80    Heart Rate (Admit)  74 bpm    Heart Rate (Exercise)  79 bpm    Heart Rate (Exit)  77 bpm    Oxygen Saturation (Admit)  96 %    Oxygen Saturation (Exercise)  96 %    Oxygen Saturation (Exit)  98 %    Rating of Perceived Exertion (Exercise)  13    Perceived Dyspnea (Exercise)  2    Duration  Progress to 45 minutes of aerobic exercise without signs/symptoms of physical distress    Intensity  THRR unchanged      Progression   Progression  Continue to progress workloads to maintain intensity  without signs/symptoms of physical distress.      Resistance Training   Training Prescription  Yes    Weight  orange bands    Reps  10-15    Time  10 Minutes      Oxygen   Oxygen  Continuous    Liters  4      Recumbant Bike   Level  2    Minutes  17      NuStep   Level  3    SPM  80    Minutes  17      Track   Laps  5    Minutes  17       Functional Capacity: 6 Minute Walk    Row Name 03/05/18 1306 06/16/18 1310       6 Minute Walk   Phase  Initial  Discharge    Distance  1000 feet  965 feet    Distance Feet Change  -  -135 ft    Walk Time  - 5 minutes 30 seconds  5.67 minutes    # of Rest Breaks  1 30 second break standing  1 standing    MPH  1.89  1.83    METS  2.38  2.38    RPE  12  13    Perceived Dyspnea   3  3    Symptoms  No  No    Resting HR  78 bpm  74 bpm    Resting BP  124/66  102/70    Resting Oxygen Saturation   97 %  96 %    Exercise Oxygen Saturation  during 6 min walk  90 %  84 %    Max Ex. HR  97 bpm  103 bpm    Max Ex. BP  124/84  160/82    2 Minute Post BP  -  130/80      Interval HR   1 Minute HR  84  91    2 Minute HR  91  103    3 Minute HR  95  102    4 Minute HR  97  93    5 Minute HR  94  95    6 Minute HR  94  94    2 Minute Post HR   85  77    Interval Heart Rate?  Yes  Yes      Interval Oxygen   Interval Oxygen?  Yes  Yes    Baseline Oxygen Saturation %  97 %  96 %    1 Minute Oxygen Saturation %  97 %  90 %    1 Minute Liters of Oxygen  4 L  4 L    2 Minute Oxygen Saturation %  92 %  84 %    2 Minute Liters of Oxygen  4 L  4 L    3 Minute Oxygen Saturation %  90 %  90 %    3 Minute Liters of Oxygen  4 L  6 L    4 Minute Oxygen Saturation %  93 %  95 %    4 Minute Liters of Oxygen  4 L  6 L    5 Minute Oxygen Saturation %  94 %  94 %    5 Minute Liters of Oxygen  4 L  6 L    6 Minute Oxygen Saturation %  94 %  94 %    6 Minute  Liters of Oxygen  4 L  6 L    2 Minute Post Oxygen Saturation %  100 %  97 %    2 Minute Post Liters of Oxygen  4 L  4 L       Psychological, QOL, Others - Outcomes: PHQ 2/9: Depression screen [facility] 2/9 06/16/2018 02/27/2018  Decreased Interest 0 0  Down, Depressed, Hopeless 0 0  PHQ - 2 Score 0 0  Altered sleeping 1 0  Tired, decreased energy 1 1  Change in appetite 0 0  Feeling bad or failure about yourself  0 0  Trouble concentrating 0 0  Moving slowly or fidgety/restless 0 0  Suicidal thoughts 0 0  PHQ-9 Score 2 1  Difficult doing work/chores Not difficult at all Not difficult at all    Quality of Life:   Personal Goals: Goals established at orientation with interventions provided to work toward goal. Personal Goals and Risk Factors at Admission - 02/27/18 1040      Core Components/Risk Factors/Patient Goals on Admission    Weight Management  Yes;Weight Loss    Intervention  Weight Management: Develop a combined nutrition and exercise program designed to reach desired caloric intake, while maintaining appropriate intake of nutrient and fiber, sodium and fats, and appropriate energy expenditure required for the weight goal.;Weight Management: Provide education and appropriate resources to help participant work on and attain dietary goals.;Obesity: Provide education and  appropriate resources to help participant work on and attain dietary goals.    Admit Weight  160 lb 7.9 oz (72.8 kg)    Goal Weight: Short Term  155 lb (70.3 kg)    Goal Weight: Long Term  150 lb (68 kg)    Expected Outcomes  Understanding of distribution of calorie intake throughout the day with the consumption of 4-5 meals/snacks;Understanding recommendations for meals to include 15-35% energy as protein, 25-35% energy from fat, 35-60% energy from carbohydrates, less than '200mg'$  of dietary cholesterol, 20-35 gm of total fiber daily;Weight Loss: Understanding of general recommendations for a balanced deficit meal plan, which promotes 1-2 lb weight loss per week and includes a negative energy balance of [phone] kcal/d;Long Term: Adherence to nutrition and physical activity/exercise program aimed toward attainment of established weight goal;Short Term: Continue to assess and modify interventions until short term weight is achieved    Improve shortness of breath with ADL's  Yes    Intervention  Provide education, individualized exercise plan and daily activity instruction to help decrease symptoms of SOB with activities of daily living.    Expected Outcomes  Short Term: Improve cardiorespiratory fitness to achieve a reduction of symptoms when performing ADLs;Long Term: Be able to perform more ADLs without symptoms or delay the onset of symptoms    Diabetes  Yes    Intervention  Provide education about signs/symptoms and action to take for hypo/hyperglycemia.;Provide education about proper nutrition, including hydration, and aerobic/resistive exercise prescription along with prescribed medications to achieve blood glucose in normal ranges: Fasting glucose 65-99 mg/dL    Expected Outcomes  Short Term: Participant verbalizes understanding of the signs/symptoms and immediate care of hyper/hypoglycemia, proper foot care and importance of medication, aerobic/resistive exercise and nutrition plan for blood glucose  control.;Long Term: Attainment of HbA1C < 7%.    Hypertension  Yes    Intervention  Provide education on lifestyle modifcations including regular physical activity/exercise, weight management, moderate sodium restriction and increased consumption of fresh fruit, vegetables, and low fat dairy, alcohol moderation, and smoking cessation.;Monitor prescription  use compliance.    Expected Outcomes  Short Term: Continued assessment and intervention until BP is < 140/59m HG in hypertensive participants. < 130/855mHG in hypertensive participants with diabetes, heart failure or chronic kidney disease.;Long Term: Maintenance of blood pressure at goal levels.    Lipids  Yes    Intervention  Provide education and support for participant on nutrition & aerobic/resistive exercise along with prescribed medications to achieve LDL <7030mHDL >34m26m  Expected Outcomes  Short Term: Participant states understanding of desired cholesterol values and is compliant with medications prescribed. Participant is following exercise prescription and nutrition guidelines.;Long Term: Cholesterol controlled with medications as prescribed, with individualized exercise RX and with personalized nutrition plan. Value goals: LDL < 70mg87mL > 40 mg.        Personal Goals Discharge: Goals and Risk Factor Review    Row Name 04/01/18 1541 04/29/18 1913 05/27/18 1130 06/16/18 1152 07/12/18 1930     Core Components/Risk Factors/Patient Goals Review   Personal Goals Review  Weight Management/Obesity;Develop more efficient breathing techniques such as purse lipped breathing and diaphragmatic breathing and practicing self-pacing with activity.;Increase knowledge of respiratory medications and ability to use respiratory devices properly.;Improve shortness of breath with ADL's;Diabetes;Lipids  Weight Management/Obesity;Develop more efficient breathing techniques such as purse lipped breathing and diaphragmatic breathing and practicing  self-pacing with activity.;Increase knowledge of respiratory medications and ability to use respiratory devices properly.;Improve shortness of breath with ADL's;Diabetes  Weight Management/Obesity;Develop more efficient breathing techniques such as purse lipped breathing and diaphragmatic breathing and practicing self-pacing with activity.;Increase knowledge of respiratory medications and ability to use respiratory devices properly.;Improve shortness of breath with ADL's;Diabetes  -  Weight Management/Obesity;Develop more efficient breathing techniques such as purse lipped breathing and diaphragmatic breathing and practicing self-pacing with activity.;Increase knowledge of respiratory medications and ability to use respiratory devices properly.;Improve shortness of breath with ADL's;Diabetes   Review  Pt has completed 6 exercise sessions.  Pt with wt gain of .7 kg.  Hopefully we will see an improvement as pt is able to be more active on off days from rehab.  Pt completed respiratory medication class and will engage in PLB with verbal cues from staff.   Pt reports CBG well within normal limits to support exercise safely.   Pt rates her shortness of breath 1-2 level for dypnea.  Pt reports her lipid readings are in acceptable range, will resolve this goal.   Pt workloads nustep increased to level 3,  recumbent  level 2 and averages 6-8 laps around the track.  Continue to montior pt progress during the next 30 day assessment  Pt has completed 12 exercise sessions.  Pt remains a wt gain of .7 kg.  Hopefully we will see an improvement as pt is able to be more active on off days from rehab.  Pt  will engage in PLB mostly independently.   Pt reports CBG well within normal limits to support exercise safely.   Pt rates her shortness of breath 1-2 level for dypnea.    Pt workloads nustep maintain at  level 3,  recumbent  level 2 and averages 6-8 laps around the track.  Continue to montior pt progress during the next 30 day  assessment  Pt has completed 19 exercise sessions.  Pt  has a wt gain of 2.7 kg.  Pt believes this is because the food at the assistive living place is very good. Pt  will engage in PLB mostly independently.   Pt reports CBG  well within normal limits to support exercise safely.   Pt rates her shortness of breath 1-2 level for dypnea.    Pt workloads nustep maintain at  level 3,  recumbent  level 2 and averages 6-8 laps around the track. Pt lacks the motivation to move workloads up.  Pt can become very curt with staff regarding workloads. Continue to montior pt progress during the next 30 day assessment  Today is Rebecca [name]'s discharge date from pulmonary rehab and she resides at [facility], a independent living facility and she will continue to walking there 5 days per week.  Today is Rebecca [name]'s discharge date from pulmonary rehab.  She completd 24 exercise sessions.   She resides at [facility], a independent living facility and she will continue to walking there 5 days per week.   Expected Outcomes  See Admission Goals/Outcomes  See Admission Goals/Outcomes  See Admission Goals/Outcomes  -  Pt made significant progress toward Admission goals and Outcomes      Exercise Goals and Review: Exercise Goals    Row Name 02/27/18 1029             Exercise Goals   Increase Physical Activity  Yes       Intervention  Provide advice, education, support and counseling about physical activity/exercise needs.;Develop an individualized exercise prescription for aerobic and resistive training based on initial evaluation findings, risk stratification, comorbidities and participant's personal goals.       Expected Outcomes  Short Term: Attend rehab on a regular basis to increase amount of physical activity.;Long Term: Add in home exercise to make exercise part of routine and to increase amount of physical activity.;Long Term: Exercising regularly at least 3-5 days a week.       Increase Strength and Stamina  Yes        Intervention  Provide advice, education, support and counseling about physical activity/exercise needs.;Develop an individualized exercise prescription for aerobic and resistive training based on initial evaluation findings, risk stratification, comorbidities and participant's personal goals.       Expected Outcomes  Short Term: Increase workloads from initial exercise prescription for resistance, speed, and METs.;Short Term: Perform resistance training exercises routinely during rehab and add in resistance training at home;Long Term: Improve cardiorespiratory fitness, muscular endurance and strength as measured by increased METs and functional capacity (6MWT)       Able to understand and use rate of perceived exertion (RPE) scale  Yes       Intervention  Provide education and explanation on how to use RPE scale       Expected Outcomes  Short Term: Able to use RPE daily in rehab to express subjective intensity level;Long Term:  Able to use RPE to guide intensity level when exercising independently       Able to understand and use Dyspnea scale  Yes       Intervention  Provide education and explanation on how to use Dyspnea scale       Expected Outcomes  Short Term: Able to use Dyspnea scale daily in rehab to express subjective sense of shortness of breath during exertion;Long Term: Able to use Dyspnea scale to guide intensity level when exercising independently       Knowledge and understanding of Target Heart Rate Range (THRR)  Yes       Intervention  Provide education and explanation of THRR including how the numbers were predicted and where they are located for reference       Expected  Outcomes  Short Term: Able to state/look up THRR;Short Term: Able to use daily as guideline for intensity in rehab;Long Term: Able to use THRR to govern intensity when exercising independently       Understanding of Exercise Prescription  Yes       Intervention  Provide education, explanation, and written  materials on patient's individual exercise prescription       Expected Outcomes  Short Term: Able to explain program exercise prescription;Long Term: Able to explain home exercise prescription to exercise independently          Exercise Goals Re-Evaluation: Exercise Goals Re-Evaluation    Row Name 03/30/18 1632 04/28/18 1609 05/27/18 0726         Exercise Goal Re-Evaluation   Exercise Goals Review  Increase Physical Activity;Increase Strength and Stamina;Able to understand and use rate of perceived exertion (RPE) scale;Able to understand and use Dyspnea scale;Knowledge and understanding of Target Heart Rate Range (THRR);Understanding of Exercise Prescription  Increase Physical Activity;Increase Strength and Stamina;Able to understand and use rate of perceived exertion (RPE) scale;Able to understand and use Dyspnea scale;Knowledge and understanding of Target Heart Rate Range (THRR);Understanding of Exercise Prescription  Increase Physical Activity;Increase Strength and Stamina;Able to understand and use rate of perceived exertion (RPE) scale;Able to understand and use Dyspnea scale;Knowledge and understanding of Target Heart Rate Range (THRR);Understanding of Exercise Prescription     Comments  Patient has only attended 5 rehab sessions. Patient's progress is going to be slow. However, she started off walking 4 laps (200 ft each) in 15 minutes to now 8 laps. MET average places her in a low level. Will cont. to monitor and motivate.  Patient is progressing slowly. She has a lot of complaints regarding pain. She is able to walk 8 laps (200 ft) in 15 minutes. MET average placees her in a low level. She is not open to workload changes. She is hesitant to exercise at home. Will cont to monitor and motivate as much as possible.  Pt is progressing slowly. Pt has shown that she can walk 8 laps (1 lap=200 ft), but often times only walks 3-5 due to lack of motivation. Pt is resistant to workload increases. Pt  exercises at low MET levels (~2). Will continue to monitor and motivate as much as possible.      Expected Outcomes  Through exercise at rehab and at home, the patient will decrease shortness of breath with daily activities and feel confident in carrying out an exercise regime at home.   Through exercise at rehab and at home, the patient will decrease shortness of breath with daily activities and feel confident in carrying out an exercise regime at home.   Through exercise at rehab and at home, the patient will decrease shortness of breath with daily activities and feel confident in carrying out an exercise regime at home.         Nutrition & Weight - Outcomes:    Nutrition: Nutrition Therapy & Goals - 06/19/18 1017      Nutrition Therapy   Diet  general healthful      Personal Nutrition Goals   Nutrition Goal  Identify food quantities necessary to achieve wt loss of  -2# per week to a goal wt loss of 6-10 lbs at graduation from pulmonary rehab.      Intervention Plan   Intervention  Prescribe, educate and counsel regarding individualized specific dietary modifications aiming towards targeted core components such as weight, hypertension, lipid management, diabetes, heart failure  and other comorbidities.    Expected Outcomes  Short Term Goal: Understand basic principles of dietary content, such as calories, fat, sodium, cholesterol and nutrients.       Nutrition Discharge: Nutrition Assessments - 06/16/18 1522      Rate Your Plate Scores   Pre Score  50    Post Score  39       Education Questionnaire Score: Knowledge Questionnaire Score - 07/12/18 1937      Knowledge Questionnaire Score   Pre Score  14/18       Goals reviewed with patient. Cherre Huger, BSN Cardiac and Training and development officer

## 2018-07-12 NOTE — Addendum Note (Signed)
Encounter addended by: Chelsea Aus, RN on: 07/12/2018 7:43 PM  Actions taken: Episode resolved, Flowsheet data copied forward, Visit Navigator Flowsheet section accepted, Clinical Note Signed

## 2018-07-14 DIAGNOSIS — J449 Chronic obstructive pulmonary disease, unspecified: Secondary | ICD-10-CM | POA: Diagnosis not present

## 2018-07-14 DIAGNOSIS — E119 Type 2 diabetes mellitus without complications: Secondary | ICD-10-CM | POA: Diagnosis not present

## 2018-07-14 DIAGNOSIS — I10 Essential (primary) hypertension: Secondary | ICD-10-CM | POA: Diagnosis not present

## 2018-07-14 DIAGNOSIS — Z23 Encounter for immunization: Secondary | ICD-10-CM | POA: Diagnosis not present

## 2018-07-14 DIAGNOSIS — E785 Hyperlipidemia, unspecified: Secondary | ICD-10-CM | POA: Diagnosis not present

## 2018-07-14 DIAGNOSIS — R350 Frequency of micturition: Secondary | ICD-10-CM | POA: Diagnosis not present

## 2018-07-14 DIAGNOSIS — Z0000 Encounter for general adult medical examination without abnormal findings: Secondary | ICD-10-CM | POA: Diagnosis not present

## 2018-07-14 DIAGNOSIS — Z7984 Long term (current) use of oral hypoglycemic drugs: Secondary | ICD-10-CM | POA: Diagnosis not present

## 2018-07-28 DIAGNOSIS — H04123 Dry eye syndrome of bilateral lacrimal glands: Secondary | ICD-10-CM | POA: Diagnosis not present

## 2018-07-28 DIAGNOSIS — E119 Type 2 diabetes mellitus without complications: Secondary | ICD-10-CM | POA: Diagnosis not present

## 2018-07-28 DIAGNOSIS — Z961 Presence of intraocular lens: Secondary | ICD-10-CM | POA: Diagnosis not present

## 2018-07-28 DIAGNOSIS — H35033 Hypertensive retinopathy, bilateral: Secondary | ICD-10-CM | POA: Diagnosis not present

## 2018-08-08 DIAGNOSIS — J449 Chronic obstructive pulmonary disease, unspecified: Secondary | ICD-10-CM | POA: Diagnosis not present

## 2018-08-08 DIAGNOSIS — J9611 Chronic respiratory failure with hypoxia: Secondary | ICD-10-CM | POA: Diagnosis not present

## 2018-08-08 DIAGNOSIS — J432 Centrilobular emphysema: Secondary | ICD-10-CM | POA: Diagnosis not present

## 2018-08-17 DIAGNOSIS — I10 Essential (primary) hypertension: Secondary | ICD-10-CM | POA: Diagnosis not present

## 2018-08-17 DIAGNOSIS — N3000 Acute cystitis without hematuria: Secondary | ICD-10-CM | POA: Diagnosis not present

## 2018-08-31 DIAGNOSIS — I951 Orthostatic hypotension: Secondary | ICD-10-CM | POA: Diagnosis not present

## 2018-08-31 DIAGNOSIS — H6980 Other specified disorders of Eustachian tube, unspecified ear: Secondary | ICD-10-CM | POA: Diagnosis not present

## 2018-08-31 DIAGNOSIS — H6121 Impacted cerumen, right ear: Secondary | ICD-10-CM | POA: Diagnosis not present

## 2018-09-08 DIAGNOSIS — J449 Chronic obstructive pulmonary disease, unspecified: Secondary | ICD-10-CM | POA: Diagnosis not present

## 2018-09-08 DIAGNOSIS — J9611 Chronic respiratory failure with hypoxia: Secondary | ICD-10-CM | POA: Diagnosis not present

## 2018-09-08 DIAGNOSIS — J432 Centrilobular emphysema: Secondary | ICD-10-CM | POA: Diagnosis not present

## 2018-09-09 DIAGNOSIS — N8111 Cystocele, midline: Secondary | ICD-10-CM | POA: Diagnosis not present

## 2018-09-09 DIAGNOSIS — N390 Urinary tract infection, site not specified: Secondary | ICD-10-CM | POA: Diagnosis not present

## 2018-09-09 DIAGNOSIS — N3946 Mixed incontinence: Secondary | ICD-10-CM | POA: Diagnosis not present

## 2018-09-09 DIAGNOSIS — N819 Female genital prolapse, unspecified: Secondary | ICD-10-CM | POA: Diagnosis not present

## 2018-09-09 DIAGNOSIS — Z9981 Dependence on supplemental oxygen: Secondary | ICD-10-CM | POA: Diagnosis not present

## 2018-09-09 DIAGNOSIS — Z466 Encounter for fitting and adjustment of urinary device: Secondary | ICD-10-CM | POA: Diagnosis not present

## 2018-09-09 DIAGNOSIS — N952 Postmenopausal atrophic vaginitis: Secondary | ICD-10-CM | POA: Diagnosis not present

## 2018-09-09 DIAGNOSIS — J449 Chronic obstructive pulmonary disease, unspecified: Secondary | ICD-10-CM | POA: Diagnosis not present

## 2018-09-16 DIAGNOSIS — N281 Cyst of kidney, acquired: Secondary | ICD-10-CM | POA: Diagnosis not present

## 2018-09-16 DIAGNOSIS — N390 Urinary tract infection, site not specified: Secondary | ICD-10-CM | POA: Diagnosis not present

## 2018-09-16 DIAGNOSIS — N200 Calculus of kidney: Secondary | ICD-10-CM | POA: Diagnosis not present

## 2018-09-16 DIAGNOSIS — J449 Chronic obstructive pulmonary disease, unspecified: Secondary | ICD-10-CM | POA: Diagnosis not present

## 2018-09-16 DIAGNOSIS — N8111 Cystocele, midline: Secondary | ICD-10-CM | POA: Diagnosis not present

## 2018-09-16 DIAGNOSIS — N819 Female genital prolapse, unspecified: Secondary | ICD-10-CM | POA: Diagnosis not present

## 2018-09-16 DIAGNOSIS — R339 Retention of urine, unspecified: Secondary | ICD-10-CM | POA: Diagnosis not present

## 2018-09-20 DIAGNOSIS — Z0100 Encounter for examination of eyes and vision without abnormal findings: Secondary | ICD-10-CM | POA: Diagnosis not present

## 2018-10-01 ENCOUNTER — Ambulatory Visit: Payer: Medicare HMO | Admitting: Adult Health

## 2018-10-06 ENCOUNTER — Encounter: Payer: Self-pay | Admitting: *Deleted

## 2018-10-07 DIAGNOSIS — J432 Centrilobular emphysema: Secondary | ICD-10-CM | POA: Diagnosis not present

## 2018-10-07 DIAGNOSIS — J9611 Chronic respiratory failure with hypoxia: Secondary | ICD-10-CM | POA: Diagnosis not present

## 2018-10-07 DIAGNOSIS — J449 Chronic obstructive pulmonary disease, unspecified: Secondary | ICD-10-CM | POA: Diagnosis not present

## 2018-10-12 ENCOUNTER — Telehealth: Payer: Self-pay | Admitting: Interventional Cardiology

## 2018-10-12 NOTE — Telephone Encounter (Signed)
Spoke with pt and she states she is not having any issues.  Pt states her Cardiologist in [place] retired and she was just trying to get established with a new cardiologist.  Pt states previous Cardiologist, Dr. Georganna Skeans, told her she didn't actually need one but she thought she needed one in order to get Echo.  Advised pt PCP can order echos as well.  Pt asked that I cancel appt and she will contact the office to reschedule if she decides she needs to come back to Cardiology.

## 2018-10-12 NOTE — Telephone Encounter (Signed)
Follow up  ° ° °Patient is returning call.  °

## 2018-10-12 NOTE — Telephone Encounter (Signed)
Called pt in regards to appt with Dr. Katrinka Blazing on 10/15/2018.  Dr. Katrinka Blazing said ok to push out 3 months.  Left message to call back.

## 2018-10-15 ENCOUNTER — Ambulatory Visit: Payer: Medicare HMO | Admitting: Interventional Cardiology

## 2018-11-05 ENCOUNTER — Ambulatory Visit: Payer: Medicare HMO | Admitting: Adult Health

## 2018-11-07 DIAGNOSIS — J449 Chronic obstructive pulmonary disease, unspecified: Secondary | ICD-10-CM | POA: Diagnosis not present

## 2018-11-07 DIAGNOSIS — J432 Centrilobular emphysema: Secondary | ICD-10-CM | POA: Diagnosis not present

## 2018-11-07 DIAGNOSIS — J9611 Chronic respiratory failure with hypoxia: Secondary | ICD-10-CM | POA: Diagnosis not present

## 2018-11-27 DIAGNOSIS — N200 Calculus of kidney: Secondary | ICD-10-CM | POA: Diagnosis not present

## 2018-11-27 DIAGNOSIS — N281 Cyst of kidney, acquired: Secondary | ICD-10-CM | POA: Diagnosis not present

## 2018-12-07 DIAGNOSIS — J9611 Chronic respiratory failure with hypoxia: Secondary | ICD-10-CM | POA: Diagnosis not present

## 2018-12-07 DIAGNOSIS — J449 Chronic obstructive pulmonary disease, unspecified: Secondary | ICD-10-CM | POA: Diagnosis not present

## 2018-12-07 DIAGNOSIS — J432 Centrilobular emphysema: Secondary | ICD-10-CM | POA: Diagnosis not present

## 2018-12-28 DIAGNOSIS — J449 Chronic obstructive pulmonary disease, unspecified: Secondary | ICD-10-CM | POA: Diagnosis not present

## 2018-12-28 DIAGNOSIS — E785 Hyperlipidemia, unspecified: Secondary | ICD-10-CM | POA: Diagnosis not present

## 2018-12-28 DIAGNOSIS — I428 Other cardiomyopathies: Secondary | ICD-10-CM | POA: Diagnosis not present

## 2018-12-28 DIAGNOSIS — J9611 Chronic respiratory failure with hypoxia: Secondary | ICD-10-CM | POA: Diagnosis not present

## 2018-12-28 DIAGNOSIS — I10 Essential (primary) hypertension: Secondary | ICD-10-CM | POA: Diagnosis not present

## 2018-12-28 DIAGNOSIS — Z7984 Long term (current) use of oral hypoglycemic drugs: Secondary | ICD-10-CM | POA: Diagnosis not present

## 2018-12-28 DIAGNOSIS — E119 Type 2 diabetes mellitus without complications: Secondary | ICD-10-CM | POA: Diagnosis not present

## 2018-12-29 ENCOUNTER — Telehealth: Payer: Self-pay | Admitting: Adult Health

## 2018-12-29 NOTE — Telephone Encounter (Signed)
I am fine with OV after her CT chest if she is doing well

## 2018-12-29 NOTE — Telephone Encounter (Signed)
Called and spoke with Patient.  12/31/18 OV cancelled and new OV after CT scheduled 02/25/19 at 0900.  Patient stated she is well at this time, and will call if things change.  Nothing further at this time.

## 2018-12-29 NOTE — Telephone Encounter (Signed)
Spoke with pt, she would like to know if she should come in to see TP on 12/31/2018 because her CT scan is not scheduled until 02/16/2019. She is not currently having any issues that will warrant her to come in but wanted to check with [place] first. TP should I reschedule her an appt after her CT scan or would you still like to see her on 6/18? Please advise.   Patient Instructions by Rigoberto Noel, MD at 07/02/2018 10:45 AM Author: Rigoberto Noel, MD Author Type: Physician Filed: 07/02/2018 11:24 AM  Note Status: Signed Cosign: Cosign Not Required Encounter Date: 07/02/2018  Editor: Rigoberto Noel, MD (Physician)    You have a tiny nodule in your left upper lung. Repeat CT chest without contrast in June 2020  Stay on Anoro. Prescription for albuterol nebs # 10 to use in an emergency   Cardiology referral Lasix 20 mg daily for 5 days for leg swelling

## 2018-12-31 ENCOUNTER — Ambulatory Visit: Payer: Medicare HMO | Admitting: Adult Health

## 2019-01-07 ENCOUNTER — Ambulatory Visit: Payer: Medicare HMO | Admitting: Pulmonary Disease

## 2019-01-07 DIAGNOSIS — J432 Centrilobular emphysema: Secondary | ICD-10-CM | POA: Diagnosis not present

## 2019-01-07 DIAGNOSIS — J9611 Chronic respiratory failure with hypoxia: Secondary | ICD-10-CM | POA: Diagnosis not present

## 2019-01-07 DIAGNOSIS — J449 Chronic obstructive pulmonary disease, unspecified: Secondary | ICD-10-CM | POA: Diagnosis not present

## 2019-01-14 DIAGNOSIS — Z7984 Long term (current) use of oral hypoglycemic drugs: Secondary | ICD-10-CM | POA: Diagnosis not present

## 2019-01-14 DIAGNOSIS — E785 Hyperlipidemia, unspecified: Secondary | ICD-10-CM | POA: Diagnosis not present

## 2019-01-14 DIAGNOSIS — E119 Type 2 diabetes mellitus without complications: Secondary | ICD-10-CM | POA: Diagnosis not present

## 2019-02-06 DIAGNOSIS — J9611 Chronic respiratory failure with hypoxia: Secondary | ICD-10-CM | POA: Diagnosis not present

## 2019-02-06 DIAGNOSIS — J432 Centrilobular emphysema: Secondary | ICD-10-CM | POA: Diagnosis not present

## 2019-02-06 DIAGNOSIS — J449 Chronic obstructive pulmonary disease, unspecified: Secondary | ICD-10-CM | POA: Diagnosis not present

## 2019-02-15 ENCOUNTER — Other Ambulatory Visit: Payer: Medicare HMO

## 2019-02-16 ENCOUNTER — Other Ambulatory Visit: Payer: Self-pay

## 2019-02-16 ENCOUNTER — Ambulatory Visit (INDEPENDENT_AMBULATORY_CARE_PROVIDER_SITE_OTHER)
Admission: RE | Admit: 2019-02-16 | Discharge: 2019-02-16 | Disposition: A | Discharge status: Home / Self Care | Payer: Medicare HMO | Source: Ambulatory Visit | Attending: Pulmonary Disease | Admitting: Pulmonary Disease

## 2019-02-16 DIAGNOSIS — R911 Solitary pulmonary nodule: Secondary | ICD-10-CM | POA: Diagnosis not present

## 2019-02-16 DIAGNOSIS — R918 Other nonspecific abnormal finding of lung field: Secondary | ICD-10-CM | POA: Diagnosis not present

## 2019-02-25 ENCOUNTER — Other Ambulatory Visit: Payer: Self-pay

## 2019-02-25 ENCOUNTER — Encounter: Payer: Self-pay | Admitting: Adult Health

## 2019-02-25 ENCOUNTER — Ambulatory Visit: Payer: Medicare HMO | Admitting: Adult Health

## 2019-02-25 DIAGNOSIS — J9611 Chronic respiratory failure with hypoxia: Secondary | ICD-10-CM | POA: Diagnosis not present

## 2019-02-25 DIAGNOSIS — R911 Solitary pulmonary nodule: Secondary | ICD-10-CM

## 2019-02-25 DIAGNOSIS — J449 Chronic obstructive pulmonary disease, unspecified: Secondary | ICD-10-CM

## 2019-02-25 NOTE — Patient Instructions (Addendum)
Continue on Anoro 1 puff daily, rinse after use CT chest without contrast 1 year to follow-up lung nodules Follow up with PCP for thyroid nodule.  Activity as tolerated Continue on oxygen 4l/m rest and 6l/m activity .  Follow-up with Dr. Elsworth Soho or [place] NP in 6 months and As needed

## 2019-02-25 NOTE — Assessment & Plan Note (Signed)
Continue on o2 , goal to have O2 sats >88-90%

## 2019-02-25 NOTE — Assessment & Plan Note (Signed)
Small lung nodules noted in RUL, LUL , stable  Follow up CT chest in 1 year .

## 2019-02-25 NOTE — Progress Notes (Signed)
 @Patient  ID: Rebecca [name], female    DOB: 1941/01/05, 78 y.o.   MRN: [id number]  Chief Complaint  Patient presents with  . Follow-up    COPD     Referring provider: Farris HasMorrow, Aaron, MD  HPI: 78 year old female former smoker followed for COPD, chronic respiratory failure on oxygen since 2010, lung nodule Medical history significant for previous nonischemic cardiomyopathy Lives in Independent living   TEST/EVENTS :  Spirometry7/2019showed severe airway obstruction with ratio 43, FEV1 of 0.57-31% and FVC of 53%.  CT angio 06/18/18 LUL nodule 6mm  02/25/2019 Follow up : COPD, lung nodule, oxygen dependent respiratory failure Patient presents for a follow-up for COPD, lung nodule, oxygen dependent respiratory failure. Patient has very severe COPD she is maintained on Anoro daily.  She says overall her breathing is doing the same. Gets winded with minimal activity . Does very light activities.  Does activities -cards and bingo at her facility .   Patient is oxygen dependent on 4 l/m rest and 6l/m . Uses portable oxygen tanks with pulsing mode.  Home concentrator uses 4.5l/m at home.   Previous CT chest December 2019 showed a 6 mm left upper lobe nodule.  Patient had a recent follow-up CT chest on February 16, 2019 that showed a stable 1.2 cm left thyroid nodule.  Severe emphysema.  Irregular 6 mm left upper lobe pulmonary nodule-stable and stable right upper lobe 5 mm pulmonary nodule.  We discussed a follow-up CT chest in 1 year.   Allergies  Allergen Reactions  . Amoxicillin Rash  . Lisinopril Rash  . Sulfacetamide Rash  . Tetracyclines & Related Rash    Immunization History  Administered Date(s) Administered  . Influenza, High Dose Seasonal PF 04/14/2018  . Pneumococcal Conjugate-13 02/05/2018  . Tdap 07/27/2018  . Zoster Recombinat (Shingrix) 07/14/2018, 09/14/2018    Past Medical History:  Diagnosis Date  . Congestive heart disease (HCC)   . COPD (chronic  obstructive pulmonary disease) (HCC)   . Diabetes (HCC)   . Hyperlipemia   . Osteoporosis   . Primary hypertension     Tobacco History: Social History   Tobacco Use  Smoking Status Former Smoker  . Packs/day: 1.50  . Years: 40.00  . Pack years: 60.00  . Types: Cigarettes  . Quit date: 2003  . Years since quitting: 17.6  Smokeless Tobacco Never Used   Counseling given: Not Answered   Outpatient Medications Prior to Visit  Medication Sig Dispense Refill  . albuterol (PROVENTIL HFA;VENTOLIN HFA) 108 (90 Base) MCG/ACT inhaler Inhale 1-2 puffs into the lungs every 6 (six) hours as needed for wheezing or shortness of breath. 1 Inhaler 5  . albuterol (PROVENTIL) (2.5 MG/3ML) 0.083% nebulizer solution Take 3 mLs (2.5 mg total) by nebulization every 6 (six) hours as needed for wheezing or shortness of breath. 75 mL 0  . ANORO ELLIPTA 62.5-25 MCG/INH AEPB INHALE 1 PUFF INTO THE LUNGS DAILY. 180 each 3  . azithromycin (ZITHROMAX) 250 MG tablet Take 2 tablets the first day and then 1 tablet a day until gone 6 tablet 0  . Calcium Carbonate-Vitamin D (CALCIUM 600+D) 600-200 MG-UNIT TABS Take by mouth.    . carvedilol (COREG) 25 MG tablet 2 (two) times daily.     Marland Kitchen. estradiol (ESTRACE) 0.1 MG/GM vaginal cream Insert pea-sized amount every other night    . metFORMIN (GLUCOPHAGE) 500 MG tablet daily with supper.     . pravastatin (PRAVACHOL) 10 MG tablet     . furosemide (  LASIX) 20 MG tablet Take 1 tablet (20 mg total) by mouth daily. (Patient not taking: Reported on 02/25/2019) 5 tablet 0  . levofloxacin (LEVAQUIN) 500 MG tablet Take 500 mg by mouth daily.     No facility-administered medications prior to visit.      Review of Systems:   Constitutional:   No  weight loss, night sweats,  Fevers, chills, + fatigue, or  lassitude.  HEENT:   No headaches,  Difficulty swallowing,  Tooth/dental problems, or  Sore throat,                No sneezing, itching, ear ache,  +nasal congestion, post  nasal drip,   CV:  No chest pain,  Orthopnea, PND, swelling in lower extremities, anasarca, dizziness, palpitations, syncope.   GI  No heartburn, indigestion, abdominal pain, nausea, vomiting, diarrhea, change in bowel habits, loss of appetite, bloody stools.   Resp:    No chest wall deformity  Skin: no rash or lesions.  GU: no dysuria, change in color of urine, no urgency or frequency.  No flank pain, no hematuria   MS:  No joint pain or swelling.  No decreased range of motion.  No back pain.    Physical Exam  BP 122/74 (BP Location: Left Arm, Cuff Size: Normal)   Pulse 70   Temp 98.6 F (37 C) (Oral)   Ht 5\' 2"  (1.575 m)   Wt 165 lb 9.6 oz (75.1 kg)   SpO2 95%   BMI 30.29 kg/m   GEN: A/Ox3; pleasant , NAD, elderly , on O2    HEENT:  [place]/AT,  EACs-clear, TMs-wnl, NOSE-clear, THROAT-clear, no lesions, no postnasal drip or exudate noted.   NECK:  Supple w/ fair ROM; no JVD; normal carotid impulses w/o bruits; no thyromegaly or nodules palpated; no lymphadenopathy.    RESP  Diminished BS in bases ,  no accessory muscle use, no dullness to percussion  CARD:  RRR, no m/r/g, no peripheral edema, pulses intact, no cyanosis or clubbing.  GI:   Soft & nt; nml bowel sounds; no organomegaly or masses detected.   Musco: Warm bil, no deformities or joint swelling noted.   Neuro: alert, no focal deficits noted.    Skin: Warm, no lesions or rashes    Lab Results:  CBC  BNP No results found for: BNP  ProBNP    Component Value Date/Time   PROBNP 105.0 (H) 06/15/2018 1255    Imaging: Ct Chest Wo Contrast  Result Date: 02/16/2019 CLINICAL DATA:  Follow-up pulmonary nodule. EXAM: CT CHEST WITHOUT CONTRAST TECHNIQUE: Multidetector CT imaging of the chest was performed following the standard protocol without IV contrast. COMPARISON:  06/18/2018 chest CT angiogram. FINDINGS: Cardiovascular: Normal heart size. No significant pericardial effusion/thickening. Three-vessel  coronary atherosclerosis. Atherosclerotic nonaneurysmal thoracic aorta. Normal caliber pulmonary arteries. Mediastinum/Nodes: Stable hypodense 1.2 cm left thyroid nodule. Unremarkable esophagus. No pathologically enlarged axillary, mediastinal or hilar lymph nodes, noting limited sensitivity for the detection of hilar adenopathy on this noncontrast study. Lungs/Pleura: No pneumothorax. No pleural effusion. Severe centrilobular emphysema with diffuse bronchial wall thickening. Ill-defined irregular 6 mm peripheral left upper lobe pulmonary nodule (series 3/image 63), previously 6 mm, stable. Posterior right upper lobe 5 mm solid pulmonary nodule (series 3/image 72), previously 5 mm (best seen on coronal images), stable. No acute consolidative airspace disease, lung masses or new significant pulmonary nodules. Stable mild parenchymal banding at both lung bases. Upper abdomen: No acute abnormality. Musculoskeletal: No aggressive appearing focal osseous lesions.  Mild thoracic spondylosis. IMPRESSION: 1. Bilateral upper lobe pulmonary nodules are stable since 06/18/2018 chest CT, probably benign. Suggest follow-up chest CT in 12-18 months in this high risk patient. 2. Three-vessel coronary atherosclerosis. Aortic Atherosclerosis (ICD10-I70.0) and Emphysema (ICD10-J43.9). Electronically Signed   By: Delbert Phenix M.D.   On: 02/16/2019 13:11      No flowsheet data found.  No results found for: NITRICOXIDE      Assessment & Plan:   COPD with chronic bronchitis and emphysema (HCC) Currently stable- encouraged on activity as tolerated.  Cont w/ [facility]   Plan  Patient Instructions  Continue on Anoro 1 puff daily, rinse after use CT chest without contrast 1 year to follow-up lung nodules Follow up with PCP for thyroid nodule.  Activity as tolerated Continue on oxygen 4l/m rest and 6l/m activity .  Follow-up with Dr. Vassie Loll or Isabelly Kobler NP in 6 months and As needed     '  Chronic respiratory failure with  hypoxia (HCC) Continue on o2 , goal to have O2 sats >88-90%  Lung nodule Small lung nodules noted in RUL, LUL , stable  Follow up CT chest in 1 year .       Rubye Oaks, NP 02/25/2019

## 2019-02-25 NOTE — Assessment & Plan Note (Signed)
Currently stable- encouraged on activity as tolerated.  Cont w/ [facility]   Plan  Patient Instructions  Continue on Anoro 1 puff daily, rinse after use CT chest without contrast 1 year to follow-up lung nodules Follow up with PCP for thyroid nodule.  Activity as tolerated Continue on oxygen 4l/m rest and 6l/m activity .  Follow-up with Dr. Elsworth Soho or Matthew Pais NP in 6 months and As needed     '

## 2019-03-09 DIAGNOSIS — J449 Chronic obstructive pulmonary disease, unspecified: Secondary | ICD-10-CM | POA: Diagnosis not present

## 2019-03-09 DIAGNOSIS — J432 Centrilobular emphysema: Secondary | ICD-10-CM | POA: Diagnosis not present

## 2019-03-09 DIAGNOSIS — J9611 Chronic respiratory failure with hypoxia: Secondary | ICD-10-CM | POA: Diagnosis not present

## 2019-03-12 DIAGNOSIS — N390 Urinary tract infection, site not specified: Secondary | ICD-10-CM | POA: Diagnosis not present

## 2019-04-09 DIAGNOSIS — J9611 Chronic respiratory failure with hypoxia: Secondary | ICD-10-CM | POA: Diagnosis not present

## 2019-04-09 DIAGNOSIS — J449 Chronic obstructive pulmonary disease, unspecified: Secondary | ICD-10-CM | POA: Diagnosis not present

## 2019-04-09 DIAGNOSIS — J432 Centrilobular emphysema: Secondary | ICD-10-CM | POA: Diagnosis not present

## 2019-04-19 DIAGNOSIS — N200 Calculus of kidney: Secondary | ICD-10-CM | POA: Diagnosis not present

## 2019-04-19 DIAGNOSIS — N281 Cyst of kidney, acquired: Secondary | ICD-10-CM | POA: Diagnosis not present

## 2019-05-09 DIAGNOSIS — J9611 Chronic respiratory failure with hypoxia: Secondary | ICD-10-CM | POA: Diagnosis not present

## 2019-05-09 DIAGNOSIS — J432 Centrilobular emphysema: Secondary | ICD-10-CM | POA: Diagnosis not present

## 2019-05-09 DIAGNOSIS — J449 Chronic obstructive pulmonary disease, unspecified: Secondary | ICD-10-CM | POA: Diagnosis not present

## 2019-05-10 DIAGNOSIS — M79646 Pain in unspecified finger(s): Secondary | ICD-10-CM | POA: Diagnosis not present

## 2019-05-10 DIAGNOSIS — I509 Heart failure, unspecified: Secondary | ICD-10-CM | POA: Diagnosis not present

## 2019-05-10 DIAGNOSIS — E785 Hyperlipidemia, unspecified: Secondary | ICD-10-CM | POA: Diagnosis not present

## 2019-05-18 DIAGNOSIS — Z1159 Encounter for screening for other viral diseases: Secondary | ICD-10-CM | POA: Diagnosis not present

## 2019-06-01 DIAGNOSIS — M79645 Pain in left finger(s): Secondary | ICD-10-CM | POA: Diagnosis not present

## 2019-06-01 DIAGNOSIS — M65312 Trigger thumb, left thumb: Secondary | ICD-10-CM | POA: Diagnosis not present

## 2019-06-03 ENCOUNTER — Other Ambulatory Visit: Payer: Self-pay | Admitting: Pulmonary Disease

## 2019-06-09 DIAGNOSIS — J449 Chronic obstructive pulmonary disease, unspecified: Secondary | ICD-10-CM | POA: Diagnosis not present

## 2019-06-09 DIAGNOSIS — J9611 Chronic respiratory failure with hypoxia: Secondary | ICD-10-CM | POA: Diagnosis not present

## 2019-06-09 DIAGNOSIS — J432 Centrilobular emphysema: Secondary | ICD-10-CM | POA: Diagnosis not present

## 2019-07-09 DIAGNOSIS — J449 Chronic obstructive pulmonary disease, unspecified: Secondary | ICD-10-CM | POA: Diagnosis not present

## 2019-07-09 DIAGNOSIS — J432 Centrilobular emphysema: Secondary | ICD-10-CM | POA: Diagnosis not present

## 2019-07-09 DIAGNOSIS — J9611 Chronic respiratory failure with hypoxia: Secondary | ICD-10-CM | POA: Diagnosis not present

## 2019-07-29 ENCOUNTER — Telehealth: Payer: Self-pay | Admitting: Pulmonary Disease

## 2019-07-29 NOTE — Telephone Encounter (Signed)
Submitted PA for Anoro through CCM   [employer] Key: [id number] - PA Case ID: [id number]  Pt notified we are working on this  Will forward to [place] to f/u on, thanks

## 2019-08-02 DIAGNOSIS — I10 Essential (primary) hypertension: Secondary | ICD-10-CM | POA: Diagnosis not present

## 2019-08-02 DIAGNOSIS — E785 Hyperlipidemia, unspecified: Secondary | ICD-10-CM | POA: Diagnosis not present

## 2019-08-02 DIAGNOSIS — I509 Heart failure, unspecified: Secondary | ICD-10-CM | POA: Diagnosis not present

## 2019-08-02 DIAGNOSIS — J449 Chronic obstructive pulmonary disease, unspecified: Secondary | ICD-10-CM | POA: Diagnosis not present

## 2019-08-02 DIAGNOSIS — E1169 Type 2 diabetes mellitus with other specified complication: Secondary | ICD-10-CM | POA: Diagnosis not present

## 2019-08-02 DIAGNOSIS — J9611 Chronic respiratory failure with hypoxia: Secondary | ICD-10-CM | POA: Diagnosis not present

## 2019-08-02 DIAGNOSIS — Z0000 Encounter for general adult medical examination without abnormal findings: Secondary | ICD-10-CM | POA: Diagnosis not present

## 2019-08-02 DIAGNOSIS — E2839 Other primary ovarian failure: Secondary | ICD-10-CM | POA: Diagnosis not present

## 2019-08-02 NOTE — Telephone Encounter (Signed)
I misread the covermymeds note. The Anoro has actually been denied. I will call pt tomorrow. I will route to myself to follow up on.

## 2019-08-02 NOTE — Telephone Encounter (Addendum)
I called pt and advised her that [facility] approved the Anoro. I called [facility] pharmacy and they stated that when they ran it through, it is saying it is too early to refill and cannot tell me if the PA went through. We will have to wait until it is time for pt to get a refill and that is not until 08/11/2018. Spoke to pt and advised her to call us back if she has any issues when it is time to refill. Nothing further is needed.   Virgilio Belling (Key: [id number])  This request has received an Unfavorable outcome.  An eAppeal may be available. View the bottom of the request to see if an eAppeal is available along with any additional information provided by [facility].

## 2019-08-05 ENCOUNTER — Telehealth: Payer: Self-pay | Admitting: Pulmonary Disease

## 2019-08-05 NOTE — Telephone Encounter (Signed)
Called and spoke to pt. Pt states her insurance is no longer covering Anoro. Advised pt to contact her insurance to see what the covered alternatives are and what their co-pays would be. Pt will be able to see what she can afford. Pt will call us back after speaking with her insurance.

## 2019-08-06 MED ORDER — STIOLTO RESPIMAT 2.5-2.5 MCG/ACT IN AERS: 2.0000 | INHALATION_SPRAY | Freq: Every day | RESPIRATORY_TRACT | 1 refills | Status: DC

## 2019-08-06 NOTE — Telephone Encounter (Signed)
Spoke with pt, advised her that RA switched her to [employer]. She agreed and I sent the RX to [facility] pharmacy. Nothing further is needed.

## 2019-08-06 NOTE — Telephone Encounter (Signed)
Ok to substitute?

## 2019-08-06 NOTE — Telephone Encounter (Signed)
RA can we send in Rx for Stiolto since this is covered under her insurance?

## 2019-08-06 NOTE — Telephone Encounter (Signed)
They have found a different one that is covered stiloto per pt

## 2019-08-09 DIAGNOSIS — J449 Chronic obstructive pulmonary disease, unspecified: Secondary | ICD-10-CM | POA: Diagnosis not present

## 2019-08-09 DIAGNOSIS — J432 Centrilobular emphysema: Secondary | ICD-10-CM | POA: Diagnosis not present

## 2019-08-09 DIAGNOSIS — J9611 Chronic respiratory failure with hypoxia: Secondary | ICD-10-CM | POA: Diagnosis not present

## 2019-08-10 ENCOUNTER — Other Ambulatory Visit: Payer: Self-pay | Admitting: Family Medicine

## 2019-08-10 DIAGNOSIS — E2839 Other primary ovarian failure: Secondary | ICD-10-CM

## 2019-08-11 DIAGNOSIS — Z20828 Contact with and (suspected) exposure to other viral communicable diseases: Secondary | ICD-10-CM | POA: Diagnosis not present

## 2019-08-11 DIAGNOSIS — Z03818 Encounter for observation for suspected exposure to other biological agents ruled out: Secondary | ICD-10-CM | POA: Diagnosis not present

## 2019-08-17 NOTE — Telephone Encounter (Signed)
See telephone note dated 08/05/19, this has already been processed. Nothing further needed at this time.

## 2019-09-02 ENCOUNTER — Ambulatory Visit: Payer: Medicare HMO | Admitting: Adult Health

## 2019-09-06 DIAGNOSIS — Z13220 Encounter for screening for lipoid disorders: Secondary | ICD-10-CM | POA: Diagnosis not present

## 2019-09-06 DIAGNOSIS — Z0000 Encounter for general adult medical examination without abnormal findings: Secondary | ICD-10-CM | POA: Diagnosis not present

## 2019-09-06 DIAGNOSIS — E1169 Type 2 diabetes mellitus with other specified complication: Secondary | ICD-10-CM | POA: Diagnosis not present

## 2019-09-06 DIAGNOSIS — I10 Essential (primary) hypertension: Secondary | ICD-10-CM | POA: Diagnosis not present

## 2019-09-06 DIAGNOSIS — E785 Hyperlipidemia, unspecified: Secondary | ICD-10-CM | POA: Diagnosis not present

## 2019-09-09 ENCOUNTER — Ambulatory Visit: Payer: Medicare HMO | Admitting: Adult Health

## 2019-09-09 ENCOUNTER — Encounter: Payer: Self-pay | Admitting: Adult Health

## 2019-09-09 ENCOUNTER — Other Ambulatory Visit: Payer: Self-pay

## 2019-09-09 DIAGNOSIS — J449 Chronic obstructive pulmonary disease, unspecified: Secondary | ICD-10-CM | POA: Diagnosis not present

## 2019-09-09 DIAGNOSIS — R911 Solitary pulmonary nodule: Secondary | ICD-10-CM | POA: Diagnosis not present

## 2019-09-09 DIAGNOSIS — J9611 Chronic respiratory failure with hypoxia: Secondary | ICD-10-CM | POA: Diagnosis not present

## 2019-09-09 DIAGNOSIS — J432 Centrilobular emphysema: Secondary | ICD-10-CM | POA: Diagnosis not present

## 2019-09-09 NOTE — Progress Notes (Signed)
@Patient  ID: Rebecca [name], female    DOB: 06-22-1941, 79 y.o.   MRN: [id number]  Chief Complaint  Patient presents with  . Follow-up    COPD     Referring provider: London Pepper, MD  HPI: 79 year old female former smoker followed for COPD, chronic respiratory failure on oxygen since 2010 and lung nodules Medical history significant for previous nonischemic cardiomyopathy Lives in independent living at [facility], drives on occasion  TEST/EVENTS :  Spirometry7/2019showed severe airway obstruction with ratio 43, FEV1 of 0.57-31% and FVC of 53%.  CT angio 06/18/18 LUL nodule 10mm  CT chest on February 16, 2019 that showed a stable 1.2 cm left thyroid nodule.  Severe emphysema.  Irregular 6 mm left upper lobe pulmonary nodule-stable and stable right upper lobe 5 mm pulmonary nodule.    09/09/2019 follow-up: COPD, oxygen pendant respiratory failure and lung nodules Patient presents for a 3-month follow-up.  Patient has underlying severe COPD is maintained on oxygen at home.  Patient is recently changed from [place] to [employer].  She says it seems to be working the same.  Her insurance did not cover Anoro.  Patient denies any flare of cough or wheezing.  Says that she gets winded with minimal activity does some light activities. Getting # covid vaccine next week.   Patient has known pulmonary nodules noted on CT scan.  Is being followed serially.  Last CT scan in August 2020 showed stable lung nodules.  We have a planned CT later this year in August. She denies any hemoptysis or weight loss  Allergies  Allergen Reactions  . Amoxicillin Rash  . Lisinopril Rash  . Sulfacetamide Rash  . Tetracyclines & Related Rash    Immunization History  Administered Date(s) Administered  . Influenza Split 03/16/2019  . Influenza, High Dose Seasonal PF 04/14/2018  . Moderna SARS-COVID-2 Vaccination 08/16/2019  . Pneumococcal Conjugate-13 02/05/2018  . Tdap 07/27/2018  . Zoster  Recombinat (Shingrix) 07/14/2018, 09/14/2018    Past Medical History:  Diagnosis Date  . Congestive heart disease ([place])   . COPD (chronic obstructive pulmonary disease) ([place])   . Diabetes ([place])   . Hyperlipemia   . Osteoporosis   . Primary hypertension     Tobacco History: Social History   Tobacco Use  Smoking Status Former Smoker  . Packs/day: 1.50  . Years: 40.00  . Pack years: 60.00  . Types: Cigarettes  . Quit date: 2003  . Years since quitting: 18.1  Smokeless Tobacco Never Used   Counseling given: Not Answered   Outpatient Medications Prior to Visit  Medication Sig Dispense Refill  . albuterol (PROVENTIL HFA;VENTOLIN HFA) 108 (90 Base) MCG/ACT inhaler Inhale 1-2 puffs into the lungs every 6 (six) hours as needed for wheezing or shortness of breath. 1 Inhaler 5  . albuterol (PROVENTIL) (2.5 MG/3ML) 0.083% nebulizer solution Take 3 mLs (2.5 mg total) by nebulization every 6 (six) hours as needed for wheezing or shortness of breath. 75 mL 0  . Calcium Carbonate-Vitamin D (CALCIUM 600+D) 600-200 MG-UNIT TABS Take by mouth.    . carvedilol (COREG) 25 MG tablet 2 (two) times daily.     Marland Kitchen estradiol (ESTRACE) 0.1 MG/GM vaginal cream Insert pea-sized amount every other night    . metFORMIN (GLUCOPHAGE) 500 MG tablet daily with supper.     . pravastatin (PRAVACHOL) 10 MG tablet     . Tiotropium Bromide-Olodaterol (STIOLTO RESPIMAT) 2.5-2.5 MCG/ACT AERS Inhale 2 puffs into the lungs daily. 12 g 1  .  furosemide (LASIX) 20 MG tablet Take 1 tablet (20 mg total) by mouth daily. (Patient not taking: Reported on 09/09/2019) 5 tablet 0  . azithromycin (ZITHROMAX) 250 MG tablet Take 2 tablets the first day and then 1 tablet a day until gone (Patient not taking: Reported on 09/09/2019) 6 tablet 0   No facility-administered medications prior to visit.     Review of Systems:   Constitutional:   No  weight loss, night sweats,  Fevers, chills,  +fatigue, or  lassitude.  HEENT:   No  headaches,  Difficulty swallowing,  Tooth/dental problems, or  Sore throat,                No sneezing, itching, ear ache, nasal congestion, post nasal drip,   CV:  No chest pain,  Orthopnea, PND, swelling in lower extremities, anasarca, dizziness, palpitations, syncope.   GI  No heartburn, indigestion, abdominal pain, nausea, vomiting, diarrhea, change in bowel habits, loss of appetite, bloody stools.   Resp:    No chest wall deformity  Skin: no rash or lesions.  GU: no dysuria, change in color of urine, no urgency or frequency.  No flank pain, no hematuria   MS:  No joint pain or swelling.  No decreased range of motion.  No back pain.    Physical Exam  There were no vitals taken for this visit.  GEN: A/Ox3; pleasant , NAD, elderly, on oxygen   HEENT:  [place]/AT,   NOSE-clear, THROAT-clear, no lesions, no postnasal drip or exudate noted.   NECK:  Supple w/ fair ROM; no JVD; normal carotid impulses w/o bruits; no thyromegaly or nodules palpated; no lymphadenopathy.    RESP  Clear  P & A; w/o, wheezes/ rales/ or rhonchi. no accessory muscle use, no dullness to percussion  CARD:  RRR, no m/r/g, no peripheral edema, pulses intact, no cyanosis or clubbing.  GI:   Soft & nt; nml bowel sounds; no organomegaly or masses detected.   Musco: Warm bil, no deformities or joint swelling noted.   Neuro: alert, no focal deficits noted.    Skin: Warm, no lesions or rashes    Lab Results: BMET  BNP No results found for: BNP   Imaging: No results found.    No flowsheet data found.  No results found for: NITRICOXIDE      Assessment & Plan:   COPD with chronic bronchitis and emphysema (HCC) Currently stable on present regimen  Plan  Patient Instructions  Continue on Stiolto 2 puffs daily .  CT chest without contrast in August 2021.  Activity as tolerated Continue on oxygen 4l/m rest and 6l/m activity .  Ask Primary MD if you have received the Pneumovax vaccine.   Follow-up with Dr. Vassie Loll or Jamisen Duerson NP in 6 months and As needed       Chronic respiratory failure with hypoxia (HCC) Stable on oxygen  pLAN  Patient Instructions  Continue on Stiolto 2 puffs daily .  CT chest without contrast in August 2021.  Activity as tolerated Continue on oxygen 4l/m rest and 6l/m activity .  Ask Primary MD if you have received the Pneumovax vaccine.  Follow-up with Dr. Vassie Loll or Emmakate Hypes NP in 6 months and As needed       Lung nodule Stable pulmonary nodules on CT chest August 2020.  Plan for CT chest follow-up August 2021     Rubye Oaks, NP 09/09/2019

## 2019-09-09 NOTE — Assessment & Plan Note (Signed)
Stable pulmonary nodules on CT chest August 2020.  Plan for CT chest follow-up August 2021

## 2019-09-09 NOTE — Addendum Note (Signed)
Addended by: Boone Master E on: 09/09/2019 10:57 AM   Modules accepted: Orders

## 2019-09-09 NOTE — Assessment & Plan Note (Signed)
Stable on oxygen  pLAN  Patient Instructions  Continue on Stiolto 2 puffs daily .  CT chest without contrast in August 2021.  Activity as tolerated Continue on oxygen 4l/m rest and 6l/m activity .  Ask Primary MD if you have received the Pneumovax vaccine.  Follow-up with Dr. Vassie Loll or Belanna Manring NP in 6 months and As needed

## 2019-09-09 NOTE — Patient Instructions (Addendum)
Continue on Stiolto 2 puffs daily .  CT chest without contrast in August 2021.  Activity as tolerated Continue on oxygen 4l/m rest and 6l/m activity .  Ask Primary MD if you have received the Pneumovax vaccine.  Follow-up with Dr. Vassie Loll or [place] NP in 6 months and As needed

## 2019-09-09 NOTE — Assessment & Plan Note (Signed)
Currently stable on present regimen  Plan  Patient Instructions  Continue on Stiolto 2 puffs daily .  CT chest without contrast in August 2021.  Activity as tolerated Continue on oxygen 4l/m rest and 6l/m activity .  Ask Primary MD if you have received the Pneumovax vaccine.  Follow-up with Dr. Vassie Loll or Amaru Burroughs NP in 6 months and As needed

## 2019-09-28 ENCOUNTER — Encounter (HOSPITAL_COMMUNITY): Payer: Self-pay | Admitting: Radiology

## 2019-09-28 ENCOUNTER — Emergency Department (HOSPITAL_COMMUNITY): Payer: Medicare HMO

## 2019-09-28 ENCOUNTER — Other Ambulatory Visit: Payer: Self-pay

## 2019-09-28 ENCOUNTER — Emergency Department (HOSPITAL_COMMUNITY)
Admission: EM | Admit: 2019-09-28 | Discharge: 2019-09-28 | Disposition: A | Discharge status: Home / Self Care | Payer: Medicare HMO | Attending: Emergency Medicine | Admitting: Emergency Medicine

## 2019-09-28 DIAGNOSIS — Z20822 Contact with and (suspected) exposure to covid-19: Secondary | ICD-10-CM | POA: Diagnosis not present

## 2019-09-28 DIAGNOSIS — J441 Chronic obstructive pulmonary disease with (acute) exacerbation: Secondary | ICD-10-CM

## 2019-09-28 DIAGNOSIS — I10 Essential (primary) hypertension: Secondary | ICD-10-CM | POA: Diagnosis not present

## 2019-09-28 DIAGNOSIS — Z87891 Personal history of nicotine dependence: Secondary | ICD-10-CM | POA: Insufficient documentation

## 2019-09-28 DIAGNOSIS — Z79899 Other long term (current) drug therapy: Secondary | ICD-10-CM | POA: Insufficient documentation

## 2019-09-28 DIAGNOSIS — R05 Cough: Secondary | ICD-10-CM | POA: Diagnosis not present

## 2019-09-28 DIAGNOSIS — E119 Type 2 diabetes mellitus without complications: Secondary | ICD-10-CM | POA: Diagnosis not present

## 2019-09-28 DIAGNOSIS — I509 Heart failure, unspecified: Secondary | ICD-10-CM | POA: Insufficient documentation

## 2019-09-28 DIAGNOSIS — R195 Other fecal abnormalities: Secondary | ICD-10-CM | POA: Diagnosis not present

## 2019-09-28 DIAGNOSIS — Z7984 Long term (current) use of oral hypoglycemic drugs: Secondary | ICD-10-CM | POA: Insufficient documentation

## 2019-09-28 DIAGNOSIS — R0902 Hypoxemia: Secondary | ICD-10-CM | POA: Diagnosis not present

## 2019-09-28 DIAGNOSIS — R0602 Shortness of breath: Secondary | ICD-10-CM | POA: Diagnosis not present

## 2019-09-28 LAB — POC SARS CORONAVIRUS 2 AG -  ED: SARS Coronavirus 2 Ag: NEGATIVE

## 2019-09-28 LAB — BASIC METABOLIC PANEL
Anion gap: 11 (ref 5–15)
BUN: 15 mg/dL (ref 8–23)
CO2: 39 mmol/L — ABNORMAL HIGH (ref 22–32)
Calcium: 9.3 mg/dL (ref 8.9–10.3)
Chloride: 87 mmol/L — ABNORMAL LOW (ref 98–111)
Creatinine, Ser: 0.71 mg/dL (ref 0.44–1.00)
GFR calc Af Amer: >60 mL/min (ref >60)
GFR calc non Af Amer: >60 mL/min (ref >60)
Glucose, Bld: 132 mg/dL — ABNORMAL HIGH (ref 70–99)
Potassium: 4.6 mmol/L (ref 3.5–5.1)
Sodium: 137 mmol/L (ref 135–145)

## 2019-09-28 LAB — CBC WITH DIFFERENTIAL/PLATELET
Abs Immature Granulocytes: 0.03 10*3/uL (ref 0.00–0.07)
Basophils Absolute: 0 10*3/uL (ref 0.0–0.1)
Basophils Relative: 0 %
Eosinophils Absolute: 0 10*3/uL (ref 0.0–0.5)
Eosinophils Relative: 0 %
HCT: 37.8 % (ref 36.0–46.0)
Hemoglobin: 11.3 g/dL — ABNORMAL LOW (ref 12.0–15.0)
Immature Granulocytes: 0 %
Lymphocytes Relative: 17 %
Lymphs Abs: 1.5 10*3/uL (ref 0.7–4.0)
MCH: 30.9 pg (ref 26.0–34.0)
MCHC: 29.9 g/dL — ABNORMAL LOW (ref 30.0–36.0)
MCV: 103.3 fL — ABNORMAL HIGH (ref 80.0–100.0)
Monocytes Absolute: 0.8 10*3/uL (ref 0.1–1.0)
Monocytes Relative: 9 %
Neutro Abs: 6.5 10*3/uL (ref 1.7–7.7)
Neutrophils Relative %: 74 %
Platelets: 318 10*3/uL (ref 150–400)
RBC: 3.66 MIL/uL — ABNORMAL LOW (ref 3.87–5.11)
RDW: 12.4 % (ref 11.5–15.5)
WBC: 8.8 10*3/uL (ref 4.0–10.5)
nRBC: 0 % (ref 0.0–0.2)

## 2019-09-28 LAB — TROPONIN I (HIGH SENSITIVITY): Troponin I (High Sensitivity): 9 ng/L (ref <18)

## 2019-09-28 LAB — D-DIMER, QUANTITATIVE: D-Dimer, Quant: 4.46 ug/mL-FEU — ABNORMAL HIGH (ref 0.00–0.50)

## 2019-09-28 LAB — BRAIN NATRIURETIC PEPTIDE: B Natriuretic Peptide: 124.5 pg/mL — ABNORMAL HIGH (ref 0.0–100.0)

## 2019-09-28 MED ORDER — PREDNISONE 20 MG PO TABS
60.0000 mg | ORAL_TABLET | Freq: Once | ORAL | Status: AC
  Administered 2019-09-28: 60 mg via ORAL
  Filled 2019-09-28: qty 3

## 2019-09-28 MED ORDER — SODIUM CHLORIDE (PF) 0.9 % IJ SOLN
INTRAMUSCULAR | Status: AC
  Filled 2019-09-28: qty 50

## 2019-09-28 MED ORDER — ALBUTEROL SULFATE HFA 108 (90 BASE) MCG/ACT IN AERS
8.0000 | INHALATION_SPRAY | Freq: Once | RESPIRATORY_TRACT | Status: AC
  Administered 2019-09-28: 8 via RESPIRATORY_TRACT
  Filled 2019-09-28: qty 6.7

## 2019-09-28 MED ORDER — IOHEXOL 350 MG/ML SOLN
100.0000 mL | Freq: Once | INTRAVENOUS | Status: AC | PRN
  Administered 2019-09-28: 100 mL via INTRAVENOUS

## 2019-09-28 MED ORDER — PREDNISONE 20 MG PO TABS: ORAL_TABLET | ORAL | 0 refills | Status: DC

## 2019-09-28 NOTE — ED Notes (Signed)
Pure wick has been placed. Suction set to 45mmHg.  

## 2019-09-28 NOTE — Discharge Instructions (Signed)
Start your steroids tomorrow.   Use your albuterol every 4 hours for the next 48 hours and then as needed.  If you develop fever, coughing up blood, new or worsening shortness of breath, or any other new/concerning symptoms or return to the ER for evaluation.

## 2019-09-28 NOTE — ED Notes (Signed)
Patient transported to CT 

## 2019-09-28 NOTE — ED Triage Notes (Signed)
Per EMS, patient has COPD and has had increased shobr x 2 weeks. Ems states patient only has the shob with activity. Patient baseline is 4L o2 [place], patient presents to ED on 6L .

## 2019-09-28 NOTE — ED Notes (Signed)
Patient ambulated around room., o2 saturation at 91-92% on 4L [place] while ambulating. Patient became short of breath and had to sit down.

## 2019-09-28 NOTE — ED Provider Notes (Signed)
[facility]-EMERGENCY DEPT Provider Note   CSN: [id number] Arrival date & time: 09/28/19  1022     History Chief Complaint  Patient presents with  . Shortness of Breath    Rebecca [name] is a 79 y.o. female.  HPI 79 year old female presents with shortness of breath. Patient has been having gradual worsening for about 1 week. Has had a cough with occasional sputum. No chest pain or fever. Has had both covid vaccine doses. No leg swelling. Has noticed dyspnea and low O2 sats when walking even short distances. Feels like she can't get a full breath. Has not tried anything such as albuterol.  She is normally on 4 L but at times is bumped herself up to 6 L.   Past Medical History:  Diagnosis Date  . Congestive heart disease (HCC)   . COPD (chronic obstructive pulmonary disease) (HCC)   . Diabetes (HCC)   . Hyperlipemia   . Osteoporosis   . Primary hypertension     Patient Active Problem List   Diagnosis Date Noted  . Nonischemic cardiomyopathy (HCC) 07/02/2018  . Lung nodule 06/18/2018  . Chronic respiratory failure with hypoxia (HCC) 02/05/2018  . COPD with chronic bronchitis and emphysema (HCC) 02/05/2018    No past surgical history on file.   OB History   No obstetric history on file.     No family history on file.  Social History   Tobacco Use  . Smoking status: Former Smoker    Packs/day: 1.50    Years: 40.00    Pack years: 60.00    Types: Cigarettes    Quit date: 2003    Years since quitting: 18.2  . Smokeless tobacco: Never Used  Substance Use Topics  . Alcohol use: Yes    Alcohol/week: 1.0 standard drinks    Types: 1 Glasses of wine per week  . Drug use: Never    Home Medications Prior to Admission medications   Medication Sig Start Date End Date Taking? Authorizing Provider  albuterol (PROVENTIL HFA;VENTOLIN HFA) 108 (90 Base) MCG/ACT inhaler Inhale 1-2 puffs into the lungs every 6 (six) hours as needed for wheezing or  shortness of breath. 05/12/18  Yes Oretha Milch, MD  albuterol (PROVENTIL) (2.5 MG/3ML) 0.083% nebulizer solution Take 3 mLs (2.5 mg total) by nebulization every 6 (six) hours as needed for wheezing or shortness of breath. 07/02/18  Yes Oretha Milch, MD  Calcium Carbonate-Vitamin D (CALCIUM 600+D) 600-200 MG-UNIT TABS Take 1 tablet by mouth daily.    Yes [provider]  carvedilol (COREG) 25 MG tablet Take 25 mg by mouth 2 (two) times daily.  01/21/18  Yes [provider]  estradiol (ESTRACE) 0.1 MG/GM vaginal cream Place 1 Applicatorful vaginally every other day.  09/09/18  Yes [provider]  furosemide (LASIX) 20 MG tablet Take 1 tablet (20 mg total) by mouth daily. Patient taking differently: Take 20 mg by mouth daily as needed for fluid or edema.  07/02/18  Yes Oretha Milch, MD  metFORMIN (GLUCOPHAGE) 500 MG tablet Take 500 mg by mouth daily with supper.  01/21/18  Yes [provider]  pravastatin (PRAVACHOL) 10 MG tablet Take 10 mg by mouth daily.  07/16/18  Yes [provider]  Tiotropium Bromide-Olodaterol (STIOLTO RESPIMAT) 2.5-2.5 MCG/ACT AERS Inhale 2 puffs into the lungs daily. 08/06/19  Yes Oretha Milch, MD  predniSONE (DELTASONE) 20 MG tablet 2 tabs po daily x 4 days 09/29/19  Pricilla Loveless, MD    Allergies    Amoxicillin, Lisinopril, Sulfacetamide, and Tetracyclines & related  Review of Systems   Review of Systems  Constitutional: Negative for fever.  Respiratory: Positive for cough and shortness of breath.   Cardiovascular: Negative for chest pain and leg swelling.  All other systems reviewed and are negative.   Physical Exam Updated Vital Signs BP (!) 172/81   Pulse 89   Temp 98.4 F (36.9 C) (Oral)   Resp (!) 28   Ht 5\' 2"  (1.575 m)   Wt 74.4 kg   SpO2 98%   BMI 30.00 kg/m   Physical Exam Vitals and nursing note reviewed.  Constitutional:      General: She is not in acute distress.    Appearance: She is  well-developed. She is not ill-appearing or diaphoretic.  HENT:     Head: Normocephalic and atraumatic.     Right Ear: External ear normal.     Left Ear: External ear normal.     Nose: Nose normal.  Eyes:     General:        Right eye: No discharge.        Left eye: No discharge.  Cardiovascular:     Rate and Rhythm: Normal rate and regular rhythm.     Heart sounds: Normal heart sounds.  Pulmonary:     Effort: Pulmonary effort is normal. No tachypnea or accessory muscle usage.     Breath sounds: Decreased breath sounds and wheezing present.  Abdominal:     Palpations: Abdomen is soft.     Tenderness: There is no abdominal tenderness.  Musculoskeletal:     Right lower leg: No edema.     Left lower leg: No edema.  Skin:    General: Skin is warm and dry.  Neurological:     Mental Status: She is alert.  Psychiatric:        Mood and Affect: Mood is not anxious.     ED Results / Procedures / Treatments   Labs (all labs ordered are listed, but only abnormal results are displayed) Labs Reviewed  BASIC METABOLIC PANEL - Abnormal; Notable for the following components:      Result Value   Chloride 87 (*)    CO2 39 (*)    Glucose, Bld 132 (*)    All other components within normal limits  BRAIN NATRIURETIC PEPTIDE - Abnormal; Notable for the following components:   B Natriuretic Peptide 124.5 (*)    All other components within normal limits  CBC WITH DIFFERENTIAL/PLATELET - Abnormal; Notable for the following components:   RBC 3.66 (*)    Hemoglobin 11.3 (*)    MCV 103.3 (*)    MCHC 29.9 (*)    All other components within normal limits  D-DIMER, QUANTITATIVE (NOT AT [facility]) - Abnormal; Notable for the following components:   D-Dimer, Quant 4.46 (*)    All other components within normal limits  POC SARS CORONAVIRUS 2 AG -  ED  TROPONIN I (HIGH SENSITIVITY)    EKG EKG Interpretation  Date/Time:  Tuesday September 28 2019 13:36:19 EDT Ventricular Rate:  89 PR Interval:    QRS  Duration: 139 QT Interval:  450 QTC Calculation: 548 R Axis:   60 Text Interpretation: Sinus rhythm Left bundle branch block No old tracing to compare Confirmed by 09-03-1997 [phone]) on 09/28/2019 1:46:26 PM   Radiology CT Angio Chest PE W and/or Wo Contrast  Result Date: 09/28/2019 CLINICAL DATA:  Worsening shortness of breath x2 weeks. EXAM: CT ANGIOGRAPHY CHEST WITH CONTRAST TECHNIQUE: Multidetector CT imaging of the chest was performed using the standard protocol during bolus administration of intravenous contrast. Multiplanar CT image reconstructions and MIPs were obtained to evaluate the vascular anatomy. CONTRAST:  136mL OMNIPAQUE IOHEXOL 350 MG/ML SOLN COMPARISON:  February 16, 2019 FINDINGS: Cardiovascular: There is marked severity calcification of the thoracic aorta. Satisfactory opacification of the pulmonary arteries to the segmental level. No evidence of pulmonary embolism. Normal heart size. No pericardial effusion. Mediastinum/Nodes: No enlarged mediastinal, hilar, or axillary lymph nodes. Thyroid gland, trachea, and esophagus demonstrate no significant findings. Lungs/Pleura: There is marked severity emphysematous lung disease. Mild linear scarring and/or atelectasis is seen within the posterior aspect of the right lung base. There is no evidence of a pleural effusion or pneumothorax. Upper Abdomen: No acute abnormality. Musculoskeletal: Degenerative changes seen throughout the thoracic spine. Review of the MIP images confirms the above findings. IMPRESSION: 1. No CT evidence of pulmonary embolism. 2. Marked severity emphysematous lung disease. Aortic Atherosclerosis (ICD10-I70.0) and Emphysema (ICD10-J43.9). Electronically Signed   By: Virgina Norfolk M.D.   On: 09/28/2019 16:37   DG Chest Portable 1 View  Result Date: 09/28/2019 CLINICAL DATA:  Cough and shortness of breath EXAM: PORTABLE CHEST 1 VIEW COMPARISON:  Chest CT February 16, 2019; chest radiograph June 15, 2018.  FINDINGS: Emphysematous change is again noted, stable. Mild interstitial prominence in the bases is felt to be secondary to redistribution of blood flow to viable lung segments. There is no evident edema or airspace opacity. The heart size is within normal limits. There is diminished vascularity in the upper lobes, a stable finding, likely due to the underlying emphysematous changes. No adenopathy. There is aortic atherosclerosis. No focal lesions. IMPRESSION: Underlying emphysematous change, grossly stable. No edema or airspace opacity. Stable cardiac silhouette. No adenopathy. Aortic atherosclerosis noted. Aortic Atherosclerosis (ICD10-I70.0) and Emphysema (ICD10-J43.9). Electronically Signed   By: Lowella Grip III M.D.   On: 09/28/2019 11:05    Procedures Procedures (including critical care time)  Medications Ordered in ED Medications  sodium chloride (PF) 0.9 % injection (  Not Given 09/28/19 1419)  sodium chloride (PF) 0.9 % injection (has no administration in time range)  predniSONE (DELTASONE) tablet 60 mg (60 mg Oral Given 09/28/19 1109)  albuterol (VENTOLIN HFA) 108 (90 Base) MCG/ACT inhaler 8 puff (8 puffs Inhalation Given 09/28/19 1109)  iohexol (OMNIPAQUE) 350 MG/ML injection 100 mL (100 mLs Intravenous Contrast Given 09/28/19 1425)    ED Course  I have reviewed the triage vital signs and the nursing notes.  Pertinent labs & imaging results that were available during my care of the patient were reviewed by me and considered in my medical decision making (see chart for details).    MDM Rules/Calculators/A&P                      Patient likely is having a COPD exacerbation.  She was able to ambulate with sats above 90% on her home O2.  I think this is probably COPD given negative CTA and other work-up.  BNP is minimally elevated but with no peripheral edema or pulmonary edema seems unlikely to be CHF.  Will recommend she use her albuterol more at home as she is not using it at all  during this.  And started on steroids.  Discharged home with return precautions.  Rebecca [name] was evaluated in Emergency Department on 09/28/2019 for the symptoms described  in the history of present illness. She was evaluated in the context of the global COVID-19 pandemic, which necessitated consideration that the patient might be at risk for infection with the SARS-CoV-2 virus that causes COVID-19. Institutional protocols and algorithms that pertain to the evaluation of patients at risk for COVID-19 are in a state of rapid change based on information released by regulatory bodies including the CDC and federal and state organizations. These policies and algorithms were followed during the patient's care in the ED.  Final Clinical Impression(s) / ED Diagnoses Final diagnoses:  COPD exacerbation (HCC)    Rx / DC Orders ED Discharge Orders         Ordered    predniSONE (DELTASONE) 20 MG tablet     09/28/19 1650           Pricilla Loveless, MD 10/01/19 1541

## 2019-09-29 LAB — SARS CORONAVIRUS 2 (TAT 6-24 HRS): SARS Coronavirus 2: NEGATIVE

## 2019-10-07 DIAGNOSIS — J9611 Chronic respiratory failure with hypoxia: Secondary | ICD-10-CM | POA: Diagnosis not present

## 2019-10-07 DIAGNOSIS — J432 Centrilobular emphysema: Secondary | ICD-10-CM | POA: Diagnosis not present

## 2019-10-07 DIAGNOSIS — J449 Chronic obstructive pulmonary disease, unspecified: Secondary | ICD-10-CM | POA: Diagnosis not present

## 2019-10-11 DIAGNOSIS — R194 Change in bowel habit: Secondary | ICD-10-CM | POA: Diagnosis not present

## 2019-10-11 DIAGNOSIS — J449 Chronic obstructive pulmonary disease, unspecified: Secondary | ICD-10-CM | POA: Diagnosis not present

## 2019-10-11 DIAGNOSIS — I10 Essential (primary) hypertension: Secondary | ICD-10-CM | POA: Diagnosis not present

## 2019-10-13 ENCOUNTER — Ambulatory Visit (INDEPENDENT_AMBULATORY_CARE_PROVIDER_SITE_OTHER): Payer: Medicare HMO | Admitting: Acute Care

## 2019-10-13 ENCOUNTER — Telehealth: Payer: Self-pay | Admitting: Adult Health

## 2019-10-13 ENCOUNTER — Other Ambulatory Visit: Payer: Self-pay

## 2019-10-13 ENCOUNTER — Encounter: Payer: Self-pay | Admitting: Acute Care

## 2019-10-13 DIAGNOSIS — J9611 Chronic respiratory failure with hypoxia: Secondary | ICD-10-CM | POA: Diagnosis not present

## 2019-10-13 DIAGNOSIS — J441 Chronic obstructive pulmonary disease with (acute) exacerbation: Secondary | ICD-10-CM

## 2019-10-13 DIAGNOSIS — J449 Chronic obstructive pulmonary disease, unspecified: Secondary | ICD-10-CM

## 2019-10-13 MED ORDER — AZITHROMYCIN 250 MG PO TABS: ORAL_TABLET | ORAL | 0 refills | Status: DC

## 2019-10-13 MED ORDER — PREDNISONE 10 MG PO TABS: ORAL_TABLET | ORAL | 0 refills | Status: DC

## 2019-10-13 NOTE — Telephone Encounter (Signed)
Add as a televisit to my schedule at 1:30. This is the last appointment available on my schedule today.  Thanks

## 2019-10-13 NOTE — Telephone Encounter (Signed)
Called and spoke to pt. Informed her of the recs per SG. Appt scheduled with SG this afternoon at 130p for a televisit. Nothing further needed at this time.

## 2019-10-13 NOTE — Telephone Encounter (Signed)
Called and spoke to pt. Pt c/o increase in SOB x 3 days. Pt states she had an episode last night walking from one room to another and her spo2 was in the 60s on 4lpm. Pt increased her O2 to 5lpm and currently her spo2 is at 97%. Pt denies any cough, CP/tightness, f/c/s, swelling. Pt states she went to ED on 3/16 for SOB and hypoxia and was given pred, pt stated this helped for a few days until she began feeling worse a few days ago. Pt states she does not want to go ED as she was just given pred at the ED and sent home. Will send to APP of the day in Dr. Reginia Naas absence   Maralyn Sago please advise. Thanks.

## 2019-10-13 NOTE — Patient Instructions (Addendum)
It is good to speak with you today. Continue on Stiolto 2 puffs daily .  We will prescribe a prednisone taper. Prednisone taper; 10 mg tablets: 4 tabs x 2 days, 3 tabs x 2 days, 2 tabs x 2 days 1 tab x 2 days then stop. We will prescribe a z pack Take as directed Please eat Activia with probiotic daily while on antibiotics.  Please use your nebs at least twice daily while sick.  Remember you can use them up to 4 times daily. CT chest without contrast in August 2021.  Activity as tolerated Continue on oxygen 4l/m rest and 6l/m activity .  Ask Primary MD if you have received the Pneumovax vaccine.  Get your Covid vaccine if you have not already done so.  Follow-up with Tammy Parrett in 2 weeks   This needs to be April 13 th or 15 th in the morning.  The office will call to confirm time and date.  Please contact office for sooner follow up if symptoms do not improve or worsen or seek emergency care  We need to consider Trelegy as possible option as she is having more frequent exacerbations

## 2019-10-13 NOTE — Progress Notes (Signed)
Virtual Visit via Telephone Note  I connected with Rebecca [name] on 10/13/19 at 12:00 PM EDT by telephone and verified that I am speaking with the correct person using two identifiers.  Location: Patient: At home Provider: [address], [place], [place], [address]   I discussed the limitations, risks, security and privacy concerns of performing an evaluation and management service by telephone and the availability of in person appointments. I also discussed with the patient that there may be a patient responsible charge related to this service. The patient expressed understanding and agreed to proceed.  Synopsis 79 year old female former smoker followed for COPD, chronic respiratory failure on oxygen since 2010 and lung nodules Medical history significant for previous nonischemic cardiomyopathy Lives in independent living at [facility], drives on occasion Recent change from [employer] to [employer] driven Wears home oxygen at 4L at rest and 6L with activity Pt. Has had Covid vaccines x 2  History of Present Illness: Pt. Presents for acute visit. She has been short of breath x 3-4 days. She had a recent ED visit for exacerbation 09/28/2019. She was treated with prednisone 60 mg in the ED, and  Was given a prednisone taper of 40 mg a day x 4 days. She was not treated with antibiotic. CXR and CTA were negative for infiltrates. She denies any swelling in her lower extremities. She states  she does not have an increase in her secretions and they are white to white foamy. She has been compliant with her Stialto and she has been using her albuterol nebs once daily x 4 day. She is supposed to be on 4L and she has had to increase to 5 L. Her oxygen sats on 5 L are good.    Observations/Objective:  09/28/2019 CXR Underlying emphysematous change, grossly stable. No edema or airspace opacity. Stable cardiac silhouette. No adenopathy. Aortic atherosclerosis noted.  09/28/2019  CTA IMPRESSION: 1. No CT evidence of pulmonary embolism. 2. Marked severity emphysematous lung disease.  Spirometry7/2019showed severe airway obstruction with ratio 43, FEV1 of 0.57-31% and FVC of 53%.  CT angio 06/18/18 LUL nodule 26mm  CT chest on February 16, 2019 that showed a stable 1.2 cm left thyroid nodule. Severe emphysema. Irregular 6 mm left upper lobe pulmonary nodule-stable and stable right upper lobe 5 mm pulmonary nodule.   Assessment and Plan: Slow to resolve COPD exacerbation Recent ED visit 09/28/2019>> was prescribed prednisone and told to use nebs Plan: Continue on Stiolto 2 puffs daily .  We will prescribe a prednisone taper. Prednisone taper; 10 mg tablets: 4 tabs x 2 days, 3 tabs x 2 days, 2 tabs x 2 days 1 tab x 2 days then stop. We will prescribe a z pack Take as directed Please eat Activia with probiotic daily while on antibiotics.  Please use your nebs at least twice daily while sick.  Remember you can use them up to 4 times daily. CT chest without contrast in August 2021.  Activity as tolerated Continue on oxygen 4l/m rest and 6l/m activity .  Ask Primary MD if you have received the Pneumovax vaccine.  Get your Covid vaccine if you have not already done so.  Follow-up with Rebecca [name] in 2 weeks   This needs to be April 13 th or 15 th in the morning.  The office will call to confirm time and date.  Please contact office for sooner follow up if symptoms do not improve or worsen or seek emergency care  We  need to consider Trelegy as possible option as she is having more frequent exacerbations  Follow Up Instructions: Follow-up with Rebecca [name] in 2 weeks   This needs to be April 13 th or 15 th in the morning.  The office will call to confirm time and date.     I discussed the assessment and treatment plan with the patient. The patient was provided an opportunity to ask questions and all were answered. The patient agreed with the plan and  demonstrated an understanding of the instructions.   The patient was advised to call back or seek an in-person evaluation if the symptoms worsen or if the condition fails to improve as anticipated.  I provided 30 minutes of non-face-to-face time during this encounter.   Rebecca Spatz, NP 10/13/2019

## 2019-10-14 ENCOUNTER — Telehealth: Payer: Self-pay | Admitting: Pulmonary Disease

## 2019-10-14 ENCOUNTER — Other Ambulatory Visit: Payer: Self-pay | Admitting: Pulmonary Disease

## 2019-10-14 MED ORDER — BEVESPI AEROSPHERE 9-4.8 MCG/ACT IN AERO: 2.0000 | INHALATION_SPRAY | Freq: Two times a day (BID) | RESPIRATORY_TRACT | 5 refills | Status: DC

## 2019-10-14 NOTE — Telephone Encounter (Signed)
Called and spoke with Patient. Patient requested a refill for her neb medication.  Albuterol nebs refill sent to requested pharmacy. Patient requested a new inhaler.  Patient stated Stiolto is not working for her.  Patient stated she is having increased constipation, back aches,  headaches, and exacerbations, since starting Stiolto.  Patient stated she was previously on Anoro, but insurance would no longer cover Anoro, ao she was switched to [employer].  Patient stated she was told a letter of necessity could be sent to insurance and they would cover Anoro. Patient stated she has 20 days left on current Stiolto inhaler, but needs something else, because she doesn't think it is working.   Message routed to Dr. Vassie Loll

## 2019-10-14 NOTE — Telephone Encounter (Signed)
Called and spoke with pt letting her know that we were sending rx for bevespi inhaler to pharmacy to see if this would work better for her. Pt verbalized understanding. Nothing further needed.

## 2019-10-14 NOTE — Telephone Encounter (Signed)
Can we find if alternatives such as Bevespi or breztri are covered ?

## 2019-10-21 ENCOUNTER — Ambulatory Visit: Payer: Medicare HMO | Admitting: Adult Health

## 2019-10-22 ENCOUNTER — Ambulatory Visit (INDEPENDENT_AMBULATORY_CARE_PROVIDER_SITE_OTHER): Payer: Medicare HMO | Admitting: Pulmonary Disease

## 2019-10-22 ENCOUNTER — Other Ambulatory Visit: Payer: Self-pay

## 2019-10-22 DIAGNOSIS — J9611 Chronic respiratory failure with hypoxia: Secondary | ICD-10-CM | POA: Diagnosis not present

## 2019-10-22 DIAGNOSIS — J449 Chronic obstructive pulmonary disease, unspecified: Secondary | ICD-10-CM | POA: Diagnosis not present

## 2019-10-22 DIAGNOSIS — R911 Solitary pulmonary nodule: Secondary | ICD-10-CM

## 2019-10-22 NOTE — Assessment & Plan Note (Signed)
Continue using oxygen 4 to 6 L/min to maintain saturation 88% and above

## 2019-10-22 NOTE — Patient Instructions (Signed)
We will send a letter of necessity to your insurance regarding approval for Anoro In the meantime, we will send prescription to [facility] for- Pulmicort 0.5 mg nebs twice daily x60 Brovana nebs twice daily x60  Until you get this, he can use albuterol nebs up to 4 times daily  Next visit should be in person in the office in 3 months

## 2019-10-22 NOTE — Assessment & Plan Note (Signed)
Stable on imaging.

## 2019-10-22 NOTE — Progress Notes (Signed)
   Subjective:    Patient ID: Rebecca [name], female    DOB: 1941-03-19, 79 y.o.   MRN: [id number]  HPI  I connected with  Rebecca [name] on 10/22/19 by phone and verified that I am speaking with the correct person using two identifiers.   I discussed the limitations of evaluation and management by telemedicine. The patient expressed understanding and agreed to proceed.   79 yo ex-smoker for follow-up of COPD and chronic respiratory failure on oxygen since 2010  She smoked about a pack per day until she quit in 2003, more than 40 pack years Nonischemic cardiomyopathy has recovered   Recent change from [employer] to [employer] driven Stiolto causing increased constipation, back aches,  headaches, and exacerbations, since starting Stiolto.  Patient stated she was previously on Anoro, but insurance would no longer cover Anoro, ao she was switched to [employer] Wears home oxygen at 4L at rest and 6L with activity  ED visit 3/16 for shortness of breath for a few days, she has received her vaccines x2, given prednisone, chest x-ray did not show any infiltrates, CT angiogram was negative for PE and showed severe emphysema She had a follow-up phone visit on 3/31 was given Z-Pak and some more prednisone stiolto was changed to bevespi , this causes burning in her throat, breathing not good, satn ok , uses 5L O2  Significant tests/ events reviewed  Spirometry7/2019showed severe airway obstruction with ratio 43, FEV1 of 0.57-31% and FVC of 53%.  CT angio 06/18/18 LUL nodule 44mm  CT chest 02/2019  showed a stable 1.2 cm left thyroid nodule. Severe emphysema. Irregular 6 mm left upper lobe pulmonary nodule-stable and stable right upper lobe 5 mm pulmonary nodule.  Review of Systems Patient denies significant dyspnea,cough, hemoptysis,  chest pain, palpitations, pedal edema, orthopnea, paroxysmal nocturnal dyspnea, lightheadedness, nausea, vomiting, abdominal or  leg pains       Objective:   Physical Exam   -unable            Assessment & Plan:    Total encounter time x 22 m

## 2019-10-22 NOTE — Assessment & Plan Note (Signed)
We will send a letter of necessity to your insurance regarding approval for Anoro She has failed alternatives including Stiolto and Bevespi  In the meantime, we will send prescription to [facility] for- Pulmicort 0.5 mg nebs twice daily x60 Brovana nebs twice daily x60  Until you get this, he can use albuterol nebs up to 4 times daily

## 2019-10-25 ENCOUNTER — Telehealth: Payer: Self-pay | Admitting: Pulmonary Disease

## 2019-10-25 DIAGNOSIS — N952 Postmenopausal atrophic vaginitis: Secondary | ICD-10-CM | POA: Diagnosis not present

## 2019-10-25 DIAGNOSIS — J449 Chronic obstructive pulmonary disease, unspecified: Secondary | ICD-10-CM | POA: Diagnosis not present

## 2019-10-25 DIAGNOSIS — N200 Calculus of kidney: Secondary | ICD-10-CM | POA: Diagnosis not present

## 2019-10-25 DIAGNOSIS — N281 Cyst of kidney, acquired: Secondary | ICD-10-CM | POA: Diagnosis not present

## 2019-10-25 MED ORDER — BUDESONIDE 0.5 MG/2ML IN SUSP: 0.5000 mg | Freq: Every day | RESPIRATORY_TRACT | 5 refills | Status: DC

## 2019-10-25 MED ORDER — ARFORMOTEROL TARTRATE 15 MCG/2ML IN NEBU: 15.0000 ug | INHALATION_SOLUTION | Freq: Two times a day (BID) | RESPIRATORY_TRACT | 3 refills | Status: DC

## 2019-10-25 NOTE — Telephone Encounter (Signed)
Spoke with pt, she states the Rx's for the nebulizer medication wasn't sent in. According to RA notes, he wanted to send in Brovana and Pulmicort. I sent the Rx's to [facility] pharmacy and nothing further is needed.

## 2019-10-27 ENCOUNTER — Telehealth: Payer: Self-pay | Admitting: Pulmonary Disease

## 2019-10-27 NOTE — Telephone Encounter (Signed)
Spoke with pt, advised her that she should be using the Pulmicort and Brovana separately for each dose per. I asked Beth and to be safe we just agreed to tell pt to use separately. Pt understood and nothing further is needed.

## 2019-11-07 DIAGNOSIS — J449 Chronic obstructive pulmonary disease, unspecified: Secondary | ICD-10-CM | POA: Diagnosis not present

## 2019-11-07 DIAGNOSIS — J432 Centrilobular emphysema: Secondary | ICD-10-CM | POA: Diagnosis not present

## 2019-11-07 DIAGNOSIS — J9611 Chronic respiratory failure with hypoxia: Secondary | ICD-10-CM | POA: Diagnosis not present

## 2019-11-09 ENCOUNTER — Telehealth: Payer: Self-pay | Admitting: Pulmonary Disease

## 2019-11-09 NOTE — Telephone Encounter (Signed)
ATC pt, received fast busy signal x2. Will try back. 

## 2019-11-10 NOTE — Telephone Encounter (Signed)
Spoke with pt. States that she got confused and used Budesonide twice a day instead of once a day. She is now almost out of medication and the prescription can't be refilled until 11/15/19. Advised pt that she could contact her pharmacy and pay out of pocket for the medication but that would be her only option at this point. Pt states that she can't afford the medication with insurance. She states that she will just wait until it's time to refill the prescription.

## 2019-11-11 DIAGNOSIS — K5900 Constipation, unspecified: Secondary | ICD-10-CM | POA: Diagnosis not present

## 2019-11-15 DIAGNOSIS — J449 Chronic obstructive pulmonary disease, unspecified: Secondary | ICD-10-CM | POA: Diagnosis not present

## 2019-11-16 ENCOUNTER — Other Ambulatory Visit: Payer: Self-pay

## 2019-11-16 ENCOUNTER — Ambulatory Visit
Admission: RE | Admit: 2019-11-16 | Discharge: 2019-11-16 | Disposition: A | Discharge status: Home / Self Care | Payer: Medicare HMO | Source: Ambulatory Visit | Attending: Family Medicine | Admitting: Family Medicine

## 2019-11-16 DIAGNOSIS — E2839 Other primary ovarian failure: Secondary | ICD-10-CM

## 2019-11-16 DIAGNOSIS — Z780 Asymptomatic menopausal state: Secondary | ICD-10-CM | POA: Diagnosis not present

## 2019-11-16 DIAGNOSIS — M85852 Other specified disorders of bone density and structure, left thigh: Secondary | ICD-10-CM | POA: Diagnosis not present

## 2019-11-17 ENCOUNTER — Telehealth: Payer: Self-pay | Admitting: Adult Health

## 2019-11-17 MED ORDER — ALBUTEROL SULFATE (2.5 MG/3ML) 0.083% IN NEBU: INHALATION_SOLUTION | RESPIRATORY_TRACT | 4 refills | Status: DC

## 2019-11-17 NOTE — Telephone Encounter (Signed)
Spoke with patient regarding refill for Albuterol2.5 MG/3ML(NEB) with 4 refills . Advised patient I would send refill to [facility] [address].Patient's voice was understanding. Nothing else further needed.

## 2019-11-23 ENCOUNTER — Ambulatory Visit: Payer: Medicare HMO | Admitting: Adult Health

## 2019-11-23 ENCOUNTER — Encounter: Payer: Self-pay | Admitting: Adult Health

## 2019-11-23 ENCOUNTER — Other Ambulatory Visit: Payer: Self-pay

## 2019-11-23 VITALS — BP 164/92 | HR 86 | Ht 62.0 in | Wt 161.0 lb

## 2019-11-23 DIAGNOSIS — J9611 Chronic respiratory failure with hypoxia: Secondary | ICD-10-CM | POA: Diagnosis not present

## 2019-11-23 DIAGNOSIS — J449 Chronic obstructive pulmonary disease, unspecified: Secondary | ICD-10-CM

## 2019-11-23 NOTE — Assessment & Plan Note (Signed)
Severe COPD with high symptom burden , High oxygen dependence. No change in status on Nebs will leave the same for now  Recommend pulmonary rehab or PT -declines   Plan  Patient Instructions  Continue on Budesonide Neb daily  Continue on Brovana Twice daily  .  Albuterol Neb As needed  .  Activity as tolerated Call back if you change mind on Pulmonary rehab or PT at your center.  Continue on oxygen 5l/m rest and 6l/m activity . O2 sats goal >88-90%.  Follow-up with Dr. Vassie Loll or Parrett NP in 3-4  months and As needed

## 2019-11-23 NOTE — Progress Notes (Signed)
@Patient  ID: Dorie Rank, female    DOB: 1941/02/27, 79 y.o.   MRN: [id number]  Chief Complaint  Patient presents with  . Follow-up    COPD     Referring provider: London Pepper, MD  HPI: 79 year old female former smoker followed for COPD, chronic respiratory failure on oxygen since 2010 and lung nodules Medical history significant for previous nonischemic cardiomyopathy Lives in independent living at [facility] and drives on occasion  TEST/EVENTS :  Spirometry7/2019showed severe airway obstruction with ratio 43, FEV1 of 0.57-31% and FVC of 53%.  CT angio 06/18/18 LUL nodule 44mm  CT chest on February 16, 2019 that showed a stable 1.2 cm left thyroid nodule. Severe emphysema. Irregular 6 mm left upper lobe pulmonary nodule-stable and stable right upper lobe 5 mm pulmonary nodule.    11/23/2019 Follow up : COPD and oxygen dependent respiratory failure Patient returns for a 1 month follow-up.  Last visit patient was having difficulty with insurance covering her inhalers including Stiolto Anoro and [employer].  She was changed over to Pulmicort nebulizer twice daily and Brovana nebulizer twice daily.  Prescription for Pulmicort was sent in as daily.  She denies any significant change in her breathing since beginning the nebulizers.  She still has significant activity intolerance and shortness of breath with minimum activity.  She remains on oxygen 5 L at rest and 6 L with activity.  CT chest September 28, 2019 was negative for PE showed marked severity of emphysematous lung disease.   Allergies  Allergen Reactions  . Amoxicillin Rash  . Lisinopril Rash  . Sulfacetamide Rash  . Tetracyclines & Related Rash    Immunization History  Administered Date(s) Administered  . Influenza Split 03/16/2019  . Influenza, High Dose Seasonal PF 04/14/2018  . Moderna SARS-COVID-2 Vaccination 08/16/2019  . Pneumococcal Conjugate-13 02/05/2018  . Tdap 07/27/2018  . Zoster Recombinat  (Shingrix) 07/14/2018, 09/14/2018    Past Medical History:  Diagnosis Date  . Congestive heart disease ([place])   . COPD (chronic obstructive pulmonary disease) ([place])   . Diabetes ([place])   . Hyperlipemia   . Osteoporosis   . Primary hypertension     Tobacco History: Social History   Tobacco Use  Smoking Status Former Smoker  . Packs/day: 1.50  . Years: 40.00  . Pack years: 60.00  . Types: Cigarettes  . Quit date: 2003  . Years since quitting: 18.3  Smokeless Tobacco Never Used   Counseling given: Not Answered   Outpatient Medications Prior to Visit  Medication Sig Dispense Refill  . albuterol (PROVENTIL HFA;VENTOLIN HFA) 108 (90 Base) MCG/ACT inhaler Inhale 1-2 puffs into the lungs every 6 (six) hours as needed for wheezing or shortness of breath. 1 Inhaler 5  . albuterol (PROVENTIL) (2.5 MG/3ML) 0.083% nebulizer solution PLEASE SEE ATTACHED FOR DETAILED DIRECTIONS 270 mL 4  . arformoterol (BROVANA) 15 MCG/2ML NEBU Take 2 mLs (15 mcg total) by nebulization 2 (two) times daily. 120 mL 3  . budesonide (PULMICORT) 0.5 MG/2ML nebulizer solution Take 2 mLs (0.5 mg total) by nebulization daily. 60 mL 5  . Calcium Carbonate-Vitamin D (CALCIUM 600+D) 600-200 MG-UNIT TABS Take 1 tablet by mouth daily.     . carvedilol (COREG) 25 MG tablet Take 25 mg by mouth 2 (two) times daily.     Marland Kitchen estradiol (ESTRACE) 0.1 MG/GM vaginal cream Place 1 Applicatorful vaginally every other day.     . metFORMIN (GLUCOPHAGE) 500 MG tablet Take 500 mg by mouth daily with supper.     Marland Kitchen  pravastatin (PRAVACHOL) 10 MG tablet Take 10 mg by mouth daily.     . furosemide (LASIX) 20 MG tablet Take 1 tablet (20 mg total) by mouth daily. (Patient taking differently: Take 20 mg by mouth daily as needed for fluid or edema. ) 5 tablet 0  . Glycopyrrolate-Formoterol (BEVESPI AEROSPHERE) 9-4.8 MCG/ACT AERO Inhale 2 puffs into the lungs in the morning and at bedtime. 10.7 g 5   No facility-administered medications prior to  visit.     Review of Systems:   Constitutional:   No  weight loss, night sweats,  Fevers, chills,  +fatigue, or  lassitude.  HEENT:   No headaches,  Difficulty swallowing,  Tooth/dental problems, or  Sore throat,                No sneezing, itching, ear ache, nasal congestion, post nasal drip,   CV:  No chest pain,  Orthopnea, PND, swelling in lower extremities, anasarca, dizziness, palpitations, syncope.   GI  No heartburn, indigestion, abdominal pain, nausea, vomiting, diarrhea, change in bowel habits, loss of appetite, bloody stools.   Resp:  .  No chest wall deformity  Skin: no rash or lesions.  GU: no dysuria, change in color of urine, no urgency or frequency.  No flank pain, no hematuria   MS:  No joint pain or swelling.  No decreased range of motion.  No back pain.    Physical Exam  BP (!) 164/92 (BP Location: Left Arm, Cuff Size: Normal)   Pulse 86   Ht 5\' 2"  (1.575 m)   Wt 161 lb (73 kg)   SpO2 96%   BMI 29.45 kg/m   GEN: A/Ox3; pleasant , NAD, elderly on O2    HEENT:  [place]/AT,  , NOSE-clear, THROAT-clear, no lesions, no postnasal drip or exudate noted.   NECK:  Supple w/ fair ROM; no JVD; normal carotid impulses w/o bruits; no thyromegaly or nodules palpated; no lymphadenopathy.    RESP  Decreased BS in bases  no accessory muscle use, no dullness to percussion  CARD:  RRR, no m/r/g, no peripheral edema, pulses intact, no cyanosis or clubbing.  GI:   Soft & nt; nml bowel sounds; no organomegaly or masses detected.   Musco: Warm bil, no deformities or joint swelling noted.   Neuro: alert, no focal deficits noted.    Skin: Warm, no lesions or rashes    Lab Results:  CBC   BNP ProBNP  Imaging:     No flowsheet data found.  No results found for: NITRICOXIDE      Assessment & Plan:   COPD with chronic bronchitis and emphysema (HCC) Severe COPD with high symptom burden , High oxygen dependence. No change in status on Nebs will leave the  same for now  Recommend pulmonary rehab or PT -declines   Plan  Patient Instructions  Continue on Budesonide Neb daily  Continue on Brovana Twice daily  .  Albuterol Neb As needed  .  Activity as tolerated Call back if you change mind on Pulmonary rehab or PT at your center.  Continue on oxygen 5l/m rest and 6l/m activity . O2 sats goal >88-90%.  Follow-up with Dr. or Sheron Tallman NP in 3-4  months and As needed       Chronic respiratory failure with hypoxia (HCC) Cont on O2 to keep sats >88-90%.      Vassie Loll, NP 11/23/2019

## 2019-11-23 NOTE — Patient Instructions (Addendum)
Continue on Budesonide Neb daily  Continue on Brovana Twice daily  .  Albuterol Neb As needed  .  Activity as tolerated Call back if you change mind on Pulmonary rehab or PT at your center.  Continue on oxygen 5l/m rest and 6l/m activity . O2 sats goal >88-90%.  Follow-up with Dr. Vassie Loll or Calder Oblinger NP in 3-4  months and As needed

## 2019-11-23 NOTE — Assessment & Plan Note (Signed)
Cont on O2 to keep sats >88-90%  

## 2019-11-26 DIAGNOSIS — M8580 Other specified disorders of bone density and structure, unspecified site: Secondary | ICD-10-CM | POA: Diagnosis not present

## 2019-12-05 DIAGNOSIS — J0100 Acute maxillary sinusitis, unspecified: Secondary | ICD-10-CM | POA: Diagnosis not present

## 2019-12-07 DIAGNOSIS — J9611 Chronic respiratory failure with hypoxia: Secondary | ICD-10-CM | POA: Diagnosis not present

## 2019-12-07 DIAGNOSIS — J432 Centrilobular emphysema: Secondary | ICD-10-CM | POA: Diagnosis not present

## 2019-12-07 DIAGNOSIS — J449 Chronic obstructive pulmonary disease, unspecified: Secondary | ICD-10-CM | POA: Diagnosis not present

## 2019-12-14 ENCOUNTER — Telehealth: Payer: Self-pay | Admitting: Adult Health

## 2019-12-14 DIAGNOSIS — J449 Chronic obstructive pulmonary disease, unspecified: Secondary | ICD-10-CM

## 2019-12-14 NOTE — Telephone Encounter (Signed)
Spoke with the pt  She states her nebulizer machine is 79 y/o and making a lot of noises  She is asking for order to be sent to [facility] for a new machine  Order sent  Nothing further needed

## 2019-12-16 ENCOUNTER — Telehealth: Payer: Self-pay | Admitting: Adult Health

## 2019-12-16 DIAGNOSIS — J9611 Chronic respiratory failure with hypoxia: Secondary | ICD-10-CM | POA: Diagnosis not present

## 2019-12-16 DIAGNOSIS — J449 Chronic obstructive pulmonary disease, unspecified: Secondary | ICD-10-CM | POA: Diagnosis not present

## 2019-12-16 DIAGNOSIS — J432 Centrilobular emphysema: Secondary | ICD-10-CM | POA: Diagnosis not present

## 2019-12-16 NOTE — Telephone Encounter (Signed)
Spoke with patient. She is aware that TP is ok with pulmonary rehab. [employer] but no one answered. Left a message for someone to call us back.

## 2019-12-16 NOTE — Telephone Encounter (Signed)
Yes that is great, thank you!

## 2019-12-16 NOTE — Telephone Encounter (Signed)
Spoke with the pt  She states that she has decided that she would like to do pulmonary rehab  She wants to do this through [facility] at [facility] where she resides  Please advise if you are still okay with this  If so I will go ahead and give them a call at [phone]  Thanks

## 2019-12-20 NOTE — Telephone Encounter (Signed)
Attempted to call Robin from [facility] but received a busy tone. Tried to call back but again received a busy tone. Will try to call back later.

## 2019-12-20 NOTE — Telephone Encounter (Signed)
Robin from [facility] is returning phone call for patient's Pulmonary Rehab. Robin phone number is [phone].

## 2019-12-21 DIAGNOSIS — J449 Chronic obstructive pulmonary disease, unspecified: Secondary | ICD-10-CM | POA: Diagnosis not present

## 2019-12-21 NOTE — Telephone Encounter (Signed)
Called Rebecca [name] but there was no answer. LM for her to return call.

## 2019-12-22 NOTE — Telephone Encounter (Signed)
Spoke with Zella Ball, she needs an order for pt to start pulmonary rehab there. TP can we send order. Please advise.    [phone]

## 2019-12-23 NOTE — Telephone Encounter (Signed)
Yes that is fine , will have to check to make sure they accept NP orders

## 2019-12-23 NOTE — Telephone Encounter (Signed)
Order has been placed for pulmonary rehab.  

## 2019-12-24 ENCOUNTER — Telehealth: Payer: Self-pay | Admitting: Adult Health

## 2019-12-24 DIAGNOSIS — J449 Chronic obstructive pulmonary disease, unspecified: Secondary | ICD-10-CM

## 2019-12-24 NOTE — Telephone Encounter (Signed)
Order was placed yesterday but was sent to [facility] instead of [facility] at [facility]. Left detailed message for Zella Ball that I will replace the order to their location.   Order has been placed.   Will close this encounter.

## 2019-12-24 NOTE — Telephone Encounter (Signed)
Noted. The referral for pulmonary rehab was sent yesterday to [facility]. I called Carlette to make her aware. Nothing further is needed.    Procedure Information  Service Details Procedure Modifiers Provider Requested Approved  [id number] - AMB REFERRAL TO PULMONARY REHABILITATION None  1 1  Diagnosis Information  Diagnosis  J44.9 (ICD-10-CM) - COPD with chronic bronchitis and emphysema (HCC)  Referral Notes Number of Notes: 1 . Type Date User Summary Attachment  Provider Comments 12/23/2019 3:51 PM Lorel Monaco, CMA Provider Comments -  Note   [facility]

## 2019-12-27 ENCOUNTER — Telehealth: Payer: Self-pay | Admitting: Pulmonary Disease

## 2019-12-27 DIAGNOSIS — R2681 Unsteadiness on feet: Secondary | ICD-10-CM | POA: Diagnosis not present

## 2019-12-27 DIAGNOSIS — M6281 Muscle weakness (generalized): Secondary | ICD-10-CM | POA: Diagnosis not present

## 2019-12-27 MED ORDER — ALBUTEROL SULFATE HFA 108 (90 BASE) MCG/ACT IN AERS: 1.0000 | INHALATION_SPRAY | Freq: Four times a day (QID) | RESPIRATORY_TRACT | 5 refills | Status: DC | PRN

## 2019-12-27 NOTE — Telephone Encounter (Signed)
Called and spoke with pt and verified med and preferred pharmacy. Refill of pt's albuterol hfa inhaler has been sent to preferred pharmacy for pt. Nothing further needed.

## 2019-12-29 DIAGNOSIS — R2681 Unsteadiness on feet: Secondary | ICD-10-CM | POA: Diagnosis not present

## 2019-12-29 DIAGNOSIS — M6281 Muscle weakness (generalized): Secondary | ICD-10-CM | POA: Diagnosis not present

## 2019-12-30 DIAGNOSIS — M6281 Muscle weakness (generalized): Secondary | ICD-10-CM | POA: Diagnosis not present

## 2019-12-30 DIAGNOSIS — R2681 Unsteadiness on feet: Secondary | ICD-10-CM | POA: Diagnosis not present

## 2020-01-04 DIAGNOSIS — J0100 Acute maxillary sinusitis, unspecified: Secondary | ICD-10-CM | POA: Diagnosis not present

## 2020-01-04 DIAGNOSIS — R350 Frequency of micturition: Secondary | ICD-10-CM | POA: Diagnosis not present

## 2020-01-05 DIAGNOSIS — R2681 Unsteadiness on feet: Secondary | ICD-10-CM | POA: Diagnosis not present

## 2020-01-05 DIAGNOSIS — M6281 Muscle weakness (generalized): Secondary | ICD-10-CM | POA: Diagnosis not present

## 2020-01-07 DIAGNOSIS — J9611 Chronic respiratory failure with hypoxia: Secondary | ICD-10-CM | POA: Diagnosis not present

## 2020-01-07 DIAGNOSIS — J432 Centrilobular emphysema: Secondary | ICD-10-CM | POA: Diagnosis not present

## 2020-01-07 DIAGNOSIS — M6281 Muscle weakness (generalized): Secondary | ICD-10-CM | POA: Diagnosis not present

## 2020-01-07 DIAGNOSIS — J449 Chronic obstructive pulmonary disease, unspecified: Secondary | ICD-10-CM | POA: Diagnosis not present

## 2020-01-07 DIAGNOSIS — R2681 Unsteadiness on feet: Secondary | ICD-10-CM | POA: Diagnosis not present

## 2020-01-10 DIAGNOSIS — M6281 Muscle weakness (generalized): Secondary | ICD-10-CM | POA: Diagnosis not present

## 2020-01-10 DIAGNOSIS — R2681 Unsteadiness on feet: Secondary | ICD-10-CM | POA: Diagnosis not present

## 2020-01-12 ENCOUNTER — Emergency Department (HOSPITAL_COMMUNITY): Payer: Medicare HMO

## 2020-01-12 ENCOUNTER — Observation Stay (HOSPITAL_COMMUNITY): Payer: Medicare HMO

## 2020-01-12 ENCOUNTER — Inpatient Hospital Stay (HOSPITAL_COMMUNITY)
Admission: EM | Admit: 2020-01-12 | Discharge: 2020-01-17 | DRG: 190 | Disposition: A | Discharge status: Home / Self Care | Payer: Medicare HMO | Attending: Internal Medicine | Admitting: Internal Medicine

## 2020-01-12 ENCOUNTER — Other Ambulatory Visit: Payer: Self-pay

## 2020-01-12 ENCOUNTER — Encounter (HOSPITAL_COMMUNITY): Payer: Self-pay

## 2020-01-12 DIAGNOSIS — I700 Atherosclerosis of aorta: Secondary | ICD-10-CM | POA: Diagnosis not present

## 2020-01-12 DIAGNOSIS — Z9981 Dependence on supplemental oxygen: Secondary | ICD-10-CM | POA: Diagnosis not present

## 2020-01-12 DIAGNOSIS — E538 Deficiency of other specified B group vitamins: Secondary | ICD-10-CM | POA: Diagnosis present

## 2020-01-12 DIAGNOSIS — Z7189 Other specified counseling: Secondary | ICD-10-CM

## 2020-01-12 DIAGNOSIS — Z79899 Other long term (current) drug therapy: Secondary | ICD-10-CM

## 2020-01-12 DIAGNOSIS — J341 Cyst and mucocele of nose and nasal sinus: Secondary | ICD-10-CM | POA: Diagnosis not present

## 2020-01-12 DIAGNOSIS — J9612 Chronic respiratory failure with hypercapnia: Secondary | ICD-10-CM | POA: Diagnosis present

## 2020-01-12 DIAGNOSIS — I110 Hypertensive heart disease with heart failure: Secondary | ICD-10-CM | POA: Diagnosis present

## 2020-01-12 DIAGNOSIS — M810 Age-related osteoporosis without current pathological fracture: Secondary | ICD-10-CM | POA: Diagnosis present

## 2020-01-12 DIAGNOSIS — Z7984 Long term (current) use of oral hypoglycemic drugs: Secondary | ICD-10-CM

## 2020-01-12 DIAGNOSIS — I509 Heart failure, unspecified: Secondary | ICD-10-CM | POA: Clinically undetermined

## 2020-01-12 DIAGNOSIS — Z515 Encounter for palliative care: Secondary | ICD-10-CM | POA: Diagnosis not present

## 2020-01-12 DIAGNOSIS — E785 Hyperlipidemia, unspecified: Secondary | ICD-10-CM | POA: Diagnosis present

## 2020-01-12 DIAGNOSIS — J9611 Chronic respiratory failure with hypoxia: Secondary | ICD-10-CM | POA: Diagnosis present

## 2020-01-12 DIAGNOSIS — M6281 Muscle weakness (generalized): Secondary | ICD-10-CM | POA: Diagnosis not present

## 2020-01-12 DIAGNOSIS — R519 Headache, unspecified: Secondary | ICD-10-CM | POA: Diagnosis present

## 2020-01-12 DIAGNOSIS — J441 Chronic obstructive pulmonary disease with (acute) exacerbation: Secondary | ICD-10-CM | POA: Diagnosis not present

## 2020-01-12 DIAGNOSIS — I10 Essential (primary) hypertension: Secondary | ICD-10-CM | POA: Diagnosis not present

## 2020-01-12 DIAGNOSIS — J9601 Acute respiratory failure with hypoxia: Secondary | ICD-10-CM | POA: Diagnosis present

## 2020-01-12 DIAGNOSIS — J9621 Acute and chronic respiratory failure with hypoxia: Secondary | ICD-10-CM | POA: Diagnosis not present

## 2020-01-12 DIAGNOSIS — G9341 Metabolic encephalopathy: Secondary | ICD-10-CM | POA: Diagnosis present

## 2020-01-12 DIAGNOSIS — R2681 Unsteadiness on feet: Secondary | ICD-10-CM | POA: Diagnosis not present

## 2020-01-12 DIAGNOSIS — I6782 Cerebral ischemia: Secondary | ICD-10-CM | POA: Diagnosis not present

## 2020-01-12 DIAGNOSIS — Z881 Allergy status to other antibiotic agents status: Secondary | ICD-10-CM | POA: Diagnosis not present

## 2020-01-12 DIAGNOSIS — R4781 Slurred speech: Secondary | ICD-10-CM | POA: Diagnosis present

## 2020-01-12 DIAGNOSIS — E119 Type 2 diabetes mellitus without complications: Secondary | ICD-10-CM | POA: Diagnosis present

## 2020-01-12 DIAGNOSIS — Z20822 Contact with and (suspected) exposure to covid-19: Secondary | ICD-10-CM | POA: Diagnosis present

## 2020-01-12 DIAGNOSIS — I2510 Atherosclerotic heart disease of native coronary artery without angina pectoris: Secondary | ICD-10-CM | POA: Diagnosis not present

## 2020-01-12 DIAGNOSIS — J449 Chronic obstructive pulmonary disease, unspecified: Secondary | ICD-10-CM | POA: Diagnosis present

## 2020-01-12 DIAGNOSIS — Z7951 Long term (current) use of inhaled steroids: Secondary | ICD-10-CM

## 2020-01-12 DIAGNOSIS — Z87891 Personal history of nicotine dependence: Secondary | ICD-10-CM

## 2020-01-12 DIAGNOSIS — K449 Diaphragmatic hernia without obstruction or gangrene: Secondary | ICD-10-CM | POA: Diagnosis not present

## 2020-01-12 DIAGNOSIS — J439 Emphysema, unspecified: Secondary | ICD-10-CM | POA: Diagnosis not present

## 2020-01-12 DIAGNOSIS — Z888 Allergy status to other drugs, medicaments and biological substances status: Secondary | ICD-10-CM | POA: Diagnosis not present

## 2020-01-12 DIAGNOSIS — R531 Weakness: Secondary | ICD-10-CM

## 2020-01-12 DIAGNOSIS — Z66 Do not resuscitate: Secondary | ICD-10-CM | POA: Diagnosis not present

## 2020-01-12 DIAGNOSIS — R251 Tremor, unspecified: Secondary | ICD-10-CM | POA: Diagnosis not present

## 2020-01-12 DIAGNOSIS — G4489 Other headache syndrome: Secondary | ICD-10-CM | POA: Diagnosis not present

## 2020-01-12 DIAGNOSIS — J432 Centrilobular emphysema: Secondary | ICD-10-CM | POA: Diagnosis not present

## 2020-01-12 DIAGNOSIS — J9622 Acute and chronic respiratory failure with hypercapnia: Secondary | ICD-10-CM | POA: Diagnosis not present

## 2020-01-12 DIAGNOSIS — Z209 Contact with and (suspected) exposure to unspecified communicable disease: Secondary | ICD-10-CM | POA: Diagnosis not present

## 2020-01-12 DIAGNOSIS — I428 Other cardiomyopathies: Secondary | ICD-10-CM | POA: Diagnosis present

## 2020-01-12 DIAGNOSIS — K5900 Constipation, unspecified: Secondary | ICD-10-CM | POA: Diagnosis not present

## 2020-01-12 DIAGNOSIS — R0602 Shortness of breath: Secondary | ICD-10-CM | POA: Diagnosis not present

## 2020-01-12 DIAGNOSIS — R4182 Altered mental status, unspecified: Secondary | ICD-10-CM | POA: Diagnosis not present

## 2020-01-12 LAB — CBC WITH DIFFERENTIAL/PLATELET
Abs Immature Granulocytes: 0.02 10*3/uL (ref 0.00–0.07)
Basophils Absolute: 0 10*3/uL (ref 0.0–0.1)
Basophils Relative: 0 %
Eosinophils Absolute: 0 10*3/uL (ref 0.0–0.5)
Eosinophils Relative: 0 %
HCT: 41.1 % (ref 36.0–46.0)
Hemoglobin: 12.3 g/dL (ref 12.0–15.0)
Immature Granulocytes: 0 %
Lymphocytes Relative: 18 %
Lymphs Abs: 1.1 10*3/uL (ref 0.7–4.0)
MCH: 30.9 pg (ref 26.0–34.0)
MCHC: 29.9 g/dL — ABNORMAL LOW (ref 30.0–36.0)
MCV: 103.3 fL — ABNORMAL HIGH (ref 80.0–100.0)
Monocytes Absolute: 0.6 10*3/uL (ref 0.1–1.0)
Monocytes Relative: 9 %
Neutro Abs: 4.4 10*3/uL (ref 1.7–7.7)
Neutrophils Relative %: 73 %
Platelets: 186 10*3/uL (ref 150–400)
RBC: 3.98 MIL/uL (ref 3.87–5.11)
RDW: 12.3 % (ref 11.5–15.5)
WBC: 6.1 10*3/uL (ref 4.0–10.5)
nRBC: 0 % (ref 0.0–0.2)

## 2020-01-12 LAB — I-STAT VENOUS BLOOD GAS, ED
Acid-Base Excess: 21 mmol/L — ABNORMAL HIGH (ref 0.0–2.0)
Bicarbonate: 50.2 mmol/L — ABNORMAL HIGH (ref 20.0–28.0)
Calcium, Ion: 1.13 mmol/L — ABNORMAL LOW (ref 1.15–1.40)
HCT: 39 % (ref 36.0–46.0)
Hemoglobin: 13.3 g/dL (ref 12.0–15.0)
O2 Saturation: 86 %
Potassium: 4.9 mmol/L (ref 3.5–5.1)
Sodium: 135 mmol/L (ref 135–145)
TCO2: >50 mmol/L — ABNORMAL HIGH (ref 22–32)
pCO2, Ven: 82.5 mmHg (ref 44.0–60.0)
pH, Ven: 7.392 (ref 7.250–7.430)
pO2, Ven: 57 mmHg — ABNORMAL HIGH (ref 32.0–45.0)

## 2020-01-12 LAB — POTASSIUM: Potassium: 5 mmol/L (ref 3.5–5.1)

## 2020-01-12 LAB — URINALYSIS, ROUTINE W REFLEX MICROSCOPIC
Bilirubin Urine: NEGATIVE
Glucose, UA: NEGATIVE mg/dL
Ketones, ur: 5 mg/dL — AB
Leukocytes,Ua: NEGATIVE
Nitrite: NEGATIVE
Protein, ur: NEGATIVE mg/dL
Specific Gravity, Urine: 1.008 (ref 1.005–1.030)
pH: 6 (ref 5.0–8.0)

## 2020-01-12 LAB — COMPREHENSIVE METABOLIC PANEL
ALT: 17 U/L (ref 0–44)
AST: 24 U/L (ref 15–41)
Albumin: 3.5 g/dL (ref 3.5–5.0)
Alkaline Phosphatase: 47 U/L (ref 38–126)
Anion gap: 8 (ref 5–15)
BUN: 14 mg/dL (ref 8–23)
CO2: 40 mmol/L — ABNORMAL HIGH (ref 22–32)
Calcium: 9.3 mg/dL (ref 8.9–10.3)
Chloride: 89 mmol/L — ABNORMAL LOW (ref 98–111)
Creatinine, Ser: 0.51 mg/dL (ref 0.44–1.00)
GFR calc Af Amer: >60 mL/min (ref >60)
GFR calc non Af Amer: >60 mL/min (ref >60)
Glucose, Bld: 116 mg/dL — ABNORMAL HIGH (ref 70–99)
Potassium: 5.9 mmol/L — ABNORMAL HIGH (ref 3.5–5.1)
Sodium: 137 mmol/L (ref 135–145)
Total Bilirubin: 1 mg/dL (ref 0.3–1.2)
Total Protein: 6.2 g/dL — ABNORMAL LOW (ref 6.5–8.1)

## 2020-01-12 LAB — APTT: aPTT: 29 seconds (ref 24–36)

## 2020-01-12 LAB — SARS CORONAVIRUS 2 BY RT PCR (HOSPITAL ORDER, PERFORMED IN ~~LOC~~ HOSPITAL LAB): SARS Coronavirus 2: NEGATIVE

## 2020-01-12 LAB — BRAIN NATRIURETIC PEPTIDE: B Natriuretic Peptide: 76.7 pg/mL (ref 0.0–100.0)

## 2020-01-12 LAB — PROTIME-INR
INR: 0.9 (ref 0.8–1.2)
Prothrombin Time: 12 seconds (ref 11.4–15.2)

## 2020-01-12 LAB — AMMONIA: Ammonia: 19 umol/L (ref 9–35)

## 2020-01-12 MED ORDER — SODIUM CHLORIDE 0.9% FLUSH
3.0000 mL | Freq: Two times a day (BID) | INTRAVENOUS | Status: DC
  Administered 2020-01-13 – 2020-01-17 (×8): 3 mL via INTRAVENOUS

## 2020-01-12 MED ORDER — ENOXAPARIN SODIUM 40 MG/0.4ML ~~LOC~~ SOLN
40.0000 mg | SUBCUTANEOUS | Status: DC
  Administered 2020-01-13 – 2020-01-16 (×4): 40 mg via SUBCUTANEOUS
  Filled 2020-01-12 (×4): qty 0.4

## 2020-01-12 MED ORDER — PRAVASTATIN SODIUM 10 MG PO TABS
10.0000 mg | ORAL_TABLET | Freq: Every day | ORAL | Status: DC
  Administered 2020-01-12 – 2020-01-17 (×6): 10 mg via ORAL
  Filled 2020-01-12 (×8): qty 1

## 2020-01-12 MED ORDER — ALBUTEROL SULFATE HFA 108 (90 BASE) MCG/ACT IN AERS
4.0000 | INHALATION_SPRAY | Freq: Once | RESPIRATORY_TRACT | Status: AC
  Administered 2020-01-12: 4 via RESPIRATORY_TRACT
  Filled 2020-01-12: qty 6.7

## 2020-01-12 MED ORDER — IPRATROPIUM BROMIDE HFA 17 MCG/ACT IN AERS
3.0000 | INHALATION_SPRAY | Freq: Once | RESPIRATORY_TRACT | Status: AC
  Administered 2020-01-12: 3 via RESPIRATORY_TRACT
  Filled 2020-01-12: qty 12.9

## 2020-01-12 MED ORDER — ONDANSETRON HCL 4 MG/2ML IJ SOLN: 4.0000 mg | Freq: Four times a day (QID) | INTRAMUSCULAR | Status: DC | PRN

## 2020-01-12 MED ORDER — TRAMADOL HCL 50 MG PO TABS: 50.0000 mg | ORAL_TABLET | Freq: Three times a day (TID) | ORAL | Status: DC | PRN

## 2020-01-12 MED ORDER — IOHEXOL 350 MG/ML SOLN
75.0000 mL | Freq: Once | INTRAVENOUS | Status: AC | PRN
  Administered 2020-01-12: 75 mL via INTRAVENOUS

## 2020-01-12 MED ORDER — SODIUM CHLORIDE 0.9 % IV SOLN: 250.0000 mL | INTRAVENOUS | Status: DC | PRN

## 2020-01-12 MED ORDER — METHYLPREDNISOLONE SODIUM SUCC 125 MG IJ SOLR
125.0000 mg | Freq: Once | INTRAMUSCULAR | Status: AC
  Administered 2020-01-12: 125 mg via INTRAVENOUS
  Filled 2020-01-12: qty 2

## 2020-01-12 MED ORDER — ALBUTEROL SULFATE HFA 108 (90 BASE) MCG/ACT IN AERS
3.0000 | INHALATION_SPRAY | Freq: Once | RESPIRATORY_TRACT | Status: AC
  Administered 2020-01-12: 3 via RESPIRATORY_TRACT

## 2020-01-12 MED ORDER — SODIUM CHLORIDE 0.9% FLUSH
3.0000 mL | INTRAVENOUS | Status: DC | PRN
  Administered 2020-01-15: 3 mL via INTRAVENOUS

## 2020-01-12 MED ORDER — GUAIFENESIN ER 600 MG PO TB12
600.0000 mg | ORAL_TABLET | Freq: Two times a day (BID) | ORAL | Status: DC
  Administered 2020-01-12 – 2020-01-17 (×10): 600 mg via ORAL
  Filled 2020-01-12 (×11): qty 1

## 2020-01-12 MED ORDER — ALBUTEROL SULFATE HFA 108 (90 BASE) MCG/ACT IN AERS
1.0000 | INHALATION_SPRAY | Freq: Four times a day (QID) | RESPIRATORY_TRACT | Status: DC | PRN
  Filled 2020-01-12: qty 6.7

## 2020-01-12 MED ORDER — BUDESONIDE 0.5 MG/2ML IN SUSP
0.5000 mg | Freq: Every day | RESPIRATORY_TRACT | Status: DC
  Administered 2020-01-14 – 2020-01-17 (×4): 0.5 mg via RESPIRATORY_TRACT
  Filled 2020-01-12 (×6): qty 2

## 2020-01-12 MED ORDER — CARVEDILOL 25 MG PO TABS
25.0000 mg | ORAL_TABLET | Freq: Two times a day (BID) | ORAL | Status: DC
  Administered 2020-01-13 – 2020-01-17 (×10): 25 mg via ORAL
  Filled 2020-01-12 (×3): qty 1
  Filled 2020-01-12: qty 2
  Filled 2020-01-12 (×6): qty 1

## 2020-01-12 MED ORDER — METHYLPREDNISOLONE SODIUM SUCC 40 MG IJ SOLR
40.0000 mg | Freq: Two times a day (BID) | INTRAMUSCULAR | Status: DC
  Administered 2020-01-12 – 2020-01-15 (×7): 40 mg via INTRAVENOUS
  Filled 2020-01-12 (×7): qty 1

## 2020-01-12 MED ORDER — HYDRALAZINE HCL 10 MG PO TABS
10.0000 mg | ORAL_TABLET | Freq: Four times a day (QID) | ORAL | Status: DC | PRN
  Filled 2020-01-12 (×2): qty 1

## 2020-01-12 MED ORDER — CALCIUM CARBONATE-VITAMIN D 500-200 MG-UNIT PO TABS
1.0000 | ORAL_TABLET | Freq: Every day | ORAL | Status: DC
  Administered 2020-01-12 – 2020-01-17 (×6): 1 via ORAL
  Filled 2020-01-12 (×7): qty 1

## 2020-01-12 MED ORDER — ALBUTEROL SULFATE (2.5 MG/3ML) 0.083% IN NEBU
2.5000 mg | INHALATION_SOLUTION | Freq: Four times a day (QID) | RESPIRATORY_TRACT | Status: DC
  Administered 2020-01-12 – 2020-01-14 (×6): 2.5 mg via RESPIRATORY_TRACT
  Filled 2020-01-12 (×6): qty 3

## 2020-01-12 MED ORDER — ONDANSETRON HCL 4 MG PO TABS: 4.0000 mg | ORAL_TABLET | Freq: Four times a day (QID) | ORAL | Status: DC | PRN

## 2020-01-12 MED ORDER — ARFORMOTEROL TARTRATE 15 MCG/2ML IN NEBU
15.0000 ug | INHALATION_SOLUTION | Freq: Two times a day (BID) | RESPIRATORY_TRACT | Status: DC
  Administered 2020-01-12 – 2020-01-17 (×9): 15 ug via RESPIRATORY_TRACT
  Filled 2020-01-12 (×12): qty 2

## 2020-01-12 MED ORDER — IPRATROPIUM BROMIDE 0.02 % IN SOLN
0.5000 mg | Freq: Four times a day (QID) | RESPIRATORY_TRACT | Status: DC
  Administered 2020-01-12 – 2020-01-14 (×6): 0.5 mg via RESPIRATORY_TRACT
  Filled 2020-01-12 (×6): qty 2.5

## 2020-01-12 NOTE — ED Notes (Signed)
Pt remains in MRI at this time  

## 2020-01-12 NOTE — ED Notes (Signed)
Admitting at bedside 

## 2020-01-12 NOTE — H&P (Signed)
History and Physical  Rebecca [name] PZW:[id number] DOB: 08-25-1940 DOA: 01/12/2020  PCP: Farris Has, MD Patient coming from: Home   I have personally briefly reviewed patient's old medical records in [facility]   Chief Complaint: Headaches, tremors, inability to walk, SOB  HPI: Rebecca [name] is a 79 y.o. female with PMH significant for COPD, emphysema on 6 L oxygen at home, HTN, CHF who presents complaining of weakness LE, inability to ambulate due to weakness, tremors LE and upper, headaches. Also report progressive SOB, cough for last two days. She was recently prescribe Z pack, had sinus infection.   Evaluation in the; CO2 at 80 and Ph 7;3 on venous gas. MRI brain negative for stroke.  Patient reported worsening SOB after she came from MRI, Received Multiples nebulizer treatments.     Review of Systems: All systems reviewed and apart from history of presenting illness, are negative.  Past Medical History:  Diagnosis Date  . Congestive heart disease (HCC)   . COPD (chronic obstructive pulmonary disease) (HCC)   . Diabetes (HCC)   . Hyperlipemia   . Osteoporosis   . Primary hypertension    History reviewed. No pertinent surgical history. Social History:  reports that she quit smoking about 18 years ago. Her smoking use included cigarettes. She has a 60.00 pack-year smoking history. She has never used smokeless tobacco. She reports current alcohol use of about 1.0 standard drink of alcohol per week. She reports that she does not use drugs.   Allergies  Allergen Reactions  . Amoxicillin Rash  . Lisinopril Rash  . Sulfacetamide Rash  . Tetracyclines & Related Rash   Family History ;   Prior to Admission medications   Medication Sig Start Date End Date Taking? Authorizing Provider  albuterol (PROVENTIL) (2.5 MG/3ML) 0.083% nebulizer solution PLEASE SEE ATTACHED FOR DETAILED DIRECTIONS 11/17/19   Parrett, Virgel Bouquet, NP  albuterol (VENTOLIN HFA) 108 (90  Base) MCG/ACT inhaler Inhale 1-2 puffs into the lungs every 6 (six) hours as needed for wheezing or shortness of breath. 12/27/19   Oretha Milch, MD  arformoterol (BROVANA) 15 MCG/2ML NEBU Take 2 mLs (15 mcg total) by nebulization 2 (two) times daily. 10/25/19   Oretha Milch, MD  budesonide (PULMICORT) 0.5 MG/2ML nebulizer solution Take 2 mLs (0.5 mg total) by nebulization daily. 10/25/19   Oretha Milch, MD  Calcium Carbonate-Vitamin D (CALCIUM 600+D) 600-200 MG-UNIT TABS Take 1 tablet by mouth daily.     [provider]  carvedilol (COREG) 25 MG tablet Take 25 mg by mouth 2 (two) times daily.  01/21/18   [provider]  estradiol (ESTRACE) 0.1 MG/GM vaginal cream Place 1 Applicatorful vaginally every other day.  09/09/18   [provider]  metFORMIN (GLUCOPHAGE) 500 MG tablet Take 500 mg by mouth daily with supper.  01/21/18   [provider]  pravastatin (PRAVACHOL) 10 MG tablet Take 10 mg by mouth daily.  07/16/18   [provider]   Physical Exam: Vitals:   01/12/20 1717 01/12/20 1845 01/12/20 1900 01/12/20 1915  BP: (!) 171/118 (!) 156/87 (!) 119/107 117/90  Pulse: (!) 103 96 96 94  Resp: (!) 28 (!) 23 15 17   Temp:      TempSrc:      SpO2: 98% 95% 97% 94%     General exam: Moderately built and nourished patient, lying comfortably supine on the gurney in no obvious distress.  Head, eyes and ENT: Nontraumatic and  normocephalic. Pupils equally reacting to light and accommodation. Oral mucosa moist.  Neck: Supple. No JVD, carotid bruit or thyromegaly.  Lymphatics: No lymphadenopathy.  Respiratory system: Clear to auscultation. Mild increase work of breathing, no wheezing, tachypnea.   Cardiovascular system: S1 and S2 heard, RRR. No JVD, murmurs, gallops, clicks or pedal edema.  Gastrointestinal system: Abdomen is nondistended, soft and nontender. Normal bowel sounds heard. No organomegaly or masses appreciated.  Central nervous system:  Alert and oriented. No focal neurological deficits.  Extremities: Symmetric 5 x 5 power. Peripheral pulses symmetrically felt.   Skin: No rashes or acute findings.  Musculoskeletal system: Negative exam.  Psychiatry: Pleasant and cooperative.   Labs on Admission:  Basic Metabolic Panel: Recent Labs  Lab 01/12/20 1134 01/12/20 1256 01/12/20 1320  NA 137  --  135  K 5.9* 5.0 4.9  CL 89*  --   --   CO2 40*  --   --   GLUCOSE 116*  --   --   BUN 14  --   --   CREATININE 0.51  --   --   CALCIUM 9.3  --   --    Liver Function Tests: Recent Labs  Lab 01/12/20 1134  AST 24  ALT 17  ALKPHOS 47  BILITOT 1.0  PROT 6.2*  ALBUMIN 3.5   No results for input(s): LIPASE, AMYLASE in the last 168 hours. Recent Labs  Lab 01/12/20 1256  AMMONIA 19   CBC: Recent Labs  Lab 01/12/20 1134 01/12/20 1320  WBC 6.1  --   NEUTROABS 4.4  --   HGB 12.3 13.3  HCT 41.1 39.0  MCV 103.3*  --   PLT 186  --    Cardiac Enzymes: No results for input(s): CKTOTAL, CKMB, CKMBINDEX, TROPONINI in the last 168 hours.  BNP (last 3 results) No results for input(s): PROBNP in the last 8760 hours. CBG: No results for input(s): GLUCAP in the last 168 hours.  Radiological Exams on Admission: CT Head Wo Contrast  Result Date: 01/12/2020 CLINICAL DATA:  Focal neuro deficit, greater than 6 hours, stroke suspected. Additional provided: Altered mental status since yesterday. Tremors. EXAM: CT HEAD WITHOUT CONTRAST TECHNIQUE: Contiguous axial images were obtained from the base of the skull through the vertex without intravenous contrast. COMPARISON:  No pertinent prior studies available for comparison. FINDINGS: Brain: There is mild generalized parenchymal atrophy. Mild ill-defined hypoattenuation within the cerebral white matter is nonspecific, but consistent with chronic small vessel ischemic disease. There is no acute intracranial hemorrhage. No demarcated cortical infarct is identified. No extra-axial  fluid collection. No evidence of intracranial mass. No midline shift. Vascular: No hyperdense vessel.  Atherosclerotic calcifications. Skull: Normal. Negative for fracture or focal lesion. Sinuses/Orbits: Visualized orbits show no acute finding. Moderate-sized left maxillary sinus mucous retention cyst. Mild ethmoid and right sphenoid sinus mucosal thickening. Right mastoid effusion. IMPRESSION: No CT evidence of acute intracranial abnormality. Mild generalized parenchymal atrophy and chronic small vessel ischemic disease. Mild ethmoid and right sphenoid sinus mucosal thickening. Left maxillary sinus mucous retention cyst. Right mastoid effusion. Electronically Signed   By: Jackey Loge DO   On: 01/12/2020 13:59   CT ANGIO CHEST PE W OR WO CONTRAST  Result Date: 01/12/2020 CLINICAL DATA:  Altered mental status. EXAM: CT ANGIOGRAPHY CHEST WITH CONTRAST TECHNIQUE: Multidetector CT imaging of the chest was performed using the standard protocol during bolus administration of intravenous contrast. Multiplanar CT image reconstructions and MIPs were obtained to evaluate the vascular anatomy. CONTRAST:  55mL OMNIPAQUE IOHEXOL 350 MG/ML SOLN COMPARISON:  September 28, 2019. FINDINGS: Cardiovascular: Satisfactory opacification of the pulmonary arteries to the segmental level. No evidence of pulmonary embolism. Normal heart size. No pericardial effusion. Coronary artery calcifications are noted. Atherosclerosis of thoracic aorta is noted without aneurysm formation. Mediastinum/Nodes: Small sliding-type hiatal hernia is noted. No adenopathy is noted. Thyroid gland is unremarkable. Lungs/Pleura: No pneumothorax or pleural effusion is noted. 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 Mild bilateral posterior basilar subsegmental atelectasis is noted. Upper Abdomen: Bilateral nephrolithiasis is noted. Musculoskeletal: No chest wall abnormality. No acute or significant osseous findings. Review of the MIP images confirms  the above findings. IMPRESSION: 1. No definite evidence of pulmonary embolus. 2. Coronary artery calcifications are noted suggesting coronary artery disease. 3. Small sliding-type hiatal hernia. 4. Bilateral nephrolithiasis. Aortic Atherosclerosis (ICD10-I70.0) and Emphysema (ICD10-J43.9). Electronically Signed   By: Lupita Raider M.D.   On: 01/12/2020 20:15   MR BRAIN WO CONTRAST  Result Date: 01/12/2020 CLINICAL DATA:  Altered mental status beginning yesterday.  Tremors. EXAM: MRI HEAD WITHOUT CONTRAST TECHNIQUE: Multiplanar, multiecho pulse sequences of the brain and surrounding structures were obtained without intravenous contrast. COMPARISON:  Head CT same day FINDINGS: Brain: Diffusion imaging does not show any acute or subacute infarction or other cause of restricted diffusion. Chronic small-vessel ischemic changes affect the pons. No focal cerebellar finding. Cerebral hemispheres show mild chronic small-vessel ischemic change of the white matter, often seen at this age. No cortical or large vessel territory infarction. No mass lesion, hemorrhage, hydrocephalus or extra-axial collection. Vascular: Major vessels at the base of the brain show flow. Skull and upper cervical spine: Negative Sinuses/Orbits: Retention cyst left maxillary sinus. No sign of sinusitis. Orbits negative. Other: Mastoid effusions, more extensive on the right than the left. IMPRESSION: No acute intracranial finding. Age related atrophy. Mild chronic small-vessel ischemic change of the pons and cerebral hemispheric white matter, often seen at this age. Mastoid effusions right more than left. Electronically Signed   By: Paulina Fusi M.D.   On: 01/12/2020 15:59   DG Chest Portable 1 View  Result Date: 01/12/2020 CLINICAL DATA:  Shortness of breath. EXAM: PORTABLE CHEST 1 VIEW COMPARISON:  09/28/2019 FINDINGS: The heart size is within normal limits. Ectasia and atherosclerotic calcification of thoracic aorta remains stable.  Pulmonary emphysema again noted. Both lungs are clear. IMPRESSION: Emphysema. No active disease. Electronically Signed   By: Danae Orleans M.D.   On: 01/12/2020 12:02    EKG: Independently reviewed.   Assessment/Plan Active Problems:   Chronic respiratory failure with hypoxia (HCC)   COPD with chronic bronchitis and emphysema (HCC)   Nonischemic cardiomyopathy (HCC)   COPD exacerbation (HCC)   1-Acute COPD exacerbation; Acute on chronic hypoxic, hypercapnic resp failure Continue brovana, pulmicort.  Schedule nebulizer.  IV solumedrol.  BIPAP PRN.n and HS Pulmonologist consulted.   2-Tremors,  check ammonia, B 12, TSH.  Suspect related to hypercapnia.  Needs PT  3-HTN; resume carvedilol.  PRN hydralazine.  4-Headaches; confusion, tremors, weakness  MRI negative   DVT Prophylaxis: Lovenox Code Status: partial, does not want to be intubated Family Communication: Daughter at bedside.  Disposition Plan: admit under observation for management of COPD  Time spent: 75 minutes    .  Alba Cory MD [facility]   01/12/2020, 8:35 PM

## 2020-01-12 NOTE — ED Notes (Signed)
Pt was cleaned after being incontinent of urine in MRI by this nurse and ED tech. RN instructed MRI to proceed.

## 2020-01-12 NOTE — ED Triage Notes (Signed)
Pt BIB EMS due to AMS that started yesterday. EMS reports pt is having tremors.Pt is on 6L o2 basline but has been having trouble breathing.EMS states stroke screen negative. Pt is A&Ox4.

## 2020-01-12 NOTE — ED Notes (Signed)
Pt transported to CT ?

## 2020-01-12 NOTE — ED Notes (Addendum)
Pt experienced episode of SOB while at MRI.   RN returned phone call to MRI tech. Per tech patient experienced difficulty breathing during scan, worsening while laying flat. Informed pt stated she felt like she couldn't breath and requesting MRI tech to increase oxygen flow. Pt returning to room.   When RN meet patient. Pt was low 80s. Oxygen tank was empty. Pt placed back on oxygen. Pt on home oxygen at 6 L. PA Hina at bedside.  Inhalors atrovent and albuterol given. HOB raised. After interventions, Pt now RR 22, SpO2 96%. Daughter at bedside.

## 2020-01-12 NOTE — Consult Note (Addendum)
NAME:  Rebecca [name], MRN:  [id number], DOB:  [birth year], LOS: 0 ADMISSION DATE:  01/12/2020, CONSULTATION DATE: 01/12/2020 REFERRING MD: Sunnie Nielsen - TRH, CHIEF COMPLAINT: Dyspnea  HPI/course in hospital  79 year old woman with a history of severe COPD on 6 L of oxygen.  Compliant with therapy.  Presented today with vague complaints of tremors unsteadiness on her feet and slurred speech. CT and MRI head were negative.  Progressive increase in shortness of breath.  She is unable to clearly state whether her breathing is significantly worse today from baseline.  She is dyspneic on minimal exertion at baseline.  Denies sick contacts.  Denies increased cough or sputum production.  Denies chest pain.  Denied increased leg swelling to me. Did not spend much time out in the heat.  Has central air-conditioning.   Past Medical History   Past Medical History:  Diagnosis Date   Congestive heart disease (HCC)    COPD (chronic obstructive pulmonary disease) (HCC)    Diabetes (HCC)    Hyperlipemia    Osteoporosis    Primary hypertension     History reviewed. No pertinent surgical history.   Review of Systems:   Review of Systems  HENT: Negative.   Eyes: Negative.   Respiratory: Positive for shortness of breath.   Cardiovascular: Negative.  Negative for chest pain.  Gastrointestinal: Negative.   Genitourinary: Negative.   Musculoskeletal: Negative.   Skin: Negative.   Neurological: Positive for speech change. Negative for dizziness.  Endo/Heme/Allergies: Negative.   Psychiatric/Behavioral: Negative.     Social History   reports that she quit smoking about 18 years ago. Her smoking use included cigarettes. She has a 60.00 pack-year smoking history. She has never used smokeless tobacco. She reports current alcohol use of about 1.0 standard drink of alcohol per week. She reports that she does not use drugs.   Family History   Her family history is not on file.    Allergies Allergies  Allergen Reactions   Amoxicillin Rash   Lisinopril Rash   Sulfacetamide Rash   Tetracyclines & Related Rash     Home Medications  Prior to Admission medications   Medication Sig Start Date End Date Taking? Authorizing Provider  albuterol (PROVENTIL) (2.5 MG/3ML) 0.083% nebulizer solution PLEASE SEE ATTACHED FOR DETAILED DIRECTIONS 11/17/19   Parrett, Virgel Bouquet, NP  albuterol (VENTOLIN HFA) 108 (90 Base) MCG/ACT inhaler Inhale 1-2 puffs into the lungs every 6 (six) hours as needed for wheezing or shortness of breath. 12/27/19   Oretha Milch, MD  arformoterol (BROVANA) 15 MCG/2ML NEBU Take 2 mLs (15 mcg total) by nebulization 2 (two) times daily. 10/25/19   Oretha Milch, MD  budesonide (PULMICORT) 0.5 MG/2ML nebulizer solution Take 2 mLs (0.5 mg total) by nebulization daily. 10/25/19   Oretha Milch, MD  Calcium Carbonate-Vitamin D (CALCIUM 600+D) 600-200 MG-UNIT TABS Take 1 tablet by mouth daily.     [provider]  carvedilol (COREG) 25 MG tablet Take 25 mg by mouth 2 (two) times daily.  01/21/18   [provider]  estradiol (ESTRACE) 0.1 MG/GM vaginal cream Place 1 Applicatorful vaginally every other day.  09/09/18   [provider]  metFORMIN (GLUCOPHAGE) 500 MG tablet Take 500 mg by mouth daily with supper.  01/21/18   [provider]  pravastatin (PRAVACHOL) 10 MG tablet Take 10 mg by mouth daily.  07/16/18   [provider]     Interim history/subjective:  Confusion and jerkiness has stopped.  Still short of breath.  Unable to quantify whether improved or not.  Objective   Blood pressure (!) 171/118, pulse (!) 103, temperature 99 F (37.2 C), temperature source Rectal, resp. rate (!) 28, SpO2 98 %.       No intake or output data in the 24 hours ending 01/12/20 1832 There were no vitals filed for this visit.  Examination: Physical Exam Constitutional:      Appearance: She is obese.  HENT:     Head:  Normocephalic and atraumatic.     Mouth/Throat:     Mouth: Mucous membranes are moist.  Eyes:     Conjunctiva/sclera: Conjunctivae normal.     Pupils: Pupils are equal, round, and reactive to light.  Neck:     Thyroid: No thyromegaly.     Vascular: No JVD.  Cardiovascular:     Rate and Rhythm: Normal rate and regular rhythm.     Pulses: Normal pulses.     Heart sounds: Normal heart sounds.  Pulmonary:     Effort: Tachypnea and accessory muscle usage present.     Breath sounds: Decreased air movement present. Examination of the right-lower field reveals wheezing. Examination of the left-lower field reveals wheezing. Decreased breath sounds and wheezing present.     Comments: Tracheal tug.  Signs of hyperinflation.  Positive Hoover sign. Musculoskeletal:     Right lower leg: 1+ Pitting Edema present.     Left lower leg: 1+ Pitting Edema present.  Lymphadenopathy:     Cervical: No cervical adenopathy.  Neurological:     Mental Status: She is alert.      Ancillary tests (personally reviewed)  CBC: Recent Labs  Lab 01/12/20 1134 01/12/20 1320  WBC 6.1  --   NEUTROABS 4.4  --   HGB 12.3 13.3  HCT 41.1 39.0  MCV 103.3*  --   PLT 186  --     Basic Metabolic Panel: Recent Labs  Lab 01/12/20 1134 01/12/20 1256 01/12/20 1320  NA 137  --  135  K 5.9* 5.0 4.9  CL 89*  --   --   CO2 40*  --   --   GLUCOSE 116*  --   --   BUN 14  --   --   CREATININE 0.51  --   --   CALCIUM 9.3  --   --    GFR: CrCl cannot be calculated (Unknown ideal weight.). Recent Labs  Lab 01/12/20 1134  WBC 6.1    Liver Function Tests: Recent Labs  Lab 01/12/20 1134  AST 24  ALT 17  ALKPHOS 47  BILITOT 1.0  PROT 6.2*  ALBUMIN 3.5   No results for input(s): LIPASE, AMYLASE in the last 168 hours. Recent Labs  Lab 01/12/20 1256  AMMONIA 19    ABG    Component Value Date/Time   HCO3 50.2 (H) 01/12/2020 1320   TCO2 >50 (H) 01/12/2020 1320   O2SAT 86.0 01/12/2020 1320      Coagulation Profile: Recent Labs  Lab 01/12/20 1134  INR 0.9    Cardiac Enzymes: No results for input(s): CKTOTAL, CKMB, CKMBINDEX, TROPONINI in the last 168 hours.  HbA1C: No results found for: HGBA1C  CBG: No results for input(s): GLUCAP in the last 168 hours.   Assessment & Plan:   Vague constellation of symptoms without clear signs of COPD exacerbation.  Very poor pulmonary reserve at baseline so may be difficult to distinguish any real worsening. Currently she is saturating well on her  home settings.  She is not more visibly dyspneic.  Would recommend continuing her on baseline COPD medication. Will obtain CT PE to rule out pulmonary embolism as potential cause for increased shortness of breath and general weakness.  If CT PE negative, would try short course of steroids 40 mg prednisone daily for 5 days. If it is positive then we will treat for pulmonary embolism with IV heparin and obtain echocardiogram.  Alternatively, worsening hypercarbia may cause tremulousness and confusion although she is currently well compensated, suggesting that this is not an acute development but rather a sign of progressive COPD with hypercapnia over time. Initiation of nocturnal BiPAP will progressively correct her hypercarbia and may clear her mentation as well as her tremors.  We will follow with you.  Lynnell Catalan, MD [facility] ICU Physician [facility] [place] [facility]  Pager: [phone] Mobile: [phone] After hours: [phone].  01/12/2020, 6:32 PM

## 2020-01-12 NOTE — ED Notes (Signed)
Pt cleaned, new sheets, new gown, and new purwick provided. Pt required assistance when turning in bed, too weak to push self up in bed.

## 2020-01-12 NOTE — ED Provider Notes (Signed)
[facility] EMERGENCY DEPARTMENT Provider Note   CSN: [id number] Arrival date & time: 01/12/20  1116     History Chief Complaint  Patient presents with  . Altered Mental Status    Rebecca [name] is a 79 y.o. female with a past medical history of COPD, CHF hypertension, nonischemic cardiomyopathy presenting to the ED with multiple complaints. First complaint is tremors and slurred speech.  States that she went to bed last night in her usual state of health.  This morning woke up and felt like "I was talking funny" and feels that she is tremulous and "having jerking movements."  Denies history of similar symptoms in the past.  She reports having a headache yesterday, tried taking some Tylenol and went to sleep.  However she continues to have a headache today.  Denies any head injury or loss of consciousness.  Denies any blurry vision different from her baseline.  Denies any numbness in arms or legs, vomiting, abdominal pain, chest pain or history of stroke. Her second complaint is shortness of breath.  She has noticed for the past several days that she is having worsening shortness of breath related to her COPD.  States that recently her PCP told her to increase her oxygen to 6 L by nasal cannula instead of 4.  She is also been using her nebulizer machine with only minimal improvement in her shortness of breath.  She denies any specific cough or fever, sick contacts with similar symptoms.  States that she has leg swelling intermittently.  Has been taking her home medications as previously prescribed.  HPI     Past Medical History:  Diagnosis Date  . Congestive heart disease (HCC)   . COPD (chronic obstructive pulmonary disease) (HCC)   . Diabetes (HCC)   . Hyperlipemia   . Osteoporosis   . Primary hypertension     Patient Active Problem List   Diagnosis Date Noted  . COPD exacerbation (HCC) 01/12/2020  . Nonischemic cardiomyopathy (HCC) 07/02/2018  . Lung  nodule 06/18/2018  . Chronic respiratory failure with hypoxia (HCC) 02/05/2018  . COPD with chronic bronchitis and emphysema (HCC) 02/05/2018    History reviewed. No pertinent surgical history.   OB History   No obstetric history on file.     History reviewed. No pertinent family history.  Social History   Tobacco Use  . Smoking status: Former Smoker    Packs/day: 1.50    Years: 40.00    Pack years: 60.00    Types: Cigarettes    Quit date: 2003    Years since quitting: 18.5  . Smokeless tobacco: Never Used  Vaping Use  . Vaping Use: Never used  Substance Use Topics  . Alcohol use: Yes    Alcohol/week: 1.0 standard drink    Types: 1 Glasses of wine per week  . Drug use: Never    Home Medications Prior to Admission medications   Medication Sig Start Date End Date Taking? Authorizing Provider  albuterol (PROVENTIL) (2.5 MG/3ML) 0.083% nebulizer solution PLEASE SEE ATTACHED FOR DETAILED DIRECTIONS 11/17/19   Parrett, Virgel Bouquet, NP  albuterol (VENTOLIN HFA) 108 (90 Base) MCG/ACT inhaler Inhale 1-2 puffs into the lungs every 6 (six) hours as needed for wheezing or shortness of breath. 12/27/19   Oretha Milch, MD  arformoterol (BROVANA) 15 MCG/2ML NEBU Take 2 mLs (15 mcg total) by nebulization 2 (two) times daily. 10/25/19   Oretha Milch, MD  budesonide (PULMICORT) 0.5 MG/2ML nebulizer solution Take  2 mLs (0.5 mg total) by nebulization daily. 10/25/19   Oretha Milch, MD  Calcium Carbonate-Vitamin D (CALCIUM 600+D) 600-200 MG-UNIT TABS Take 1 tablet by mouth daily.     [provider]  carvedilol (COREG) 25 MG tablet Take 25 mg by mouth 2 (two) times daily.  01/21/18   [provider]  estradiol (ESTRACE) 0.1 MG/GM vaginal cream Place 1 Applicatorful vaginally every other day.  09/09/18   [provider]  metFORMIN (GLUCOPHAGE) 500 MG tablet Take 500 mg by mouth daily with supper.  01/21/18   [provider]  pravastatin (PRAVACHOL) 10 MG tablet  Take 10 mg by mouth daily.  07/16/18   [provider]    Allergies    Amoxicillin, Lisinopril, Sulfacetamide, and Tetracyclines & related  Review of Systems   Review of Systems  Constitutional: Negative for appetite change, chills and fever.  HENT: Negative for ear pain, rhinorrhea, sneezing and sore throat.   Eyes: Negative for photophobia and visual disturbance.  Respiratory: Positive for chest tightness and shortness of breath. Negative for cough and wheezing.   Cardiovascular: Negative for chest pain and palpitations.  Gastrointestinal: Negative for abdominal pain, blood in stool, constipation, diarrhea, nausea and vomiting.  Genitourinary: Negative for dysuria, hematuria and urgency.  Musculoskeletal: Negative for myalgias.  Skin: Negative for rash.  Neurological: Positive for tremors, speech difficulty and headaches. Negative for dizziness, weakness, light-headedness and numbness.    Physical Exam Updated Vital Signs BP (!) 171/118   Pulse (!) 103   Temp 99 F (37.2 C) (Rectal)   Resp (!) 28   SpO2 98%   Physical Exam Vitals and nursing note reviewed.  Constitutional:      General: She is not in acute distress.    Appearance: She is well-developed.     Comments: On 4 L of oxygen via nasal cannula.  Speaking short sentences.  HENT:     Head: Normocephalic and atraumatic.     Nose: Nose normal.  Eyes:     General: No scleral icterus.       Right eye: No discharge.        Left eye: No discharge.     Conjunctiva/sclera: Conjunctivae normal.     Pupils: Pupils are equal, round, and reactive to light.  Cardiovascular:     Rate and Rhythm: Normal rate and regular rhythm.     Heart sounds: Normal heart sounds. No murmur heard.  No friction rub. No gallop.   Pulmonary:     Effort: Pulmonary effort is normal. Tachypnea present. No respiratory distress.     Breath sounds: Normal breath sounds.     Comments: Diminished breath sounds bilaterally. Abdominal:      General: Bowel sounds are normal. There is no distension.     Palpations: Abdomen is soft.     Tenderness: There is no abdominal tenderness. There is no guarding.  Musculoskeletal:        General: Normal range of motion.     Cervical back: Normal range of motion and neck supple.  Skin:    General: Skin is warm and dry.     Findings: No rash.  Neurological:     Mental Status: She is alert and oriented to person, place, and time.     Cranial Nerves: Cranial nerve deficit present.     Sensory: Sensory deficit present.     Motor: No weakness or abnormal muscle tone.     Coordination: Coordination normal.  Comments: Alert, oriented to self, place, time and situation. PERRL.  Strength 5/5 in bilateral upper and lower extremities.  No tremor noted.  Normal finger-to-nose coordination bilaterally.  Patient reports decrease sensation throughout her face bilaterally. Question facial droop noted on R side.  Sensation intact to light touch of bilateral upper and lower extremities.     ED Results / Procedures / Treatments   Labs (all labs ordered are listed, but only abnormal results are displayed) Labs Reviewed  COMPREHENSIVE METABOLIC PANEL - Abnormal; Notable for the following components:      Result Value   Potassium 5.9 (*)    Chloride 89 (*)    CO2 40 (*)    Glucose, Bld 116 (*)    Total Protein 6.2 (*)    All other components within normal limits  CBC WITH DIFFERENTIAL/PLATELET - Abnormal; Notable for the following components:   MCV 103.3 (*)    MCHC 29.9 (*)    All other components within normal limits  URINALYSIS, ROUTINE W REFLEX MICROSCOPIC - Abnormal; Notable for the following components:   APPearance HAZY (*)    Hgb urine dipstick SMALL (*)    Ketones, ur 5 (*)    Bacteria, UA MANY (*)    All other components within normal limits  I-STAT VENOUS BLOOD GAS, ED - Abnormal; Notable for the following components:   pCO2, Ven 82.5 (*)    pO2, Ven 57.0 (*)    Bicarbonate 50.2  (*)    TCO2 >50 (*)    Acid-Base Excess 21.0 (*)    Calcium, Ion 1.13 (*)    All other components within normal limits  URINE CULTURE  BRAIN NATRIURETIC PEPTIDE  APTT  PROTIME-INR  AMMONIA  POTASSIUM  VITAMIN B12  AMMONIA    EKG EKG Interpretation  Date/Time:  Wednesday January 12 2020 11:55:52 EDT Ventricular Rate:  85 PR Interval:    QRS Duration: 140 QT Interval:  439 QTC Calculation: 523 R Axis:   34 Text Interpretation: Sinus rhythm LVH with secondary repolarization abnormality Anterior Q waves, possibly due to LVH Prolonged QT interval Similar repolarization abnormality to previous ECG Confirmed by Alvira Monday ([id number]) on 01/12/2020 12:06:26 PM   Radiology CT Head Wo Contrast  Result Date: 01/12/2020 CLINICAL DATA:  Focal neuro deficit, greater than 6 hours, stroke suspected. Additional provided: Altered mental status since yesterday. Tremors. EXAM: CT HEAD WITHOUT CONTRAST TECHNIQUE: Contiguous axial images were obtained from the base of the skull through the vertex without intravenous contrast. COMPARISON:  No pertinent prior studies available for comparison. FINDINGS: Brain: There is mild generalized parenchymal atrophy. Mild ill-defined hypoattenuation within the cerebral white matter is nonspecific, but consistent with chronic small vessel ischemic disease. There is no acute intracranial hemorrhage. No demarcated cortical infarct is identified. No extra-axial fluid collection. No evidence of intracranial mass. No midline shift. Vascular: No hyperdense vessel.  Atherosclerotic calcifications. Skull: Normal. Negative for fracture or focal lesion. Sinuses/Orbits: Visualized orbits show no acute finding. Moderate-sized left maxillary sinus mucous retention cyst. Mild ethmoid and right sphenoid sinus mucosal thickening. Right mastoid effusion. IMPRESSION: No CT evidence of acute intracranial abnormality. Mild generalized parenchymal atrophy and chronic small vessel ischemic  disease. Mild ethmoid and right sphenoid sinus mucosal thickening. Left maxillary sinus mucous retention cyst. Right mastoid effusion. Electronically Signed   By: Jackey Loge DO   On: 01/12/2020 13:59   MR BRAIN WO CONTRAST  Result Date: 01/12/2020 CLINICAL DATA:  Altered mental status beginning yesterday.  Tremors. EXAM: MRI  HEAD WITHOUT CONTRAST TECHNIQUE: Multiplanar, multiecho pulse sequences of the brain and surrounding structures were obtained without intravenous contrast. COMPARISON:  Head CT same day FINDINGS: Brain: Diffusion imaging does not show any acute or subacute infarction or other cause of restricted diffusion. Chronic small-vessel ischemic changes affect the pons. No focal cerebellar finding. Cerebral hemispheres show mild chronic small-vessel ischemic change of the white matter, often seen at this age. No cortical or large vessel territory infarction. No mass lesion, hemorrhage, hydrocephalus or extra-axial collection. Vascular: Major vessels at the base of the brain show flow. Skull and upper cervical spine: Negative Sinuses/Orbits: Retention cyst left maxillary sinus. No sign of sinusitis. Orbits negative. Other: Mastoid effusions, more extensive on the right than the left. IMPRESSION: No acute intracranial finding. Age related atrophy. Mild chronic small-vessel ischemic change of the pons and cerebral hemispheric white matter, often seen at this age. Mastoid effusions right more than left. Electronically Signed   By: Paulina Fusi M.D.   On: 01/12/2020 15:59   DG Chest Portable 1 View  Result Date: 01/12/2020 CLINICAL DATA:  Shortness of breath. EXAM: PORTABLE CHEST 1 VIEW COMPARISON:  09/28/2019 FINDINGS: The heart size is within normal limits. Ectasia and atherosclerotic calcification of thoracic aorta remains stable. Pulmonary emphysema again noted. Both lungs are clear. IMPRESSION: Emphysema. No active disease. Electronically Signed   By: Danae Orleans M.D.   On: 01/12/2020 12:02     Procedures Procedures (including critical care time)  Medications Ordered in ED Medications  carvedilol (COREG) tablet 25 mg (has no administration in time range)  arformoterol (BROVANA) nebulizer solution 15 mcg (has no administration in time range)  budesonide (PULMICORT) nebulizer solution 0.5 mg (0.5 mg Nebulization Not Given 01/12/20 1754)  albuterol (PROVENTIL) (2.5 MG/3ML) 0.083% nebulizer solution 2.5 mg (has no administration in time range)  ipratropium (ATROVENT) nebulizer solution 0.5 mg (has no administration in time range)  hydrALAZINE (APRESOLINE) tablet 10 mg (has no administration in time range)  methylPREDNISolone sodium succinate (SOLU-MEDROL) 125 mg/2 mL injection 125 mg (125 mg Intravenous Given 01/12/20 1223)  albuterol (VENTOLIN HFA) 108 (90 Base) MCG/ACT inhaler 4 puff (4 puffs Inhalation Given 01/12/20 1225)  ipratropium (ATROVENT HFA) inhaler 3 puff (3 puffs Inhalation Given 01/12/20 1621)  albuterol (VENTOLIN HFA) 108 (90 Base) MCG/ACT inhaler 3 puff (3 puffs Inhalation Given 01/12/20 1623)    ED Course  I have reviewed the triage vital signs and the nursing notes.  Pertinent labs & imaging results that were available during my care of the patient were reviewed by me and considered in my medical decision making (see chart for details).  Clinical Course as of Jan 11 1799  Wed Jan 12, 2020  1220 Potassium(!): 5.9 [HK]  1732 On recheck  Potassium: 5.0 [HK]    Clinical Course User Index [HK] Dietrich Pates, PA-C   MDM Rules/Calculators/A&P                          79 year old female with past medical history of COPD, CHF, hypertension presenting to the ED with multiple complaints. #Complaints of tremors and slurred speech since she woke up this morning.  Reports "jerking movements" and feeling tremulous.  Had a headache since yesterday and took Tylenol but states he continues to have headache today.  Denies any head injury, loss of consciousness or blurry  vision.  Also complains of shortness of breath.  Symptoms have been worsening for the past several days and she had minimal  improvement noted with her home nebulizer treatments.  She has a history of COPD and wears 6 L of oxygen at baseline.  On exam she has diminished breath sounds globally.  States that her chest feels tight.  She is currently on 4 L of oxygen by nasal cannula.  Neurological exam shows possible left-sided facial droop but patient denies any sensation of this.  She reports numbness throughout her entire face bilaterally.  No weakness or numbness noted to upper or lower extremities.  Normal finger-to-nose coordination bilaterally.  Work-up here including CBC, CMP unremarkable.  Urinalysis showing many bacteria and many squamous cells.  VBG shows increasing PCO2.  X-ray shows emphysema without any active disease.  EKG shows sinus rhythm, similar to prior EKGs.  MRI of the brain CT of the head shows no acute findings or signs of CVA.  She continues to be short of breath despite Solu-Medrol and albuterol and ipratropium given here.  She is now requiring 6 L of oxygen.  Feel that she will benefit admission for COPD exacerbation.  Patient states that she does not feel comfortable going home she does not feel that she can manage her symptoms at home.  Will admit to hospitalist service. Patient discussed with and seen by Dr. Dalene Seltzer.   Portions of this note were generated with Scientist, clinical (histocompatibility and immunogenetics). Dictation errors may occur despite best attempts at proofreading.  Final Clinical Impression(s) / ED Diagnoses Final diagnoses:  Chronic obstructive pulmonary disease with acute exacerbation [facility])    Rx / DC Orders ED Discharge Orders    None       Dietrich Pates, PA-C 01/12/20 1800    Alvira Monday, MD 01/13/20 2124

## 2020-01-12 NOTE — ED Notes (Addendum)
Unable to access pt. Pt currently in MRI.

## 2020-01-13 DIAGNOSIS — E785 Hyperlipidemia, unspecified: Secondary | ICD-10-CM | POA: Diagnosis present

## 2020-01-13 DIAGNOSIS — J9621 Acute and chronic respiratory failure with hypoxia: Secondary | ICD-10-CM | POA: Diagnosis present

## 2020-01-13 DIAGNOSIS — J9601 Acute respiratory failure with hypoxia: Secondary | ICD-10-CM | POA: Diagnosis not present

## 2020-01-13 DIAGNOSIS — I509 Heart failure, unspecified: Secondary | ICD-10-CM | POA: Diagnosis not present

## 2020-01-13 DIAGNOSIS — Z7189 Other specified counseling: Secondary | ICD-10-CM | POA: Diagnosis not present

## 2020-01-13 DIAGNOSIS — Z7984 Long term (current) use of oral hypoglycemic drugs: Secondary | ICD-10-CM | POA: Diagnosis not present

## 2020-01-13 DIAGNOSIS — Z87891 Personal history of nicotine dependence: Secondary | ICD-10-CM | POA: Diagnosis not present

## 2020-01-13 DIAGNOSIS — Z7951 Long term (current) use of inhaled steroids: Secondary | ICD-10-CM | POA: Diagnosis not present

## 2020-01-13 DIAGNOSIS — R0602 Shortness of breath: Secondary | ICD-10-CM | POA: Diagnosis not present

## 2020-01-13 DIAGNOSIS — R531 Weakness: Secondary | ICD-10-CM | POA: Diagnosis not present

## 2020-01-13 DIAGNOSIS — J9622 Acute and chronic respiratory failure with hypercapnia: Secondary | ICD-10-CM | POA: Diagnosis present

## 2020-01-13 DIAGNOSIS — E119 Type 2 diabetes mellitus without complications: Secondary | ICD-10-CM | POA: Diagnosis present

## 2020-01-13 DIAGNOSIS — J9611 Chronic respiratory failure with hypoxia: Secondary | ICD-10-CM

## 2020-01-13 DIAGNOSIS — R4781 Slurred speech: Secondary | ICD-10-CM | POA: Diagnosis present

## 2020-01-13 DIAGNOSIS — Z66 Do not resuscitate: Secondary | ICD-10-CM | POA: Diagnosis present

## 2020-01-13 DIAGNOSIS — K5900 Constipation, unspecified: Secondary | ICD-10-CM | POA: Diagnosis not present

## 2020-01-13 DIAGNOSIS — R519 Headache, unspecified: Secondary | ICD-10-CM | POA: Diagnosis present

## 2020-01-13 DIAGNOSIS — Z881 Allergy status to other antibiotic agents status: Secondary | ICD-10-CM | POA: Diagnosis not present

## 2020-01-13 DIAGNOSIS — E538 Deficiency of other specified B group vitamins: Secondary | ICD-10-CM | POA: Diagnosis present

## 2020-01-13 DIAGNOSIS — Z79899 Other long term (current) drug therapy: Secondary | ICD-10-CM | POA: Diagnosis not present

## 2020-01-13 DIAGNOSIS — Z9981 Dependence on supplemental oxygen: Secondary | ICD-10-CM | POA: Diagnosis not present

## 2020-01-13 DIAGNOSIS — M810 Age-related osteoporosis without current pathological fracture: Secondary | ICD-10-CM | POA: Diagnosis present

## 2020-01-13 DIAGNOSIS — G9341 Metabolic encephalopathy: Secondary | ICD-10-CM | POA: Diagnosis present

## 2020-01-13 DIAGNOSIS — J449 Chronic obstructive pulmonary disease, unspecified: Secondary | ICD-10-CM | POA: Diagnosis not present

## 2020-01-13 DIAGNOSIS — Z20822 Contact with and (suspected) exposure to covid-19: Secondary | ICD-10-CM | POA: Diagnosis present

## 2020-01-13 DIAGNOSIS — J441 Chronic obstructive pulmonary disease with (acute) exacerbation: Secondary | ICD-10-CM | POA: Diagnosis present

## 2020-01-13 DIAGNOSIS — I428 Other cardiomyopathies: Secondary | ICD-10-CM | POA: Diagnosis present

## 2020-01-13 DIAGNOSIS — Z515 Encounter for palliative care: Secondary | ICD-10-CM | POA: Diagnosis not present

## 2020-01-13 DIAGNOSIS — I110 Hypertensive heart disease with heart failure: Secondary | ICD-10-CM | POA: Diagnosis present

## 2020-01-13 DIAGNOSIS — Z888 Allergy status to other drugs, medicaments and biological substances status: Secondary | ICD-10-CM | POA: Diagnosis not present

## 2020-01-13 LAB — COMPREHENSIVE METABOLIC PANEL
ALT: 22 U/L (ref 0–44)
AST: 26 U/L (ref 15–41)
Albumin: 3.7 g/dL (ref 3.5–5.0)
Alkaline Phosphatase: 49 U/L (ref 38–126)
Anion gap: 13 (ref 5–15)
BUN: 16 mg/dL (ref 8–23)
CO2: 36 mmol/L — ABNORMAL HIGH (ref 22–32)
Calcium: 9.6 mg/dL (ref 8.9–10.3)
Chloride: 86 mmol/L — ABNORMAL LOW (ref 98–111)
Creatinine, Ser: 0.68 mg/dL (ref 0.44–1.00)
GFR calc Af Amer: >60 mL/min (ref >60)
GFR calc non Af Amer: >60 mL/min (ref >60)
Glucose, Bld: 147 mg/dL — ABNORMAL HIGH (ref 70–99)
Potassium: 4.2 mmol/L (ref 3.5–5.1)
Sodium: 135 mmol/L (ref 135–145)
Total Bilirubin: 1.1 mg/dL (ref 0.3–1.2)
Total Protein: 6.5 g/dL (ref 6.5–8.1)

## 2020-01-13 LAB — I-STAT ARTERIAL BLOOD GAS, ED
Acid-Base Excess: 19 mmol/L — ABNORMAL HIGH (ref 0.0–2.0)
Bicarbonate: 43.9 mmol/L — ABNORMAL HIGH (ref 20.0–28.0)
Calcium, Ion: 1.15 mmol/L (ref 1.15–1.40)
HCT: 43 % (ref 36.0–46.0)
Hemoglobin: 14.6 g/dL (ref 12.0–15.0)
O2 Saturation: 97 %
Patient temperature: 98.6
Potassium: 3.9 mmol/L (ref 3.5–5.1)
Sodium: 131 mmol/L — ABNORMAL LOW (ref 135–145)
TCO2: 45 mmol/L — ABNORMAL HIGH (ref 22–32)
pCO2 arterial: 46.3 mmHg (ref 32.0–48.0)
pH, Arterial: 7.585 — ABNORMAL HIGH (ref 7.350–7.450)
pO2, Arterial: 77 mmHg — ABNORMAL LOW (ref 83.0–108.0)

## 2020-01-13 LAB — CBC
HCT: 40.7 % (ref 36.0–46.0)
Hemoglobin: 12.4 g/dL (ref 12.0–15.0)
MCH: 30 pg (ref 26.0–34.0)
MCHC: 30.5 g/dL (ref 30.0–36.0)
MCV: 98.3 fL (ref 80.0–100.0)
Platelets: 192 10*3/uL (ref 150–400)
RBC: 4.14 MIL/uL (ref 3.87–5.11)
RDW: 12.4 % (ref 11.5–15.5)
WBC: 6.2 10*3/uL (ref 4.0–10.5)
nRBC: 0 % (ref 0.0–0.2)

## 2020-01-13 LAB — TSH: TSH: 0.661 u[IU]/mL (ref 0.350–4.500)

## 2020-01-13 LAB — AMMONIA: Ammonia: 12 umol/L (ref 9–35)

## 2020-01-13 LAB — MAGNESIUM: Magnesium: 1.7 mg/dL (ref 1.7–2.4)

## 2020-01-13 LAB — VITAMIN B12: Vitamin B-12: 169 pg/mL — ABNORMAL LOW (ref 180–914)

## 2020-01-13 MED ORDER — SODIUM CHLORIDE 0.9 % IV SOLN
100.0000 mg | Freq: Two times a day (BID) | INTRAVENOUS | Status: DC
  Administered 2020-01-13 – 2020-01-15 (×5): 100 mg via INTRAVENOUS
  Filled 2020-01-13 (×6): qty 100

## 2020-01-13 MED ORDER — ALBUTEROL SULFATE (2.5 MG/3ML) 0.083% IN NEBU: 2.5000 mg | INHALATION_SOLUTION | Freq: Four times a day (QID) | RESPIRATORY_TRACT | Status: DC | PRN

## 2020-01-13 MED ORDER — CYANOCOBALAMIN 1000 MCG/ML IJ SOLN
1000.0000 ug | Freq: Every day | INTRAMUSCULAR | Status: DC
  Administered 2020-01-13 – 2020-01-17 (×5): 1000 ug via INTRAMUSCULAR
  Filled 2020-01-13 (×5): qty 1

## 2020-01-13 MED ORDER — PANTOPRAZOLE SODIUM 40 MG PO TBEC
40.0000 mg | DELAYED_RELEASE_TABLET | Freq: Every day | ORAL | Status: DC
  Administered 2020-01-13 – 2020-01-17 (×5): 40 mg via ORAL
  Filled 2020-01-13 (×5): qty 1

## 2020-01-13 NOTE — ED Notes (Signed)
Report given to 2 west

## 2020-01-13 NOTE — Progress Notes (Signed)
NAME:  Rebecca [name], MRN:  [id number], DOB:  [birth year], LOS: 0 ADMISSION DATE:  01/12/2020, CONSULTATION DATE:  01/12/20 REFERRING MD:  Sunnie Nielsen - TR[facility], CHIEF COMPLAINT:  *SOB  Brief History   79 yo F with severe COPD followed by Dr. Vassie Loll. Baseline 6LNC. Presents with SOB. Admitted to [facility]   History of present illness   79 year old woman with a history of severe COPD on 6 L of oxygen.  Compliant with therapy.  Presented today with vague complaints of tremors unsteadiness on her feet and slurred speech. CT and MRI head were negative.  Progressive increase in shortness of breath.  She is unable to clearly state whether her breathing is significantly worse today from baseline.  She is dyspneic on minimal exertion at baseline.  Denies sick contacts.  Denies increased cough or sputum production.  Denies chest pain.  Denied increased leg swelling to me. Did not spend much time out in the heat.  Has central air-conditioning  Past Medical History  COPD on 6LNC CHF DM HLD OA HTN  Significant Hospital Events   6/30 admitted to [facility], observation . PCCM consulted  Consults:  PCCM  Procedures:    Significant Diagnostic Tests:  6/30 CTA chest> no PE  Micro Data:  6/30 SARS Cov2> neg   Antimicrobials:  Doxy 7/1>  Interim history/subjective:  States she feels "about the same"   Objective   Blood pressure (!) 176/70, pulse (!) 103, temperature 98.9 F (37.2 C), temperature source Oral, resp. rate (!) 29, SpO2 91 %.       No intake or output data in the 24 hours ending 01/13/20 0912 There were no vitals filed for this visit.  Examination: General: Chronically ill appearing older adult F, reclined in ED stretcher NAD  HENT: NCAT. Scant post nasal drip. Pink mmm trachea midline Lungs: CTA but not moving much air, symmetrical chest expansion, no accessory muscle use Cardiovascular: RRR s1s2 no rgm. Cap refill < 3 seconds Abdomen: soft round ndnt Extremities: No obvious  joint deformity. No cyanosis. Trace LE edema Neuro: AAO x4 following commands. PERRL. No tremor.  GU: Defer  Resolved Hospital Problem list     Assessment & Plan:  Vague constellation of symptoms without clear signs of COPD exacerbation.  Very poor pulmonary reserve at baseline so may be difficult to distinguish any real worsening.   Acute on chronic respiratory failure with hypoxia -Severe COPD on 6LNC. Wonder if current presentation reflects worsening of underlying severe disease. No clear features of AECOPD -CTA chest neg for PE Post nasal drip P Short course of steroids to see if helps. Concomitant PPI Nocturnal/PRN BiPAP BDs  IS Doxy, mucinex   Muscle weakness, tremors -- improving tremors.  -may be due to hypercarbia -MRI brain without acute abnormality -ionized calcium WNL  P check mag  Goals of Care -Palliative care has been consulted by [facility]. Patient is open to discussion with PCM for possible further symptom management   Best practice:  Diet: heart healthy Pain/Anxiety/Delirium protocol (if indicated): na VAP protocol (if indicated): na DVT prophylaxis: lovenox GI prophylaxis: protonix  Glucose control: monitor Mobility: PT prior to discharge given pt concerns for weakness  Code Status: Partial - no intubation Family Communication: per primary Disposition: Progressive  Labs   CBC: Recent Labs  Lab 01/12/20 1134 01/12/20 1320 01/13/20 0330 01/13/20 0527  WBC 6.1  --   --  6.2  NEUTROABS 4.4  --   --   --   HGB 12.3  13.3 14.6 12.4  HCT 41.1 39.0 43.0 40.7  MCV 103.3*  --   --  98.3  PLT 186  --   --  192    Basic Metabolic Panel: Recent Labs  Lab 01/12/20 1134 01/12/20 1256 01/12/20 1320 01/13/20 0330 01/13/20 0527  NA 137  --  135 131* 135  K 5.9* 5.0 4.9 3.9 4.2  CL 89*  --   --   --  86*  CO2 40*  --   --   --  36*  GLUCOSE 116*  --   --   --  147*  BUN 14  --   --   --  16  CREATININE 0.51  --   --   --  0.68  CALCIUM 9.3  --    --   --  9.6   GFR: CrCl cannot be calculated (Unknown ideal weight.). Recent Labs  Lab 01/12/20 1134 01/13/20 0527  WBC 6.1 6.2    Liver Function Tests: Recent Labs  Lab 01/12/20 1134 01/13/20 0527  AST 24 26  ALT 17 22  ALKPHOS 47 49  BILITOT 1.0 1.1  PROT 6.2* 6.5  ALBUMIN 3.5 3.7   No results for input(s): LIPASE, AMYLASE in the last 168 hours. Recent Labs  Lab 01/12/20 1256 01/13/20 0527  AMMONIA 19 12    ABG    Component Value Date/Time   PHART 7.585 (H) 01/13/2020 0330   PCO2ART 46.3 01/13/2020 0330   PO2ART 77 (L) 01/13/2020 0330   HCO3 43.9 (H) 01/13/2020 0330   TCO2 45 (H) 01/13/2020 0330   O2SAT 97.0 01/13/2020 0330     Coagulation Profile: Recent Labs  Lab 01/12/20 1134  INR 0.9    Cardiac Enzymes: No results for input(s): CKTOTAL, CKMB, CKMBINDEX, TROPONINI in the last 168 hours.  HbA1C: No results found for: HGBA1C  CBG: No results for input(s): GLUCAP in the last 168 hours.    Tessie Fass MSN, AGACNP-BC [place] [facility] [phone] If no answer, [phone] 01/13/2020, 9:13 AM

## 2020-01-13 NOTE — ED Notes (Signed)
Called to give report, nurse unavailable.  

## 2020-01-13 NOTE — Progress Notes (Signed)
Pt. Refused bipap. Pt. States that she does not wear at home and that she could not tolerate it. RN is aware.

## 2020-01-13 NOTE — ED Notes (Signed)
SDU Breakfast Ordered 

## 2020-01-13 NOTE — Progress Notes (Signed)
Attempted to place cpap, pt states she "can't do it" after about 30 seconds of trying cpap.  I told pt that it can take time to get used to cpap, but pt states she doesn't want to wear it tonight.  VSS currently, HR 90, sat 97%, no distress currently noted.

## 2020-01-13 NOTE — Progress Notes (Signed)
[facility] PROGRESS NOTE    Progress Note  Rebecca [name]  NTZ:[id number] DOB: [birth year] DOA: 01/12/2020 PCP: Farris Has, MD     Brief Narrative:   Rebecca [name] is an 79 y.o. female past medical history significant of for COPD on 6 L of oxygen heart failure comes in complaining of generalized weakness tremor and shortness of breath.  ABG was done this yields a PCO2 of 82  Assessment/Plan:   Acute on Chronic respiratory failure with hypoxia [facility]): It is difficult to distinguish her clinical picture, but her CO2 is 36 which were correct for PCO2 of around 65/66 on admission her arterial ABG shows a PCO2 of 46 which will point to an increase in ventilatory status and falsely decreasing PCO2. She is currently started on IV steroids, inhalers and antibiotics. Pulmonary and critical care were consulted CT angio of the chest that was negative for PE. This point in time we will continue treatment for possible COPD exacerbation, to see if it might help with the symptoms. This could also be a progression of her COPD over time. It would be a good idea to consult palliative care.  Tremors: B12 169 TSH of 0.6 Replete B12 intramuscularly.  Essential hypertension: Continue current home regimen.  Headache/acute encephalopathy: MRI negative question due to hypoxia    DVT prophylaxis: lovenox Family Communication:none Status is: Observation  The patient will require care spanning > 2 midnights and should be moved to inpatient because: Hemodynamically unstable  Dispo: The patient is from: ALF              Anticipated d/c is to: ALF              Anticipated d/c date is: 2 days              Patient currently is not medically stable to d/c.        Code Status:     Code Status Orders  (From admission, onward)         Start     Ordered   01/12/20 2008  Limited resuscitation (code)  Continuous       Question Answer Comment  In the event of cardiac or  respiratory ARREST: Initiate Code Blue, Call Rapid Response Yes   In the event of cardiac or respiratory ARREST: Perform CPR Yes   In the event of cardiac or respiratory ARREST: Perform Intubation/Mechanical Ventilation No   In the event of cardiac or respiratory ARREST: Use NIPPV/BiPAp only if indicated Yes   In the event of cardiac or respiratory ARREST: Administer ACLS medications if indicated Yes   In the event of cardiac or respiratory ARREST: Perform Defibrillation or Cardioversion if indicated Yes      01/12/20 2008        Code Status History    Date Active Date Inactive Code Status Order ID Comments User Context   01/12/2020 1738 01/12/2020 2008 Partial Code [id number]  Alba Cory, MD ED   Advance Care Planning Activity        IV Access:    Peripheral IV   Procedures and diagnostic studies:   CT Head Wo Contrast  Result Date: 01/12/2020 CLINICAL DATA:  Focal neuro deficit, greater than 6 hours, stroke suspected. Additional provided: Altered mental status since yesterday. Tremors. EXAM: CT HEAD WITHOUT CONTRAST TECHNIQUE: Contiguous axial images were obtained from the base of the skull through the vertex without intravenous contrast. COMPARISON:  No pertinent prior studies available for comparison. FINDINGS:  Brain: There is mild generalized parenchymal atrophy. Mild ill-defined hypoattenuation within the cerebral white matter is nonspecific, but consistent with chronic small vessel ischemic disease. There is no acute intracranial hemorrhage. No demarcated cortical infarct is identified. No extra-axial fluid collection. No evidence of intracranial mass. No midline shift. Vascular: No hyperdense vessel.  Atherosclerotic calcifications. Skull: Normal. Negative for fracture or focal lesion. Sinuses/Orbits: Visualized orbits show no acute finding. Moderate-sized left maxillary sinus mucous retention cyst. Mild ethmoid and right sphenoid sinus mucosal thickening. Right mastoid  effusion. IMPRESSION: No CT evidence of acute intracranial abnormality. Mild generalized parenchymal atrophy and chronic small vessel ischemic disease. Mild ethmoid and right sphenoid sinus mucosal thickening. Left maxillary sinus mucous retention cyst. Right mastoid effusion. Electronically Signed   By: Jackey Loge DO   On: 01/12/2020 13:59   CT ANGIO CHEST PE W OR WO CONTRAST  Result Date: 01/12/2020 CLINICAL DATA:  Altered mental status. EXAM: CT ANGIOGRAPHY CHEST WITH CONTRAST TECHNIQUE: Multidetector CT imaging of the chest was performed using the standard protocol during bolus administration of intravenous contrast. Multiplanar CT image reconstructions and MIPs were obtained to evaluate the vascular anatomy. CONTRAST:  27mL OMNIPAQUE IOHEXOL 350 MG/ML SOLN COMPARISON:  September 28, 2019. FINDINGS: Cardiovascular: Satisfactory opacification of the pulmonary arteries to the segmental level. No evidence of pulmonary embolism. Normal heart size. No pericardial effusion. Coronary artery calcifications are noted. Atherosclerosis of thoracic aorta is noted without aneurysm formation. Mediastinum/Nodes: Small sliding-type hiatal hernia is noted. No adenopathy is noted. Thyroid gland is unremarkable. Lungs/Pleura: No pneumothorax or pleural effusion is noted. 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 Mild bilateral posterior basilar subsegmental atelectasis is noted. Upper Abdomen: Bilateral nephrolithiasis is noted. Musculoskeletal: No chest wall abnormality. No acute or significant osseous findings. Review of the MIP images confirms the above findings. IMPRESSION: 1. No definite evidence of pulmonary embolus. 2. Coronary artery calcifications are noted suggesting coronary artery disease. 3. Small sliding-type hiatal hernia. 4. Bilateral nephrolithiasis. Aortic Atherosclerosis (ICD10-I70.0) and Emphysema (ICD10-J43.9). Electronically Signed   By: Lupita Raider M.D.   On: 01/12/2020 20:15   MR BRAIN  WO CONTRAST  Result Date: 01/12/2020 CLINICAL DATA:  Altered mental status beginning yesterday.  Tremors. EXAM: MRI HEAD WITHOUT CONTRAST TECHNIQUE: Multiplanar, multiecho pulse sequences of the brain and surrounding structures were obtained without intravenous contrast. COMPARISON:  Head CT same day FINDINGS: Brain: Diffusion imaging does not show any acute or subacute infarction or other cause of restricted diffusion. Chronic small-vessel ischemic changes affect the pons. No focal cerebellar finding. Cerebral hemispheres show mild chronic small-vessel ischemic change of the white matter, often seen at this age. No cortical or large vessel territory infarction. No mass lesion, hemorrhage, hydrocephalus or extra-axial collection. Vascular: Major vessels at the base of the brain show flow. Skull and upper cervical spine: Negative Sinuses/Orbits: Retention cyst left maxillary sinus. No sign of sinusitis. Orbits negative. Other: Mastoid effusions, more extensive on the right than the left. IMPRESSION: No acute intracranial finding. Age related atrophy. Mild chronic small-vessel ischemic change of the pons and cerebral hemispheric white matter, often seen at this age. Mastoid effusions right more than left. Electronically Signed   By: Paulina Fusi M.D.   On: 01/12/2020 15:59   DG Chest Portable 1 View  Result Date: 01/12/2020 CLINICAL DATA:  Shortness of breath. EXAM: PORTABLE CHEST 1 VIEW COMPARISON:  09/28/2019 FINDINGS: The heart size is within normal limits. Ectasia and atherosclerotic calcification of thoracic aorta remains stable. Pulmonary emphysema again noted. Both  lungs are clear. IMPRESSION: Emphysema. No active disease. Electronically Signed   By: Danae Orleans M.D.   On: 01/12/2020 12:02     Medical Consultants:    None.  Anti-Infectives:  Doxycycline   Subjective:    Rebecca [name] she relates her breathing is not improved.  Objective:    Vitals:   01/13/20 0200 01/13/20  0214 01/13/20 0500 01/13/20 0600  BP: (!) 183/74  (!) 182/82 (!) 169/68  Pulse: 100  99 (!) 103  Resp: (!) 31  (!) 22 (!) 23  Temp:      TempSrc:      SpO2: 99% 100% 94% 97%   SpO2: 97 % O2 Flow Rate (L/min): 6 L/min  No intake or output data in the 24 hours ending 01/13/20 0715 There were no vitals filed for this visit.  Exam: General exam: In no acute distress. Respiratory system: Good air movement and clear to auscultation. Cardiovascular system: S1 & S2 heard, RRR. No JVD. Gastrointestinal system: Abdomen is nondistended, soft and nontender.  Extremities: No pedal edema. Skin: No rashes, lesions or ulcers Psychiatry: Judgement and insight appear normal. Mood & affect appropriate.    Data Reviewed:    Labs: Basic Metabolic Panel: Recent Labs  Lab 01/12/20 1134 01/12/20 1256 01/12/20 1320 01/12/20 1320 01/13/20 0330 01/13/20 0527  NA 137  --  135  --  131* 135  K 5.9*   < > 4.9   < > 3.9 4.2  CL 89*  --   --   --   --  86*  CO2 40*  --   --   --   --  36*  GLUCOSE 116*  --   --   --   --  147*  BUN 14  --   --   --   --  16  CREATININE 0.51  --   --   --   --  0.68  CALCIUM 9.3  --   --   --   --  9.6   < > = values in this interval not displayed.   GFR CrCl cannot be calculated (Unknown ideal weight.). Liver Function Tests: Recent Labs  Lab 01/12/20 1134 01/13/20 0527  AST 24 26  ALT 17 22  ALKPHOS 47 49  BILITOT 1.0 1.1  PROT 6.2* 6.5  ALBUMIN 3.5 3.7   No results for input(s): LIPASE, AMYLASE in the last 168 hours. Recent Labs  Lab 01/12/20 1256 01/13/20 0527  AMMONIA 19 12   Coagulation profile Recent Labs  Lab 01/12/20 1134  INR 0.9   COVID-19 Labs  No results for input(s): DDIMER, FERRITIN, LDH, CRP in the last 72 hours.  Lab Results  Component Value Date   SARSCOV2NAA NEGATIVE 01/12/2020   SARSCOV2NAA NEGATIVE 09/28/2019    CBC: Recent Labs  Lab 01/12/20 1134 01/12/20 1320 01/13/20 0330 01/13/20 0527  WBC 6.1  --   --   6.2  NEUTROABS 4.4  --   --   --   HGB 12.3 13.3 14.6 12.4  HCT 41.1 39.0 43.0 40.7  MCV 103.3*  --   --  98.3  PLT 186  --   --  192   Cardiac Enzymes: No results for input(s): CKTOTAL, CKMB, CKMBINDEX, TROPONINI in the last 168 hours. BNP (last 3 results) No results for input(s): PROBNP in the last 8760 hours. CBG: No results for input(s): GLUCAP in the last 168 hours. D-Dimer: No results for input(s): DDIMER in the last  72 hours. Hgb A1c: No results for input(s): HGBA1C in the last 72 hours. Lipid Profile: No results for input(s): CHOL, HDL, LDLCALC, TRIG, CHOLHDL, LDLDIRECT in the last 72 hours. Thyroid function studies: Recent Labs    01/13/20 0527  TSH 0.661   Anemia work up: Recent Labs    01/13/20 0527  VITAMINB12 169*   Sepsis Labs: Recent Labs  Lab 01/12/20 1134 01/13/20 0527  WBC 6.1 6.2   Microbiology Recent Results (from the past 240 hour(s))  SARS Coronavirus 2 by RT PCR (hospital order, performed in [facility] hospital lab) Nasopharyngeal Nasopharyngeal Swab     Status: None   Collection Time: 01/12/20  9:25 PM   Specimen: Nasopharyngeal Swab  Result Value Ref Range Status   SARS Coronavirus 2 NEGATIVE NEGATIVE Final    Comment: (NOTE) SARS-CoV-2 target nucleic acids are NOT DETECTED.  The SARS-CoV-2 RNA is generally detectable in upper and lower respiratory specimens during the acute phase of infection. The lowest concentration of SARS-CoV-2 viral copies this assay can detect is 250 copies / mL. A negative result does not preclude SARS-CoV-2 infection and should not be used as the sole basis for treatment or other patient management decisions.  A negative result may occur with improper specimen collection / handling, submission of specimen other than nasopharyngeal swab, presence of viral mutation(s) within the areas targeted by this assay, and inadequate number of viral copies (<250 copies / mL). A negative result must be combined with  clinical observations, patient history, and epidemiological information.  Fact Sheet for Patients:   BoilerBrush.com.cy  Fact Sheet for Healthcare Providers: https://pope.com/  This test is not yet approved or  cleared by the [place] FDA and has been authorized for detection and/or diagnosis of SARS-CoV-2 by FDA under an Emergency Use Authorization (EUA).  This EUA will remain in effect (meaning this test can be used) for the duration of the COVID-19 declaration under Section 564(b)(1) of the Act, 21 U.S.C. section 360bbb-3(b)(1), unless the authorization is terminated or revoked sooner.  Performed at [facility], [address], [place], [place] [address]      Medications:   . albuterol  2.5 mg Nebulization Q6H  . arformoterol  15 mcg Nebulization BID  . budesonide  0.5 mg Nebulization Daily  . calcium-vitamin D  1 tablet Oral Daily  . carvedilol  25 mg Oral BID WC  . enoxaparin (LOVENOX) injection  40 mg Subcutaneous Q24H  . guaiFENesin  600 mg Oral BID  . ipratropium  0.5 mg Nebulization Q6H  . methylPREDNISolone (SOLU-MEDROL) injection  40 mg Intravenous Q12H  . pravastatin  10 mg Oral Daily  . sodium chloride flush  3 mL Intravenous Q12H   Continuous Infusions: . sodium chloride        LOS: 0 days   Marinda Elk  [facility]  01/13/2020, 7:15 AM

## 2020-01-13 NOTE — Evaluation (Signed)
Physical Therapy Evaluation Patient Details Name: Rebecca [name] MRN: [id number] DOB: 01-Aug-1940 Today's Date: 01/13/2020   History of Present Illness  Pt is an 79 y.o. female past medical history significant of for COPD on 6 L of oxygen and heart failure. She presented with complaints of comes generalized weakness, tremor, and shortness of breath.  Pt admitted with acute on chronic resp failure with hypoxia.  CT chest negative for PE and MRI head negative for acute changes.  Clinical Impression  Pt admitted with above diagnosis. Pt presenting with decreased activity tolerance, balance, safety, strength, and mobility. She required min guard for standing balance and walking with min cues for transfer technique.  Pt is on 6 LPM O2 at baseline and was able to mobilize today on 6 LPM with stable VS , but reports easily fatigued.  Pt is from independent/assisted living facility and has support and DME needed at d/c. Pt currently with functional limitations due to the deficits listed below (see PT Problem List). Pt will benefit from skilled PT to increase their independence and safety with mobility to allow discharge to the venue listed below.       Follow Up Recommendations Supervision - Intermittent;Home health PT (return to ILF; may need HHPT pending progress)    Equipment Recommendations  None recommended by PT (has DME)    Recommendations for Other Services       Precautions / Restrictions Precautions Precautions: Fall Precaution Comments: monitor O2 Restrictions Weight Bearing Restrictions: No      Mobility  Bed Mobility Overal bed mobility: Needs Assistance Bed Mobility: Sit to Supine       Sit to supine: Supervision   General bed mobility comments: EOB upon arrival  Transfers Overall transfer level: Needs assistance Equipment used: Rolling walker (2 wheeled) Transfers: Sit to/from Stand Sit to Stand: Min guard         General transfer comment: sit to stand x  3  Ambulation/Gait Ambulation/Gait assistance: Min guard Gait Distance (Feet): 80 Feet Assistive device: Rolling walker (2 wheeled) Gait Pattern/deviations: Decreased stride length;Step-through pattern;Shuffle Gait velocity: decreased   General Gait Details: cues for posture and RW proximity  Stairs            Wheelchair Mobility    Modified Rankin (Stroke Patients Only)       Balance Overall balance assessment: Needs assistance Sitting-balance support: No upper extremity supported;Feet supported Sitting balance-Leahy Scale: Good     Standing balance support: No upper extremity supported;During functional activity Standing balance-Leahy Scale: Fair Standing balance comment: Pt able to complete toileting ADLs and washing hands with min guard and no UE support; required RW for walking                             Pertinent Vitals/Pain Pain Assessment: No/denies pain    Home Living Family/patient expects to be discharged to:: Other (Comment) (reports independent living facility) Living Arrangements: Alone Available Help at Discharge: Available PRN/intermittently (staff at facility) Type of Home: Apartment Home Access: Level entry     Home Layout: One level Home Equipment: Walker - 4 wheels;Shower seat;Grab bars - tub/shower;Grab bars - toilet      Prior Function     Gait / Transfers Assistance Needed: Reports could ambulate community distances with Rollator  ADL's / Homemaking Assistance Needed: Reports staff assist with IADLs and meals.  She was independent with ADLs but reports easily fatigued with washing hair  Comments: On  6 LPM O2.     Hand Dominance        Extremity/Trunk Assessment   Upper Extremity Assessment Upper Extremity Assessment: RUE deficits/detail;LUE deficits/detail RUE Deficits / Details: ROM WFL: MMT 4/5 LUE Deficits / Details: ROM WFL: MMT 4/5    Lower Extremity Assessment Lower Extremity Assessment: LLE  deficits/detail;RLE deficits/detail RLE Deficits / Details: ROM WFL: MMT 4+/5 LLE Deficits / Details: ROM WFL: MMT 4+/5    Cervical / Trunk Assessment Cervical / Trunk Assessment: Normal  Communication   Communication: No difficulties  Cognition Arousal/Alertness: Awake/alert Behavior During Therapy: WFL for tasks assessed/performed Overall Cognitive Status: Within Functional Limits for tasks assessed                                        General Comments General comments (skin integrity, edema, etc.): Pt on 6 LPM O2 with sats 93% rest, 90% mobility.    Exercises     Assessment/Plan    PT Assessment Patient needs continued PT services  PT Problem List Decreased strength;Decreased mobility;Decreased activity tolerance;Cardiopulmonary status limiting activity;Decreased balance;Decreased knowledge of use of DME       PT Treatment Interventions Therapeutic activities;Gait training;Therapeutic exercise;Patient/family education;Balance training;Functional mobility training    PT Goals (Current goals can be found in the Care Plan section)  Acute Rehab PT Goals Patient Stated Goal: return home PT Goal Formulation: With patient Time For Goal Achievement: 01/27/20 Potential to Achieve Goals: Good    Frequency Min 3X/week   Barriers to discharge        Co-evaluation               AM-PAC PT "6 Clicks" Mobility  Outcome Measure Help needed turning from your back to your side while in a flat bed without using bedrails?: None Help needed moving from lying on your back to sitting on the side of a flat bed without using bedrails?: None Help needed moving to and from a bed to a chair (including a wheelchair)?: None Help needed standing up from a chair using your arms (e.g., wheelchair or bedside chair)?: None Help needed to walk in hospital room?: None Help needed climbing 3-5 steps with a railing? : A Little 6 Click Score: 23    End of Session Equipment  Utilized During Treatment: Gait belt;Oxygen Activity Tolerance: Patient tolerated treatment well Patient left: in bed;with call bell/phone within reach;with bed alarm set Nurse Communication: Mobility status PT Visit Diagnosis: Unsteadiness on feet (R26.81);Muscle weakness (generalized) (M62.81)    Time: 6295-2841 PT Time Calculation (min) (ACUTE ONLY): 24 min   Charges:   PT Evaluation $PT Eval Moderate Complexity: 1 Andi Hence, PT Acute Rehab Services Pager [phone] [facility] [phone]    Rayetta Humphrey 01/13/2020, 4:28 PM

## 2020-01-14 DIAGNOSIS — J9601 Acute respiratory failure with hypoxia: Secondary | ICD-10-CM

## 2020-01-14 DIAGNOSIS — Z7189 Other specified counseling: Secondary | ICD-10-CM

## 2020-01-14 DIAGNOSIS — Z515 Encounter for palliative care: Secondary | ICD-10-CM

## 2020-01-14 DIAGNOSIS — J441 Chronic obstructive pulmonary disease with (acute) exacerbation: Secondary | ICD-10-CM

## 2020-01-14 MED ORDER — IPRATROPIUM-ALBUTEROL 0.5-2.5 (3) MG/3ML IN SOLN
3.0000 mL | Freq: Four times a day (QID) | RESPIRATORY_TRACT | Status: DC
  Administered 2020-01-14 (×2): 3 mL via RESPIRATORY_TRACT
  Filled 2020-01-14 (×2): qty 3

## 2020-01-14 MED ORDER — IPRATROPIUM-ALBUTEROL 0.5-2.5 (3) MG/3ML IN SOLN
3.0000 mL | Freq: Three times a day (TID) | RESPIRATORY_TRACT | Status: DC
  Administered 2020-01-15 – 2020-01-17 (×8): 3 mL via RESPIRATORY_TRACT
  Filled 2020-01-14 (×8): qty 3

## 2020-01-14 MED ORDER — SENNA 8.6 MG PO TABS
1.0000 | ORAL_TABLET | Freq: Two times a day (BID) | ORAL | Status: DC
  Administered 2020-01-14 – 2020-01-16 (×6): 8.6 mg via ORAL
  Filled 2020-01-14 (×7): qty 1

## 2020-01-14 NOTE — Consult Note (Signed)
Consultation Note Date: 01/14/2020   Patient Name: Rebecca [name]  DOB: 1941-06-01  MRN: [id number]  Age / Sex: 79 y.o., female  PCP: Farris Has, MD Referring Physician: David Stall, Darin Engels, MD  Reason for Consultation: Establishing goals of care, Non pain symptom management and Psychosocial/spiritual support  HPI/Patient Profile: 79 y.o. female  with past medical history of COPD (on 6 L), diabetes mellitus, CHF (no echo noted in epic) who was admitted on 01/12/2020 with an inability to walk and shortness of breath.  CT scan shows severe bilateral emphysema.  She is felt to have very limited functional reserve by the medical doctors.   Clinical Assessment and Goals of Care:  I have reviewed medical records including EPIC notes, labs and imaging, received report from the care team, examined the patient and discussed diagnosis prognosis, GOC, EOL wishes, disposition and options.  I introduced Palliative Medicine as specialized medical care for people living with serious illness. It focuses on providing relief from the symptoms and stress of a serious illness.  I explained to Rebecca [name] and subsequently to her daughters that palliative care is for anyone with serious illness.  It helps with planning and anticipatory needs as well as symptom management.  Palliative care involves a visit from a nurse practitioner, PA or MD once every 2 months.  Hospice is for someone in the final phase of life with perhaps a prognosis of 6 months or less.  Hospice involves regularly scheduled hands-on care with an aide, RN, chaplain and social worker.  We discussed a brief life review of the patient.  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.  Her husband died approximately 9 years ago.  She has 2 daughters that live in [place] and 1 son in [place].  She has a  Jersey mix named Rebecca [name] that is her cherished companion.  She  is happy living at [facility] independent living and states she has many friends there.  As far as functional and nutritional status she normally uses a Rollator at home.  On admission she was unable to walk.  She is now able to walk with a rolling walker and cover short distances.  She states her feet and toes feel less numb today than usual.  We discussed her current illness and what it means in the larger context of her on-going co-morbidities.  Natural disease trajectory and expectations at EOL were discussed.  The patient understands her COPD will become progressively worse and not improved.  Dr. Denese Killings started an MOST form with her which she has completed but not yet signed.  I reviewed the MOST form with her daughters at bedside and answered their questions.  They appear comfortable with her decisions.  Patient's choices are for DNR, limited interventions on return to the hospital, yes for antibiotics, yes for IV fluids and yes to a feeding tube for a defined trial period.  Hospice and Palliative Care services outpatient were explained and offered.  While her daughters indicated that they would like for  her to be followed by palliative care, the patient herself felt that the services I described should be cared for by her primary care physician and therefore she declined palliative care to follow outpatient.  With regards to symptom management the patient commented that she is constipated and has had difficulty with bowel blockage in the past.  Questions and concerns were addressed.  The family was encouraged to call with questions or concerns.    Primary Decision Maker:  PATIENT    SUMMARY OF RECOMMENDATIONS    Code Status/Advance Care Planning:  DNR   Symptom Management:   Senna twice daily  Additional Recommendations (Limitations, Scope, Preferences):  Full Scope Treatment   DNR  Temporary feeding tube for a defined trial If needed  IV antibiotics if  needed  IV fluids if needed  Palliative Prophylaxis:   Frequent Pain Assessment, frequent dyspnea assessment  Psycho-social/Spiritual:   Desire for further Chaplaincy support: She is a [place] woman.  Not currently in need of chaplain support  Prognosis: Given her comorbidities and severe COPD it would not be surprising for her to have episodic decline requiring rehospitalization.  It would also not be surprising for her to have a prognosis of 1 year or less.    Discharge Planning: Home with Home Health      Primary Diagnoses: Present on Admission: . Chronic respiratory failure with hypoxia (HCC) . COPD with chronic bronchitis and emphysema (HCC) . COPD exacerbation (HCC) . Acute respiratory failure with hypoxia (HCC)   I have reviewed the medical record, interviewed the patient and family, and examined the patient. The following aspects are pertinent.  Past Medical History:  Diagnosis Date  . Congestive heart disease (HCC)   . COPD (chronic obstructive pulmonary disease) (HCC)   . Diabetes (HCC)   . Hyperlipemia   . Osteoporosis   . Primary hypertension    Social History   Socioeconomic History  . Marital status: Widowed    Spouse name: Not on file  . Number of children: Not on file  . Years of education: Not on file  . Highest education level: Not on file  Occupational History  . Not on file  Tobacco Use  . Smoking status: Former Smoker    Packs/day: 1.50    Years: 40.00    Pack years: 60.00    Types: Cigarettes    Quit date: 2003    Years since quitting: 18.5  . Smokeless tobacco: Never Used  Vaping Use  . Vaping Use: Never used  Substance and Sexual Activity  . Alcohol use: Yes    Alcohol/week: 1.0 standard drink    Types: 1 Glasses of wine per week  . Drug use: Never  . Sexual activity: Not on file  Other Topics Concern  . Not on file  Social History Narrative  . Not on file   Social Determinants of Health   Financial Resource Strain:    . Difficulty of Paying Living Expenses:   Food Insecurity:   . Worried About Programme researcher, broadcasting/film/video in the Last Year:   . Barista in the Last Year:   Transportation Needs:   . Freight forwarder (Medical):   Marland Kitchen Lack of Transportation (Non-Medical):   Physical Activity:   . Days of Exercise per Week:   . Minutes of Exercise per Session:   Stress:   . Feeling of Stress :   Social Connections:   . Frequency of Communication with Friends and Family:   .  Frequency of Social Gatherings with Friends and Family:   . Attends Religious Services:   . Active Member of Clubs or Organizations:   . Attends Banker Meetings:   Marland Kitchen Marital Status:    History reviewed. No pertinent family history.  Allergies  Allergen Reactions  . Amoxicillin Rash  . Lisinopril Rash  . Sulfacetamide Rash  . Tetracyclines & Related Rash    Vital Signs: BP (!) 162/81 (BP Location: Right Arm)   Pulse 98   Temp 98.2 F (36.8 C) (Oral)   Resp 16   Ht 5\' 2"  (1.575 m)   SpO2 91%   BMI 29.45 kg/m  Pain Scale: 0-10   Pain Score: Asleep   SpO2: SpO2: 91 % O2 Device:SpO2: 91 % O2 Flow Rate: .O2 Flow Rate (L/min): 6 L/min    Palliative Assessment/Data: 50%     Time In: 12:30 Time Out: 1:33 Time Total: 90 min. Visit consisted of counseling and education dealing with the complex and emotionally intense issues surrounding the need for palliative care and symptom management in the setting of serious and potentially life-threatening illness. Greater than 50%  of this time was spent counseling and coordinating care related to the above assessment and plan.  Signed by: , PA-C Palliative Medicine  Please contact Palliative Medicine Team phone at [phone] for questions and concerns.  For individual provider: See [phone]

## 2020-01-14 NOTE — Procedures (Signed)
Patient declined to wear CPAP tonight.

## 2020-01-14 NOTE — Progress Notes (Addendum)
NAME:  Rebecca [name], MRN:  [id number], DOB:  Jul 01, 1941, LOS: 1 ADMISSION DATE:  01/12/2020, CONSULTATION DATE:  01/12/20 REFERRING MD:  Sunnie Nielsen - TRH, CHIEF COMPLAINT:  *SOB  Brief History   79 yo F with severe COPD followed by Dr. Vassie Loll. Baseline 6LNC. Presents with SOB. Admitted to [facility]   History of present illness   79 year old woman with a history of severe COPD on 6 L of oxygen.  Compliant with therapy.  Presented today with vague complaints of tremors unsteadiness on her feet and slurred speech. CT and MRI head were negative.  Progressive increase in shortness of breath.  She is unable to clearly state whether her breathing is significantly worse today from baseline.  She is dyspneic on minimal exertion at baseline.  Denies sick contacts.  Denies increased cough or sputum production.  Denies chest pain.  Denied increased leg swelling to me. Did not spend much time out in the heat.  Has central air-conditioning  Past Medical History  COPD on 6LNC CHF DM HLD OA HTN  Significant Hospital Events   6/30 admitted to [facility], observation . PCCM consulted  Consults:  PCCM  Procedures:    Significant Diagnostic Tests:  6/30 CTA chest> no PE  Micro Data:  6/30 SARS Cov2> neg   Antimicrobials:  Doxy 7/1>  Interim history/subjective:  Feels better day by day  Objective   Blood pressure (!) 162/81, pulse 98, temperature 98.2 F (36.8 C), temperature source Oral, resp. rate 16, height 5\' 2"  (1.575 m), SpO2 91 %.        Intake/Output Summary (Last 24 hours) at 01/14/2020 1146 Last data filed at 01/13/2020 1800 Gross per 24 hour  Intake 600 ml  Output --  Net 600 ml   There were no vitals filed for this visit.  Examination: General:  Elderly female in NAD Neuro:  Alert, oriented, Non-focal HEENT:  [place]/AT, No JVD noted, PERRL Cardiovascular:  RRR, no MRG Lungs:  Faint expiratory wheeze Abdomen:  Soft, non-distended, non-tender Musculoskeletal:  No acute  deformity or ROM limitation Skin:  Intact, MMM    Resolved Hospital Problem list     Assessment & Plan:   Vague constellation of symptoms without clear signs of COPD exacerbation.  Very poor pulmonary reserve at baseline so may be difficult to distinguish any real worsening. - Continue home budesonide/brovana/albuterol - Solumedrol continue. Transition to prednisone with plans for 5 total days of steroids.  - Doxycycline per primary  Acute on chronic respiratory failure with hypoxia: Severe COPD on 4-6LNC. Has required 6L almost constantly for moths prior to admission.  - PRN BiPAP only. Nocturnal BiPAP decreased CO2 too far. pH rose to over 7.5 - IS - Poor functional status, PT following - OOB to chair. Mobilize as able.   Goals of Care -Palliative care has been consulted by [facility]. Patient is open to discussion with PCM for possible further symptom management   I have arranged for follow up appt in pulm office  PCCM sign off.   Best practice:  Diet: heart healthy Pain/Anxiety/Delirium protocol (if indicated): na VAP protocol (if indicated): na DVT prophylaxis: lovenox GI prophylaxis: protonix  Glucose control: monitor Mobility: PT prior to discharge given pt concerns for weakness  Code Status: Partial - no intubation Family Communication: per primary Disposition: Progressive  Labs   CBC: Recent Labs  Lab 01/12/20 1134 01/12/20 1320 01/13/20 0330 01/13/20 0527  WBC 6.1  --   --  6.2  NEUTROABS 4.4  --   --   --  HGB 12.3 13.3 14.6 12.4  HCT 41.1 39.0 43.0 40.7  MCV 103.3*  --   --  98.3  PLT 186  --   --  192    Basic Metabolic Panel: Recent Labs  Lab 01/12/20 1134 01/12/20 1256 01/12/20 1320 01/13/20 0330 01/13/20 0527 01/13/20 1457  NA 137  --  135 131* 135  --   K 5.9* 5.0 4.9 3.9 4.2  --   CL 89*  --   --   --  86*  --   CO2 40*  --   --   --  36*  --   GLUCOSE 116*  --   --   --  147*  --   BUN 14  --   --   --  16  --   CREATININE 0.51  --    --   --  0.68  --   CALCIUM 9.3  --   --   --  9.6  --   MG  --   --   --   --   --  1.7   GFR: CrCl cannot be calculated (Unknown ideal weight.). Recent Labs  Lab 01/12/20 1134 01/13/20 0527  WBC 6.1 6.2    Liver Function Tests: Recent Labs  Lab 01/12/20 1134 01/13/20 0527  AST 24 26  ALT 17 22  ALKPHOS 47 49  BILITOT 1.0 1.1  PROT 6.2* 6.5  ALBUMIN 3.5 3.7   No results for input(s): LIPASE, AMYLASE in the last 168 hours. Recent Labs  Lab 01/12/20 1256 01/13/20 0527  AMMONIA 19 12    ABG    Component Value Date/Time   PHART 7.585 (H) 01/13/2020 0330   PCO2ART 46.3 01/13/2020 0330   PO2ART 77 (L) 01/13/2020 0330   HCO3 43.9 (H) 01/13/2020 0330   TCO2 45 (H) 01/13/2020 0330   O2SAT 97.0 01/13/2020 0330     Coagulation Profile: Recent Labs  Lab 01/12/20 1134  INR 0.9    Cardiac Enzymes: No results for input(s): CKTOTAL, CKMB, CKMBINDEX, TROPONINI in the last 168 hours.  HbA1C: No results found for: HGBA1C  CBG: No results for input(s): GLUCAP in the last 168 hours.   Joneen Roach, AGACNP-BC [facility]  See Amion for personal pager PCCM on call pager [phone]  01/14/2020 11:46 AM

## 2020-01-14 NOTE — Progress Notes (Signed)
[facility] PROGRESS NOTE    Progress Note  Rebecca [name]  YQI:[id number] DOB: 1940-09-03 DOA: 01/12/2020 PCP: Farris Has, MD     Brief Narrative:   Rebecca [name] is an 79 y.o. female past medical history significant of for COPD on 6 L of oxygen heart failure comes in complaining of generalized weakness tremor and shortness of breath.  ABG was done this yields a PCO2 of 82  Assessment/Plan:   Acute on Chronic respiratory failure with hypoxia due to COPD exacerbation: Continue IV steroids antibiotics and inhalers. She relates her breathing is somewhat improved. Awaiting hospice and palliative care recommendations.  Tremors: Likely due to hypercarbia.  Vitamin B12 deficiency B12 169  Replete B12 intramuscularly.  Essential hypertension: Continue current home regimen.  Headache/acute encephalopathy: MRI negative question due to hypoxia    DVT prophylaxis: lovenox Family Communication:none Status is: Observation  The patient will require care spanning > 2 midnights and should be moved to inpatient because: Hemodynamically unstable  Dispo: The patient is from: ALF              Anticipated d/c is to: ALF              Anticipated d/c date is: 2 days              Patient currently is not medically stable to d/c.     Code Status:     Code Status Orders  (From admission, onward)         Start     Ordered   01/12/20 2008  Limited resuscitation (code)  Continuous       Question Answer Comment  In the event of cardiac or respiratory ARREST: Initiate Code Blue, Call Rapid Response Yes   In the event of cardiac or respiratory ARREST: Perform CPR Yes   In the event of cardiac or respiratory ARREST: Perform Intubation/Mechanical Ventilation No   In the event of cardiac or respiratory ARREST: Use NIPPV/BiPAp only if indicated Yes   In the event of cardiac or respiratory ARREST: Administer ACLS medications if indicated Yes   In the event of cardiac  or respiratory ARREST: Perform Defibrillation or Cardioversion if indicated Yes      01/12/20 2008        Code Status History    Date Active Date Inactive Code Status Order ID Comments User Context   01/12/2020 1738 01/12/2020 2008 Partial Code [id number]  Alba Cory, MD ED   Advance Care Planning Activity        IV Access:    Peripheral IV   Procedures and diagnostic studies:   CT Head Wo Contrast  Result Date: 01/12/2020 CLINICAL DATA:  Focal neuro deficit, greater than 6 hours, stroke suspected. Additional provided: Altered mental status since yesterday. Tremors. EXAM: CT HEAD WITHOUT CONTRAST TECHNIQUE: Contiguous axial images were obtained from the base of the skull through the vertex without intravenous contrast. COMPARISON:  No pertinent prior studies available for comparison. FINDINGS: Brain: There is mild generalized parenchymal atrophy. Mild ill-defined hypoattenuation within the cerebral white matter is nonspecific, but consistent with chronic small vessel ischemic disease. There is no acute intracranial hemorrhage. No demarcated cortical infarct is identified. No extra-axial fluid collection. No evidence of intracranial mass. No midline shift. Vascular: No hyperdense vessel.  Atherosclerotic calcifications. Skull: Normal. Negative for fracture or focal lesion. Sinuses/Orbits: Visualized orbits show no acute finding. Moderate-sized left maxillary sinus mucous retention cyst. Mild ethmoid and right sphenoid sinus mucosal thickening. Right  mastoid effusion. IMPRESSION: No CT evidence of acute intracranial abnormality. Mild generalized parenchymal atrophy and chronic small vessel ischemic disease. Mild ethmoid and right sphenoid sinus mucosal thickening. Left maxillary sinus mucous retention cyst. Right mastoid effusion. Electronically Signed   By: Jackey Loge DO   On: 01/12/2020 13:59   CT ANGIO CHEST PE W OR WO CONTRAST  Result Date: 01/12/2020 CLINICAL DATA:  Altered  mental status. EXAM: CT ANGIOGRAPHY CHEST WITH CONTRAST TECHNIQUE: Multidetector CT imaging of the chest was performed using the standard protocol during bolus administration of intravenous contrast. Multiplanar CT image reconstructions and MIPs were obtained to evaluate the vascular anatomy. CONTRAST:  94mL OMNIPAQUE IOHEXOL 350 MG/ML SOLN COMPARISON:  September 28, 2019. FINDINGS: Cardiovascular: Satisfactory opacification of the pulmonary arteries to the segmental level. No evidence of pulmonary embolism. Normal heart size. No pericardial effusion. Coronary artery calcifications are noted. Atherosclerosis of thoracic aorta is noted without aneurysm formation. Mediastinum/Nodes: Small sliding-type hiatal hernia is noted. No adenopathy is noted. Thyroid gland is unremarkable. Lungs/Pleura: No pneumothorax or pleural effusion is noted. 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 Mild bilateral posterior basilar subsegmental atelectasis is noted. Upper Abdomen: Bilateral nephrolithiasis is noted. Musculoskeletal: No chest wall abnormality. No acute or significant osseous findings. Review of the MIP images confirms the above findings. IMPRESSION: 1. No definite evidence of pulmonary embolus. 2. Coronary artery calcifications are noted suggesting coronary artery disease. 3. Small sliding-type hiatal hernia. 4. Bilateral nephrolithiasis. Aortic Atherosclerosis (ICD10-I70.0) and Emphysema (ICD10-J43.9). Electronically Signed   By: Lupita Raider M.D.   On: 01/12/2020 20:15   MR BRAIN WO CONTRAST  Result Date: 01/12/2020 CLINICAL DATA:  Altered mental status beginning yesterday.  Tremors. EXAM: MRI HEAD WITHOUT CONTRAST TECHNIQUE: Multiplanar, multiecho pulse sequences of the brain and surrounding structures were obtained without intravenous contrast. COMPARISON:  Head CT same day FINDINGS: Brain: Diffusion imaging does not show any acute or subacute infarction or other cause of restricted diffusion. Chronic  small-vessel ischemic changes affect the pons. No focal cerebellar finding. Cerebral hemispheres show mild chronic small-vessel ischemic change of the white matter, often seen at this age. No cortical or large vessel territory infarction. No mass lesion, hemorrhage, hydrocephalus or extra-axial collection. Vascular: Major vessels at the base of the brain show flow. Skull and upper cervical spine: Negative Sinuses/Orbits: Retention cyst left maxillary sinus. No sign of sinusitis. Orbits negative. Other: Mastoid effusions, more extensive on the right than the left. IMPRESSION: No acute intracranial finding. Age related atrophy. Mild chronic small-vessel ischemic change of the pons and cerebral hemispheric white matter, often seen at this age. Mastoid effusions right more than left. Electronically Signed   By: Paulina Fusi M.D.   On: 01/12/2020 15:59   DG Chest Portable 1 View  Result Date: 01/12/2020 CLINICAL DATA:  Shortness of breath. EXAM: PORTABLE CHEST 1 VIEW COMPARISON:  09/28/2019 FINDINGS: The heart size is within normal limits. Ectasia and atherosclerotic calcification of thoracic aorta remains stable. Pulmonary emphysema again noted. Both lungs are clear. IMPRESSION: Emphysema. No active disease. Electronically Signed   By: Danae Orleans M.D.   On: 01/12/2020 12:02     Medical Consultants:    None.  Anti-Infectives:  Doxycycline   Subjective:    Rebecca [name] relates her breathing is somewhat improved today.  Objective:    Vitals:   01/13/20 2055 01/14/20 0031 01/14/20 0750 01/14/20 0832  BP:  (!) 148/77  (!) 162/81  Pulse:  91  98  Resp:  18  16  Temp:  98.1 F (36.7 C)  98.2 F (36.8 C)  TempSrc:  Oral  Oral  SpO2: 96% 99% 90% 91%  Height:       SpO2: 91 % O2 Flow Rate (L/min): 6 L/min   Intake/Output Summary (Last 24 hours) at 01/14/2020 1006 Last data filed at 01/13/2020 1800 Gross per 24 hour  Intake 600 ml  Output --  Net 600 ml   There were no vitals  filed for this visit.  Exam: General exam: In no acute distress. Respiratory system: Good air movement and poor air movement Cardiovascular system: S1 & S2 heard, RRR. No JVD. Gastrointestinal system: Abdomen is nondistended, soft and nontender.  Extremities: No pedal edema. Skin: No rashes, lesions or ulcers Psychiatry: Judgement and insight appear normal. Mood & affect appropriate. Data Reviewed:    Labs: Basic Metabolic Panel: Recent Labs  Lab 01/12/20 1134 01/12/20 1256 01/12/20 1320 01/12/20 1320 01/13/20 0330 01/13/20 0527 01/13/20 1457  NA 137  --  135  --  131* 135  --   K 5.9*   < > 4.9   < > 3.9 4.2  --   CL 89*  --   --   --   --  86*  --   CO2 40*  --   --   --   --  36*  --   GLUCOSE 116*  --   --   --   --  147*  --   BUN 14  --   --   --   --  16  --   CREATININE 0.51  --   --   --   --  0.68  --   CALCIUM 9.3  --   --   --   --  9.6  --   MG  --   --   --   --   --   --  1.7   < > = values in this interval not displayed.   GFR CrCl cannot be calculated (Unknown ideal weight.). Liver Function Tests: Recent Labs  Lab 01/12/20 1134 01/13/20 0527  AST 24 26  ALT 17 22  ALKPHOS 47 49  BILITOT 1.0 1.1  PROT 6.2* 6.5  ALBUMIN 3.5 3.7   No results for input(s): LIPASE, AMYLASE in the last 168 hours. Recent Labs  Lab 01/12/20 1256 01/13/20 0527  AMMONIA 19 12   Coagulation profile Recent Labs  Lab 01/12/20 1134  INR 0.9   COVID-19 Labs  No results for input(s): DDIMER, FERRITIN, LDH, CRP in the last 72 hours.  Lab Results  Component Value Date   SARSCOV2NAA NEGATIVE 01/12/2020   SARSCOV2NAA NEGATIVE 09/28/2019    CBC: Recent Labs  Lab 01/12/20 1134 01/12/20 1320 01/13/20 0330 01/13/20 0527  WBC 6.1  --   --  6.2  NEUTROABS 4.4  --   --   --   HGB 12.3 13.3 14.6 12.4  HCT 41.1 39.0 43.0 40.7  MCV 103.3*  --   --  98.3  PLT 186  --   --  192   Cardiac Enzymes: No results for input(s): CKTOTAL, CKMB, CKMBINDEX, TROPONINI in  the last 168 hours. BNP (last 3 results) No results for input(s): PROBNP in the last 8760 hours. CBG: No results for input(s): GLUCAP in the last 168 hours. D-Dimer: No results for input(s): DDIMER in the last 72 hours. Hgb A1c: No results for input(s): HGBA1C in the last 72 hours. Lipid Profile:  No results for input(s): CHOL, HDL, LDLCALC, TRIG, CHOLHDL, LDLDIRECT in the last 72 hours. Thyroid function studies: Recent Labs    01/13/20 0527  TSH 0.661   Anemia work up: Recent Labs    01/13/20 0527  VITAMINB12 169*   Sepsis Labs: Recent Labs  Lab 01/12/20 1134 01/13/20 0527  WBC 6.1 6.2   Microbiology Recent Results (from the past 240 hour(s))  Urine culture     Status: None (Preliminary result)   Collection Time: 01/12/20 11:34 AM   Specimen: Urine, Random  Result Value Ref Range Status   Specimen Description URINE, RANDOM  Final   Special Requests NONE  Final   Culture   Final    CULTURE REINCUBATED FOR BETTER GROWTH Performed at [facility], [address] [place], [place] [address]    Report Status PENDING  Incomplete  SARS Coronavirus 2 by RT PCR (hospital order, performed in [facility] hospital lab) Nasopharyngeal Nasopharyngeal Swab     Status: None   Collection Time: 01/12/20  9:25 PM   Specimen: Nasopharyngeal Swab  Result Value Ref Range Status   SARS Coronavirus 2 NEGATIVE NEGATIVE Final    Comment: (NOTE) SARS-CoV-2 target nucleic acids are NOT DETECTED.  The SARS-CoV-2 RNA is generally detectable in upper and lower respiratory specimens during the acute phase of infection. The lowest concentration of SARS-CoV-2 viral copies this assay can detect is 250 copies / mL. A negative result does not preclude SARS-CoV-2 infection and should not be used as the sole basis for treatment or other patient management decisions.  A negative result may occur with improper specimen collection / handling, submission of specimen other than nasopharyngeal  swab, presence of viral mutation(s) within the areas targeted by this assay, and inadequate number of viral copies (<250 copies / mL). A negative result must be combined with clinical observations, patient history, and epidemiological information.  Fact Sheet for Patients:   BoilerBrush.com.cy  Fact Sheet for Healthcare Providers: https://pope.com/  This test is not yet approved or  cleared by the [place] FDA and has been authorized for detection and/or diagnosis of SARS-CoV-2 by FDA under an Emergency Use Authorization (EUA).  This EUA will remain in effect (meaning this test can be used) for the duration of the COVID-19 declaration under Section 564(b)(1) of the Act, 21 U.S.C. section 360bbb-3(b)(1), unless the authorization is terminated or revoked sooner.  Performed at [facility], [address], [place], [place] [address]      Medications:    albuterol  2.5 mg Nebulization Q6H   arformoterol  15 mcg Nebulization BID   budesonide  0.5 mg Nebulization Daily   calcium-vitamin D  1 tablet Oral Daily   carvedilol  25 mg Oral BID WC   cyanocobalamin  1,000 mcg Intramuscular Daily   enoxaparin (LOVENOX) injection  40 mg Subcutaneous Q24H   guaiFENesin  600 mg Oral BID   ipratropium  0.5 mg Nebulization Q6H   methylPREDNISolone (SOLU-MEDROL) injection  40 mg Intravenous Q12H   pantoprazole  40 mg Oral Q1200   pravastatin  10 mg Oral Daily   sodium chloride flush  3 mL Intravenous Q12H   Continuous Infusions:  sodium chloride     doxycycline (VIBRAMYCIN) IV 100 mg (01/14/20 0904)      LOS: 1 day   Marinda Elk  [facility]  01/14/2020, 10:06 AM

## 2020-01-15 DIAGNOSIS — J449 Chronic obstructive pulmonary disease, unspecified: Secondary | ICD-10-CM

## 2020-01-15 LAB — URINE CULTURE: Culture: >=100000 — AB

## 2020-01-15 MED ORDER — DOXYCYCLINE HYCLATE 100 MG PO TABS: 100.0000 mg | ORAL_TABLET | Freq: Two times a day (BID) | ORAL | 0 refills | Status: DC

## 2020-01-15 MED ORDER — PREDNISONE 10 MG PO TABS
ORAL_TABLET | ORAL | 0 refills | Status: DC
Start: 2020-01-15 — End: 2020-02-08

## 2020-01-15 MED ORDER — DOXYCYCLINE HYCLATE 100 MG PO TABS
100.0000 mg | ORAL_TABLET | Freq: Two times a day (BID) | ORAL | Status: AC
  Administered 2020-01-15 – 2020-01-17 (×4): 100 mg via ORAL
  Filled 2020-01-15 (×4): qty 1

## 2020-01-15 MED ORDER — DOXYCYCLINE HYCLATE 100 MG PO TABS: 100.0000 mg | ORAL_TABLET | Freq: Two times a day (BID) | ORAL | 0 refills | Status: AC

## 2020-01-15 MED ORDER — CYANOCOBALAMIN 1000 MCG/ML IJ SOLN: 1000.0000 ug | Freq: Every day | INTRAMUSCULAR | 0 refills | Status: DC

## 2020-01-15 NOTE — TOC Transition Note (Addendum)
Transition of Care [facility]) - CM/SW Discharge Note   Patient Details  Name: Rebecca [name] MRN: [id number] Date of Birth: [birth year]  [facility] CM/SW Contact:  Lawerance Sabal, RN Phone Number: 01/15/2020, 12:38 PM   Clinical Narrative:    [name] w patient over the phone. Would like to return to ILF at DC w therapy continued through [facility]. Placed order for HH/ outpatient PT OT RT as requested by patient and needed for [facility].  Faxed orders to Benjaman Kindler at [phone]  Patient has home oxygen and oxygen for transportation home as provided by her daughters who will take her home.    Final next level of care: Home w Home Health Services Barriers to Discharge: No Barriers Identified   Patient Goals and CMS Choice Patient states their goals for this hospitalization and ongoing recovery are:: to go back to ILF CMS Medicare.gov Compare Post Acute Care list provided to:: Patient Choice offered to / list presented to : Patient  Discharge Placement                       Discharge Plan and Services                          HH Arranged: OT, PT, Respirator Therapy   Date [facility] Agency Contacted: 01/15/20 Time HH Agency Contacted: [phone] Representative spoke with at [facility] Agency: Benjaman Kindler w [facility]  Social Determinants of Health (SDOH) Interventions     Readmission Risk Interventions No flowsheet data found.

## 2020-01-15 NOTE — Discharge Summary (Addendum)
Physician Discharge Summary  Caitlyn Buchanan IRC:[id number] DOB: Oct 03, 1940 DOA: 01/12/2020  PCP: London Pepper, MD  Admit date: 01/12/2020 Discharge date: 01/17/2020  Admitted From: Home Disposition:  ALF  Recommendations for Outpatient Follow-up:  1. Follow up with Pulmonary in 1-2 weeks 2. Please obtain BMP/CBC in one week 3.   Home Health:Yes Equipment/Devices:Yes  Discharge Condition:Stable CODE STATUS:DNR  Diet recommendation: Heart Healthy  Brief/Interim Summary: 79 y.o. female past medical history significant of for COPD on 6 L of oxygen heart failure comes in complaining of generalized weakness tremor and shortness of breath.  ABG was done this yields a PCO2 of 82  Discharge Diagnoses:  Active Problems:   Chronic respiratory failure with hypoxia (HCC)   COPD with chronic bronchitis and emphysema (HCC)   Nonischemic cardiomyopathy (HCC)   COPD exacerbation (HCC)   Acute respiratory failure with hypoxia (HCC)   Chronic obstructive pulmonary disease with acute exacerbation (HCC)   Palliative care encounter   Goals of care, counseling/discussion  Acute on chronic respiratory failure with hypoxia and hypercapnia due to COPD exacerbation: Started on IV steroids antibiotics and inhalers. CT angio of the chest showed no PE or infiltrates. She was deescalated to oral steroids which she will finish a taper as an outpatient. Palliative  care met with family and she chose to be a DNR but continue full scope of treatment. Physical therapy evaluated the patient and recommended skilled nursing facility, the patient and daughter were adamantly refusing skilled nursing facility after explaining the risk and benefits to them they understand and are willing to take the risk. She will go to assisted living facility with physical therapy.  Tremors: Likely due to hypercarbia now resolved.  Vitamin B12 deficiency: She completed a 7-day course of intramuscular vitamin B12  deficiency.  Essentiall hypertension: No changes made to her medication.  Acute metabolic encephalopathy: MRI of the brain negative likely due to hypoxia and hypercarbia.  Discharge Instructions  Discharge Instructions    Diet - low sodium heart healthy   Complete by: As directed    Increase activity slowly   Complete by: As directed      Allergies as of 01/17/2020      Reactions   Amoxicillin Rash   Lisinopril Rash   Sulfacetamide Rash   Tetracyclines & Related Rash      Medication List    STOP taking these medications   azithromycin 250 MG tablet Commonly known as: ZITHROMAX     TAKE these medications   acetaminophen 500 MG tablet Commonly known as: TYLENOL Take 1,000 mg by mouth 2 (two) times daily as needed for mild pain or headache.   albuterol (2.5 MG/3ML) 0.083% nebulizer solution Commonly known as: PROVENTIL PLEASE SEE ATTACHED FOR DETAILED DIRECTIONS What changed:   how much to take  how to take this  when to take this  additional instructions   albuterol 108 (90 Base) MCG/ACT inhaler Commonly known as: VENTOLIN HFA Inhale 1-2 puffs into the lungs every 6 (six) hours as needed for wheezing or shortness of breath. What changed: Another medication with the same name was changed. Make sure you understand how and when to take each.   arformoterol 15 MCG/2ML Nebu Commonly known as: BROVANA Take 2 mLs (15 mcg total) by nebulization 2 (two) times daily.   budesonide 0.5 MG/2ML nebulizer solution Commonly known as: Pulmicort Take 2 mLs (0.5 mg total) by nebulization daily.   Calcium 600+D 600-200 MG-UNIT Tabs Generic drug: Calcium Carbonate-Vitamin D Take 1 tablet  by mouth daily.   carvedilol 25 MG tablet Commonly known as: COREG Take 25 mg by mouth 2 (two) times daily.   doxycycline 100 MG tablet Commonly known as: VIBRA-TABS Take 1 tablet (100 mg total) by mouth every 12 (twelve) hours for 3 days.   estradiol 0.1 MG/GM vaginal  cream Commonly known as: ESTRACE Place 1 Applicatorful vaginally every other day.   fluticasone 50 MCG/ACT nasal spray Commonly known as: FLONASE Place 2 sprays into both nostrils daily as needed for allergies or rhinitis.   metFORMIN 500 MG tablet Commonly known as: GLUCOPHAGE Take 500 mg by mouth daily with supper.   pravastatin 10 MG tablet Commonly known as: PRAVACHOL Take 10 mg by mouth daily.   predniSONE 10 MG tablet Commonly known as: DELTASONE Takes 6 tablets for 1 days, then 5 tablets for 1 days, then 4 tablets for 1 days, then 3 tablets for 1 days, then 2 tabs for 1 days, then 1 tab for 1 days, and then stop.   predniSONE 10 MG tablet Commonly known as: DELTASONE Takes 6 tablets for 1 days, then 5 tablets for 1 days, then 4 tablets for 1 days, then 3 tablets for 1 days, then 2 tabs for 1 days, then 1 tab for 1 days, and then stop.       Follow-up Information    Parrett, Fonnie Mu, NP Follow up on 02/08/2020.   Specialty: Pulmonary Disease Why: 1130 Contact information: [address] [place] [address] [place]. Follow up.   Specialty: Physical Therapy Why: Your home health will resume with the agency listed above. If you have any questions or concerns please call the number listed above.  Contact information: [address] [place] [address] [phone]              Allergies  Allergen Reactions  . Amoxicillin Rash  . Lisinopril Rash  . Sulfacetamide Rash  . Tetracyclines & Related Rash    Consultations:  Pulmonary and critical care   Procedures/Studies: CT Head Wo Contrast  Result Date: 01/12/2020 CLINICAL DATA:  Focal neuro deficit, greater than 6 hours, stroke suspected. Additional provided: Altered mental status since yesterday. Tremors. EXAM: CT HEAD WITHOUT CONTRAST TECHNIQUE: Contiguous axial images were obtained from the base of the skull through the vertex  without intravenous contrast. COMPARISON:  No pertinent prior studies available for comparison. FINDINGS: Brain: There is mild generalized parenchymal atrophy. Mild ill-defined hypoattenuation within the cerebral white matter is nonspecific, but consistent with chronic small vessel ischemic disease. There is no acute intracranial hemorrhage. No demarcated cortical infarct is identified. No extra-axial fluid collection. No evidence of intracranial mass. No midline shift. Vascular: No hyperdense vessel.  Atherosclerotic calcifications. Skull: Normal. Negative for fracture or focal lesion. Sinuses/Orbits: Visualized orbits show no acute finding. Moderate-sized left maxillary sinus mucous retention cyst. Mild ethmoid and right sphenoid sinus mucosal thickening. Right mastoid effusion. IMPRESSION: No CT evidence of acute intracranial abnormality. Mild generalized parenchymal atrophy and chronic small vessel ischemic disease. Mild ethmoid and right sphenoid sinus mucosal thickening. Left maxillary sinus mucous retention cyst. Right mastoid effusion. Electronically Signed   By: Kellie Simmering DO   On: 01/12/2020 13:59   CT ANGIO CHEST PE W OR WO CONTRAST  Result Date: 01/12/2020 CLINICAL DATA:  Altered mental status. EXAM: CT ANGIOGRAPHY CHEST WITH CONTRAST TECHNIQUE: Multidetector CT imaging of the chest was performed using the standard protocol  during bolus administration of intravenous contrast. Multiplanar CT image reconstructions and MIPs were obtained to evaluate the vascular anatomy. CONTRAST:  69m OMNIPAQUE IOHEXOL 350 MG/ML SOLN COMPARISON:  September 28, 2019. FINDINGS: Cardiovascular: Satisfactory opacification of the pulmonary arteries to the segmental level. No evidence of pulmonary embolism. Normal heart size. No pericardial effusion. Coronary artery calcifications are noted. Atherosclerosis of thoracic aorta is noted without aneurysm formation. Mediastinum/Nodes: Small sliding-type hiatal hernia is noted. No  adenopathy is noted. Thyroid gland is unremarkable. Lungs/Pleura: No pneumothorax or pleural effusion is noted. 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 Mild bilateral posterior basilar subsegmental atelectasis is noted. Upper Abdomen: Bilateral nephrolithiasis is noted. Musculoskeletal: No chest wall abnormality. No acute or significant osseous findings. Review of the MIP images confirms the above findings. IMPRESSION: 1. No definite evidence of pulmonary embolus. 2. Coronary artery calcifications are noted suggesting coronary artery disease. 3. Small sliding-type hiatal hernia. 4. Bilateral nephrolithiasis. Aortic Atherosclerosis (ICD10-I70.0) and Emphysema (ICD10-J43.9). Electronically Signed   By: JMarijo ConceptionM.D.   On: 01/12/2020 20:15   MR BRAIN WO CONTRAST  Result Date: 01/12/2020 CLINICAL DATA:  Altered mental status beginning yesterday.  Tremors. EXAM: MRI HEAD WITHOUT CONTRAST TECHNIQUE: Multiplanar, multiecho pulse sequences of the brain and surrounding structures were obtained without intravenous contrast. COMPARISON:  Head CT same day FINDINGS: Brain: Diffusion imaging does not show any acute or subacute infarction or other cause of restricted diffusion. Chronic small-vessel ischemic changes affect the pons. No focal cerebellar finding. Cerebral hemispheres show mild chronic small-vessel ischemic change of the white matter, often seen at this age. No cortical or large vessel territory infarction. No mass lesion, hemorrhage, hydrocephalus or extra-axial collection. Vascular: Major vessels at the base of the brain show flow. Skull and upper cervical spine: Negative Sinuses/Orbits: Retention cyst left maxillary sinus. No sign of sinusitis. Orbits negative. Other: Mastoid effusions, more extensive on the right than the left. IMPRESSION: No acute intracranial finding. Age related atrophy. Mild chronic small-vessel ischemic change of the pons and cerebral hemispheric white matter, often  seen at this age. Mastoid effusions right more than left. Electronically Signed   By: MNelson ChimesM.D.   On: 01/12/2020 15:59   DG Chest Portable 1 View  Result Date: 01/12/2020 CLINICAL DATA:  Shortness of breath. EXAM: PORTABLE CHEST 1 VIEW COMPARISON:  09/28/2019 FINDINGS: The heart size is within normal limits. Ectasia and atherosclerotic calcification of thoracic aorta remains stable. Pulmonary emphysema again noted. Both lungs are clear. IMPRESSION: Emphysema. No active disease. Electronically Signed   By: JMarlaine HindM.D.   On: 01/12/2020 12:02   (Echo, Carotid, EGD, Colonoscopy, ERCP)    Subjective: Relates her breathing is back to baseline  Discharge Exam: Vitals:   01/17/20 0830 01/17/20 1430  BP:    Pulse: 74 76  Resp: 20 20  Temp:    SpO2: 96% 98%   Vitals:   01/16/20 2048 01/17/20 0753 01/17/20 0830 01/17/20 1430  BP:  (!) 144/76    Pulse:  83 74 76  Resp:  _0 Temp:  98.3 F (36.8 C)    TempSrc:      SpO2: 94% 96% 96% 98%  Weight:      Height:        General: Pt is alert, awake, not in acute distress Cardiovascular: RRR, S1/S2 +, no rubs, no gallops Respiratory: CTA bilaterally, no wheezing, no rhonchi Abdominal: Soft, NT, ND, bowel sounds + Extremities: no edema, no cyanosis  The results of significant diagnostics from this hospitalization (including imaging, microbiology, ancillary and laboratory) are listed below for reference.     Microbiology: Recent Results (from the past 240 hour(s))  Urine culture     Status: Abnormal   Collection Time: 01/12/20 11:34 AM   Specimen: Urine, Random  Result Value Ref Range Status   Specimen Description URINE, RANDOM  Final   Special Requests   Final    NONE Performed at [facility], [address], [place], [place] [address]    Culture >=100,000 COLONIES/mL VIRIDANS STREPTOCOCCUS (A)  Final   Report Status 01/15/2020 FINAL  Final  SARS Coronavirus 2 by RT PCR (hospital order, performed in  [facility] hospital lab) Nasopharyngeal Nasopharyngeal Swab     Status: None   Collection Time: 01/12/20  9:25 PM   Specimen: Nasopharyngeal Swab  Result Value Ref Range Status   SARS Coronavirus 2 NEGATIVE NEGATIVE Final    Comment: (NOTE) SARS-CoV-2 target nucleic acids are NOT DETECTED.  The SARS-CoV-2 RNA is generally detectable in upper and lower respiratory specimens during the acute phase of infection. The lowest concentration of SARS-CoV-2 viral copies this assay can detect is 250 copies / mL. A negative result does not preclude SARS-CoV-2 infection and should not be used as the sole basis for treatment or other patient management decisions.  A negative result may occur with improper specimen collection / handling, submission of specimen other than nasopharyngeal swab, presence of viral mutation(s) within the areas targeted by this assay, and inadequate number of viral copies (<250 copies / mL). A negative result must be combined with clinical observations, patient history, and epidemiological information.  Fact Sheet for Patients:   StrictlyIdeas.no  Fact Sheet for Healthcare Providers: BankingDealers.co.za  This test is not yet approved or  cleared by the [place] FDA and has been authorized for detection and/or diagnosis of SARS-CoV-2 by FDA under an Emergency Use Authorization (EUA).  This EUA will remain in effect (meaning this test can be used) for the duration of the COVID-19 declaration under Section 564(b)(1) of the Act, 21 U.S.C. section 360bbb-3(b)(1), unless the authorization is terminated or revoked sooner.  Performed at [facility], [place] [address] [place], [place] [address]      Labs: BNP (last 3 results) Recent Labs    09/28/19 1039 01/12/20 1135  BNP 124.5* 28.4   Basic Metabolic Panel: Recent Labs  Lab 01/12/20 1134 01/12/20 1256 01/12/20 1320 01/13/20 0330 01/13/20 0527  01/13/20 1457  NA 137  --  135 131* 135  --   K 5.9* 5.0 4.9 3.9 4.2  --   CL 89*  --   --   --  86*  --   CO2 40*  --   --   --  36*  --   GLUCOSE 116*  --   --   --  147*  --   BUN 14  --   --   --  16  --   CREATININE 0.51  --   --   --  0.68  --   CALCIUM 9.3  --   --   --  9.6  --   MG  --   --   --   --   --  1.7   Liver Function Tests: Recent Labs  Lab 01/12/20 1134 01/13/20 0527  AST 24 26  ALT 17 22  ALKPHOS 47 49  BILITOT 1.0 1.1  PROT 6.2* 6.5  ALBUMIN 3.5 3.7   No results for input(s): LIPASE, AMYLASE in the last 168 hours. Recent Labs  Lab 01/12/20 1256 01/13/20 0527  AMMONIA 19 12   CBC: Recent Labs  Lab 01/12/20 1134 01/12/20 1320 01/13/20 0330 01/13/20 0527  WBC 6.1  --   --  6.2  NEUTROABS 4.4  --   --   --   HGB 12.3 13.3 14.6 12.4  HCT 41.1 39.0 43.0 40.7  MCV 103.3*  --   --  98.3  PLT 186  --   --  192   Cardiac Enzymes: No results for input(s): CKTOTAL, CKMB, CKMBINDEX, TROPONINI in the last 168 hours. BNP: Invalid input(s): POCBNP CBG: No results for input(s): GLUCAP in the last 168 hours. D-Dimer No results for input(s): DDIMER in the last 72 hours. Hgb A1c No results for input(s): HGBA1C in the last 72 hours. Lipid Profile No results for input(s): CHOL, HDL, LDLCALC, TRIG, CHOLHDL, LDLDIRECT in the last 72 hours. Thyroid function studies No results for input(s): TSH, T4TOTAL, T3FREE, THYROIDAB in the last 72 hours.  Invalid input(s): FREET3 Anemia work up No results for input(s): VITAMINB12, FOLATE, FERRITIN, TIBC, IRON, RETICCTPCT in the last 72 hours. Urinalysis    Component Value Date/Time   COLORURINE YELLOW 01/12/2020 1256   APPEARANCEUR HAZY (A) 01/12/2020 1256   LABSPEC 1.008 01/12/2020 1256   PHURINE 6.0 01/12/2020 1256   GLUCOSEU NEGATIVE 01/12/2020 1256   HGBUR SMALL (A) 01/12/2020 1256   BILIRUBINUR NEGATIVE 01/12/2020 1256   KETONESUR 5 (A) 01/12/2020 1256   PROTEINUR NEGATIVE 01/12/2020 1256   NITRITE  NEGATIVE 01/12/2020 1256   LEUKOCYTESUR NEGATIVE 01/12/2020 1256   Sepsis Labs Invalid input(s): PROCALCITONIN,  WBC,  LACTICIDVEN Microbiology Recent Results (from the past 240 hour(s))  Urine culture     Status: Abnormal   Collection Time: 01/12/20 11:34 AM   Specimen: Urine, Random  Result Value Ref Range Status   Specimen Description URINE, RANDOM  Final   Special Requests   Final    NONE Performed at [facility], [place] [address] [place], [place] [address]    Culture >=100,000 COLONIES/mL VIRIDANS STREPTOCOCCUS (A)  Final   Report Status 01/15/2020 FINAL  Final  SARS Coronavirus 2 by RT PCR (hospital order, performed in [facility] hospital lab) Nasopharyngeal Nasopharyngeal Swab     Status: None   Collection Time: 01/12/20  9:25 PM   Specimen: Nasopharyngeal Swab  Result Value Ref Range Status   SARS Coronavirus 2 NEGATIVE NEGATIVE Final    Comment: (NOTE) SARS-CoV-2 target nucleic acids are NOT DETECTED.  The SARS-CoV-2 RNA is generally detectable in upper and lower respiratory specimens during the acute phase of infection. The lowest concentration of SARS-CoV-2 viral copies this assay can detect is 250 copies / mL. A negative result does not preclude SARS-CoV-2 infection and should not be used as the sole basis for treatment or other patient management decisions.  A negative result may occur with improper specimen collection / handling, submission of specimen other than nasopharyngeal swab, presence of viral mutation(s) within the areas targeted by this assay, and inadequate number of viral copies (<250 copies / mL). A negative result must be combined with clinical observations, patient history, and epidemiological information.  Fact Sheet for Patients:   StrictlyIdeas.no  Fact Sheet for Healthcare Providers: BankingDealers.co.za  This test is not yet approved or  cleared by the [place] FDA and has been  authorized for detection and/or diagnosis of SARS-CoV-2 by FDA  under an Emergency Use Authorization (EUA).  This EUA will remain in effect (meaning this test can be used) for the duration of the COVID-19 declaration under Section 564(b)(1) of the Act, 21 U.S.C. section 360bbb-3(b)(1), unless the authorization is terminated or revoked sooner.  Performed at [facility], [place] [address] [place], [place] [address]      Time coordinating discharge: Over 30 minutes  SIGNED:   Charlynne Cousins, MD  [facility] 01/17/2020, 3:54 PM Pager   If 7PM-7AM, please contact night-coverage [email or web address] Password [email or web address]

## 2020-01-15 NOTE — Progress Notes (Signed)
Physical Therapy Treatment Patient Details Name: Rebecca [name] MRN: [id number] DOB: [birth year] Today's Date: 01/15/2020    History of Present Illness Pt is an 79 y.o. female past medical history significant of for COPD on 6 L of oxygen and heart failure. She presented with complaints of comes generalized weakness, tremor, and shortness of breath.  Pt admitted with acute on chronic resp failure with hypoxia.  CT chest negative for PE and MRI head negative for acute changes.    PT Comments    Pt limited by fatigue this session, only tolerating very short bouts of ambulation and activity. Pt requires some physical assistance when fatigued to safely transfer, and takes prolonged periods of time to recover when fatigued. Per pt and family the pt has limited assistance available at her independent living facility and needs to be able to perform most ADLs independently. Due to rapid fatigue pt would not be able to safely do that at this time and is at an increased risk for sustaining a fall. PT updates recommendations to SNF at this time, pt and family is agreeable to pursuing short term rehab placement   Follow Up Recommendations  SNF;Supervision/Assistance - 24 hour     Equipment Recommendations  None recommended by PT    Recommendations for Other Services       Precautions / Restrictions Precautions Precautions: Fall Precaution Comments: monitor O2 Restrictions Weight Bearing Restrictions: No    Mobility  Bed Mobility Overal bed mobility: Needs Assistance Bed Mobility: Supine to Sit     Supine to sit: Supervision        Transfers Overall transfer level: Needs assistance Equipment used: 4-wheeled walker Transfers: Sit to/from Stand;Stand Pivot Transfers Sit to Stand: Min guard;Min assist (minA with fatigue) Stand pivot transfers: Min assist       General transfer comment: pt performs 3 sit to stands with minG initially but requiring minA from rollator, pt also  requires minA  for SPT to recliner  Ambulation/Gait Ambulation/Gait assistance: Min guard Gait Distance (Feet): 40 Feet (10') Assistive device: 4-wheeled walker Gait Pattern/deviations: Step-to pattern Gait velocity: reduced Gait velocity interpretation: <1.8 ft/sec, indicate of risk for recurrent falls General Gait Details: pt with shortened step to gait pattern, reduced gait speed, limited by fatigue   Stairs             Wheelchair Mobility    Modified Rankin (Stroke Patients Only)       Balance Overall balance assessment: Needs assistance Sitting-balance support: No upper extremity supported;Feet supported Sitting balance-Leahy Scale: Good Sitting balance - Comments: supervision to change underwear while on commode   Standing balance support: Bilateral upper extremity supported Standing balance-Leahy Scale: Poor Standing balance comment: reliant on BUE support during standing activity                            Cognition Arousal/Alertness: Awake/alert Behavior During Therapy: WFL for tasks assessed/performed Overall Cognitive Status: Within Functional Limits for tasks assessed                                        Exercises      General Comments General comments (skin integrity, edema, etc.): pt on 4L [place] upon arrival, increased to 6L [place] for mobility. Pt desats to 75% with ambulation and takes over 5 minutes to recover, needing to be transferred to chair  for supported sitting to return sats above 85%. Pt left on 4L [place] at end of session resting in recliner      Pertinent Vitals/Pain Pain Assessment: No/denies pain    Home Living                      Prior Function            PT Goals (current goals can now be found in the care plan section) Acute Rehab PT Goals Patient Stated Goal: to get stornger and return to independence Progress towards PT goals: Not progressing toward goals - comment (limited by fatigue)     Frequency    Min 3X/week      PT Plan Discharge plan needs to be updated    Co-evaluation              AM-PAC PT "6 Clicks" Mobility   Outcome Measure  Help needed turning from your back to your side while in a flat bed without using bedrails?: None Help needed moving from lying on your back to sitting on the side of a flat bed without using bedrails?: None Help needed moving to and from a bed to a chair (including a wheelchair)?: A Little Help needed standing up from a chair using your arms (e.g., wheelchair or bedside chair)?: A Little Help needed to walk in hospital room?: A Little Help needed climbing 3-5 steps with a railing? : A Lot 6 Click Score: 19    End of Session Equipment Utilized During Treatment: Oxygen Activity Tolerance: Patient limited by fatigue Patient left: in chair;with call bell/phone within reach;with family/visitor present Nurse Communication: Mobility status PT Visit Diagnosis: Unsteadiness on feet (R26.81);Muscle weakness (generalized) (M62.81)     Time: 1696-7893 PT Time Calculation (min) (ACUTE ONLY): 41 min  Charges:  $Gait Training: 8-22 mins $Therapeutic Activity: 23-37 mins                     Arlyss Gandy, PT, DPT Acute Rehabilitation Pager: [phone]    Arlyss Gandy 01/15/2020, 2:16 PM

## 2020-01-15 NOTE — TOC Progression Note (Addendum)
Transition of Care [facility]) - Progression Note    Patient Details  Name: Rebecca [name] MRN: [id number] Date of Birth: November 05, 1940  Transition of Care [facility]) CM/SW [place], [place] Phone Number: 01/15/2020, 3:32 PM  Clinical Narrative:     CSW, RN and MD contacted by PT and informed discharge recommendation is SNF due to patient regression. Patient resides at an IDL and has limited support. CSW met with patient bedside to discuss discharge plan. Patient is agreeable to SNF and provided permission to be faxed out to the [facility] area. CSW explained insurance authorization process. No further questions expressed at this time.  CSW contacted [facility] to confirm they manage patient's [employer]. Representative was unable to confirm insurance is managed by [facility], but agreed to follow-up with CSW to verify. CSW faxed clinicals for review.   Expected Discharge Plan: Skilled Nursing Facility Barriers to Discharge: Insurance Authorization  Expected Discharge Plan and Services Expected Discharge Plan: [place]       Living arrangements for the past 2 months: [place] Expected Discharge Date: 01/15/20                         HH Arranged: OT, PT, Respirator Therapy   Date HH Agency Contacted: 01/15/20 Time [place]: 1238 Representative spoke with at [place]: Philippa Sicks w [facility]   Social Determinants of Health (SDOH) Interventions    Readmission Risk Interventions No flowsheet data found.

## 2020-01-15 NOTE — NC FL2 (Signed)
[place] MEDICAID FL2 LEVEL OF CARE SCREENING TOOL     IDENTIFICATION  Patient Name: Rebecca [name] Birthdate: 10-Jun-1941 Sex: female Admission Date (Current Location): 01/12/2020  [facility] and [place] Number:  Producer, television/film/video and Address:  [facility], [address] [place], [place] [address]      Provider Number: [id number]  Attending Physician Name and Address:  Marinda Elk, MD  Relative Name and Phone Number:  Tawana Scale    Current Level of Care: Hospital Recommended Level of Care: Skilled Nursing Facility Prior Approval Number:    Date Approved/Denied:   PASRR Number: [id number]  Discharge Plan: SNF    Current Diagnoses: Patient Active Problem List   Diagnosis Date Noted  . Chronic obstructive pulmonary disease with acute exacerbation (HCC)   . Palliative care encounter   . Goals of care, counseling/discussion   . Acute respiratory failure with hypoxia (HCC) 01/13/2020  . COPD exacerbation (HCC) 01/12/2020  . Nonischemic cardiomyopathy (HCC) 07/02/2018  . Lung nodule 06/18/2018  . Chronic respiratory failure with hypoxia (HCC) 02/05/2018  . COPD with chronic bronchitis and emphysema (HCC) 02/05/2018    Orientation RESPIRATION BLADDER Height & Weight     Self, Time, Situation, Place  Normal Incontinent, External catheter Weight:   Height:  5\' 2"  (157.5 cm)  BEHAVIORAL SYMPTOMS/MOOD NEUROLOGICAL BOWEL NUTRITION STATUS      Continent Diet (See discharge summary)  AMBULATORY STATUS COMMUNICATION OF NEEDS Skin   Limited Assist Verbally Normal                       Personal Care Assistance Level of Assistance  Bathing, Feeding, Dressing Bathing Assistance: Limited assistance Feeding assistance: Independent Dressing Assistance: Limited assistance     Functional Limitations Info  Sight, Speech, Hearing Sight Info: Adequate Hearing Info: Adequate Speech Info: Adequate    SPECIAL CARE FACTORS FREQUENCY   PT (By licensed PT), OT (By licensed OT)     PT Frequency: 5x a week OT Frequency: 5x a week            Contractures Contractures Info: Not present    Additional Factors Info  Code Status, Allergies Code Status Info: PARRTIAL Allergies Info: Amoxicillin; Lisinopril; Sulfacetamide; Tetracyclines & Related           Current Medications (01/15/2020):  This is the current hospital active medication list Current Facility-Administered Medications  Medication Dose Route Frequency Provider Last Rate Last Admin  . 0.9 %  sodium chloride infusion  250 mL Intravenous PRN Regalado, Belkys A, MD      . albuterol (PROVENTIL) (2.5 MG/3ML) 0.083% nebulizer solution 2.5 mg  2.5 mg Nebulization Q6H PRN 03/17/2020, MD      . arformoterol [facility]) nebulizer solution 15 mcg  15 mcg Nebulization BID Regalado, Belkys A, MD   15 mcg at 01/15/20 0741  . budesonide (PULMICORT) nebulizer solution 0.5 mg  0.5 mg Nebulization Daily Regalado, Belkys A, MD   0.5 mg at 01/15/20 0741  . calcium-vitamin D (OSCAL WITH D) 500-200 MG-UNIT per tablet 1 tablet  1 tablet Oral Daily Regalado, Belkys A, MD   1 tablet at 01/15/20 0942  . carvedilol (COREG) tablet 25 mg  25 mg Oral BID WC Regalado, Belkys A, MD   25 mg at 01/15/20 0943  . cyanocobalamin ((VITAMIN B-12)) injection 1,000 mcg  1,000 mcg Intramuscular Daily 03/17/20, MD   1,000 mcg at 01/15/20 0944  . doxycycline (  VIBRA-TABS) tablet 100 mg  100 mg Oral Q12H Marinda Elk, MD      . enoxaparin (LOVENOX) injection 40 mg  40 mg Subcutaneous Q24H Regalado, Belkys A, MD   40 mg at 01/14/20 2009  . guaiFENesin (MUCINEX) 12 hr tablet 600 mg  600 mg Oral BID Regalado, Belkys A, MD   600 mg at 01/15/20 0943  . hydrALAZINE (APRESOLINE) tablet 10 mg  10 mg Oral Q6H PRN Regalado, Belkys A, MD      . ipratropium-albuterol (DUONEB) 0.5-2.5 (3) MG/3ML nebulizer solution 3 mL  3 mL Nebulization TID Marinda Elk, MD   3 mL at 01/15/20 1455   . methylPREDNISolone sodium succinate (SOLU-MEDROL) 40 mg/mL injection 40 mg  40 mg Intravenous Q12H Regalado, Belkys A, MD   40 mg at 01/15/20 0947  . ondansetron (ZOFRAN) tablet 4 mg  4 mg Oral Q6H PRN Regalado, Belkys A, MD       Or  . ondansetron (ZOFRAN) injection 4 mg  4 mg Intravenous Q6H PRN Regalado, Belkys A, MD      . pantoprazole (PROTONIX) EC tablet 40 mg  40 mg Oral Q1200 Bowser, Grace E, NP   40 mg at 01/15/20 1440  . pravastatin (PRAVACHOL) tablet 10 mg  10 mg Oral Daily Regalado, Belkys A, MD   10 mg at 01/15/20 0942  . senna (SENOKOT) tablet 8.6 mg  1 tablet Oral BID Dellinger, Marianne L, PA-C   8.6 mg at 01/15/20 0943  . sodium chloride flush (NS) 0.9 % injection 3 mL  3 mL Intravenous Q12H Regalado, Belkys A, MD   3 mL at 01/15/20 0953  . sodium chloride flush (NS) 0.9 % injection 3 mL  3 mL Intravenous PRN Regalado, Belkys A, MD      . traMADol (ULTRAM) tablet 50 mg  50 mg Oral Q8H PRN Regalado, Belkys A, MD         Discharge Medications: Please see discharge summary for a list of discharge medications.  Relevant Imaging Results:  Relevant Lab Results:   Additional Information SSN [id number]  Dannette Barbara [place], [place]

## 2020-01-16 MED ORDER — POLYETHYLENE GLYCOL 3350 17 G PO PACK
17.0000 g | PACK | Freq: Two times a day (BID) | ORAL | Status: DC
  Administered 2020-01-16: 17 g via ORAL
  Filled 2020-01-16 (×3): qty 1

## 2020-01-16 MED ORDER — PREDNISONE 10 MG (21) PO TBPK
20.0000 mg | ORAL_TABLET | Freq: Every morning | ORAL | Status: AC
  Administered 2020-01-16: 20 mg via ORAL
  Filled 2020-01-16 (×2): qty 21

## 2020-01-16 MED ORDER — PREDNISONE 10 MG (21) PO TBPK: 20.0000 mg | ORAL_TABLET | Freq: Every evening | ORAL | Status: DC

## 2020-01-16 MED ORDER — PREDNISONE 10 MG (21) PO TBPK
20.0000 mg | ORAL_TABLET | Freq: Every evening | ORAL | Status: AC
  Administered 2020-01-16: 20 mg via ORAL

## 2020-01-16 MED ORDER — PREDNISONE 10 MG (21) PO TBPK
10.0000 mg | ORAL_TABLET | Freq: Three times a day (TID) | ORAL | Status: AC
  Administered 2020-01-17 (×3): 10 mg via ORAL

## 2020-01-16 MED ORDER — PREDNISONE 10 MG (21) PO TBPK: 10.0000 mg | ORAL_TABLET | Freq: Four times a day (QID) | ORAL | Status: DC

## 2020-01-16 MED ORDER — PREDNISONE 10 MG (21) PO TBPK
10.0000 mg | ORAL_TABLET | ORAL | Status: AC
  Administered 2020-01-16: 10 mg via ORAL

## 2020-01-16 NOTE — Progress Notes (Signed)
Chart reviewed.  Meet with patient at bedside.  She appears improved.  She has decided to go to SNF rather than home - which makes her MOST form even more important.  I asked if she would like to complete the MOST form. I have explained the form to her daughter and she has already filled out the choices - it simply needs to be signed.  She refuses and states she would like for it to be completed by Dr. Radonna Ricker tomorrow when her daughter is present.  Dr. Radonna Ricker aware.  PMT will sign off.  Please call us back if we can be of assistance to Rebecca [name].  Rebecca Richards, PA-C Palliative Medicine Office:  [phone]  15 min.

## 2020-01-16 NOTE — Progress Notes (Signed)
[facility] PROGRESS NOTE    Progress Note  Bhumi Godbey  VFI:[id number] DOB: 03-Aug-1940 DOA: 01/12/2020 PCP: Farris Has, MD     Brief Narrative:   Rebecca [name] is an 79 y.o. female past medical history significant of for COPD on 6 L of oxygen heart failure comes in complaining of generalized weakness tremor and shortness of breath.  ABG was done this yields a PCO2 of 82  Assessment/Plan:   Acute on Chronic respiratory failure with hypoxia due to COPD exacerbation: Change steroids and antibiotics to oral. Continue inhalers. Physical therapy evaluated the patient the recommended skilled nursing facility. The patient is medically stable for transfer awaiting placement.  Is currently awaiting insurance authorization.  Tremors: Likely due to hypercarbia.  Vitamin B12 deficiency B12 169  Replete B12 intramuscularly.  Essential hypertension: Continue current home regimen.  Headache/acute encephalopathy: MRI negative question due to hypoxia.  New constipation: P.o. twice daily for 2 days.    DVT prophylaxis: lovenox Family Communication:none Status is: Observation  The patient will require care spanning > 2 midnights and should be moved to inpatient because: She is hemodynamically stable waiting for insurance authorization for skilled nursing facility placement.  Dispo: The patient is from: ALF              Anticipated d/c is to: SNF              Anticipated d/c date is: 2 days              Patient currently is  medically stable to d/c.     Code Status:     Code Status Orders  (From admission, onward)         Start     Ordered   01/12/20 2008  Limited resuscitation (code)  Continuous       Question Answer Comment  In the event of cardiac or respiratory ARREST: Initiate Code Blue, Call Rapid Response Yes   In the event of cardiac or respiratory ARREST: Perform CPR Yes   In the event of cardiac or respiratory ARREST: Perform  Intubation/Mechanical Ventilation No   In the event of cardiac or respiratory ARREST: Use NIPPV/BiPAp only if indicated Yes   In the event of cardiac or respiratory ARREST: Administer ACLS medications if indicated Yes   In the event of cardiac or respiratory ARREST: Perform Defibrillation or Cardioversion if indicated Yes      01/12/20 2008        Code Status History    Date Active Date Inactive Code Status Order ID Comments User Context   01/12/2020 1738 01/12/2020 2008 Partial Code [id number]  Alba Cory, MD ED   Advance Care Planning Activity        IV Access:    Peripheral IV   Procedures and diagnostic studies:   No results found.   Medical Consultants:    None.  Anti-Infectives:  Doxycycline   Subjective:    [name] she relates she has not had a bowel movement in over 4 days.  Objective:    Vitals:   01/16/20 0300 01/16/20 0500 01/16/20 0734 01/16/20 0749  BP:    (!) 163/80  Pulse:      Resp:    19  Temp:    98.7 F (37.1 C)  TempSrc:    Oral  SpO2: 96% 93% 91% 98%  Weight:      Height:       SpO2: 98 % O2 Flow Rate (L/min):  5 L/min   Intake/Output Summary (Last 24 hours) at 01/16/2020 0827 Last data filed at 01/16/2020 0500 Gross per 24 hour  Intake 183 ml  Output 750 ml  Net -567 ml   Filed Weights   01/15/20 2000  Weight: 70.3 kg    Exam: General exam: In no acute distress. Respiratory system: Good air movement and clear to auscultation. Cardiovascular system: S1 & S2 heard, RRR. No JVD. Gastrointestinal system: Abdomen is nondistended, soft and nontender.  Extremities: No pedal edema. Skin: No rashes, lesions or ulcers Psychiatry: Judgement and insight appear normal. Mood & affect appropriate. Data Reviewed:    Labs: Basic Metabolic Panel: Recent Labs  Lab 01/12/20 1134 01/12/20 1256 01/12/20 1320 01/12/20 1320 01/13/20 0330 01/13/20 0527 01/13/20 1457  NA 137  --  135  --  131* 135  --   K 5.9*    < > 4.9   < > 3.9 4.2  --   CL 89*  --   --   --   --  86*  --   CO2 40*  --   --   --   --  36*  --   GLUCOSE 116*  --   --   --   --  147*  --   BUN 14  --   --   --   --  16  --   CREATININE 0.51  --   --   --   --  0.68  --   CALCIUM 9.3  --   --   --   --  9.6  --   MG  --   --   --   --   --   --  1.7   < > = values in this interval not displayed.   GFR Estimated Creatinine Clearance: 52.4 mL/min (by C-G formula based on SCr of 0.68 mg/dL). Liver Function Tests: Recent Labs  Lab 01/12/20 1134 01/13/20 0527  AST 24 26  ALT 17 22  ALKPHOS 47 49  BILITOT 1.0 1.1  PROT 6.2* 6.5  ALBUMIN 3.5 3.7   No results for input(s): LIPASE, AMYLASE in the last 168 hours. Recent Labs  Lab 01/12/20 1256 01/13/20 0527  AMMONIA 19 12   Coagulation profile Recent Labs  Lab 01/12/20 1134  INR 0.9   COVID-19 Labs  No results for input(s): DDIMER, FERRITIN, LDH, CRP in the last 72 hours.  Lab Results  Component Value Date   SARSCOV2NAA NEGATIVE 01/12/2020   SARSCOV2NAA NEGATIVE 09/28/2019    CBC: Recent Labs  Lab 01/12/20 1134 01/12/20 1320 01/13/20 0330 01/13/20 0527  WBC 6.1  --   --  6.2  NEUTROABS 4.4  --   --   --   HGB 12.3 13.3 14.6 12.4  HCT 41.1 39.0 43.0 40.7  MCV 103.3*  --   --  98.3  PLT 186  --   --  192   Cardiac Enzymes: No results for input(s): CKTOTAL, CKMB, CKMBINDEX, TROPONINI in the last 168 hours. BNP (last 3 results) No results for input(s): PROBNP in the last 8760 hours. CBG: No results for input(s): GLUCAP in the last 168 hours. D-Dimer: No results for input(s): DDIMER in the last 72 hours. Hgb A1c: No results for input(s): HGBA1C in the last 72 hours. Lipid Profile: No results for input(s): CHOL, HDL, LDLCALC, TRIG, CHOLHDL, LDLDIRECT in the last 72 hours. Thyroid function studies: No results for input(s): TSH, T4TOTAL, T3FREE, THYROIDAB in the last  72 hours.  Invalid input(s): FREET3 Anemia work up: No results for input(s):  VITAMINB12, FOLATE, FERRITIN, TIBC, IRON, RETICCTPCT in the last 72 hours. Sepsis Labs: Recent Labs  Lab 01/12/20 1134 01/13/20 0527  WBC 6.1 6.2   Microbiology Recent Results (from the past 240 hour(s))  Urine culture     Status: Abnormal   Collection Time: 01/12/20 11:34 AM   Specimen: Urine, Random  Result Value Ref Range Status   Specimen Description URINE, RANDOM  Final   Special Requests   Final    NONE Performed at [facility], [address] [place], [place] [address]    Culture >=100,000 COLONIES/mL VIRIDANS STREPTOCOCCUS (A)  Final   Report Status 01/15/2020 FINAL  Final  SARS Coronavirus 2 by RT PCR (hospital order, performed in [facility] hospital lab) Nasopharyngeal Nasopharyngeal Swab     Status: None   Collection Time: 01/12/20  9:25 PM   Specimen: Nasopharyngeal Swab  Result Value Ref Range Status   SARS Coronavirus 2 NEGATIVE NEGATIVE Final    Comment: (NOTE) SARS-CoV-2 target nucleic acids are NOT DETECTED.  The SARS-CoV-2 RNA is generally detectable in upper and lower respiratory specimens during the acute phase of infection. The lowest concentration of SARS-CoV-2 viral copies this assay can detect is 250 copies / mL. A negative result does not preclude SARS-CoV-2 infection and should not be used as the sole basis for treatment or other patient management decisions.  A negative result may occur with improper specimen collection / handling, submission of specimen other than nasopharyngeal swab, presence of viral mutation(s) within the areas targeted by this assay, and inadequate number of viral copies (<250 copies / mL). A negative result must be combined with clinical observations, patient history, and epidemiological information.  Fact Sheet for Patients:   BoilerBrush.com.cy  Fact Sheet for Healthcare Providers: https://pope.com/  This test is not yet approved or  cleared by the [place]  FDA and has been authorized for detection and/or diagnosis of SARS-CoV-2 by FDA under an Emergency Use Authorization (EUA).  This EUA will remain in effect (meaning this test can be used) for the duration of the COVID-19 declaration under Section 564(b)(1) of the Act, 21 U.S.C. section 360bbb-3(b)(1), unless the authorization is terminated or revoked sooner.  Performed at [facility], [address], [place], [place] [address]      Medications:    arformoterol  15 mcg Nebulization BID   budesonide  0.5 mg Nebulization Daily   calcium-vitamin D  1 tablet Oral Daily   carvedilol  25 mg Oral BID WC   cyanocobalamin  1,000 mcg Intramuscular Daily   doxycycline  100 mg Oral Q12H   enoxaparin (LOVENOX) injection  40 mg Subcutaneous Q24H   guaiFENesin  600 mg Oral BID   ipratropium-albuterol  3 mL Nebulization TID   methylPREDNISolone (SOLU-MEDROL) injection  40 mg Intravenous Q12H   pantoprazole  40 mg Oral Q1200   pravastatin  10 mg Oral Daily   senna  1 tablet Oral BID   sodium chloride flush  3 mL Intravenous Q12H   Continuous Infusions:  sodium chloride        LOS: 3 days   Marinda Elk  [facility]  01/16/2020, 8:27 AM

## 2020-01-16 NOTE — Progress Notes (Signed)
Patient has refused CPAP for tonight.  No distress noted at this time.  Patient sat at 94% on HFNC ([place]) 5L.

## 2020-01-16 NOTE — Plan of Care (Signed)
Discussed with patient plan of care for the evening, pain management and when she wanted to get back in bed from the chair with some teach back displayed

## 2020-01-17 DIAGNOSIS — Z515 Encounter for palliative care: Secondary | ICD-10-CM

## 2020-01-17 MED ORDER — PREDNISONE 10 MG PO TABS
ORAL_TABLET | ORAL | 0 refills | Status: DC
Start: 2020-01-17 — End: 2020-02-08

## 2020-01-17 NOTE — Progress Notes (Signed)
[facility] PROGRESS NOTE    Progress Note  Rebecca [name]  YIR:[id number] DOB: 1940/08/28 DOA: 01/12/2020 PCP: Farris Has, MD     Brief Narrative:   Rebecca [name] is an 79 y.o. female past medical history significant of for COPD on 6 L of oxygen heart failure comes in complaining of generalized weakness tremor and shortness of breath.  ABG was done this yields a PCO2 of 82  Assessment/Plan:   Acute on Chronic respiratory failure with hypoxia due to COPD exacerbation: Change steroids and antibiotics to oral. Continue inhalers. Physical therapy evaluated the patient the recommended skilled nursing facility. The patient is medically stable for transfer awaiting placement.  Is currently awaiting insurance authorization.  Tremors: Likely due to hypercarbia.  Vitamin B12 deficiency B12 169  Replete B12 intramuscularly.  Essential hypertension: Continue current home regimen.  Headache/acute encephalopathy: MRI negative question due to hypoxia.  Constipation: Change MiraLAX to as needed.     DVT prophylaxis: lovenox Family Communication:none Status is: Observation  The patient will require care spanning > 2 midnights and should be moved to inpatient because: She is hemodynamically stable waiting for insurance authorization for skilled nursing facility placement.  Dispo: The patient is from: ALF              Anticipated d/c is to: SNF              Anticipated d/c date is: 2 days              Patient currently is  medically stable to d/c.     Code Status:     Code Status Orders  (From admission, onward)         Start     Ordered   01/12/20 2008  Limited resuscitation (code)  Continuous       Question Answer Comment  In the event of cardiac or respiratory ARREST: Initiate Code Blue, Call Rapid Response Yes   In the event of cardiac or respiratory ARREST: Perform CPR Yes   In the event of cardiac or respiratory ARREST: Perform  Intubation/Mechanical Ventilation No   In the event of cardiac or respiratory ARREST: Use NIPPV/BiPAp only if indicated Yes   In the event of cardiac or respiratory ARREST: Administer ACLS medications if indicated Yes   In the event of cardiac or respiratory ARREST: Perform Defibrillation or Cardioversion if indicated Yes      01/12/20 2008        Code Status History    Date Active Date Inactive Code Status Order ID Comments User Context   01/12/2020 1738 01/12/2020 2008 Partial Code [id number]  Alba Cory, MD ED   Advance Care Planning Activity        IV Access:    Peripheral IV   Procedures and diagnostic studies:   No results found.   Medical Consultants:    None.  Anti-Infectives:  Doxycycline   Subjective:    [name] no new complaints.  Objective:    Vitals:   01/16/20 2009 01/16/20 2048 01/17/20 0753 01/17/20 0830  BP: (!) 146/67  (!) 144/76   Pulse: 84  83 74  Resp: 14  16 20   Temp: 98.4 F (36.9 C)  98.3 F (36.8 C)   TempSrc: Oral     SpO2: 97% 94% 96% 96%  Weight:      Height:       SpO2: 96 % O2 Flow Rate (L/min): 4 L/min   Intake/Output Summary (Last  24 hours) at 01/17/2020 1105 Last data filed at 01/17/2020 0500 Gross per 24 hour  Intake 120 ml  Output 1300 ml  Net -1180 ml   Filed Weights   01/15/20 2000  Weight: 70.3 kg    Exam: General exam: In no acute distress. Respiratory system: Good air movement and clear to auscultation. Cardiovascular system: S1 & S2 heard, RRR. No JVD. Gastrointestinal system: Abdomen is nondistended, soft and nontender.  Extremities: No pedal edema. Skin: No rashes, lesions or ulcers Psychiatry: Judgement and insight appear normal. Mood & affect appropriate. Data Reviewed:    Labs: Basic Metabolic Panel: Recent Labs  Lab 01/12/20 1134 01/12/20 1256 01/12/20 1320 01/12/20 1320 01/13/20 0330 01/13/20 0527 01/13/20 1457  NA 137  --  135  --  131* 135  --   K 5.9*   < >  4.9   < > 3.9 4.2  --   CL 89*  --   --   --   --  86*  --   CO2 40*  --   --   --   --  36*  --   GLUCOSE 116*  --   --   --   --  147*  --   BUN 14  --   --   --   --  16  --   CREATININE 0.51  --   --   --   --  0.68  --   CALCIUM 9.3  --   --   --   --  9.6  --   MG  --   --   --   --   --   --  1.7   < > = values in this interval not displayed.   GFR Estimated Creatinine Clearance: 52.4 mL/min (by C-G formula based on SCr of 0.68 mg/dL). Liver Function Tests: Recent Labs  Lab 01/12/20 1134 01/13/20 0527  AST 24 26  ALT 17 22  ALKPHOS 47 49  BILITOT 1.0 1.1  PROT 6.2* 6.5  ALBUMIN 3.5 3.7   No results for input(s): LIPASE, AMYLASE in the last 168 hours. Recent Labs  Lab 01/12/20 1256 01/13/20 0527  AMMONIA 19 12   Coagulation profile Recent Labs  Lab 01/12/20 1134  INR 0.9   COVID-19 Labs  No results for input(s): DDIMER, FERRITIN, LDH, CRP in the last 72 hours.  Lab Results  Component Value Date   SARSCOV2NAA NEGATIVE 01/12/2020   SARSCOV2NAA NEGATIVE 09/28/2019    CBC: Recent Labs  Lab 01/12/20 1134 01/12/20 1320 01/13/20 0330 01/13/20 0527  WBC 6.1  --   --  6.2  NEUTROABS 4.4  --   --   --   HGB 12.3 13.3 14.6 12.4  HCT 41.1 39.0 43.0 40.7  MCV 103.3*  --   --  98.3  PLT 186  --   --  192   Cardiac Enzymes: No results for input(s): CKTOTAL, CKMB, CKMBINDEX, TROPONINI in the last 168 hours. BNP (last 3 results) No results for input(s): PROBNP in the last 8760 hours. CBG: No results for input(s): GLUCAP in the last 168 hours. D-Dimer: No results for input(s): DDIMER in the last 72 hours. Hgb A1c: No results for input(s): HGBA1C in the last 72 hours. Lipid Profile: No results for input(s): CHOL, HDL, LDLCALC, TRIG, CHOLHDL, LDLDIRECT in the last 72 hours. Thyroid function studies: No results for input(s): TSH, T4TOTAL, T3FREE, THYROIDAB in the last 72 hours.  Invalid input(s): FREET3 Anemia  work up: No results for input(s):  VITAMINB12, FOLATE, FERRITIN, TIBC, IRON, RETICCTPCT in the last 72 hours. Sepsis Labs: Recent Labs  Lab 01/12/20 1134 01/13/20 0527  WBC 6.1 6.2   Microbiology Recent Results (from the past 240 hour(s))  Urine culture     Status: Abnormal   Collection Time: 01/12/20 11:34 AM   Specimen: Urine, Random  Result Value Ref Range Status   Specimen Description URINE, RANDOM  Final   Special Requests   Final    NONE Performed at [facility], [address], [place], [place] [address]    Culture >=100,000 COLONIES/mL VIRIDANS STREPTOCOCCUS (A)  Final   Report Status 01/15/2020 FINAL  Final  SARS Coronavirus 2 by RT PCR (hospital order, performed in [facility] hospital lab) Nasopharyngeal Nasopharyngeal Swab     Status: None   Collection Time: 01/12/20  9:25 PM   Specimen: Nasopharyngeal Swab  Result Value Ref Range Status   SARS Coronavirus 2 NEGATIVE NEGATIVE Final    Comment: (NOTE) SARS-CoV-2 target nucleic acids are NOT DETECTED.  The SARS-CoV-2 RNA is generally detectable in upper and lower respiratory specimens during the acute phase of infection. The lowest concentration of SARS-CoV-2 viral copies this assay can detect is 250 copies / mL. A negative result does not preclude SARS-CoV-2 infection and should not be used as the sole basis for treatment or other patient management decisions.  A negative result may occur with improper specimen collection / handling, submission of specimen other than nasopharyngeal swab, presence of viral mutation(s) within the areas targeted by this assay, and inadequate number of viral copies (<250 copies / mL). A negative result must be combined with clinical observations, patient history, and epidemiological information.  Fact Sheet for Patients:   BoilerBrush.com.cy  Fact Sheet for Healthcare Providers: https://pope.com/  This test is not yet approved or  cleared by the [place]  FDA and has been authorized for detection and/or diagnosis of SARS-CoV-2 by FDA under an Emergency Use Authorization (EUA).  This EUA will remain in effect (meaning this test can be used) for the duration of the COVID-19 declaration under Section 564(b)(1) of the Act, 21 U.S.C. section 360bbb-3(b)(1), unless the authorization is terminated or revoked sooner.  Performed at [facility], [address] [place], [place] [address]      Medications:   . arformoterol  15 mcg Nebulization BID  . budesonide  0.5 mg Nebulization Daily  . calcium-vitamin D  1 tablet Oral Daily  . carvedilol  25 mg Oral BID WC  . cyanocobalamin  1,000 mcg Intramuscular Daily  . enoxaparin (LOVENOX) injection  40 mg Subcutaneous Q24H  . guaiFENesin  600 mg Oral BID  . ipratropium-albuterol  3 mL Nebulization TID  . pantoprazole  40 mg Oral Q1200  . polyethylene glycol  17 g Oral BID  . pravastatin  10 mg Oral Daily  . predniSONE  10 mg Oral 3 x daily with food  . [START ON 01/18/2020] predniSONE  10 mg Oral 4X daily taper  . predniSONE  20 mg Oral Nightly  . senna  1 tablet Oral BID  . sodium chloride flush  3 mL Intravenous Q12H   Continuous Infusions: . sodium chloride        LOS: 4 days   Marinda Elk  [facility]  01/17/2020, 11:05 AM

## 2020-01-17 NOTE — TOC Progression Note (Signed)
Transition of Care [facility]) - Progression Note    Patient Details  Name: Rebecca [name] MRN: [id number] Date of Birth: 1941/05/25  Transition of Care [facility]) CM/SW Contact  Beckie Busing, RN Phone Number: [phone] 01/17/2020, 2:01 PM  Clinical Narrative:    CM at bedside to speak with patient about discharge plans. Patient states that she would like to go back to independent living at [facility]. Patient states that she was previously active with [facility] for Home health therapy and has home O2.. Patient states that she has transportation home and a portable O2 tank at bedside, she just needs to know what the MD is going to do. Message sent to MD to determine if he is planning to discharge patient back to independent living since patient wants to go back there versus snf. Home health with [facility] can resume upon discharge. CM will continue to follow.   Expected Discharge Plan: Skilled Nursing Facility Barriers to Discharge: (P) No Barriers Identified  Expected Discharge Plan and Services Expected Discharge Plan: Skilled Nursing Facility       Living arrangements for the past 2 months: Independent Living Facility Expected Discharge Date: 01/15/20                         [facility] Arranged: OT, PT, Respirator Therapy   Date HH Agency Contacted: 01/15/20 Time HH Agency Contacted: 1238 Representative spoke with at [facility] Agency: Benjaman Kindler w [facility]   Social Determinants of Health (SDOH) Interventions    Readmission Risk Interventions No flowsheet data found.

## 2020-01-17 NOTE — Discharge Summary (Signed)
Physician Discharge Summary  Rebecca [name] HWT:[id number] DOB: 1941/05/05 DOA: 01/12/2020  PCP: Farris Has, MD  Admit date: 01/12/2020 Discharge date: 01/17/2020  Admitted From: Home Disposition:  ALF  Recommendations for Outpatient Follow-up:  1. Follow up with Pulmonary in 1-2 weeks 2. Please obtain BMP/CBC in one week 3.   Home Health:Yes Equipment/Devices:Yes  Discharge Condition:Stable CODE STATUS:DNR  Diet recommendation: Heart Healthy  Brief/Interim Summary: 79 y.o. female past medical history significant of for COPD on 6 L of oxygen heart failure comes in complaining of generalized weakness tremor and shortness of breath.  ABG was done this yields a PCO2 of 82  Discharge Diagnoses:  Active Problems:   Chronic respiratory failure with hypoxia (HCC)   COPD with chronic bronchitis and emphysema (HCC)   Nonischemic cardiomyopathy (HCC)   COPD exacerbation (HCC)   Acute respiratory failure with hypoxia (HCC)   Chronic obstructive pulmonary disease with acute exacerbation (HCC)   Palliative care encounter   Goals of care, counseling/discussion  Acute on chronic respiratory failure with hypoxia and hypercapnia due to COPD exacerbation: Started on IV steroids antibiotics and inhalers. CT angio of the chest showed no PE or infiltrates. She was deescalated to oral steroids which she will finish a taper as an outpatient. Palliative  care met with family and she chose to be a DNR but continue full scope of treatment. Physical therapy evaluated the patient and recommended skilled nursing facility, the patient and daughter were adamantly refusing skilled nursing facility after explaining the risk and benefits to them they understand and are willing to take the risk. She will go to assisted living facility with physical therapy.  Tremors: Likely due to hypercarbia now resolved.  Vitamin B12 deficiency: She completed a 7-day course of intramuscular vitamin B12  deficiency.  Essentiall hypertension: No changes made to her medication.  Acute metabolic encephalopathy: MRI of the brain negative likely due to hypoxia and hypercarbia.  Discharge Instructions  Discharge Instructions    Diet - low sodium heart healthy   Complete by: As directed    Increase activity slowly   Complete by: As directed      Allergies as of 01/17/2020      Reactions   Amoxicillin Rash   Lisinopril Rash   Sulfacetamide Rash   Tetracyclines & Related Rash      Medication List    STOP taking these medications   azithromycin 250 MG tablet Commonly known as: ZITHROMAX     TAKE these medications   acetaminophen 500 MG tablet Commonly known as: TYLENOL Take 1,000 mg by mouth 2 (two) times daily as needed for mild pain or headache.   albuterol (2.5 MG/3ML) 0.083% nebulizer solution Commonly known as: PROVENTIL PLEASE SEE ATTACHED FOR DETAILED DIRECTIONS What changed:   how much to take  how to take this  when to take this  additional instructions   albuterol 108 (90 Base) MCG/ACT inhaler Commonly known as: VENTOLIN HFA Inhale 1-2 puffs into the lungs every 6 (six) hours as needed for wheezing or shortness of breath. What changed: Another medication with the same name was changed. Make sure you understand how and when to take each.   arformoterol 15 MCG/2ML Nebu Commonly known as: BROVANA Take 2 mLs (15 mcg total) by nebulization 2 (two) times daily.   budesonide 0.5 MG/2ML nebulizer solution Commonly known as: Pulmicort Take 2 mLs (0.5 mg total) by nebulization daily.   Calcium 600+D 600-200 MG-UNIT Tabs Generic drug: Calcium Carbonate-Vitamin D Take 1 tablet  by mouth daily.   carvedilol 25 MG tablet Commonly known as: COREG Take 25 mg by mouth 2 (two) times daily.   doxycycline 100 MG tablet Commonly known as: VIBRA-TABS Take 1 tablet (100 mg total) by mouth every 12 (twelve) hours for 3 days.   estradiol 0.1 MG/GM vaginal  cream Commonly known as: ESTRACE Place 1 Applicatorful vaginally every other day.   fluticasone 50 MCG/ACT nasal spray Commonly known as: FLONASE Place 2 sprays into both nostrils daily as needed for allergies or rhinitis.   metFORMIN 500 MG tablet Commonly known as: GLUCOPHAGE Take 500 mg by mouth daily with supper.   pravastatin 10 MG tablet Commonly known as: PRAVACHOL Take 10 mg by mouth daily.   predniSONE 10 MG tablet Commonly known as: DELTASONE Takes 6 tablets for 1 days, then 5 tablets for 1 days, then 4 tablets for 1 days, then 3 tablets for 1 days, then 2 tabs for 1 days, then 1 tab for 1 days, and then stop.   predniSONE 10 MG tablet Commonly known as: DELTASONE Takes 6 tablets for 1 days, then 5 tablets for 1 days, then 4 tablets for 1 days, then 3 tablets for 1 days, then 2 tabs for 1 days, then 1 tab for 1 days, and then stop.       Follow-up Information    Parrett, Fonnie Mu, NP Follow up on 02/08/2020.   Specialty: Pulmonary Disease Why: 1130 Contact information: [address] [place] [address] [place]. Follow up.   Specialty: Physical Therapy Why: Your home health will resume with the agency listed above. If you have any questions or concerns please call the number listed above.  Contact information: [address] [place] [address] [phone]              Allergies  Allergen Reactions  . Amoxicillin Rash  . Lisinopril Rash  . Sulfacetamide Rash  . Tetracyclines & Related Rash    Consultations:  Pulmonary and critical care   Procedures/Studies: CT Head Wo Contrast  Result Date: 01/12/2020 CLINICAL DATA:  Focal neuro deficit, greater than 6 hours, stroke suspected. Additional provided: Altered mental status since yesterday. Tremors. EXAM: CT HEAD WITHOUT CONTRAST TECHNIQUE: Contiguous axial images were obtained from the base of the skull through the vertex  without intravenous contrast. COMPARISON:  No pertinent prior studies available for comparison. FINDINGS: Brain: There is mild generalized parenchymal atrophy. Mild ill-defined hypoattenuation within the cerebral white matter is nonspecific, but consistent with chronic small vessel ischemic disease. There is no acute intracranial hemorrhage. No demarcated cortical infarct is identified. No extra-axial fluid collection. No evidence of intracranial mass. No midline shift. Vascular: No hyperdense vessel.  Atherosclerotic calcifications. Skull: Normal. Negative for fracture or focal lesion. Sinuses/Orbits: Visualized orbits show no acute finding. Moderate-sized left maxillary sinus mucous retention cyst. Mild ethmoid and right sphenoid sinus mucosal thickening. Right mastoid effusion. IMPRESSION: No CT evidence of acute intracranial abnormality. Mild generalized parenchymal atrophy and chronic small vessel ischemic disease. Mild ethmoid and right sphenoid sinus mucosal thickening. Left maxillary sinus mucous retention cyst. Right mastoid effusion. Electronically Signed   By: Kellie Simmering DO   On: 01/12/2020 13:59   CT ANGIO CHEST PE W OR WO CONTRAST  Result Date: 01/12/2020 CLINICAL DATA:  Altered mental status. EXAM: CT ANGIOGRAPHY CHEST WITH CONTRAST TECHNIQUE: Multidetector CT imaging of the chest was performed using the standard protocol  during bolus administration of intravenous contrast. Multiplanar CT image reconstructions and MIPs were obtained to evaluate the vascular anatomy. CONTRAST:  31m OMNIPAQUE IOHEXOL 350 MG/ML SOLN COMPARISON:  September 28, 2019. FINDINGS: Cardiovascular: Satisfactory opacification of the pulmonary arteries to the segmental level. No evidence of pulmonary embolism. Normal heart size. No pericardial effusion. Coronary artery calcifications are noted. Atherosclerosis of thoracic aorta is noted without aneurysm formation. Mediastinum/Nodes: Small sliding-type hiatal hernia is noted. No  adenopathy is noted. Thyroid gland is unremarkable. Lungs/Pleura: No pneumothorax or pleural effusion is noted. 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 Mild bilateral posterior basilar subsegmental atelectasis is noted. Upper Abdomen: Bilateral nephrolithiasis is noted. Musculoskeletal: No chest wall abnormality. No acute or significant osseous findings. Review of the MIP images confirms the above findings. IMPRESSION: 1. No definite evidence of pulmonary embolus. 2. Coronary artery calcifications are noted suggesting coronary artery disease. 3. Small sliding-type hiatal hernia. 4. Bilateral nephrolithiasis. Aortic Atherosclerosis (ICD10-I70.0) and Emphysema (ICD10-J43.9). Electronically Signed   By: JMarijo ConceptionM.D.   On: 01/12/2020 20:15   MR BRAIN WO CONTRAST  Result Date: 01/12/2020 CLINICAL DATA:  Altered mental status beginning yesterday.  Tremors. EXAM: MRI HEAD WITHOUT CONTRAST TECHNIQUE: Multiplanar, multiecho pulse sequences of the brain and surrounding structures were obtained without intravenous contrast. COMPARISON:  Head CT same day FINDINGS: Brain: Diffusion imaging does not show any acute or subacute infarction or other cause of restricted diffusion. Chronic small-vessel ischemic changes affect the pons. No focal cerebellar finding. Cerebral hemispheres show mild chronic small-vessel ischemic change of the white matter, often seen at this age. No cortical or large vessel territory infarction. No mass lesion, hemorrhage, hydrocephalus or extra-axial collection. Vascular: Major vessels at the base of the brain show flow. Skull and upper cervical spine: Negative Sinuses/Orbits: Retention cyst left maxillary sinus. No sign of sinusitis. Orbits negative. Other: Mastoid effusions, more extensive on the right than the left. IMPRESSION: No acute intracranial finding. Age related atrophy. Mild chronic small-vessel ischemic change of the pons and cerebral hemispheric white matter, often  seen at this age. Mastoid effusions right more than left. Electronically Signed   By: MNelson ChimesM.D.   On: 01/12/2020 15:59   DG Chest Portable 1 View  Result Date: 01/12/2020 CLINICAL DATA:  Shortness of breath. EXAM: PORTABLE CHEST 1 VIEW COMPARISON:  09/28/2019 FINDINGS: The heart size is within normal limits. Ectasia and atherosclerotic calcification of thoracic aorta remains stable. Pulmonary emphysema again noted. Both lungs are clear. IMPRESSION: Emphysema. No active disease. Electronically Signed   By: JMarlaine HindM.D.   On: 01/12/2020 12:02   (Echo, Carotid, EGD, Colonoscopy, ERCP)    Subjective: Relates her breathing is back to baseline  Discharge Exam: Vitals:   01/17/20 1430 01/17/20 1641  BP:  (!) 161/76  Pulse: 76 80  Resp: 20 16  Temp:  98.8 F (37.1 C)  SpO2: 98% 97%   Vitals:   01/17/20 0753 01/17/20 0830 01/17/20 1430 01/17/20 1641  BP: (!) 144/76   (!) 161/76  Pulse: 83 74 76 80  Resp: 16 20 20 16   Temp: 98.3 F (36.8 C)   98.8 F (37.1 C)  TempSrc:      SpO2: 96% 96% 98% 97%  Weight:      Height:        General: Pt is alert, awake, not in acute distress Cardiovascular: RRR, S1/S2 +, no rubs, no gallops Respiratory: CTA bilaterally, no wheezing, no rhonchi Abdominal: Soft, NT, ND, bowel sounds +  Extremities: no edema, no cyanosis    The results of significant diagnostics from this hospitalization (including imaging, microbiology, ancillary and laboratory) are listed below for reference.     Microbiology: Recent Results (from the past 240 hour(s))  Urine culture     Status: Abnormal   Collection Time: 01/12/20 11:34 AM   Specimen: Urine, Random  Result Value Ref Range Status   Specimen Description URINE, RANDOM  Final   Special Requests   Final    NONE Performed at [facility], [address] [place], [place] [address]    Culture >=100,000 COLONIES/mL VIRIDANS STREPTOCOCCUS (A)  Final   Report Status 01/15/2020 FINAL  Final   SARS Coronavirus 2 by RT PCR (hospital order, performed in [facility] hospital lab) Nasopharyngeal Nasopharyngeal Swab     Status: None   Collection Time: 01/12/20  9:25 PM   Specimen: Nasopharyngeal Swab  Result Value Ref Range Status   SARS Coronavirus 2 NEGATIVE NEGATIVE Final    Comment: (NOTE) SARS-CoV-2 target nucleic acids are NOT DETECTED.  The SARS-CoV-2 RNA is generally detectable in upper and lower respiratory specimens during the acute phase of infection. The lowest concentration of SARS-CoV-2 viral copies this assay can detect is 250 copies / mL. A negative result does not preclude SARS-CoV-2 infection and should not be used as the sole basis for treatment or other patient management decisions.  A negative result may occur with improper specimen collection / handling, submission of specimen other than nasopharyngeal swab, presence of viral mutation(s) within the areas targeted by this assay, and inadequate number of viral copies (<250 copies / mL). A negative result must be combined with clinical observations, patient history, and epidemiological information.  Fact Sheet for Patients:   StrictlyIdeas.no  Fact Sheet for Healthcare Providers: BankingDealers.co.za  This test is not yet approved or  cleared by the [place] FDA and has been authorized for detection and/or diagnosis of SARS-CoV-2 by FDA under an Emergency Use Authorization (EUA).  This EUA will remain in effect (meaning this test can be used) for the duration of the COVID-19 declaration under Section 564(b)(1) of the Act, 21 U.S.C. section 360bbb-3(b)(1), unless the authorization is terminated or revoked sooner.  Performed at [facility], [place] [address] [place], [place] [address]      Labs: BNP (last 3 results) Recent Labs    09/28/19 1039 01/12/20 1135  BNP 124.5* 97.9   Basic Metabolic Panel: Recent Labs  Lab 01/12/20 1134  01/12/20 1256 01/12/20 1320 01/13/20 0330 01/13/20 0527 01/13/20 1457  NA 137  --  135 131* 135  --   K 5.9* 5.0 4.9 3.9 4.2  --   CL 89*  --   --   --  86*  --   CO2 40*  --   --   --  36*  --   GLUCOSE 116*  --   --   --  147*  --   BUN 14  --   --   --  16  --   CREATININE 0.51  --   --   --  0.68  --   CALCIUM 9.3  --   --   --  9.6  --   MG  --   --   --   --   --  1.7   Liver Function Tests: Recent Labs  Lab 01/12/20 1134 01/13/20 0527  AST 24 26  ALT 17 22  ALKPHOS 47 49  BILITOT 1.0 1.1  PROT 6.2* 6.5  ALBUMIN 3.5 3.7   No results for input(s): LIPASE, AMYLASE in the last 168 hours. Recent Labs  Lab 01/12/20 1256 01/13/20 0527  AMMONIA 19 12   CBC: Recent Labs  Lab 01/12/20 1134 01/12/20 1320 01/13/20 0330 01/13/20 0527  WBC 6.1  --   --  6.2  NEUTROABS 4.4  --   --   --   HGB 12.3 13.3 14.6 12.4  HCT 41.1 39.0 43.0 40.7  MCV 103.3*  --   --  98.3  PLT 186  --   --  192   Cardiac Enzymes: No results for input(s): CKTOTAL, CKMB, CKMBINDEX, TROPONINI in the last 168 hours. BNP: Invalid input(s): POCBNP CBG: No results for input(s): GLUCAP in the last 168 hours. D-Dimer No results for input(s): DDIMER in the last 72 hours. Hgb A1c No results for input(s): HGBA1C in the last 72 hours. Lipid Profile No results for input(s): CHOL, HDL, LDLCALC, TRIG, CHOLHDL, LDLDIRECT in the last 72 hours. Thyroid function studies No results for input(s): TSH, T4TOTAL, T3FREE, THYROIDAB in the last 72 hours.  Invalid input(s): FREET3 Anemia work up No results for input(s): VITAMINB12, FOLATE, FERRITIN, TIBC, IRON, RETICCTPCT in the last 72 hours. Urinalysis    Component Value Date/Time   COLORURINE YELLOW 01/12/2020 1256   APPEARANCEUR HAZY (A) 01/12/2020 1256   LABSPEC 1.008 01/12/2020 1256   PHURINE 6.0 01/12/2020 1256   GLUCOSEU NEGATIVE 01/12/2020 1256   HGBUR SMALL (A) 01/12/2020 1256   BILIRUBINUR NEGATIVE 01/12/2020 1256   KETONESUR 5 (A)  01/12/2020 1256   PROTEINUR NEGATIVE 01/12/2020 1256   NITRITE NEGATIVE 01/12/2020 1256   LEUKOCYTESUR NEGATIVE 01/12/2020 1256   Sepsis Labs Invalid input(s): PROCALCITONIN,  WBC,  LACTICIDVEN Microbiology Recent Results (from the past 240 hour(s))  Urine culture     Status: Abnormal   Collection Time: 01/12/20 11:34 AM   Specimen: Urine, Random  Result Value Ref Range Status   Specimen Description URINE, RANDOM  Final   Special Requests   Final    NONE Performed at [facility], [place] [address], [place], [place] [address]    Culture >=100,000 COLONIES/mL VIRIDANS STREPTOCOCCUS (A)  Final   Report Status 01/15/2020 FINAL  Final  SARS Coronavirus 2 by RT PCR (hospital order, performed in [facility] hospital lab) Nasopharyngeal Nasopharyngeal Swab     Status: None   Collection Time: 01/12/20  9:25 PM   Specimen: Nasopharyngeal Swab  Result Value Ref Range Status   SARS Coronavirus 2 NEGATIVE NEGATIVE Final    Comment: (NOTE) SARS-CoV-2 target nucleic acids are NOT DETECTED.  The SARS-CoV-2 RNA is generally detectable in upper and lower respiratory specimens during the acute phase of infection. The lowest concentration of SARS-CoV-2 viral copies this assay can detect is 250 copies / mL. A negative result does not preclude SARS-CoV-2 infection and should not be used as the sole basis for treatment or other patient management decisions.  A negative result may occur with improper specimen collection / handling, submission of specimen other than nasopharyngeal swab, presence of viral mutation(s) within the areas targeted by this assay, and inadequate number of viral copies (<250 copies / mL). A negative result must be combined with clinical observations, patient history, and epidemiological information.  Fact Sheet for Patients:   StrictlyIdeas.no  Fact Sheet for Healthcare Providers: BankingDealers.co.za  This test is not  yet approved or  cleared by the [place] FDA and has been authorized  for detection and/or diagnosis of SARS-CoV-2 by FDA under an Emergency Use Authorization (EUA).  This EUA will remain in effect (meaning this test can be used) for the duration of the COVID-19 declaration under Section 564(b)(1) of the Act, 21 U.S.C. section 360bbb-3(b)(1), unless the authorization is terminated or revoked sooner.  Performed at [facility], [place] [address], [place], [place] [address]      Time coordinating discharge: Over 30 minutes  SIGNED:   Charlynne Cousins, MD  [facility] 01/17/2020, 5:51 PM Pager   If 7PM-7AM, please contact night-coverage [email or web address] Password [email or web address]

## 2020-01-17 NOTE — Care Management Important Message (Signed)
Important Message  Patient Details  Name: Rebecca [name] MRN: [id number] Date of Birth: [birth year]   Medicare Important Message Given:  Yes     Rebecca [name] 01/17/2020, 12:00 PM

## 2020-01-17 NOTE — Progress Notes (Signed)
Physical Therapy Treatment Patient Details Name: Rebecca [name] MRN: [id number] DOB: 10/08/1940 Today's Date: 01/17/2020    History of Present Illness Pt is an 79 y.o. female past medical history significant of for COPD on 6 L of oxygen and heart failure. She presented with complaints of comes generalized weakness, tremor, and shortness of breath.  Pt admitted with acute on chronic resp failure with hypoxia.  CT chest negative for PE and MRI head negative for acute changes.    PT Comments    Continuing work on functional mobility and activity tolerance;  Very nice improvements in gait distance and endurance;  Good self-monitor for activity tolerance as well;  Improving O2 sats with activity; session conducted on 4 L supplemental O2; O2 sats would decr as low as 85% (observed lowest); increases to 90-94% with rest      Follow Up Recommendations  SNF;Supervision/Assistance - 24 hour (Making good improvements with activity tolerance, which opens the door to consider getting home with HHPT follow up; Still, pt reports she is still worried about managing in her independent living apt; In the absence of 24 hour assist, when I'm weighing SNF versus home, I tend to favor SNF for more rehab to maximize independence and safety with mobility prior to dc home)     Equipment Recommendations  None recommended by PT    Recommendations for Other Services       Precautions / Restrictions Precautions Precautions: Fall Precaution Comments: monitor O2    Mobility  Bed Mobility                  Transfers Overall transfer level: Needs assistance Equipment used: 4-wheeled walker Transfers: Sit to/from Stand Sit to Stand: Supervision         General transfer comment: Stood from Medical illustrator and commode (with grabbar); slow rise and supervision for safety  Ambulation/Gait Ambulation/Gait assistance: Min guard (without pysical contact) Gait Distance (Feet):  (Hallway ambulation with one  rest break) Assistive device: 4-wheeled walker (supplemental O2) Gait Pattern/deviations: Step-through pattern;Decreased step length - right;Decreased step length - left Gait velocity: reduced   General Gait Details: Cues to self-monitor for activity tolerance; smoother steps   Stairs             Wheelchair Mobility    Modified Rankin (Stroke Patients Only)       Balance     Sitting balance-Leahy Scale: Good     Standing balance support: Bilateral upper extremity supported Standing balance-Leahy Scale: Poor (approaching fair) Standing balance comment: stood at sink to wash hands                            Cognition Arousal/Alertness: Awake/alert Behavior During Therapy: WFL for tasks assessed/performed Overall Cognitive Status: Within Functional Limits for tasks assessed                                        Exercises      General Comments General comments (skin integrity, edema, etc.): Improving O2 sats with activity; session conducted on 4 L supplemental O2; O2 sats would decr as low as 85% (observed lowest); increases to 90-94% with rest      Pertinent Vitals/Pain Pain Assessment: No/denies pain    Home Living  Prior Function            PT Goals (current goals can now be found in the care plan section) Acute Rehab PT Goals Patient Stated Goal: to get stornger and return to independence PT Goal Formulation: With patient Time For Goal Achievement: 01/27/20 Potential to Achieve Goals: Good Progress towards PT goals: Progressing toward goals    Frequency    Min 3X/week      PT Plan Current plan remains appropriate    Co-evaluation              AM-PAC PT "6 Clicks" Mobility   Outcome Measure  Help needed turning from your back to your side while in a flat bed without using bedrails?: None Help needed moving from lying on your back to sitting on the side of a flat bed without  using bedrails?: None Help needed moving to and from a bed to a chair (including a wheelchair)?: None Help needed standing up from a chair using your arms (e.g., wheelchair or bedside chair)?: A Little Help needed to walk in hospital room?: A Little Help needed climbing 3-5 steps with a railing? : A Lot 6 Click Score: 20    End of Session Equipment Utilized During Treatment: Oxygen Activity Tolerance: Patient limited by fatigue Patient left: in chair;with call bell/phone within reach;with family/visitor present Nurse Communication: Mobility status PT Visit Diagnosis: Unsteadiness on feet (R26.81);Muscle weakness (generalized) (M62.81)     Time: 9169-4503 (minus approx 5 min while pt on commode) PT Time Calculation (min) (ACUTE ONLY): 42 min  Charges:  $Gait Training: 8-22 mins $Therapeutic Activity: 8-22 mins                     Van Clines, PT  Acute Rehabilitation Services Pager [phone] Office [phone]    Levi Aland 01/17/2020, 12:50 PM

## 2020-01-19 DIAGNOSIS — M6281 Muscle weakness (generalized): Secondary | ICD-10-CM | POA: Diagnosis not present

## 2020-01-19 DIAGNOSIS — R2681 Unsteadiness on feet: Secondary | ICD-10-CM | POA: Diagnosis not present

## 2020-01-21 DIAGNOSIS — R2681 Unsteadiness on feet: Secondary | ICD-10-CM | POA: Diagnosis not present

## 2020-01-21 DIAGNOSIS — M6281 Muscle weakness (generalized): Secondary | ICD-10-CM | POA: Diagnosis not present

## 2020-01-24 DIAGNOSIS — R2681 Unsteadiness on feet: Secondary | ICD-10-CM | POA: Diagnosis not present

## 2020-01-24 DIAGNOSIS — M6281 Muscle weakness (generalized): Secondary | ICD-10-CM | POA: Diagnosis not present

## 2020-01-25 DIAGNOSIS — J019 Acute sinusitis, unspecified: Secondary | ICD-10-CM | POA: Diagnosis not present

## 2020-01-25 DIAGNOSIS — R2681 Unsteadiness on feet: Secondary | ICD-10-CM | POA: Diagnosis not present

## 2020-01-25 DIAGNOSIS — M6281 Muscle weakness (generalized): Secondary | ICD-10-CM | POA: Diagnosis not present

## 2020-01-25 DIAGNOSIS — J9611 Chronic respiratory failure with hypoxia: Secondary | ICD-10-CM | POA: Diagnosis not present

## 2020-01-25 DIAGNOSIS — I509 Heart failure, unspecified: Secondary | ICD-10-CM | POA: Diagnosis not present

## 2020-01-25 DIAGNOSIS — J449 Chronic obstructive pulmonary disease, unspecified: Secondary | ICD-10-CM | POA: Diagnosis not present

## 2020-01-25 DIAGNOSIS — I10 Essential (primary) hypertension: Secondary | ICD-10-CM | POA: Diagnosis not present

## 2020-01-25 DIAGNOSIS — Z09 Encounter for follow-up examination after completed treatment for conditions other than malignant neoplasm: Secondary | ICD-10-CM | POA: Diagnosis not present

## 2020-01-26 DIAGNOSIS — R2681 Unsteadiness on feet: Secondary | ICD-10-CM | POA: Diagnosis not present

## 2020-01-26 DIAGNOSIS — M6281 Muscle weakness (generalized): Secondary | ICD-10-CM | POA: Diagnosis not present

## 2020-01-27 DIAGNOSIS — J449 Chronic obstructive pulmonary disease, unspecified: Secondary | ICD-10-CM | POA: Diagnosis not present

## 2020-01-27 DIAGNOSIS — R2681 Unsteadiness on feet: Secondary | ICD-10-CM | POA: Diagnosis not present

## 2020-01-27 DIAGNOSIS — J9611 Chronic respiratory failure with hypoxia: Secondary | ICD-10-CM | POA: Diagnosis not present

## 2020-01-27 DIAGNOSIS — M6281 Muscle weakness (generalized): Secondary | ICD-10-CM | POA: Diagnosis not present

## 2020-01-27 DIAGNOSIS — J432 Centrilobular emphysema: Secondary | ICD-10-CM | POA: Diagnosis not present

## 2020-01-28 DIAGNOSIS — M6281 Muscle weakness (generalized): Secondary | ICD-10-CM | POA: Diagnosis not present

## 2020-01-28 DIAGNOSIS — R2681 Unsteadiness on feet: Secondary | ICD-10-CM | POA: Diagnosis not present

## 2020-01-31 DIAGNOSIS — R2681 Unsteadiness on feet: Secondary | ICD-10-CM | POA: Diagnosis not present

## 2020-01-31 DIAGNOSIS — M6281 Muscle weakness (generalized): Secondary | ICD-10-CM | POA: Diagnosis not present

## 2020-02-01 DIAGNOSIS — M6281 Muscle weakness (generalized): Secondary | ICD-10-CM | POA: Diagnosis not present

## 2020-02-02 DIAGNOSIS — J449 Chronic obstructive pulmonary disease, unspecified: Secondary | ICD-10-CM | POA: Diagnosis not present

## 2020-02-02 DIAGNOSIS — E1169 Type 2 diabetes mellitus with other specified complication: Secondary | ICD-10-CM | POA: Diagnosis not present

## 2020-02-02 DIAGNOSIS — I509 Heart failure, unspecified: Secondary | ICD-10-CM | POA: Diagnosis not present

## 2020-02-02 DIAGNOSIS — R2681 Unsteadiness on feet: Secondary | ICD-10-CM | POA: Diagnosis not present

## 2020-02-02 DIAGNOSIS — I10 Essential (primary) hypertension: Secondary | ICD-10-CM | POA: Diagnosis not present

## 2020-02-02 DIAGNOSIS — M6281 Muscle weakness (generalized): Secondary | ICD-10-CM | POA: Diagnosis not present

## 2020-02-03 DIAGNOSIS — R2681 Unsteadiness on feet: Secondary | ICD-10-CM | POA: Diagnosis not present

## 2020-02-03 DIAGNOSIS — M6281 Muscle weakness (generalized): Secondary | ICD-10-CM | POA: Diagnosis not present

## 2020-02-04 DIAGNOSIS — R2681 Unsteadiness on feet: Secondary | ICD-10-CM | POA: Diagnosis not present

## 2020-02-04 DIAGNOSIS — M6281 Muscle weakness (generalized): Secondary | ICD-10-CM | POA: Diagnosis not present

## 2020-02-06 DIAGNOSIS — J9611 Chronic respiratory failure with hypoxia: Secondary | ICD-10-CM | POA: Diagnosis not present

## 2020-02-06 DIAGNOSIS — J449 Chronic obstructive pulmonary disease, unspecified: Secondary | ICD-10-CM | POA: Diagnosis not present

## 2020-02-06 DIAGNOSIS — J432 Centrilobular emphysema: Secondary | ICD-10-CM | POA: Diagnosis not present

## 2020-02-07 DIAGNOSIS — R2681 Unsteadiness on feet: Secondary | ICD-10-CM | POA: Diagnosis not present

## 2020-02-07 DIAGNOSIS — M6281 Muscle weakness (generalized): Secondary | ICD-10-CM | POA: Diagnosis not present

## 2020-02-08 ENCOUNTER — Ambulatory Visit: Payer: Medicare HMO | Admitting: Adult Health

## 2020-02-08 ENCOUNTER — Encounter: Payer: Self-pay | Admitting: Adult Health

## 2020-02-08 ENCOUNTER — Other Ambulatory Visit: Payer: Self-pay

## 2020-02-08 VITALS — BP 120/74 | HR 82

## 2020-02-08 DIAGNOSIS — R0689 Other abnormalities of breathing: Secondary | ICD-10-CM

## 2020-02-08 DIAGNOSIS — J449 Chronic obstructive pulmonary disease, unspecified: Secondary | ICD-10-CM

## 2020-02-08 DIAGNOSIS — J9611 Chronic respiratory failure with hypoxia: Secondary | ICD-10-CM | POA: Diagnosis not present

## 2020-02-08 DIAGNOSIS — J432 Centrilobular emphysema: Secondary | ICD-10-CM | POA: Diagnosis not present

## 2020-02-08 DIAGNOSIS — G936 Cerebral edema: Secondary | ICD-10-CM

## 2020-02-08 MED ORDER — BUDESONIDE 0.5 MG/2ML IN SUSP: 0.5000 mg | Freq: Two times a day (BID) | RESPIRATORY_TRACT | 5 refills | Status: DC

## 2020-02-08 MED ORDER — IPRATROPIUM BROMIDE 0.02 % IN SOLN: 0.5000 mg | Freq: Three times a day (TID) | RESPIRATORY_TRACT | 5 refills | Status: DC

## 2020-02-08 NOTE — Assessment & Plan Note (Signed)
Recent COPD exacerbation with hypercarbic respiratory failure.  Patient is prone to frequent flareups.  Has a very high symptom burden and is maintained on high flow oxygen.  She has severe COPD and emphysema.  Would benefit from nocturnal noninvasive support.  We will begin nocturnal BiPAP.  Patient education given.  We will maximize her current pulmonary hygiene regimen with increasing budesonide to twice daily.  Adding ipratropium 3 times a day along with her albuterol and Brovana.  Plan  Patient Instructions  Increase Budesonide Neb Twice daily  .  Continue on Brovana Twice daily  .  Continue on Albuterol Neb Three times a day   Add Ipratropium Neb Three times a day  .  Activity as tolerated Continue on oxygen 5l/m rest and 6l/m activity . O2 sats goal >88-90%.  Begin BIPAP At bedtime  (for hypercarbia) .  Follow-up with Dr. Vassie Loll or Willem Klingensmith NP in 6-8 weeks and As needed   Please contact office for sooner follow up if symptoms do not improve or worsen or seek emergency care

## 2020-02-08 NOTE — Progress Notes (Signed)
@Patient  ID: Rebecca [name], female    DOB: 1941-03-17, 79 y.o.   MRN: [id number]  Chief Complaint  Patient presents with  . Follow-up    COPD     Referring provider: Farris Has, MD  HPI: 79 year old female former smoker followed for COPD, chronic respiratory failure on oxygen since 2010 and lung nodules Medical history significant for previous nonischemic cardiomyopathy Lives in independent living at [facility] and drives on occasion  TEST/EVENTS :  Spirometry7/2019showed severe airway obstruction with ratio 43, FEV1 of 0.57-31% and FVC of 53%.  CT angio 06/18/18 LUL nodule 71mm  CT chest on February 16, 2019 that showed a stable 1.2 cm left thyroid nodule. Severe emphysema. Irregular 6 mm left upper lobe pulmonary nodule-stable and stable right upper lobe 5 mm pulmonary nodule.  02/08/2020 Follow up : COPD , O2 RF  Patient returns for a follow-up visit.  Patient was recently hospitalized June 30 through January 17, 2020 for a COPD exacerbation and hypercarbic respiratory failure.  PCO2 was 82.Marland Kitchen  She was treated with IV antibiotics and steroids.  CT chest showed no pulmonary embolism.  She was discharged on a prednisone taper.  Patient was seen by palliative care and chose to be a DNR.  She was recommended to have physical therapy at discharge.  She was also recommended for skilled nursing care but patient declined and is doing home physical therapy in her independent living. Since discharge patient is feeling some better but continues to feel weak and has increased shortness of breath with minimal activity.  She remains on high flow oxygen at 5 L at rest and 6 L with activity.  She says she has been using mainly 5 L because she was warned not to use higher oxygen however she does drop her oxygen with activity sometimes down into the low 80s or upper 70s.  She was treated with BiPAP during the hospitalization but said it was hard for her to wear.  Patient is currently on  budesonide nebulizer daily.  Brovana nebs twice daily and albuterol nebulizer 3 times daily.  Patient says she gets winded with minimum activity and has to rest frequently.  She denies any hemoptysis chest pain or increased leg swelling.  Allergies  Allergen Reactions  . Amoxicillin Rash  . Lisinopril Rash  . Sulfacetamide Rash  . Tetracyclines & Related Rash    Immunization History  Administered Date(s) Administered  . Influenza Split 03/16/2019  . Influenza, High Dose Seasonal PF 04/14/2018, 05/01/2019  . Moderna SARS-COVID-2 Vaccination 08/16/2019  . Pneumococcal Conjugate-13 02/05/2018  . Tdap 07/27/2018  . Zoster Recombinat (Shingrix) 07/14/2018, 09/14/2018    Past Medical History:  Diagnosis Date  . Congestive heart disease (HCC)   . COPD (chronic obstructive pulmonary disease) (HCC)   . Diabetes (HCC)   . Hyperlipemia   . Osteoporosis   . Primary hypertension     Tobacco History: Social History   Tobacco Use  Smoking Status Former Smoker  . Packs/day: 1.50  . Years: 40.00  . Pack years: 60.00  . Types: Cigarettes  . Quit date: 2003  . Years since quitting: 18.5  Smokeless Tobacco Never Used   Counseling given: Not Answered   Outpatient Medications Prior to Visit  Medication Sig Dispense Refill  . acetaminophen (TYLENOL) 500 MG tablet Take 1,000 mg by mouth 2 (two) times daily as needed for mild pain or headache.    . albuterol (PROVENTIL) (2.5 MG/3ML) 0.083% nebulizer solution PLEASE SEE ATTACHED FOR DETAILED  DIRECTIONS (Patient taking differently: Take 2.5 mg by nebulization as directed. ) 270 mL 4  . albuterol (VENTOLIN HFA) 108 (90 Base) MCG/ACT inhaler Inhale 1-2 puffs into the lungs every 6 (six) hours as needed for wheezing or shortness of breath. 8 g 5  . arformoterol (BROVANA) 15 MCG/2ML NEBU Take 2 mLs (15 mcg total) by nebulization 2 (two) times daily. 120 mL 3  . Calcium Carbonate-Vitamin D (CALCIUM 600+D) 600-200 MG-UNIT TABS Take 1 tablet by  mouth daily.     . carvedilol (COREG) 25 MG tablet Take 25 mg by mouth 2 (two) times daily.     Marland Kitchen estradiol (ESTRACE) 0.1 MG/GM vaginal cream Place 1 Applicatorful vaginally every other day.     . fluticasone (FLONASE) 50 MCG/ACT nasal spray Place 2 sprays into both nostrils daily as needed for allergies or rhinitis.    . metFORMIN (GLUCOPHAGE) 500 MG tablet Take 500 mg by mouth daily with supper.     . pravastatin (PRAVACHOL) 10 MG tablet Take 10 mg by mouth daily.     . budesonide (PULMICORT) 0.5 MG/2ML nebulizer solution Take 2 mLs (0.5 mg total) by nebulization daily. 60 mL 5  . predniSONE (DELTASONE) 10 MG tablet Takes 6 tablets for 1 days, then 5 tablets for 1 days, then 4 tablets for 1 days, then 3 tablets for 1 days, then 2 tabs for 1 days, then 1 tab for 1 days, and then stop. 21 tablet 0  . predniSONE (DELTASONE) 10 MG tablet Takes 6 tablets for 1 days, then 5 tablets for 1 days, then 4 tablets for 1 days, then 3 tablets for 1 days, then 2 tabs for 1 days, then 1 tab for 1 days, and then stop. 21 tablet 0   No facility-administered medications prior to visit.     Review of Systems:   Constitutional:   No  weight loss, night sweats,  Fevers, chills,  +fatigue, or  lassitude.  HEENT:   No headaches,  Difficulty swallowing,  Tooth/dental problems, or  Sore throat,                No sneezing, itching, ear ache, nasal congestion, post nasal drip,   CV:  No chest pain,  Orthopnea, PND, swelling in lower extremities, anasarca, dizziness, palpitations, syncope.   GI  No heartburn, indigestion, abdominal pain, nausea, vomiting, diarrhea, change in bowel habits, loss of appetite, bloody stools.   Resp:    No chest wall deformity  Skin: no rash or lesions.  GU: no dysuria, change in color of urine, no urgency or frequency.  No flank pain, no hematuria   MS:  No joint pain or swelling.  No decreased range of motion.  No back pain.    Physical Exam  BP 120/74 (BP Location: Right  Arm, Cuff Size: Normal)   Pulse 82   SpO2 93%   GEN: A/Ox3; pleasant , NAD, chronically ill-appearing, on oxygen and wheelchair   HEENT:  [place]/AT,   NOSE-clear, THROAT-clear, no lesions, no postnasal drip or exudate noted.   NECK:  Supple w/ fair ROM; no JVD; normal carotid impulses w/o bruits; no thyromegaly or nodules palpated; no lymphadenopathy.    RESP decreased breath sounds in the bases  no accessory muscle use, no dullness to percussion  CARD:  RRR, no m/r/g, tr  peripheral edema, pulses intact, no cyanosis or clubbing.  GI:   Soft & nt; nml bowel sounds; no organomegaly or masses detected.   Musco: Warm bil, no  deformities or joint swelling noted.   Neuro: alert, no focal deficits noted.    Skin: Warm, no lesions or rashes    Lab Results:  BMET  BNP  Imaging: CT Head Wo Contrast  Result Date: 01/12/2020 CLINICAL DATA:  Focal neuro deficit, greater than 6 hours, stroke suspected. Additional provided: Altered mental status since yesterday. Tremors. EXAM: CT HEAD WITHOUT CONTRAST TECHNIQUE: Contiguous axial images were obtained from the base of the skull through the vertex without intravenous contrast. COMPARISON:  No pertinent prior studies available for comparison. FINDINGS: Brain: There is mild generalized parenchymal atrophy. Mild ill-defined hypoattenuation within the cerebral white matter is nonspecific, but consistent with chronic small vessel ischemic disease. There is no acute intracranial hemorrhage. No demarcated cortical infarct is identified. No extra-axial fluid collection. No evidence of intracranial mass. No midline shift. Vascular: No hyperdense vessel.  Atherosclerotic calcifications. Skull: Normal. Negative for fracture or focal lesion. Sinuses/Orbits: Visualized orbits show no acute finding. Moderate-sized left maxillary sinus mucous retention cyst. Mild ethmoid and right sphenoid sinus mucosal thickening. Right mastoid effusion. IMPRESSION: No CT evidence of  acute intracranial abnormality. Mild generalized parenchymal atrophy and chronic small vessel ischemic disease. Mild ethmoid and right sphenoid sinus mucosal thickening. Left maxillary sinus mucous retention cyst. Right mastoid effusion. Electronically Signed   By: Jackey Loge DO   On: 01/12/2020 13:59   CT ANGIO CHEST PE W OR WO CONTRAST  Result Date: 01/12/2020 CLINICAL DATA:  Altered mental status. EXAM: CT ANGIOGRAPHY CHEST WITH CONTRAST TECHNIQUE: Multidetector CT imaging of the chest was performed using the standard protocol during bolus administration of intravenous contrast. Multiplanar CT image reconstructions and MIPs were obtained to evaluate the vascular anatomy. CONTRAST:  28mL OMNIPAQUE IOHEXOL 350 MG/ML SOLN COMPARISON:  September 28, 2019. FINDINGS: Cardiovascular: Satisfactory opacification of the pulmonary arteries to the segmental level. No evidence of pulmonary embolism. Normal heart size. No pericardial effusion. Coronary artery calcifications are noted. Atherosclerosis of thoracic aorta is noted without aneurysm formation. Mediastinum/Nodes: Small sliding-type hiatal hernia is noted. No adenopathy is noted. Thyroid gland is unremarkable. Lungs/Pleura: No pneumothorax or pleural effusion is noted. 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 Mild bilateral posterior basilar subsegmental atelectasis is noted. Upper Abdomen: Bilateral nephrolithiasis is noted. Musculoskeletal: No chest wall abnormality. No acute or significant osseous findings. Review of the MIP images confirms the above findings. IMPRESSION: 1. No definite evidence of pulmonary embolus. 2. Coronary artery calcifications are noted suggesting coronary artery disease. 3. Small sliding-type hiatal hernia. 4. Bilateral nephrolithiasis. Aortic Atherosclerosis (ICD10-I70.0) and Emphysema (ICD10-J43.9). Electronically Signed   By: Lupita Raider M.D.   On: 01/12/2020 20:15   MR BRAIN WO CONTRAST  Result Date:  01/12/2020 CLINICAL DATA:  Altered mental status beginning yesterday.  Tremors. EXAM: MRI HEAD WITHOUT CONTRAST TECHNIQUE: Multiplanar, multiecho pulse sequences of the brain and surrounding structures were obtained without intravenous contrast. COMPARISON:  Head CT same day FINDINGS: Brain: Diffusion imaging does not show any acute or subacute infarction or other cause of restricted diffusion. Chronic small-vessel ischemic changes affect the pons. No focal cerebellar finding. Cerebral hemispheres show mild chronic small-vessel ischemic change of the white matter, often seen at this age. No cortical or large vessel territory infarction. No mass lesion, hemorrhage, hydrocephalus or extra-axial collection. Vascular: Major vessels at the base of the brain show flow. Skull and upper cervical spine: Negative Sinuses/Orbits: Retention cyst left maxillary sinus. No sign of sinusitis. Orbits negative. Other: Mastoid effusions, more extensive on the right than the  left. IMPRESSION: No acute intracranial finding. Age related atrophy. Mild chronic small-vessel ischemic change of the pons and cerebral hemispheric white matter, often seen at this age. Mastoid effusions right more than left. Electronically Signed   By: Paulina Fusi M.D.   On: 01/12/2020 15:59   DG Chest Portable 1 View  Result Date: 01/12/2020 CLINICAL DATA:  Shortness of breath. EXAM: PORTABLE CHEST 1 VIEW COMPARISON:  09/28/2019 FINDINGS: The heart size is within normal limits. Ectasia and atherosclerotic calcification of thoracic aorta remains stable. Pulmonary emphysema again noted. Both lungs are clear. IMPRESSION: Emphysema. No active disease. Electronically Signed   By: Danae Orleans M.D.   On: 01/12/2020 12:02      No flowsheet data found.  No results found for: NITRICOXIDE      Assessment & Plan:   COPD with chronic bronchitis and emphysema (HCC) Recent COPD exacerbation with hypercarbic respiratory failure.  Patient is prone to  frequent flareups.  Has a very high symptom burden and is maintained on high flow oxygen.  She has severe COPD and emphysema.  Would benefit from nocturnal noninvasive support.  We will begin nocturnal BiPAP.  Patient education given.  We will maximize her current pulmonary hygiene regimen with increasing budesonide to twice daily.  Adding ipratropium 3 times a day along with her albuterol and Brovana.  Plan  Patient Instructions  Increase Budesonide Neb Twice daily  .  Continue on Brovana Twice daily  .  Continue on Albuterol Neb Three times a day   Add Ipratropium Neb Three times a day  .  Activity as tolerated Continue on oxygen 5l/m rest and 6l/m activity . O2 sats goal >88-90%.  Begin BIPAP At bedtime  (for hypercarbia) .  Follow-up with Dr. Vassie Loll or Jeret Goyer NP in 6-8 weeks and As needed   Please contact office for sooner follow up if symptoms do not improve or worsen or seek emergency care       Chronic respiratory failure with hypoxia (HCC) Hypoxic and hypercarbic respiratory failure.  Patient is continue on oxygen keep O2 saturations greater than 88 to 90%. Add in nocturnal BiPAP support.  Plan  Patient Instructions  Increase Budesonide Neb Twice daily  .  Continue on Brovana Twice daily  .  Continue on Albuterol Neb Three times a day   Add Ipratropium Neb Three times a day  .  Activity as tolerated Continue on oxygen 5l/m rest and 6l/m activity . O2 sats goal >88-90%.  Begin BIPAP At bedtime  (for hypercarbia) .  Follow-up with Dr. Vassie Loll or Naia Ruff NP in 6-8 weeks and As needed   Please contact office for sooner follow up if symptoms do not improve or worsen or seek emergency care          Rubye Oaks, NP 02/08/2020

## 2020-02-08 NOTE — Patient Instructions (Addendum)
Increase Budesonide Neb Twice daily  .  Continue on Brovana Twice daily  .  Continue on Albuterol Neb Three times a day   Add Ipratropium Neb Three times a day  .  Activity as tolerated Continue on oxygen 5l/m rest and 6l/m activity . O2 sats goal >88-90%.  Begin BIPAP At bedtime  (for hypercarbia) .  Follow-up with Dr. Vassie Loll or Layli Capshaw NP in 6-8 weeks and As needed   Please contact office for sooner follow up if symptoms do not improve or worsen or seek emergency care

## 2020-02-08 NOTE — Assessment & Plan Note (Signed)
Hypoxic and hypercarbic respiratory failure.  Patient is continue on oxygen keep O2 saturations greater than 88 to 90%. Add in nocturnal BiPAP support.  Plan  Patient Instructions  Increase Budesonide Neb Twice daily  .  Continue on Brovana Twice daily  .  Continue on Albuterol Neb Three times a day   Add Ipratropium Neb Three times a day  .  Activity as tolerated Continue on oxygen 5l/m rest and 6l/m activity . O2 sats goal >88-90%.  Begin BIPAP At bedtime  (for hypercarbia) .  Follow-up with Dr. Vassie Loll or Clayden Withem NP in 6-8 weeks and As needed   Please contact office for sooner follow up if symptoms do not improve or worsen or seek emergency care

## 2020-02-09 DIAGNOSIS — R2681 Unsteadiness on feet: Secondary | ICD-10-CM | POA: Diagnosis not present

## 2020-02-09 DIAGNOSIS — M6281 Muscle weakness (generalized): Secondary | ICD-10-CM | POA: Diagnosis not present

## 2020-02-10 ENCOUNTER — Telehealth: Payer: Self-pay | Admitting: Adult Health

## 2020-02-10 NOTE — Telephone Encounter (Signed)
Please let patient know to report immediately if she gets acute sx , fever, cough/congestion, etc. She is high risk for decompensation.   Would consider getting tested in few days to make sure she is not positive.   Please contact office for sooner follow up if symptoms do not improve or worsen or seek emergency care

## 2020-02-10 NOTE — Telephone Encounter (Signed)
Tammy just FYI  pt was in 7/27-- pt 's support person who came with her in the office is positive for covid 19

## 2020-02-10 NOTE — Telephone Encounter (Signed)
Spoke with the pt and notified of recs per TP and she verbalized understanding. Will call if needed.

## 2020-02-11 DIAGNOSIS — Z20828 Contact with and (suspected) exposure to other viral communicable diseases: Secondary | ICD-10-CM | POA: Diagnosis not present

## 2020-02-11 DIAGNOSIS — Z03818 Encounter for observation for suspected exposure to other biological agents ruled out: Secondary | ICD-10-CM | POA: Diagnosis not present

## 2020-02-14 ENCOUNTER — Telehealth: Payer: Self-pay | Admitting: Adult Health

## 2020-02-14 DIAGNOSIS — J9611 Chronic respiratory failure with hypoxia: Secondary | ICD-10-CM

## 2020-02-14 DIAGNOSIS — J4489 Other specified chronic obstructive pulmonary disease: Secondary | ICD-10-CM

## 2020-02-14 DIAGNOSIS — J449 Chronic obstructive pulmonary disease, unspecified: Secondary | ICD-10-CM

## 2020-02-14 NOTE — Telephone Encounter (Signed)
Called and spoke with [employer] they state that for the BIPAP order the diagnoses HAS TO BE either COPD or Hyperventilation Syndrome.   If it is going to be COPD patient will need to have an ONO and copy of ABG of 80 that qualifies her.  If it is Hyperventilation Syndrome patient will need to have PFT and 2 ABG's one that is baseline and another one that is first thing in the morning.    Rebecca [name] please advise

## 2020-02-15 DIAGNOSIS — J9611 Chronic respiratory failure with hypoxia: Secondary | ICD-10-CM | POA: Diagnosis not present

## 2020-02-15 DIAGNOSIS — J449 Chronic obstructive pulmonary disease, unspecified: Secondary | ICD-10-CM | POA: Diagnosis not present

## 2020-02-15 DIAGNOSIS — J432 Centrilobular emphysema: Secondary | ICD-10-CM | POA: Diagnosis not present

## 2020-02-15 NOTE — Telephone Encounter (Signed)
ONO order placed. Nothing further needed.

## 2020-02-15 NOTE — Telephone Encounter (Signed)
Per [employer] guidelines says she has to have an ONO on her current oxygen to verify that her oxygen decreases below 88% .  Please let patient know this Is required.

## 2020-02-15 NOTE — Telephone Encounter (Signed)
Yes please add ONO order on 5l/m of oxygen .

## 2020-02-15 NOTE — Telephone Encounter (Signed)
Spoke with [employer] and contact person is French Ana and she can be reached at [phone].

## 2020-02-15 NOTE — Telephone Encounter (Signed)
Can we please get a number for the DME contact person as  I have not heard that the new requirements for PCO2 are now 80 to qualify .   I will not be doing a ONO on room air with a patient on 6 liters of oxygen that would be very dangerous for this patient.   Also do not understand the dx for Hyperventilation syndrome , do they mean Hypoventilation syndrome ?   Once I talk with them I can better understand what they are requiring .

## 2020-02-15 NOTE — Telephone Encounter (Signed)
Informed pt ONO would have to be preformed while pt was on O2.  Tammy please advise on how many liters of O2 you want used. Pt states currently requiring 5L cont. O2. Also no order is in for ONO would you like me to place order? Thank you

## 2020-02-16 DIAGNOSIS — M6281 Muscle weakness (generalized): Secondary | ICD-10-CM | POA: Diagnosis not present

## 2020-02-16 DIAGNOSIS — R41841 Cognitive communication deficit: Secondary | ICD-10-CM | POA: Diagnosis not present

## 2020-02-16 DIAGNOSIS — R2681 Unsteadiness on feet: Secondary | ICD-10-CM | POA: Diagnosis not present

## 2020-02-17 DIAGNOSIS — R2681 Unsteadiness on feet: Secondary | ICD-10-CM | POA: Diagnosis not present

## 2020-02-17 DIAGNOSIS — M6281 Muscle weakness (generalized): Secondary | ICD-10-CM | POA: Diagnosis not present

## 2020-02-18 DIAGNOSIS — R2681 Unsteadiness on feet: Secondary | ICD-10-CM | POA: Diagnosis not present

## 2020-02-18 DIAGNOSIS — M6281 Muscle weakness (generalized): Secondary | ICD-10-CM | POA: Diagnosis not present

## 2020-02-21 ENCOUNTER — Emergency Department (HOSPITAL_COMMUNITY): Payer: Medicare HMO

## 2020-02-21 ENCOUNTER — Emergency Department (HOSPITAL_COMMUNITY)
Admission: EM | Admit: 2020-02-21 | Discharge: 2020-02-21 | Disposition: A | Discharge status: Home / Self Care | Payer: Medicare HMO | Attending: Emergency Medicine | Admitting: Emergency Medicine

## 2020-02-21 ENCOUNTER — Encounter (HOSPITAL_COMMUNITY): Payer: Self-pay

## 2020-02-21 ENCOUNTER — Telehealth: Payer: Self-pay | Admitting: Adult Health

## 2020-02-21 ENCOUNTER — Other Ambulatory Visit: Payer: Self-pay

## 2020-02-21 DIAGNOSIS — R2681 Unsteadiness on feet: Secondary | ICD-10-CM | POA: Diagnosis not present

## 2020-02-21 DIAGNOSIS — J439 Emphysema, unspecified: Secondary | ICD-10-CM | POA: Diagnosis not present

## 2020-02-21 DIAGNOSIS — E119 Type 2 diabetes mellitus without complications: Secondary | ICD-10-CM | POA: Diagnosis not present

## 2020-02-21 DIAGNOSIS — Z87891 Personal history of nicotine dependence: Secondary | ICD-10-CM | POA: Insufficient documentation

## 2020-02-21 DIAGNOSIS — J0100 Acute maxillary sinusitis, unspecified: Secondary | ICD-10-CM | POA: Diagnosis not present

## 2020-02-21 DIAGNOSIS — J441 Chronic obstructive pulmonary disease with (acute) exacerbation: Secondary | ICD-10-CM | POA: Diagnosis not present

## 2020-02-21 DIAGNOSIS — M6281 Muscle weakness (generalized): Secondary | ICD-10-CM | POA: Diagnosis not present

## 2020-02-21 DIAGNOSIS — Z7984 Long term (current) use of oral hypoglycemic drugs: Secondary | ICD-10-CM | POA: Diagnosis not present

## 2020-02-21 DIAGNOSIS — I509 Heart failure, unspecified: Secondary | ICD-10-CM | POA: Insufficient documentation

## 2020-02-21 DIAGNOSIS — Z7951 Long term (current) use of inhaled steroids: Secondary | ICD-10-CM | POA: Diagnosis not present

## 2020-02-21 DIAGNOSIS — R0602 Shortness of breath: Secondary | ICD-10-CM | POA: Diagnosis not present

## 2020-02-21 DIAGNOSIS — R0902 Hypoxemia: Secondary | ICD-10-CM | POA: Diagnosis not present

## 2020-02-21 DIAGNOSIS — I499 Cardiac arrhythmia, unspecified: Secondary | ICD-10-CM | POA: Diagnosis not present

## 2020-02-21 LAB — COMPREHENSIVE METABOLIC PANEL
ALT: 14 U/L (ref 0–44)
AST: 15 U/L (ref 15–41)
Albumin: 3.1 g/dL — ABNORMAL LOW (ref 3.5–5.0)
Alkaline Phosphatase: 48 U/L (ref 38–126)
Anion gap: 9 (ref 5–15)
BUN: 8 mg/dL (ref 8–23)
CO2: 39 mmol/L — ABNORMAL HIGH (ref 22–32)
Calcium: 9.3 mg/dL (ref 8.9–10.3)
Chloride: 88 mmol/L — ABNORMAL LOW (ref 98–111)
Creatinine, Ser: 0.63 mg/dL (ref 0.44–1.00)
GFR calc Af Amer: >60 mL/min (ref >60)
GFR calc non Af Amer: >60 mL/min (ref >60)
Glucose, Bld: 131 mg/dL — ABNORMAL HIGH (ref 70–99)
Potassium: 4.3 mmol/L (ref 3.5–5.1)
Sodium: 136 mmol/L (ref 135–145)
Total Bilirubin: 0.6 mg/dL (ref 0.3–1.2)
Total Protein: 5.6 g/dL — ABNORMAL LOW (ref 6.5–8.1)

## 2020-02-21 LAB — CBC WITH DIFFERENTIAL/PLATELET
Abs Immature Granulocytes: 0.04 10*3/uL (ref 0.00–0.07)
Basophils Absolute: 0 10*3/uL (ref 0.0–0.1)
Basophils Relative: 0 %
Eosinophils Absolute: 0 10*3/uL (ref 0.0–0.5)
Eosinophils Relative: 0 %
HCT: 36.8 % (ref 36.0–46.0)
Hemoglobin: 11 g/dL — ABNORMAL LOW (ref 12.0–15.0)
Immature Granulocytes: 1 %
Lymphocytes Relative: 15 %
Lymphs Abs: 1.1 10*3/uL (ref 0.7–4.0)
MCH: 30.4 pg (ref 26.0–34.0)
MCHC: 29.9 g/dL — ABNORMAL LOW (ref 30.0–36.0)
MCV: 101.7 fL — ABNORMAL HIGH (ref 80.0–100.0)
Monocytes Absolute: 0.5 10*3/uL (ref 0.1–1.0)
Monocytes Relative: 7 %
Neutro Abs: 5.5 10*3/uL (ref 1.7–7.7)
Neutrophils Relative %: 77 %
Platelets: 233 10*3/uL (ref 150–400)
RBC: 3.62 MIL/uL — ABNORMAL LOW (ref 3.87–5.11)
RDW: 13.6 % (ref 11.5–15.5)
WBC: 7.1 10*3/uL (ref 4.0–10.5)
nRBC: 0 % (ref 0.0–0.2)

## 2020-02-21 LAB — BRAIN NATRIURETIC PEPTIDE: B Natriuretic Peptide: 116.4 pg/mL — ABNORMAL HIGH (ref 0.0–100.0)

## 2020-02-21 MED ORDER — DEXAMETHASONE SODIUM PHOSPHATE 10 MG/ML IJ SOLN
10.0000 mg | Freq: Once | INTRAMUSCULAR | Status: AC
  Administered 2020-02-21: 10 mg via INTRAVENOUS
  Filled 2020-02-21: qty 1

## 2020-02-21 NOTE — ED Triage Notes (Signed)
Pt BIB [facility] from ind living at [facility] w/ c/o SOB. Pt 3L [place] at baseline, found to be 70% by staff this am. Pt placed on 3L [place] upon EMS arrival, O2 to 96%. PT A&Ox4, NAD, 100% on 3L [place] att.

## 2020-02-21 NOTE — Telephone Encounter (Signed)
Spoke with the pt's rehab director at the facility that she resides  She states that pt is having increased SOB and her sats are 78-81 on 6 lpm at rest  I advised to take pt to ED for eval  She verbalized understanding  Will forward to TP to make her aware

## 2020-02-21 NOTE — ED Notes (Signed)
Patient verbalizes understanding of discharge instructions. Opportunity for questioning and answers were provided. Armband removed by staff. Patient discharged from ED.  

## 2020-02-21 NOTE — Discharge Instructions (Addendum)
As discussed, your evaluation today has been largely reassuring.  But, it is important that you monitor your condition carefully, and do not hesitate to return to the ED if you develop new, or concerning changes in your condition. ? ?Otherwise, please follow-up with your physician for appropriate ongoing care. ? ?

## 2020-02-21 NOTE — Telephone Encounter (Signed)
Patient in ER currently 

## 2020-02-21 NOTE — ED Provider Notes (Signed)
[facility] EMERGENCY DEPARTMENT Provider Note   CSN: [id number] Arrival date & time: 02/21/20  1115     History Chief Complaint  Patient presents with   Shortness of Breath    Rebecca [name] is a 79 y.o. female.  HPI    Patient presents from her assisted living facility w concern of SOB / hypoxia.  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  She went to sleep in her usual state of health, today, awoke with dyspnea.  Not checking her oxygen saturation level she was found to be hypoxic. She notes in spite of using for 5 L constantly, via nasal cannula symptoms persisted. Similarly, in spite of taking her home medications, bronchodilators, she had persistent symptoms. EMS reports that on their arrival patient was hypoxic, though she improved with 3 L via nasal cannula to normal saturation levels. She was otherwise hemodynamically unremarkable in route.   Past Medical History:  Diagnosis Date   Congestive heart disease (HCC)    COPD (chronic obstructive pulmonary disease) (HCC)    Diabetes (HCC)    Hyperlipemia    Osteoporosis    Primary hypertension     Patient Active Problem List   Diagnosis Date Noted   Chronic obstructive pulmonary disease with acute exacerbation (HCC)    Palliative care encounter    Goals of care, counseling/discussion    Acute respiratory failure with hypoxia (HCC) 01/13/2020   COPD exacerbation (HCC) 01/12/2020   Nonischemic cardiomyopathy (HCC) 07/02/2018   Lung nodule 06/18/2018   Chronic respiratory failure with hypoxia (HCC) 02/05/2018   COPD with chronic bronchitis and emphysema (HCC) 02/05/2018    History reviewed. No pertinent surgical history.   OB History   No obstetric history on file.     History reviewed. No pertinent family history.  Social History   Tobacco Use   Smoking status: Former Smoker    Packs/day: 1.50    Years: 40.00    Pack years: 60.00     Types: Cigarettes    Quit date: 2003    Years since quitting: 18.6   Smokeless tobacco: Never Used  Building services engineer Use: Never used  Substance Use Topics   Alcohol use: Yes    Alcohol/week: 1.0 standard drink    Types: 1 Glasses of wine per week   Drug use: Never    Home Medications Prior to Admission medications   Medication Sig Start Date End Date Taking? Authorizing Provider  acetaminophen (TYLENOL) 500 MG tablet Take 1,000 mg by mouth 2 (two) times daily as needed for mild pain or headache.   Yes [provider]  albuterol (PROVENTIL) (2.5 MG/3ML) 0.083% nebulizer solution PLEASE SEE ATTACHED FOR DETAILED DIRECTIONS Patient taking differently: Take 2.5 mg by nebulization as directed.  11/17/19  Yes Parrett, Tammy S, NP  albuterol (VENTOLIN HFA) 108 (90 Base) MCG/ACT inhaler Inhale 1-2 puffs into the lungs every 6 (six) hours as needed for wheezing or shortness of breath. 12/27/19  Yes Oretha Milch, MD  arformoterol (BROVANA) 15 MCG/2ML NEBU Take 2 mLs (15 mcg total) by nebulization 2 (two) times daily. 10/25/19  Yes Oretha Milch, MD  budesonide (PULMICORT) 0.5 MG/2ML nebulizer solution Take 2 mLs (0.5 mg total) by nebulization 2 (two) times daily. 02/08/20  Yes Parrett, Tammy S, NP  Calcium Carbonate-Vitamin D (CALCIUM 600+D) 600-200 MG-UNIT TABS Take 1 tablet by mouth daily.    Yes [provider]  carvedilol (COREG) 25 MG tablet Take 25 mg by mouth 2 (two) times daily.  01/21/18  Yes [provider]  estradiol (ESTRACE) 0.1 MG/GM vaginal cream Place 1 Applicatorful vaginally every other day.  09/09/18  Yes [provider]  fluticasone (FLONASE) 50 MCG/ACT nasal spray Place 2 sprays into both nostrils daily as needed for allergies or rhinitis.   Yes [provider]  furosemide (LASIX) 20 MG tablet Take 20 mg by mouth daily as needed for fluid. 02/02/20  Yes [provider]  ipratropium (ATROVENT) 0.02 % nebulizer solution Take  2.5 mLs (0.5 mg total) by nebulization 3 (three) times daily. 02/08/20  Yes Parrett, Tammy S, NP  metFORMIN (GLUCOPHAGE) 500 MG tablet Take 500 mg by mouth daily with supper.  01/21/18  Yes [provider]  Potassium Chloride ER 20 MEQ TBCR Take 1 tablet by mouth daily as needed for fluid. When you take furosemide 02/02/20  Yes [provider]  pravastatin (PRAVACHOL) 10 MG tablet Take 10 mg by mouth every other day.  07/16/18  Yes [provider]    Allergies    Amoxicillin, Lisinopril, Sulfacetamide, and Tetracyclines & related  Review of Systems   Review of Systems  Constitutional:       Per HPI, otherwise negative  HENT:       Per HPI, otherwise negative  Respiratory:       Per HPI, otherwise negative  Cardiovascular:       Per HPI, otherwise negative  Gastrointestinal: Negative for vomiting.  Endocrine:       Negative aside from HPI  Genitourinary:       Neg aside from HPI   Musculoskeletal:       Per HPI, otherwise negative  Skin: Negative.   Neurological: Negative for syncope.    Physical Exam Updated Vital Signs BP (!) 187/95    Pulse 89    Temp 98.8 F (37.1 C) (Oral)    Resp (!) 22    Ht 5\' 2"  (1.575 m)    Wt 70.3 kg    SpO2 100%    BMI 28.35 kg/m   Physical Exam Vitals and nursing note reviewed.  Constitutional:      General: She is not in acute distress.    Appearance: She is well-developed.  HENT:     Head: Normocephalic and atraumatic.  Eyes:     Conjunctiva/sclera: Conjunctivae normal.  Cardiovascular:     Rate and Rhythm: Normal rate and regular rhythm.  Pulmonary:     Effort: Pulmonary effort is normal. No respiratory distress.     Breath sounds: Normal breath sounds. No stridor.  Abdominal:     General: There is no distension.  Skin:    General: Skin is warm and dry.  Neurological:     Mental Status: She is alert and oriented to person, place, and time.     Cranial Nerves: No cranial nerve deficit.     ED Results /  Procedures / Treatments   Labs (all labs ordered are listed, but only abnormal results are displayed) Labs Reviewed  COMPREHENSIVE METABOLIC PANEL - Abnormal; Notable for the following components:      Result Value   Chloride 88 (*)    CO2 39 (*)    Glucose, Bld 131 (*)    Total Protein 5.6 (*)    Albumin 3.1 (*)    All other components within normal limits  CBC WITH DIFFERENTIAL/PLATELET - Abnormal; Notable for the following components:  RBC 3.62 (*)    Hemoglobin 11.0 (*)    MCV 101.7 (*)    MCHC 29.9 (*)    All other components within normal limits  BRAIN NATRIURETIC PEPTIDE - Abnormal; Notable for the following components:   B Natriuretic Peptide 116.4 (*)    All other components within normal limits  URINALYSIS, ROUTINE W REFLEX MICROSCOPIC      EKG EKG Interpretation  Date/Time:  Monday February 21 2020 11:37:39 EDT Ventricular Rate:  80 PR Interval:    QRS Duration: 137 QT Interval:  495 QTC Calculation: 572 R Axis:   19 Text Interpretation: Sinus rhythm Left bundle branch block No significant change since last tracing Abnormal ECG Confirmed by Gerhard Munch [phone]) on 02/21/2020 12:20:42 PM   Radiology DG Chest Port 1 View  Result Date: 02/21/2020 CLINICAL DATA:  Short of breath EXAM: PORTABLE CHEST 1 VIEW COMPARISON:  Radiograph 01/12/2020 FINDINGS: Normal cardiac silhouette. Interstitial thickening at the lung bases slightly increased from comparison exam. Emphysema the upper lobes. No focal consolidation. No pleural fluid. No acute osseous abnormality. IMPRESSION: Interstitial edema pattern at the lung bases. Emphysema in the upper lobes. Electronically Signed   By: Genevive Bi M.D.   On: 02/21/2020 12:38    Procedures Procedures (including critical care time)  Medications Ordered in ED Medications  dexamethasone (DECADRON) injection 10 mg (has no administration in time range)    ED Course  I have reviewed the triage vital signs and the nursing  notes.  Pertinent labs & imaging results that were available during my care of the patient were reviewed by me and considered in my medical decision making (see chart for details).   Elderly female with COPD home oxygen requirement presents with new dyspnea, no hypoxia. With consideration of infection versus COPD exacerbation versus atypical ACS, labs, x-ray ordered after initial evaluation.  2:31 PM Patient awake, alert.  She is now saturating 100% on 4 L via nasal cannula which is her home regimen. I have discussed her presentation with her and her daughter, reassuring labs, x-ray, no pneumonia. On discussing her home regimen, it sounds as though there may be a malfunction in the patient's oxygen concentrator, and this has since been addressed. Now, with no ongoing requirements, no evidence for pneumonia, reassuring labs as above, patient is amenable to, appropriate for discharge with outpatient follow-up. Final Clinical Impression(s) / ED Diagnoses Final diagnoses:  COPD exacerbation (HCC)     Gerhard Munch, MD 02/21/20 1434

## 2020-02-22 ENCOUNTER — Encounter: Payer: Self-pay | Admitting: Adult Health

## 2020-02-22 DIAGNOSIS — R2681 Unsteadiness on feet: Secondary | ICD-10-CM | POA: Diagnosis not present

## 2020-02-22 DIAGNOSIS — M6281 Muscle weakness (generalized): Secondary | ICD-10-CM | POA: Diagnosis not present

## 2020-02-23 DIAGNOSIS — R41841 Cognitive communication deficit: Secondary | ICD-10-CM | POA: Diagnosis not present

## 2020-02-23 DIAGNOSIS — M6281 Muscle weakness (generalized): Secondary | ICD-10-CM | POA: Diagnosis not present

## 2020-02-23 DIAGNOSIS — R2681 Unsteadiness on feet: Secondary | ICD-10-CM | POA: Diagnosis not present

## 2020-02-24 DIAGNOSIS — M6281 Muscle weakness (generalized): Secondary | ICD-10-CM | POA: Diagnosis not present

## 2020-02-24 DIAGNOSIS — J449 Chronic obstructive pulmonary disease, unspecified: Secondary | ICD-10-CM | POA: Diagnosis not present

## 2020-02-24 DIAGNOSIS — R2681 Unsteadiness on feet: Secondary | ICD-10-CM | POA: Diagnosis not present

## 2020-02-25 DIAGNOSIS — M6281 Muscle weakness (generalized): Secondary | ICD-10-CM | POA: Diagnosis not present

## 2020-02-25 DIAGNOSIS — R0902 Hypoxemia: Secondary | ICD-10-CM | POA: Diagnosis not present

## 2020-02-25 DIAGNOSIS — R2681 Unsteadiness on feet: Secondary | ICD-10-CM | POA: Diagnosis not present

## 2020-02-25 DIAGNOSIS — J449 Chronic obstructive pulmonary disease, unspecified: Secondary | ICD-10-CM | POA: Diagnosis not present

## 2020-02-25 DIAGNOSIS — J9611 Chronic respiratory failure with hypoxia: Secondary | ICD-10-CM | POA: Diagnosis not present

## 2020-02-25 DIAGNOSIS — I509 Heart failure, unspecified: Secondary | ICD-10-CM | POA: Diagnosis not present

## 2020-02-28 DIAGNOSIS — J449 Chronic obstructive pulmonary disease, unspecified: Secondary | ICD-10-CM | POA: Diagnosis not present

## 2020-02-28 DIAGNOSIS — J432 Centrilobular emphysema: Secondary | ICD-10-CM | POA: Diagnosis not present

## 2020-02-28 DIAGNOSIS — M6281 Muscle weakness (generalized): Secondary | ICD-10-CM | POA: Diagnosis not present

## 2020-02-28 DIAGNOSIS — J9611 Chronic respiratory failure with hypoxia: Secondary | ICD-10-CM | POA: Diagnosis not present

## 2020-02-28 DIAGNOSIS — R2681 Unsteadiness on feet: Secondary | ICD-10-CM | POA: Diagnosis not present

## 2020-02-29 DIAGNOSIS — R2681 Unsteadiness on feet: Secondary | ICD-10-CM | POA: Diagnosis not present

## 2020-02-29 DIAGNOSIS — M6281 Muscle weakness (generalized): Secondary | ICD-10-CM | POA: Diagnosis not present

## 2020-03-01 DIAGNOSIS — J449 Chronic obstructive pulmonary disease, unspecified: Secondary | ICD-10-CM | POA: Diagnosis not present

## 2020-03-01 DIAGNOSIS — J9611 Chronic respiratory failure with hypoxia: Secondary | ICD-10-CM | POA: Diagnosis not present

## 2020-03-01 DIAGNOSIS — J432 Centrilobular emphysema: Secondary | ICD-10-CM | POA: Diagnosis not present

## 2020-03-01 DIAGNOSIS — M6281 Muscle weakness (generalized): Secondary | ICD-10-CM | POA: Diagnosis not present

## 2020-03-01 DIAGNOSIS — R2681 Unsteadiness on feet: Secondary | ICD-10-CM | POA: Diagnosis not present

## 2020-03-02 DIAGNOSIS — R2681 Unsteadiness on feet: Secondary | ICD-10-CM | POA: Diagnosis not present

## 2020-03-02 DIAGNOSIS — M6281 Muscle weakness (generalized): Secondary | ICD-10-CM | POA: Diagnosis not present

## 2020-03-02 DIAGNOSIS — R41841 Cognitive communication deficit: Secondary | ICD-10-CM | POA: Diagnosis not present

## 2020-03-03 DIAGNOSIS — R41841 Cognitive communication deficit: Secondary | ICD-10-CM | POA: Diagnosis not present

## 2020-03-03 DIAGNOSIS — R2681 Unsteadiness on feet: Secondary | ICD-10-CM | POA: Diagnosis not present

## 2020-03-03 DIAGNOSIS — M6281 Muscle weakness (generalized): Secondary | ICD-10-CM | POA: Diagnosis not present

## 2020-03-06 ENCOUNTER — Telehealth: Payer: Self-pay | Admitting: Adult Health

## 2020-03-06 DIAGNOSIS — R2681 Unsteadiness on feet: Secondary | ICD-10-CM | POA: Diagnosis not present

## 2020-03-06 DIAGNOSIS — J449 Chronic obstructive pulmonary disease, unspecified: Secondary | ICD-10-CM | POA: Diagnosis not present

## 2020-03-06 DIAGNOSIS — R41841 Cognitive communication deficit: Secondary | ICD-10-CM | POA: Diagnosis not present

## 2020-03-06 DIAGNOSIS — M6281 Muscle weakness (generalized): Secondary | ICD-10-CM | POA: Diagnosis not present

## 2020-03-06 NOTE — Telephone Encounter (Signed)
Call [employer] 8/24 am- closed right now 5:36 PM

## 2020-03-07 DIAGNOSIS — M6281 Muscle weakness (generalized): Secondary | ICD-10-CM | POA: Diagnosis not present

## 2020-03-07 DIAGNOSIS — R2681 Unsteadiness on feet: Secondary | ICD-10-CM | POA: Diagnosis not present

## 2020-03-08 DIAGNOSIS — J449 Chronic obstructive pulmonary disease, unspecified: Secondary | ICD-10-CM | POA: Diagnosis not present

## 2020-03-08 DIAGNOSIS — M6281 Muscle weakness (generalized): Secondary | ICD-10-CM | POA: Diagnosis not present

## 2020-03-08 DIAGNOSIS — J9611 Chronic respiratory failure with hypoxia: Secondary | ICD-10-CM | POA: Diagnosis not present

## 2020-03-08 DIAGNOSIS — J432 Centrilobular emphysema: Secondary | ICD-10-CM | POA: Diagnosis not present

## 2020-03-08 DIAGNOSIS — R2681 Unsteadiness on feet: Secondary | ICD-10-CM | POA: Diagnosis not present

## 2020-03-09 DIAGNOSIS — R2681 Unsteadiness on feet: Secondary | ICD-10-CM | POA: Diagnosis not present

## 2020-03-09 DIAGNOSIS — M6281 Muscle weakness (generalized): Secondary | ICD-10-CM | POA: Diagnosis not present

## 2020-03-10 DIAGNOSIS — R41841 Cognitive communication deficit: Secondary | ICD-10-CM | POA: Diagnosis not present

## 2020-03-10 DIAGNOSIS — M6281 Muscle weakness (generalized): Secondary | ICD-10-CM | POA: Diagnosis not present

## 2020-03-10 DIAGNOSIS — R2681 Unsteadiness on feet: Secondary | ICD-10-CM | POA: Diagnosis not present

## 2020-03-13 DIAGNOSIS — R41841 Cognitive communication deficit: Secondary | ICD-10-CM | POA: Diagnosis not present

## 2020-03-13 DIAGNOSIS — M6281 Muscle weakness (generalized): Secondary | ICD-10-CM | POA: Diagnosis not present

## 2020-03-13 DIAGNOSIS — R2681 Unsteadiness on feet: Secondary | ICD-10-CM | POA: Diagnosis not present

## 2020-03-14 DIAGNOSIS — M6281 Muscle weakness (generalized): Secondary | ICD-10-CM | POA: Diagnosis not present

## 2020-03-14 DIAGNOSIS — R2681 Unsteadiness on feet: Secondary | ICD-10-CM | POA: Diagnosis not present

## 2020-03-14 NOTE — Telephone Encounter (Signed)
Called [employer] and spoke to [place]. She states the pts ONO was on 5lpm and did not desat enough to qualify her for bipap. Results were faxed to office.  Will forward to [employer] as FYI.

## 2020-03-15 NOTE — Telephone Encounter (Signed)
I am confused, I did not order the ONO for BIPAP, insurance required this in order to cover oxygen cost. She is on high oxygen demands and is frequently hypoxic on high flow oxygen so this should be sufficient but DME demanded an ONO . Please see previous notes as I stated.   I ordered BIPAP At bedtime  For Frequent COPD exacerbation and chronic hypercarbia to help decrease  hospitalizations .  Please have DME manager call me so we can get this clarified and I can see what the new rules are for coverage since they seem to change frequently.   If they are just not going to cover BIPAP or Oxygen they need to take ownership so we can let the patient know insurance and DME will not cover.   If they need an ABG in stable condition than let me know .   Jernard Reiber NP-C

## 2020-03-15 NOTE — Telephone Encounter (Addendum)
[employer] has access to epic and they pull info when they get our orders so no need to fax note.  I called [employer] & spoke to [facility] - Nature conservation officer.  She states pt has to qualify with ono and she did not.  Melissa states she went over guidelines with Tammy Parrett when order for ono was placed but she will be glad to go over these guidelines again.  She states guidelines are Medicare guidelines and all dme's have to abide by them.

## 2020-03-15 NOTE — Telephone Encounter (Signed)
Routing this to [employer] so she can see update from [place].

## 2020-03-15 NOTE — Telephone Encounter (Signed)
[facility] pool, Can you please send office note that addresses medical need for bipap?  Tammy is very specific in her response about whey the Bipap was ordered.  Thank you.

## 2020-03-16 DIAGNOSIS — R2681 Unsteadiness on feet: Secondary | ICD-10-CM | POA: Diagnosis not present

## 2020-03-16 DIAGNOSIS — M6281 Muscle weakness (generalized): Secondary | ICD-10-CM | POA: Diagnosis not present

## 2020-03-16 NOTE — Telephone Encounter (Signed)
I called [employer] and spoke to [facility] - Nature conservation officer.  She is going to fax over guidelines to me so I can provide to TP.  She states ABG is still needed pt just has to qualify on ono as well.

## 2020-03-16 NOTE — Telephone Encounter (Signed)
That is fine please have them send over the Medicare guidelines to [facility] that now requires Hypoxia as the qualifier for BIPAP for Hypercarbia and no PCO2 is now indicated.  Even though she is on 5l/m of oxygen to even keep oxygen level in the upper 80s at times.  Chronically hypoxic.

## 2020-03-16 NOTE — Telephone Encounter (Signed)
Cordelia Pen can you call and give them your information to help the process along.   Thank you

## 2020-03-17 ENCOUNTER — Other Ambulatory Visit: Payer: Self-pay | Admitting: Adult Health

## 2020-03-17 DIAGNOSIS — R2681 Unsteadiness on feet: Secondary | ICD-10-CM | POA: Diagnosis not present

## 2020-03-17 DIAGNOSIS — M6281 Muscle weakness (generalized): Secondary | ICD-10-CM | POA: Diagnosis not present

## 2020-03-17 DIAGNOSIS — R0689 Other abnormalities of breathing: Secondary | ICD-10-CM

## 2020-03-17 DIAGNOSIS — J432 Centrilobular emphysema: Secondary | ICD-10-CM | POA: Diagnosis not present

## 2020-03-17 DIAGNOSIS — J449 Chronic obstructive pulmonary disease, unspecified: Secondary | ICD-10-CM | POA: Diagnosis not present

## 2020-03-17 DIAGNOSIS — J9611 Chronic respiratory failure with hypoxia: Secondary | ICD-10-CM | POA: Diagnosis not present

## 2020-03-17 NOTE — Telephone Encounter (Signed)
Information was received from [employer] and I gave to TP to review.  She is going to change order and companies.  Nothing further is needed on this message.

## 2020-03-21 ENCOUNTER — Other Ambulatory Visit: Payer: Self-pay

## 2020-03-21 ENCOUNTER — Encounter: Payer: Self-pay | Admitting: Adult Health

## 2020-03-21 ENCOUNTER — Ambulatory Visit: Payer: Medicare HMO | Admitting: Adult Health

## 2020-03-21 DIAGNOSIS — J9611 Chronic respiratory failure with hypoxia: Secondary | ICD-10-CM

## 2020-03-21 DIAGNOSIS — J449 Chronic obstructive pulmonary disease, unspecified: Secondary | ICD-10-CM

## 2020-03-21 NOTE — Patient Instructions (Addendum)
Continue on Budesonide Neb Twice daily  .  Continue on Brovana Twice daily  .  Continue on Albuterol and Ipratropium Neb Three times a day   Activity as tolerated Continue on oxygen 5l/m rest and 6l/m activity . O2 sats goal >88-90%.  Follow-up with Dr. Vassie Loll or Nyrie Sigal NP in 2 months and As needed   Please contact office for sooner follow up if symptoms do not improve or worsen or seek emergency care

## 2020-03-21 NOTE — Assessment & Plan Note (Signed)
Hypercarbic and hypoxic respiratory failure.  For now continue on oxygen 5 L at rest and 6 L with activity.  Would benefit from nocturnal BiPAP or noninvasive support to help with her chronic hypercarbia and frequent exacerbations however insurance and DME will not approve this.  Plan  Patient Instructions  Continue on Budesonide Neb Twice daily  .  Continue on Brovana Twice daily  .  Continue on Albuterol and Ipratropium Neb Three times a day   Activity as tolerated Continue on oxygen 5l/m rest and 6l/m activity . O2 sats goal >88-90%.  Follow-up with Dr. Vassie Loll or Alessandria Henken NP in 2 months and As needed   Please contact office for sooner follow up if symptoms do not improve or worsen or seek emergency care

## 2020-03-21 NOTE — Assessment & Plan Note (Signed)
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  Patient had a recent exacerbation in June and July.  Seems to be improved from this. Patient is prone to frequent exacerbations complicated by hypoxic and hypercarbic respiratory failure would do well on nocturnal BiPAP or noninvasive support however insurance and DME declined approval for BiPAP.  Plan  Patient Instructions  Continue on Budesonide Neb Twice daily  .  Continue on Brovana Twice daily  .  Continue on Albuterol and Ipratropium Neb Three times a day   Activity as tolerated Continue on oxygen 5l/m rest and 6l/m activity . O2 sats goal >88-90%.  Follow-up with Dr. Vassie Loll or Maleko Greulich NP in 2 months and As needed   Please contact office for sooner follow up if symptoms do not improve or worsen or seek emergency care

## 2020-03-21 NOTE — Progress Notes (Signed)
@Patient  ID: , female    DOB: 1941/05/29, 79 y.o.   MRN: [id number]  Chief Complaint  Patient presents with  . Follow-up    COPD     Referring provider: [id number], MD  HPI: 79 year old female former smoker followed for COPD, chronic respiratory failure on oxygen since 2010 and lung nodules Medical history significant for previous nonischemic cardiomyopathy Lives in independent living at [facility]  TEST/EVENTS :  Spirometry7/2019showed severe airway obstruction with ratio 43, FEV1 of 0.57-31% and FVC of 53%.  CT angio 06/18/18 LUL nodule 15mm  CT chest on February 16, 2019 that showed a stable 1.2 cm left thyroid nodule. Severe emphysema. Irregular 6 mm left upper lobe pulmonary nodule-stable and stable right upper lobe 5 mm pulmonary nodule.  03/21/2020 Follow up : COPD and O2 RF  Patient returns for a 1 month follow-up.  Patient has underlying severe COPD and chronic hypoxic and hypercarbic respiratory failure on oxygen.  Patient is prone to frequent exacerbations and hospitalizations.  Was admitted in June 2021 with COPD exacerbation and hypercarbic respiratory failure.  PCO2 was 82.  CT chest showed no PE.  Patient was treated with prednisone taper.  Patient is followed by palliative care.  Patient says since last visit she is starting to eat better.  She is undergoing physical therapy and Occupational Therapy.  She feels like that she is getting much stronger.  She is walking almost 1 hour each day 5 days a week.  She says it is taken her less time to recover.  She remains on high flow oxygen 5 L at rest and 6 L with heavy activity. During last hospitalization patient was treated with BiPAP support with improvement.  Last visit patient was ordered for BiPAP.  Unfortunately insurance and home health will not cover this as patient has to have an overnight oximetry test off of oxygen to verify desaturations or desaturations on 5 L of oxygen which she does not  desaturate. I discussed this with patient.  Patient declines further evaluation and does not want to investigate noninvasive support at nighttime at this time. She remains on budesonide and Brovana twice daily.  Last visit DuoNeb was added 3 times daily. She denies any chest pain orthopnea PND .   Allergies  Allergen Reactions  . Amoxicillin Rash  . Lisinopril Rash  . Sulfacetamide Rash  . Tetracyclines & Related Rash    Immunization History  Administered Date(s) Administered  . Influenza Split 03/16/2019  . Influenza, High Dose Seasonal PF 04/14/2018, 05/01/2019  . Moderna SARS-COVID-2 Vaccination 08/16/2019, 09/13/2019  . Pneumococcal Conjugate-13 02/05/2018  . Tdap 07/27/2018  . Zoster Recombinat (Shingrix) 07/14/2018, 09/14/2018    Past Medical History:  Diagnosis Date  . Congestive heart disease (HCC)   . COPD (chronic obstructive pulmonary disease) (HCC)   . Diabetes (HCC)   . Hyperlipemia   . Osteoporosis   . Primary hypertension     Tobacco History: Social History   Tobacco Use  Smoking Status Former Smoker  . Packs/day: 1.50  . Years: 40.00  . Pack years: 60.00  . Types: Cigarettes  . Quit date: 2003  . Years since quitting: 18.6  Smokeless Tobacco Never Used   Counseling given: Not Answered   Outpatient Medications Prior to Visit  Medication Sig Dispense Refill  . acetaminophen (TYLENOL) 500 MG tablet Take 1,000 mg by mouth 2 (two) times daily as needed for mild pain or headache.    . albuterol (PROVENTIL) (2.5 MG/3ML)  0.083% nebulizer solution PLEASE SEE ATTACHED FOR DETAILED DIRECTIONS (Patient taking differently: Take 2.5 mg by nebulization as directed. ) 270 mL 4  . albuterol (VENTOLIN HFA) 108 (90 Base) MCG/ACT inhaler Inhale 1-2 puffs into the lungs every 6 (six) hours as needed for wheezing or shortness of breath. 8 g 5  . arformoterol (BROVANA) 15 MCG/2ML NEBU Take 2 mLs (15 mcg total) by nebulization 2 (two) times daily. 120 mL 3  .  budesonide (PULMICORT) 0.5 MG/2ML nebulizer solution Take 2 mLs (0.5 mg total) by nebulization 2 (two) times daily. 120 mL 5  . Calcium Carbonate-Vitamin D (CALCIUM 600+D) 600-200 MG-UNIT TABS Take 1 tablet by mouth daily.     . carvedilol (COREG) 25 MG tablet Take 25 mg by mouth 2 (two) times daily.     Marland Kitchen estradiol (ESTRACE) 0.1 MG/GM vaginal cream Place 1 Applicatorful vaginally every other day.     . fluticasone (FLONASE) 50 MCG/ACT nasal spray Place 2 sprays into both nostrils daily as needed for allergies or rhinitis.    . furosemide (LASIX) 20 MG tablet Take 20 mg by mouth daily as needed for fluid.    Marland Kitchen ipratropium (ATROVENT) 0.02 % nebulizer solution Take 2.5 mLs (0.5 mg total) by nebulization 3 (three) times daily. 225 mL 5  . metFORMIN (GLUCOPHAGE) 500 MG tablet Take 500 mg by mouth daily with supper.     . Potassium Chloride ER 20 MEQ TBCR Take 1 tablet by mouth daily as needed for fluid. When you take furosemide    . pravastatin (PRAVACHOL) 10 MG tablet Take 10 mg by mouth every other day.      No facility-administered medications prior to visit.     Review of Systems:   Constitutional:   No  weight loss, night sweats,  Fevers, chills,  +fatigue, or  lassitude.  HEENT:   No headaches,  Difficulty swallowing,  Tooth/dental problems, or  Sore throat,                No sneezing, itching, ear ache, nasal congestion, post nasal drip,   CV:  No chest pain,  Orthopnea, PND, swelling in lower extremities, anasarca, dizziness, palpitations, syncope.   GI  No heartburn, indigestion, abdominal pain, nausea, vomiting, diarrhea, change in bowel habits, loss of appetite, bloody stools.   Resp:    No chest wall deformity  Skin: no rash or lesions.  GU: no dysuria, change in color of urine, no urgency or frequency.  No flank pain, no hematuria   MS:  No joint pain or swelling.  No decreased range of motion.  No back pain.    Physical Exam  BP (!) 163/74 (BP Location: Left Arm, Cuff  Size: Normal)   Pulse 83   Temp (!) 97.4 F (36.3 C) (Temporal)   Ht 5\' 2"  (1.575 m)   Wt 157 lb (71.2 kg)   SpO2 95% Comment: 5 liters  BMI 28.72 kg/m   GEN: A/Ox3; pleasant , NAD, elderly on o2    HEENT:  [place]/AT,   NOSE-clear, THROAT-clear, no lesions, no postnasal drip or exudate noted.   NECK:  Supple w/ fair ROM; no JVD; normal carotid impulses w/o bruits; no thyromegaly or nodules palpated; no lymphadenopathy.    RESP decreased breath sounds in the bases  no accessory muscle use, no dullness to percussion  CARD:  RRR, no m/r/g, tr peripheral edema, pulses intact, no cyanosis or clubbing.  GI:   Soft & nt; nml bowel sounds; no organomegaly  or masses detected.   Musco: Warm bil, no deformities or joint swelling noted.   Neuro: alert, no focal deficits noted.    Skin: Warm, no lesions or rashes    Lab Results:  CBC  BNP  Imaging: DG Chest Port 1 View  Result Date: 02/21/2020 CLINICAL DATA:  Short of breath EXAM: PORTABLE CHEST 1 VIEW COMPARISON:  Radiograph 01/12/2020 FINDINGS: Normal cardiac silhouette. Interstitial thickening at the lung bases slightly increased from comparison exam. Emphysema the upper lobes. No focal consolidation. No pleural fluid. No acute osseous abnormality. IMPRESSION: Interstitial edema pattern at the lung bases. Emphysema in the upper lobes. Electronically Signed   By: Genevive Bi M.D.   On: 02/21/2020 12:38      No flowsheet data found.  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  Patient had a recent exacerbation in June and July.  Seems to be improved from this. Patient is prone to frequent exacerbations complicated by hypoxic and hypercarbic respiratory failure would do well on nocturnal BiPAP or noninvasive support however insurance and DME declined approval for BiPAP.  Plan  Patient Instructions  Continue on  Budesonide Neb Twice daily  .  Continue on Brovana Twice daily  .  Continue on Albuterol and Ipratropium Neb Three times a day   Activity as tolerated Continue on oxygen 5l/m rest and 6l/m activity . O2 sats goal >88-90%.  Follow-up with Dr. Vassie Loll or Hridhaan Yohn NP in 2 months and As needed   Please contact office for sooner follow up if symptoms do not improve or worsen or seek emergency care       Chronic respiratory failure with hypoxia (HCC) Hypercarbic and hypoxic respiratory failure.  For now continue on oxygen 5 L at rest and 6 L with activity.  Would benefit from nocturnal BiPAP or noninvasive support to help with her chronic hypercarbia and frequent exacerbations however insurance and DME will not approve this.  Plan  Patient Instructions  Continue on Budesonide Neb Twice daily  .  Continue on Brovana Twice daily  .  Continue on Albuterol and Ipratropium Neb Three times a day   Activity as tolerated Continue on oxygen 5l/m rest and 6l/m activity . O2 sats goal >88-90%.  Follow-up with Dr. Vassie Loll or Keone Kamer NP in 2 months and As needed   Please contact office for sooner follow up if symptoms do not improve or worsen or seek emergency care          Rubye Oaks, NP 03/21/2020

## 2020-03-22 DIAGNOSIS — M6281 Muscle weakness (generalized): Secondary | ICD-10-CM | POA: Diagnosis not present

## 2020-03-22 DIAGNOSIS — R2681 Unsteadiness on feet: Secondary | ICD-10-CM | POA: Diagnosis not present

## 2020-03-23 DIAGNOSIS — M6281 Muscle weakness (generalized): Secondary | ICD-10-CM | POA: Diagnosis not present

## 2020-03-23 DIAGNOSIS — R2681 Unsteadiness on feet: Secondary | ICD-10-CM | POA: Diagnosis not present

## 2020-03-24 DIAGNOSIS — J449 Chronic obstructive pulmonary disease, unspecified: Secondary | ICD-10-CM | POA: Diagnosis not present

## 2020-03-24 DIAGNOSIS — M6281 Muscle weakness (generalized): Secondary | ICD-10-CM | POA: Diagnosis not present

## 2020-03-24 DIAGNOSIS — R2681 Unsteadiness on feet: Secondary | ICD-10-CM | POA: Diagnosis not present

## 2020-03-27 DIAGNOSIS — R2681 Unsteadiness on feet: Secondary | ICD-10-CM | POA: Diagnosis not present

## 2020-03-27 DIAGNOSIS — M6281 Muscle weakness (generalized): Secondary | ICD-10-CM | POA: Diagnosis not present

## 2020-03-28 DIAGNOSIS — M6281 Muscle weakness (generalized): Secondary | ICD-10-CM | POA: Diagnosis not present

## 2020-03-28 DIAGNOSIS — R2681 Unsteadiness on feet: Secondary | ICD-10-CM | POA: Diagnosis not present

## 2020-03-29 DIAGNOSIS — R2681 Unsteadiness on feet: Secondary | ICD-10-CM | POA: Diagnosis not present

## 2020-03-29 DIAGNOSIS — J449 Chronic obstructive pulmonary disease, unspecified: Secondary | ICD-10-CM | POA: Diagnosis not present

## 2020-03-29 DIAGNOSIS — M6281 Muscle weakness (generalized): Secondary | ICD-10-CM | POA: Diagnosis not present

## 2020-03-29 DIAGNOSIS — J432 Centrilobular emphysema: Secondary | ICD-10-CM | POA: Diagnosis not present

## 2020-03-29 DIAGNOSIS — J9611 Chronic respiratory failure with hypoxia: Secondary | ICD-10-CM | POA: Diagnosis not present

## 2020-03-30 DIAGNOSIS — M6281 Muscle weakness (generalized): Secondary | ICD-10-CM | POA: Diagnosis not present

## 2020-03-30 DIAGNOSIS — R2681 Unsteadiness on feet: Secondary | ICD-10-CM | POA: Diagnosis not present

## 2020-03-31 DIAGNOSIS — R2681 Unsteadiness on feet: Secondary | ICD-10-CM | POA: Diagnosis not present

## 2020-03-31 DIAGNOSIS — M6281 Muscle weakness (generalized): Secondary | ICD-10-CM | POA: Diagnosis not present

## 2020-04-03 ENCOUNTER — Telehealth: Payer: Self-pay | Admitting: Adult Health

## 2020-04-03 DIAGNOSIS — M6281 Muscle weakness (generalized): Secondary | ICD-10-CM | POA: Diagnosis not present

## 2020-04-03 DIAGNOSIS — R2681 Unsteadiness on feet: Secondary | ICD-10-CM | POA: Diagnosis not present

## 2020-04-03 NOTE — Telephone Encounter (Signed)
Contacted patient with results of ONO. Study showed no desats on 5 liters oxygen. Notes reviewed from Rubye Oaks NP. Patient verbalized understanding. There are not changes to the plan of care. ONO sent to scan.

## 2020-04-04 DIAGNOSIS — R2681 Unsteadiness on feet: Secondary | ICD-10-CM | POA: Diagnosis not present

## 2020-04-04 DIAGNOSIS — M6281 Muscle weakness (generalized): Secondary | ICD-10-CM | POA: Diagnosis not present

## 2020-04-05 DIAGNOSIS — M6281 Muscle weakness (generalized): Secondary | ICD-10-CM | POA: Diagnosis not present

## 2020-04-05 DIAGNOSIS — R2681 Unsteadiness on feet: Secondary | ICD-10-CM | POA: Diagnosis not present

## 2020-04-06 ENCOUNTER — Other Ambulatory Visit: Payer: Self-pay | Admitting: Adult Health

## 2020-04-06 DIAGNOSIS — R2681 Unsteadiness on feet: Secondary | ICD-10-CM | POA: Diagnosis not present

## 2020-04-06 DIAGNOSIS — M6281 Muscle weakness (generalized): Secondary | ICD-10-CM | POA: Diagnosis not present

## 2020-04-07 DIAGNOSIS — M6281 Muscle weakness (generalized): Secondary | ICD-10-CM | POA: Diagnosis not present

## 2020-04-08 DIAGNOSIS — J449 Chronic obstructive pulmonary disease, unspecified: Secondary | ICD-10-CM | POA: Diagnosis not present

## 2020-04-08 DIAGNOSIS — J432 Centrilobular emphysema: Secondary | ICD-10-CM | POA: Diagnosis not present

## 2020-04-08 DIAGNOSIS — J9611 Chronic respiratory failure with hypoxia: Secondary | ICD-10-CM | POA: Diagnosis not present

## 2020-04-10 DIAGNOSIS — R2681 Unsteadiness on feet: Secondary | ICD-10-CM | POA: Diagnosis not present

## 2020-04-10 DIAGNOSIS — M6281 Muscle weakness (generalized): Secondary | ICD-10-CM | POA: Diagnosis not present

## 2020-04-11 DIAGNOSIS — M6281 Muscle weakness (generalized): Secondary | ICD-10-CM | POA: Diagnosis not present

## 2020-04-12 ENCOUNTER — Telehealth: Payer: Self-pay | Admitting: Adult Health

## 2020-04-12 DIAGNOSIS — M6281 Muscle weakness (generalized): Secondary | ICD-10-CM | POA: Diagnosis not present

## 2020-04-12 DIAGNOSIS — R2681 Unsteadiness on feet: Secondary | ICD-10-CM | POA: Diagnosis not present

## 2020-04-12 NOTE — Telephone Encounter (Signed)
Spoke with the pt and notified of recs per TP  She verbalized understanding  Will call for sooner appt if not improving with these recs

## 2020-04-12 NOTE — Telephone Encounter (Signed)
Pt returning a phone call. Pt can be reached at [phone].

## 2020-04-12 NOTE — Telephone Encounter (Signed)
Attempted to call pt but unable to reach. Left message for her to return call. 

## 2020-04-12 NOTE — Telephone Encounter (Signed)
Spoke with patient regarding prior message. Patient stated that patient has been having fluid retention and lasix has not been helping as much. Patient said she has noticed this since using medication's Brovana and Albuterol neb medication's . Patient would like to speak to [facility] about this .   Tammy can you please advise.  Thank you

## 2020-04-12 NOTE — Telephone Encounter (Signed)
Sorry to hear that she is retaining fluid.  It is most likely not from her nebulizers. This is not an uncommon problem to happen with severe chronic lung disease because the heart has to work harder We can adjust her Lasix however sometimes it can make her too weak if we put too high of a dose make sure she is keeping a low-salt diet keep leg elevated and taking Lasix 20 mg each day if this is not working then we can get her a sooner follow-up to evaluate and see if further testing is indicated  Please contact office for sooner follow up if symptoms do not improve or worsen or seek emergency care

## 2020-04-13 DIAGNOSIS — M6281 Muscle weakness (generalized): Secondary | ICD-10-CM | POA: Diagnosis not present

## 2020-04-13 DIAGNOSIS — R2681 Unsteadiness on feet: Secondary | ICD-10-CM | POA: Diagnosis not present

## 2020-04-14 DIAGNOSIS — M6281 Muscle weakness (generalized): Secondary | ICD-10-CM | POA: Diagnosis not present

## 2020-04-16 DIAGNOSIS — J432 Centrilobular emphysema: Secondary | ICD-10-CM | POA: Diagnosis not present

## 2020-04-16 DIAGNOSIS — J449 Chronic obstructive pulmonary disease, unspecified: Secondary | ICD-10-CM | POA: Diagnosis not present

## 2020-04-16 DIAGNOSIS — J9611 Chronic respiratory failure with hypoxia: Secondary | ICD-10-CM | POA: Diagnosis not present

## 2020-04-17 DIAGNOSIS — M6281 Muscle weakness (generalized): Secondary | ICD-10-CM | POA: Diagnosis not present

## 2020-04-17 DIAGNOSIS — R2681 Unsteadiness on feet: Secondary | ICD-10-CM | POA: Diagnosis not present

## 2020-04-18 DIAGNOSIS — M6281 Muscle weakness (generalized): Secondary | ICD-10-CM | POA: Diagnosis not present

## 2020-04-18 DIAGNOSIS — R0981 Nasal congestion: Secondary | ICD-10-CM | POA: Diagnosis not present

## 2020-04-19 DIAGNOSIS — M6281 Muscle weakness (generalized): Secondary | ICD-10-CM | POA: Diagnosis not present

## 2020-04-19 DIAGNOSIS — R2681 Unsteadiness on feet: Secondary | ICD-10-CM | POA: Diagnosis not present

## 2020-04-20 DIAGNOSIS — M6281 Muscle weakness (generalized): Secondary | ICD-10-CM | POA: Diagnosis not present

## 2020-04-21 DIAGNOSIS — M6281 Muscle weakness (generalized): Secondary | ICD-10-CM | POA: Diagnosis not present

## 2020-04-21 DIAGNOSIS — R2681 Unsteadiness on feet: Secondary | ICD-10-CM | POA: Diagnosis not present

## 2020-04-24 DIAGNOSIS — R2681 Unsteadiness on feet: Secondary | ICD-10-CM | POA: Diagnosis not present

## 2020-04-24 DIAGNOSIS — M6281 Muscle weakness (generalized): Secondary | ICD-10-CM | POA: Diagnosis not present

## 2020-04-24 DIAGNOSIS — J449 Chronic obstructive pulmonary disease, unspecified: Secondary | ICD-10-CM | POA: Diagnosis not present

## 2020-04-25 DIAGNOSIS — N819 Female genital prolapse, unspecified: Secondary | ICD-10-CM | POA: Diagnosis not present

## 2020-04-25 DIAGNOSIS — N281 Cyst of kidney, acquired: Secondary | ICD-10-CM | POA: Diagnosis not present

## 2020-04-25 DIAGNOSIS — N200 Calculus of kidney: Secondary | ICD-10-CM | POA: Diagnosis not present

## 2020-04-25 DIAGNOSIS — N952 Postmenopausal atrophic vaginitis: Secondary | ICD-10-CM | POA: Diagnosis not present

## 2020-04-26 DIAGNOSIS — J449 Chronic obstructive pulmonary disease, unspecified: Secondary | ICD-10-CM | POA: Diagnosis not present

## 2020-04-26 DIAGNOSIS — M6281 Muscle weakness (generalized): Secondary | ICD-10-CM | POA: Diagnosis not present

## 2020-04-26 DIAGNOSIS — J9611 Chronic respiratory failure with hypoxia: Secondary | ICD-10-CM | POA: Diagnosis not present

## 2020-04-26 DIAGNOSIS — J432 Centrilobular emphysema: Secondary | ICD-10-CM | POA: Diagnosis not present

## 2020-04-27 DIAGNOSIS — R2681 Unsteadiness on feet: Secondary | ICD-10-CM | POA: Diagnosis not present

## 2020-04-27 DIAGNOSIS — M6281 Muscle weakness (generalized): Secondary | ICD-10-CM | POA: Diagnosis not present

## 2020-04-28 DIAGNOSIS — M6281 Muscle weakness (generalized): Secondary | ICD-10-CM | POA: Diagnosis not present

## 2020-05-01 DIAGNOSIS — R2681 Unsteadiness on feet: Secondary | ICD-10-CM | POA: Diagnosis not present

## 2020-05-01 DIAGNOSIS — M6281 Muscle weakness (generalized): Secondary | ICD-10-CM | POA: Diagnosis not present

## 2020-05-02 DIAGNOSIS — M6281 Muscle weakness (generalized): Secondary | ICD-10-CM | POA: Diagnosis not present

## 2020-05-02 DIAGNOSIS — R2681 Unsteadiness on feet: Secondary | ICD-10-CM | POA: Diagnosis not present

## 2020-05-03 DIAGNOSIS — M6281 Muscle weakness (generalized): Secondary | ICD-10-CM | POA: Diagnosis not present

## 2020-05-04 DIAGNOSIS — R2681 Unsteadiness on feet: Secondary | ICD-10-CM | POA: Diagnosis not present

## 2020-05-04 DIAGNOSIS — M6281 Muscle weakness (generalized): Secondary | ICD-10-CM | POA: Diagnosis not present

## 2020-05-05 DIAGNOSIS — M6281 Muscle weakness (generalized): Secondary | ICD-10-CM | POA: Diagnosis not present

## 2020-05-08 DIAGNOSIS — M6281 Muscle weakness (generalized): Secondary | ICD-10-CM | POA: Diagnosis not present

## 2020-05-08 DIAGNOSIS — J432 Centrilobular emphysema: Secondary | ICD-10-CM | POA: Diagnosis not present

## 2020-05-08 DIAGNOSIS — R2681 Unsteadiness on feet: Secondary | ICD-10-CM | POA: Diagnosis not present

## 2020-05-08 DIAGNOSIS — J9611 Chronic respiratory failure with hypoxia: Secondary | ICD-10-CM | POA: Diagnosis not present

## 2020-05-08 DIAGNOSIS — J449 Chronic obstructive pulmonary disease, unspecified: Secondary | ICD-10-CM | POA: Diagnosis not present

## 2020-05-09 DIAGNOSIS — M6281 Muscle weakness (generalized): Secondary | ICD-10-CM | POA: Diagnosis not present

## 2020-05-10 DIAGNOSIS — R2681 Unsteadiness on feet: Secondary | ICD-10-CM | POA: Diagnosis not present

## 2020-05-10 DIAGNOSIS — M6281 Muscle weakness (generalized): Secondary | ICD-10-CM | POA: Diagnosis not present

## 2020-05-11 DIAGNOSIS — M6281 Muscle weakness (generalized): Secondary | ICD-10-CM | POA: Diagnosis not present

## 2020-05-11 DIAGNOSIS — R2681 Unsteadiness on feet: Secondary | ICD-10-CM | POA: Diagnosis not present

## 2020-05-12 DIAGNOSIS — M6281 Muscle weakness (generalized): Secondary | ICD-10-CM | POA: Diagnosis not present

## 2020-05-15 DIAGNOSIS — M6281 Muscle weakness (generalized): Secondary | ICD-10-CM | POA: Diagnosis not present

## 2020-05-16 DIAGNOSIS — M6281 Muscle weakness (generalized): Secondary | ICD-10-CM | POA: Diagnosis not present

## 2020-05-16 DIAGNOSIS — R2681 Unsteadiness on feet: Secondary | ICD-10-CM | POA: Diagnosis not present

## 2020-05-17 DIAGNOSIS — R2681 Unsteadiness on feet: Secondary | ICD-10-CM | POA: Diagnosis not present

## 2020-05-17 DIAGNOSIS — J432 Centrilobular emphysema: Secondary | ICD-10-CM | POA: Diagnosis not present

## 2020-05-17 DIAGNOSIS — J449 Chronic obstructive pulmonary disease, unspecified: Secondary | ICD-10-CM | POA: Diagnosis not present

## 2020-05-17 DIAGNOSIS — M6281 Muscle weakness (generalized): Secondary | ICD-10-CM | POA: Diagnosis not present

## 2020-05-17 DIAGNOSIS — J9611 Chronic respiratory failure with hypoxia: Secondary | ICD-10-CM | POA: Diagnosis not present

## 2020-05-18 DIAGNOSIS — J9611 Chronic respiratory failure with hypoxia: Secondary | ICD-10-CM | POA: Diagnosis not present

## 2020-05-18 DIAGNOSIS — J449 Chronic obstructive pulmonary disease, unspecified: Secondary | ICD-10-CM | POA: Diagnosis not present

## 2020-05-18 DIAGNOSIS — J432 Centrilobular emphysema: Secondary | ICD-10-CM | POA: Diagnosis not present

## 2020-05-18 DIAGNOSIS — M6281 Muscle weakness (generalized): Secondary | ICD-10-CM | POA: Diagnosis not present

## 2020-05-19 DIAGNOSIS — M6281 Muscle weakness (generalized): Secondary | ICD-10-CM | POA: Diagnosis not present

## 2020-05-19 DIAGNOSIS — R2681 Unsteadiness on feet: Secondary | ICD-10-CM | POA: Diagnosis not present

## 2020-05-22 DIAGNOSIS — R2681 Unsteadiness on feet: Secondary | ICD-10-CM | POA: Diagnosis not present

## 2020-05-22 DIAGNOSIS — M6281 Muscle weakness (generalized): Secondary | ICD-10-CM | POA: Diagnosis not present

## 2020-05-23 DIAGNOSIS — M6281 Muscle weakness (generalized): Secondary | ICD-10-CM | POA: Diagnosis not present

## 2020-05-24 DIAGNOSIS — M6281 Muscle weakness (generalized): Secondary | ICD-10-CM | POA: Diagnosis not present

## 2020-05-24 DIAGNOSIS — R2681 Unsteadiness on feet: Secondary | ICD-10-CM | POA: Diagnosis not present

## 2020-05-25 ENCOUNTER — Telehealth: Payer: Self-pay | Admitting: Adult Health

## 2020-05-25 ENCOUNTER — Encounter: Payer: Self-pay | Admitting: Adult Health

## 2020-05-25 ENCOUNTER — Other Ambulatory Visit: Payer: Self-pay

## 2020-05-25 ENCOUNTER — Ambulatory Visit: Payer: Medicare HMO | Admitting: Adult Health

## 2020-05-25 DIAGNOSIS — J9611 Chronic respiratory failure with hypoxia: Secondary | ICD-10-CM | POA: Diagnosis not present

## 2020-05-25 DIAGNOSIS — M6281 Muscle weakness (generalized): Secondary | ICD-10-CM | POA: Diagnosis not present

## 2020-05-25 DIAGNOSIS — J449 Chronic obstructive pulmonary disease, unspecified: Secondary | ICD-10-CM

## 2020-05-25 NOTE — Patient Instructions (Addendum)
Continue on Budesonide Neb Twice daily  .  Continue on Brovana Twice daily  .  Continue on Albuterol and Ipratropium Neb Three times a day   Activity as tolerated Continue on PT and OT .  Continue on oxygen 5l/m rest and 6l/m activity . O2 sats goal >88-90%.  Follow-up with Dr. Vassie Loll or Juandavid Dallman NP in 2-3 months and As needed   Please contact office for sooner follow up if symptoms do not improve or worsen or seek emergency care

## 2020-05-25 NOTE — Telephone Encounter (Signed)
Forms located and put in the cabinet for Tammy to fill out tomorrow.

## 2020-05-25 NOTE — Assessment & Plan Note (Signed)
Hypercarbic and hypoxic respiratory failure-patient is continue on current oxygen 5 L at rest and 6 L with activity.  She would benefit from nocturnal BiPAP or noninvasive support.  However insurance will not cover.  Plan  Patient Instructions  Continue on Budesonide Neb Twice daily  .  Continue on Brovana Twice daily  .  Continue on Albuterol and Ipratropium Neb Three times a day   Activity as tolerated Continue on PT and OT .  Continue on oxygen 5l/m rest and 6l/m activity . O2 sats goal >88-90%.  Follow-up with Dr. Vassie Loll or Jaz Laningham NP in 2-3 months and As needed   Please contact office for sooner follow up if symptoms do not improve or worsen or seek emergency care

## 2020-05-25 NOTE — Telephone Encounter (Signed)
Routing encounter to Rebecca [name] who is covering TP's box now.

## 2020-05-25 NOTE — Assessment & Plan Note (Signed)
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  Last hospitalization June 2021.  Since that time.  Patient has been slowly making progress.  She is continuing in occupational and physical therapy.  Feels that her activity tolerance has improved also her recovery time is becoming quicker.  These are all very positive things for her.  She is had no increased oxygen demands. She would prefer to be on Anoro but insurance does not cover this.  She has tried other combo LABA/LAMA inhalers with poor tolerance.  For now we will continue on her current regimen.  Plan  Patient Instructions  Continue on Budesonide Neb Twice daily  .  Continue on Brovana Twice daily  .  Continue on Albuterol and Ipratropium Neb Three times a day   Activity as tolerated Continue on PT and OT .  Continue on oxygen 5l/m rest and 6l/m activity . O2 sats goal >88-90%.  Follow-up with Dr. Vassie Loll or Yosiel Thieme NP in 2-3 months and As needed   Please contact office for sooner follow up if symptoms do not improve or worsen or seek emergency care

## 2020-05-25 NOTE — Progress Notes (Signed)
@Patient  ID: , female    DOB: [birth year], 79 y.o.   MRN: [id number]  Chief Complaint  Patient presents with  . Follow-up    COPD     Referring provider: [id number], MD  HPI: 79 yo female former smoker followed for COPD, Chronic respiratory failure on Oxygen since 2010 and Lung nodules  Medical history significant for previous nonischemic cardiomyopathy Lives in independent living at [facility]   TEST/EVENTS :  Spirometry7/2019showed severe airway obstruction with ratio 43, FEV1 of 0.57-31% and FVC of 53%.  CT angio 06/18/18 LUL nodule 65mm  CT chest on February 16, 2019 that showed a stable 1.2 cm left thyroid nodule. Severe emphysema. Irregular 6 mm left upper lobe pulmonary nodule-stable and stable right upper lobe 5 mm pulmonary nodule.  Overnight oximetry test September 2021 showed no desaturations on 5 L of oxygen.  CT chest January 12, 2020 negative for PE.  Small sliding hiatal hernia.  Emphysematous changes.  Basilar atelectasis   05/25/2020 Follow up: COPD, O2 RF  Patient presents for a 1-month follow-up.  Patient has underlying severe COPD with chronic hypoxic and hypercarbic respiratory failure on oxygen.  She is prone to frequent exacerbations and hospitalizations.  Patient had hospitalization in June of this year.  For COPD exacerbation hypercarbic respiratory failure.  She was recommended for a nocturnal BiPAP/noninvasive vent at bedtime to help with exacerbations however insurance declined coverage.  She continues with recent physical therapy and Occupational Therapy 3 days a week.  Has been improving. Feels she has a faster recovery with exercise -very pleased.  She remains on oxygen 5 L at rest and 6 L with activity.  She remains on Brovana and budesonide nebulizers twice daily.  She is on DuoNeb nebulizers 3 times daily. Previously on [facility] liked it better than the nebs (less time consuming) but Insurance will not cover. Previously  tried on Stiolto did not tolerated.   Flu shot is up to date. Covid vaccines x 3 utd .     Allergies  Allergen Reactions  . Amoxicillin Rash  . Lisinopril Rash  . Sulfacetamide Rash  . Tetracyclines & Related Rash    Immunization History  Administered Date(s) Administered  . Fluad Quad(high Dose 65+) 04/07/2020  . Influenza Split 03/16/2019  . Influenza, High Dose Seasonal PF 04/14/2018, 05/01/2019  . Moderna SARS-COVID-2 Vaccination 08/16/2019, 09/13/2019, 05/22/2020  . Pneumococcal Conjugate-13 02/05/2018  . Tdap 07/27/2018  . Zoster Recombinat (Shingrix) 07/14/2018, 09/14/2018    Past Medical History:  Diagnosis Date  . Congestive heart disease (HCC)   . COPD (chronic obstructive pulmonary disease) (HCC)   . Diabetes (HCC)   . Hyperlipemia   . Osteoporosis   . Primary hypertension     Tobacco History: Social History   Tobacco Use  Smoking Status Former Smoker  . Packs/day: 1.50  . Years: 40.00  . Pack years: 60.00  . Types: Cigarettes  . Quit date: 2003  . Years since quitting: 18.8  Smokeless Tobacco Never Used   Counseling given: Not Answered   Outpatient Medications Prior to Visit  Medication Sig Dispense Refill  . acetaminophen (TYLENOL) 500 MG tablet Take 1,000 mg by mouth 2 (two) times daily as needed for mild pain or headache.    . albuterol (PROVENTIL) (2.5 MG/3ML) 0.083% nebulizer solution Take 3 mLs (2.5 mg total) by nebulization in the morning, at noon, and at bedtime. USE AS DIRECTED 225 mL 4  . albuterol (VENTOLIN HFA) 108 (90 Base) MCG/ACT  inhaler Inhale 1-2 puffs into the lungs every 6 (six) hours as needed for wheezing or shortness of breath. 8 g 5  . arformoterol (BROVANA) 15 MCG/2ML NEBU Take 2 mLs (15 mcg total) by nebulization 2 (two) times daily. 120 mL 3  . budesonide (PULMICORT) 0.5 MG/2ML nebulizer solution Take 2 mLs (0.5 mg total) by nebulization 2 (two) times daily. 120 mL 5  . Calcium Carbonate-Vitamin D (CALCIUM 600+D) 600-200  MG-UNIT TABS Take 1 tablet by mouth daily.     . carvedilol (COREG) 25 MG tablet Take 25 mg by mouth 2 (two) times daily.     Marland Kitchen estradiol (ESTRACE) 0.1 MG/GM vaginal cream Place 1 Applicatorful vaginally every other day.     . fluticasone (FLONASE) 50 MCG/ACT nasal spray Place 2 sprays into both nostrils daily as needed for allergies or rhinitis.    . furosemide (LASIX) 20 MG tablet Take 20 mg by mouth daily as needed for fluid.    Marland Kitchen ipratropium (ATROVENT) 0.02 % nebulizer solution Take 2.5 mLs (0.5 mg total) by nebulization 3 (three) times daily. 225 mL 5  . metFORMIN (GLUCOPHAGE) 500 MG tablet Take 500 mg by mouth daily with supper.     . Potassium Chloride ER 20 MEQ TBCR Take 1 tablet by mouth daily as needed for fluid. When you take furosemide    . pravastatin (PRAVACHOL) 10 MG tablet Take 10 mg by mouth every other day.      No facility-administered medications prior to visit.     Review of Systems:   Constitutional:   No  weight loss, night sweats,  Fevers, chills,  +fatigue, or  lassitude.  HEENT:   No headaches,  Difficulty swallowing,  Tooth/dental problems, or  Sore throat,                No sneezing, itching, ear ache, nasal congestion, post nasal drip,   CV:  No chest pain,  Orthopnea, PND, swelling in lower extremities, anasarca, dizziness, palpitations, syncope.   GI  No heartburn, indigestion, abdominal pain, nausea, vomiting, diarrhea, change in bowel habits, loss of appetite, bloody stools.   Resp:    No chest wall deformity  Skin: no rash or lesions.  GU: no dysuria, change in color of urine, no urgency or frequency.  No flank pain, no hematuria   MS:  No joint pain or swelling.  No decreased range of motion.  No back pain.    Physical Exam  BP (!) 150/80 (BP Location: Left Arm, Patient Position: Sitting, Cuff Size: Normal)   Pulse 79   Temp 98.9 F (37.2 C) (Temporal)   Ht 5\' 2"  (1.575 m)   Wt 153 lb 3.2 oz (69.5 kg)   SpO2 96%   BMI 28.02 kg/m    GEN: A/Ox3; pleasant , NAD, well nourished , On O2 , rolling walker    HEENT:  [place]/AT,  EACs-clear, TMs-wnl, NOSE-clear, THROAT-clear, no lesions, no postnasal drip or exudate noted.   NECK:  Supple w/ fair ROM; no JVD; normal carotid impulses w/o bruits; no thyromegaly or nodules palpated; no lymphadenopathy.    RESP  Clear  P & A; w/o, wheezes/ rales/ or rhonchi. no accessory muscle use, no dullness to percussion  CARD:  RRR, no m/r/g, no peripheral edema, pulses intact, no cyanosis or clubbing.  GI:   Soft & nt; nml bowel sounds; no organomegaly or masses detected.   Musco: Warm bil, no deformities or joint swelling noted.   Neuro: alert, no focal  deficits noted.    Skin: Warm, no lesions or rashes    Lab Results:   Imaging: No results found.    No flowsheet data found.  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  Last hospitalization June 2021.  Since that time.  Patient has been slowly making progress.  She is continuing in occupational and physical therapy.  Feels that her activity tolerance has improved also her recovery time is becoming quicker.  These are all very positive things for her.  She is had no increased oxygen demands. She would prefer to be on Anoro but insurance does not cover this.  She has tried other combo LABA/LAMA inhalers with poor tolerance.  For now we will continue on her current regimen.  Plan  Patient Instructions  Continue on Budesonide Neb Twice daily  .  Continue on Brovana Twice daily  .  Continue on Albuterol and Ipratropium Neb Three times a day   Activity as tolerated Continue on PT and OT .  Continue on oxygen 5l/m rest and 6l/m activity . O2 sats goal >88-90%.  Follow-up with Dr. Vassie Loll or Marvin Grabill NP in 2-3 months and As needed   Please contact office for sooner follow up if symptoms do not improve or worsen or seek  emergency care       Chronic respiratory failure with hypoxia (HCC) Hypercarbic and hypoxic respiratory failure-patient is continue on current oxygen 5 L at rest and 6 L with activity.  She would benefit from nocturnal BiPAP or noninvasive support.  However insurance will not cover.  Plan  Patient Instructions  Continue on Budesonide Neb Twice daily  .  Continue on Brovana Twice daily  .  Continue on Albuterol and Ipratropium Neb Three times a day   Activity as tolerated Continue on PT and OT .  Continue on oxygen 5l/m rest and 6l/m activity . O2 sats goal >88-90%.  Follow-up with Dr. Vassie Loll or Huel Centola NP in 2-3 months and As needed   Please contact office for sooner follow up if symptoms do not improve or worsen or seek emergency care          Rubye Oaks, NP 05/25/2020

## 2020-05-26 DIAGNOSIS — M6281 Muscle weakness (generalized): Secondary | ICD-10-CM | POA: Diagnosis not present

## 2020-05-30 NOTE — Telephone Encounter (Signed)
Forms were filled and faxed on 05/29/2020.  Forms in cabinet.  Nothing further needed.

## 2020-05-30 NOTE — Telephone Encounter (Signed)
Heather, please advise if TP completed this? Thanks!

## 2020-05-31 DIAGNOSIS — R2681 Unsteadiness on feet: Secondary | ICD-10-CM | POA: Diagnosis not present

## 2020-05-31 DIAGNOSIS — M6281 Muscle weakness (generalized): Secondary | ICD-10-CM | POA: Diagnosis not present

## 2020-06-01 DIAGNOSIS — M6281 Muscle weakness (generalized): Secondary | ICD-10-CM | POA: Diagnosis not present

## 2020-06-01 DIAGNOSIS — R2681 Unsteadiness on feet: Secondary | ICD-10-CM | POA: Diagnosis not present

## 2020-06-02 DIAGNOSIS — M6281 Muscle weakness (generalized): Secondary | ICD-10-CM | POA: Diagnosis not present

## 2020-06-04 DIAGNOSIS — M6281 Muscle weakness (generalized): Secondary | ICD-10-CM | POA: Diagnosis not present

## 2020-06-05 DIAGNOSIS — M6281 Muscle weakness (generalized): Secondary | ICD-10-CM | POA: Diagnosis not present

## 2020-06-06 ENCOUNTER — Telehealth: Payer: Self-pay | Admitting: Adult Health

## 2020-06-06 MED ORDER — PREDNISONE 20 MG PO TABS: 20.0000 mg | ORAL_TABLET | Freq: Every day | ORAL | 0 refills | Status: DC

## 2020-06-06 NOTE — Telephone Encounter (Signed)
Spoke with pt. She is aware of Tammy's response. Rx has been sent in. Nothing further was needed.

## 2020-06-06 NOTE — Telephone Encounter (Signed)
Spoke with pt. States that she has been having increased shortness of breath for the past week and a half. Pt is currently on 5-6L of oxygen and she will use pulsed oxygen when going to meals in her retirement community. When using 6 pulsed today, her oxygen level fell to 69%. While speaking to the pt on the phone she was at rest and her oxygen level was at 92% on 5L continuous. Pt has been told several times that she can no longer use pulsed oxygen as it does not regulate her oxygen level the way it should anymore. She had an appointment with the TP on 05/25/20. Pt would like to know any recommendations to help her through the holiday.  TP - please advise. Thanks.

## 2020-06-06 NOTE — Telephone Encounter (Signed)
Yes please do not use pulsed oxygen . Goal is to have oxygen level >88-90%.  Mild flare - Begin Prednisone 20mg  daily for 5 days #5 .  Cont with currrent maintenance regimen .   Please contact office for sooner follow up if symptoms do not improve or worsen or seek emergency care

## 2020-06-07 DIAGNOSIS — M6281 Muscle weakness (generalized): Secondary | ICD-10-CM | POA: Diagnosis not present

## 2020-06-07 DIAGNOSIS — R2681 Unsteadiness on feet: Secondary | ICD-10-CM | POA: Diagnosis not present

## 2020-06-08 DIAGNOSIS — J449 Chronic obstructive pulmonary disease, unspecified: Secondary | ICD-10-CM | POA: Diagnosis not present

## 2020-06-08 DIAGNOSIS — J432 Centrilobular emphysema: Secondary | ICD-10-CM | POA: Diagnosis not present

## 2020-06-08 DIAGNOSIS — J9611 Chronic respiratory failure with hypoxia: Secondary | ICD-10-CM | POA: Diagnosis not present

## 2020-06-12 ENCOUNTER — Other Ambulatory Visit: Payer: Self-pay | Admitting: Pulmonary Disease

## 2020-06-12 ENCOUNTER — Other Ambulatory Visit: Payer: Self-pay | Admitting: Adult Health

## 2020-06-13 DIAGNOSIS — R2681 Unsteadiness on feet: Secondary | ICD-10-CM | POA: Diagnosis not present

## 2020-06-13 DIAGNOSIS — M6281 Muscle weakness (generalized): Secondary | ICD-10-CM | POA: Diagnosis not present

## 2020-06-14 DIAGNOSIS — M6281 Muscle weakness (generalized): Secondary | ICD-10-CM | POA: Diagnosis not present

## 2020-06-14 DIAGNOSIS — R2681 Unsteadiness on feet: Secondary | ICD-10-CM | POA: Diagnosis not present

## 2020-06-15 DIAGNOSIS — I10 Essential (primary) hypertension: Secondary | ICD-10-CM | POA: Diagnosis not present

## 2020-06-15 DIAGNOSIS — Z7984 Long term (current) use of oral hypoglycemic drugs: Secondary | ICD-10-CM | POA: Diagnosis not present

## 2020-06-15 DIAGNOSIS — E1169 Type 2 diabetes mellitus with other specified complication: Secondary | ICD-10-CM | POA: Diagnosis not present

## 2020-06-15 DIAGNOSIS — G373 Acute transverse myelitis in demyelinating disease of central nervous system: Secondary | ICD-10-CM | POA: Diagnosis not present

## 2020-06-15 DIAGNOSIS — J449 Chronic obstructive pulmonary disease, unspecified: Secondary | ICD-10-CM | POA: Diagnosis not present

## 2020-06-15 DIAGNOSIS — R0602 Shortness of breath: Secondary | ICD-10-CM | POA: Diagnosis not present

## 2020-06-15 DIAGNOSIS — G9589 Other specified diseases of spinal cord: Secondary | ICD-10-CM | POA: Diagnosis not present

## 2020-06-15 DIAGNOSIS — E222 Syndrome of inappropriate secretion of antidiuretic hormone: Secondary | ICD-10-CM | POA: Diagnosis not present

## 2020-06-15 DIAGNOSIS — I509 Heart failure, unspecified: Secondary | ICD-10-CM | POA: Diagnosis not present

## 2020-06-16 DIAGNOSIS — J9611 Chronic respiratory failure with hypoxia: Secondary | ICD-10-CM | POA: Diagnosis not present

## 2020-06-16 DIAGNOSIS — M6281 Muscle weakness (generalized): Secondary | ICD-10-CM | POA: Diagnosis not present

## 2020-06-16 DIAGNOSIS — J432 Centrilobular emphysema: Secondary | ICD-10-CM | POA: Diagnosis not present

## 2020-06-16 DIAGNOSIS — R2681 Unsteadiness on feet: Secondary | ICD-10-CM | POA: Diagnosis not present

## 2020-06-16 DIAGNOSIS — J449 Chronic obstructive pulmonary disease, unspecified: Secondary | ICD-10-CM | POA: Diagnosis not present

## 2020-06-20 DIAGNOSIS — J449 Chronic obstructive pulmonary disease, unspecified: Secondary | ICD-10-CM | POA: Diagnosis not present

## 2020-07-06 DIAGNOSIS — J988 Other specified respiratory disorders: Secondary | ICD-10-CM | POA: Diagnosis not present

## 2020-07-06 DIAGNOSIS — J329 Chronic sinusitis, unspecified: Secondary | ICD-10-CM | POA: Diagnosis not present

## 2020-07-08 DIAGNOSIS — J9611 Chronic respiratory failure with hypoxia: Secondary | ICD-10-CM | POA: Diagnosis not present

## 2020-07-08 DIAGNOSIS — J449 Chronic obstructive pulmonary disease, unspecified: Secondary | ICD-10-CM | POA: Diagnosis not present

## 2020-07-08 DIAGNOSIS — J432 Centrilobular emphysema: Secondary | ICD-10-CM | POA: Diagnosis not present

## 2020-07-13 DIAGNOSIS — J449 Chronic obstructive pulmonary disease, unspecified: Secondary | ICD-10-CM | POA: Diagnosis not present

## 2020-07-17 DIAGNOSIS — J9611 Chronic respiratory failure with hypoxia: Secondary | ICD-10-CM | POA: Diagnosis not present

## 2020-07-17 DIAGNOSIS — J432 Centrilobular emphysema: Secondary | ICD-10-CM | POA: Diagnosis not present

## 2020-07-17 DIAGNOSIS — J449 Chronic obstructive pulmonary disease, unspecified: Secondary | ICD-10-CM | POA: Diagnosis not present

## 2020-07-17 NOTE — Progress Notes (Signed)
Cardiology Office Note:    Date:  07/27/2020   ID:  Rebecca [name], DOB [birth year], MRN [id number]  PCP:  Farris Has, MD  [facility] Cardiologist:  No primary care provider on file.  [facility] Electrophysiologist:  None   Referring MD: Farris Has, MD    History of Present Illness:    Rebecca [name] is a 80 y.o. 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 Kateri Plummer for further evaluation of her shortness of breath and history of HF.  Patient has chronic shortness of breath in the setting of COPD with hypoxia and hypercapnea. She is on home O2 continuously. She is also recommended for daily lasix, which she takes intermittently.  Today, the patient states that she feels like she has LE swelling despite taking lasix regularly (usually takes 3-4x/wk). She also states that she gets short of breath with minimal exertion despite having normal O2 sats. She has been told that she has congestive heart failure in the past. She saw a cardiologist in the past who told her she "had a virus that affected her heart." Work-up included cath in 2010 which "had no blockages." She was monitored with serial TTEs and was told by her Cardiologist that her function recovered and she did not need a heart doctor. This was managed in [place] by Dr. Danella Maiers. Last TTE was 3 years ago. Has not followed with Cardiology since being in [place].  Currently, the patient states that her shortness of breath is getting progressively worse. She is unable to clean, cook, make the bed due to symptoms. She lives in assisted living. She feels overall debilitated and "just wants answers."  TC 207, HDL 64, LDL 124, A1C 6.3  Past Medical History:  Diagnosis Date  . Congestive heart disease (HCC)   . COPD (chronic obstructive pulmonary disease) (HCC)   . Diabetes (HCC)   . Hyperlipemia   .  Osteoporosis   . Primary hypertension     No past surgical history on file.  Current Medications: Current Meds  Medication Sig  . acetaminophen (TYLENOL) 500 MG tablet Take 1,000 mg by mouth 2 (two) times daily as needed for mild pain or headache.  . albuterol (PROVENTIL) (2.5 MG/3ML) 0.083% nebulizer solution Take 3 mLs (2.5 mg total) by nebulization in the morning, at noon, and at bedtime. USE AS DIRECTED  . albuterol (VENTOLIN HFA) 108 (90 Base) MCG/ACT inhaler Inhale 1-2 puffs into the lungs every 6 (six) hours as needed for wheezing or shortness of breath.  . BROVANA 15 MCG/2ML NEBU USE 1 VIAL  IN  NEBULIZER TWICE  DAILY - morning and evening  . budesonide (PULMICORT) 0.5 MG/2ML nebulizer solution USE 1 VIAL  IN  NEBULIZER TWICE  DAILY - Rinse mouth after treatment  . Calcium Carbonate-Vitamin D 600-200 MG-UNIT TABS Take 1 tablet by mouth daily.   . carvedilol (COREG) 25 MG tablet Take 25 mg by mouth 2 (two) times daily.   Marland Kitchen estradiol (ESTRACE) 0.1 MG/GM vaginal cream Place 1 Applicatorful vaginally every other day.   . fluticasone (FLONASE) 50 MCG/ACT nasal spray Place 2 sprays into both nostrils daily as needed for allergies or rhinitis.  . furosemide (LASIX) 20 MG tablet Take 20 mg by mouth daily as needed for fluid.  Marland Kitchen ipratropium (ATROVENT) 0.02 % nebulizer solution USE 1 VIAL IN NEBULIZER 3 TIMES DAILY  . losartan (COZAAR) 50 MG  tablet Take 1 tablet (50 mg total) by mouth daily.  . metFORMIN (GLUCOPHAGE) 500 MG tablet Take 500 mg by mouth daily with supper.   . Potassium Chloride ER 20 MEQ TBCR Take 1 tablet by mouth daily as needed for fluid. When you take furosemide  . pravastatin (PRAVACHOL) 10 MG tablet Take 10 mg by mouth every other day.      Allergies:   Rosuvastatin calcium, Simvastatin, Amoxicillin, Lisinopril, Sulfacetamide, and Tetracyclines & related   Social History   Socioeconomic History  . Marital status: Widowed    Spouse name: Not on file  . Number of  children: Not on file  . Years of education: Not on file  . Highest education level: Not on file  Occupational History  . Not on file  Tobacco Use  . Smoking status: Former Smoker    Packs/day: 1.50    Years: 40.00    Pack years: 60.00    Types: Cigarettes    Quit date: 2003    Years since quitting: 19.0  . Smokeless tobacco: Never Used  Vaping Use  . Vaping Use: Never used  Substance and Sexual Activity  . Alcohol use: Yes    Alcohol/week: 1.0 standard drink    Types: 1 Glasses of wine per week  . Drug use: Never  . Sexual activity: Not on file  Other Topics Concern  . Not on file  Social History Narrative  . Not on file   Social Determinants of Health   Financial Resource Strain: Not on file  Food Insecurity: Not on file  Transportation Needs: Not on file  Physical Activity: Not on file  Stress: Not on file  Social Connections: Not on file     Family History: The patient's family history is not on file.  ROS:   Please see the history of present illness.    Review of Systems  Constitutional: Positive for malaise/fatigue. Negative for chills and fever.  HENT: Negative for nosebleeds.   Eyes: Negative for double vision.  Respiratory: Positive for shortness of breath and wheezing.   Cardiovascular: Positive for leg swelling. Negative for chest pain, palpitations, orthopnea, claudication and PND.  Gastrointestinal: Negative for nausea and vomiting.  Genitourinary: Negative for hematuria.  Musculoskeletal: Positive for falls and myalgias.  Neurological: Negative for dizziness and loss of consciousness.  Psychiatric/Behavioral: Negative for substance abuse.    EKGs/Labs/Other Studies Reviewed:    The following studies were reviewed today: Coronary CTA 01/12/20: FINDINGS: Cardiovascular: Satisfactory opacification of the pulmonary arteries to the segmental level. No evidence of pulmonary embolism. Normal heart size. No pericardial effusion. Coronary artery  calcifications are noted. Atherosclerosis of thoracic aorta is noted without aneurysm formation.  Mediastinum/Nodes: Small sliding-type hiatal hernia is noted. No adenopathy is noted. Thyroid gland is unremarkable.  Lungs/Pleura: No pneumothorax or pleural effusion is noted. 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 Mild bilateral posterior basilar subsegmental atelectasis is noted.  Upper Abdomen: Bilateral nephrolithiasis is noted.  Musculoskeletal: No chest wall abnormality. No acute or significant osseous findings.  Review of the MIP images confirms the above findings.  IMPRESSION: 1. No definite evidence of pulmonary embolus. 2. Coronary artery calcifications are noted suggesting coronary artery disease. 3. Small sliding-type hiatal hernia. 4. Bilateral nephrolithiasis.  EKG:  EKG is  ordered today.  The ekg ordered today demonstrates NSR with LBBB (unchanged from prior in 02/21/2020)  Recent Labs: 01/13/2020: Magnesium 1.7; TSH 0.661 02/21/2020: ALT 14; B Natriuretic Peptide 116.4; BUN 8; Creatinine, Ser 0.63; Hemoglobin  11.0; Platelets 233; Potassium 4.3; Sodium 136  Recent Lipid Panel No results found for: CHOL, TRIG, HDL, CHOLHDL, VLDL, LDLCALC, LDLDIRECT    Physical Exam:    VS:  BP (!) 154/82   Pulse 82   Ht 5\' 2"  (1.575 m)   Wt 152 lb 9.6 oz (69.2 kg)   SpO2 92%   BMI 27.91 kg/m     Wt Readings from Last 3 Encounters:  07/27/20 152 lb 9.6 oz (69.2 kg)  05/25/20 153 lb 3.2 oz (69.5 kg)  03/21/20 157 lb (71.2 kg)     GEN: Comfortable, on home O2 HEENT: Normal NECK: No JVD; No carotid bruits CARDIAC: RRR, no murmurs, rubs, gallops RESPIRATORY:  Diminished throughout, no wheezes ABDOMEN: Soft, non-tender, non-distended MUSCULOSKELETAL:  Trace edema SKIN: Warm and dry NEUROLOGIC:  Alert and oriented x 3 PSYCHIATRIC:  Normal affect   ASSESSMENT:    1. 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 Cath in 2010 performed that was reportedly normal. Was told that her pumping function recovered and she did not need to follow with Cardiology in [place]. No records brought today. She now has worsening SOB, DOE, and LE edema prompting her to come in for evaluation. Denies any chest pain, nausea, lightheadeness or syncope. On exam, only trace edema. On chronic O2 for COPD. -Check TTE to assess LVEF -Check myoview to assess for ischemia (has coronary calcification on CT chest) -Obtain records from previous Cardiologist -Start losartan 50mg  daily -Continue coreg 25mg  BID -Continue lasix 20mg  daily -Repeat BMET in 1 week  #Chronic hypoxic respiratory failure #COPD: Follows closely with pulm -Follow-up with pulm as scheduled -Continue supplemental O2  #HTN Poorly controlled at home running 150-180s -Continue coreg 25mg  BID -Start losartan 50mg  daily as above -Monitor blood pressures with goal <120s/80s  #LBBB: Appears chronic however no records from prior Cardiologist. -Check TTE and myoview as above -Obtain records as above  #HLD Managed by PCP. -Continue pravstatin 10mg ; will likely need to increase pending above work-up  #DMII -Continue metformin  Shared Decision Making/Informed Consent The risks [chest pain, shortness of breath, cardiac arrhythmias, dizziness, blood pressure fluctuations, myocardial infarction, stroke/transient ischemic attack, nausea, vomiting, allergic reaction, radiation exposure, metallic taste sensation and life-threatening complications (estimated to be 1 in 10,000)], benefits (risk stratification, diagnosing coronary artery disease, treatment guidance) and alternatives of a nuclear stress test were discussed in detail with Ms. Wehner and she agrees to proceed.    Medication Adjustments/Labs and Tests  Ordered: Current medicines are reviewed at length with the patient today.  Concerns regarding medicines are outlined above.  Orders Placed This Encounter  Procedures  . Basic metabolic panel  . Cardiac Stress Test: Informed Consent Details: Physician/Practitioner Attestation; Transcribe to consent form and obtain patient signature  . MYOCARDIAL PERFUSION IMAGING  . EKG 12-Lead  . ECHOCARDIOGRAM COMPLETE   Meds ordered this encounter  Medications  . losartan (COZAAR) 50 MG tablet    Sig: Take 1 tablet (50 mg total) by mouth daily.    Dispense:  90 tablet    Refill:  3    Patient Instructions  Medication Instructions:  1) START Losartan 50mg  once daily  *If you need a refill on your cardiac medications before your next appointment, please call your pharmacy*   Lab Work: BMET in 1 week  If you have  labs (blood work) drawn today and your tests are completely normal, you will receive your results only by: Marland Kitchen MyChart Message (if you have MyChart) OR . A paper copy in the mail If you have any lab test that is abnormal or we need to change your treatment, we will call you to review the results.   Testing/Procedures: Your physician has requested that you have an echocardiogram. Echocardiography is a painless test that uses sound waves to create images of your heart. It provides your doctor with information about the size and shape of your heart and how well your heart's chambers and valves are working. This procedure takes approximately one hour. There are no restrictions for this procedure.  Your physician has requested that you have a lexiscan myoview. For further information please visit [email or web address] Please follow instruction sheet, as given.    Follow-Up: At [facility], you and your health needs are our priority.  As part of our continuing mission to provide you with exceptional heart care, we have created designated Provider Care Teams.  These Care Teams include your  primary Cardiologist (physician) and Advanced Practice Providers (APPs -  Physician Assistants and Nurse Practitioners) who all work together to provide you with the care you need, when you need it.  We recommend signing up for the patient portal called "MyChart".  Sign up information is provided on this After Visit Summary.  MyChart is used to connect with patients for Virtual Visits (Telemedicine).  Patients are able to view lab/test results, encounter notes, upcoming appointments, etc.  Non-urgent messages can be sent to your provider as well.   To learn more about what you can do with MyChart, go to [email or web address].    Your next appointment:   3 month(s)  The format for your next appointment:   In Person  Provider:   You may see Laurance Flatten, MD or one of the following Advanced Practice Providers on your designated Care Team:    Tereso Newcomer, PA-C  Chelsea Aus, [place]    Other Instructions       Signed, Meriam Sprague, MD  07/27/2020 1:03 PM    [facility]

## 2020-07-21 ENCOUNTER — Telehealth: Payer: Self-pay | Admitting: Pulmonary Disease

## 2020-07-21 MED ORDER — IPRATROPIUM BROMIDE 0.02 % IN SOLN: RESPIRATORY_TRACT | 0 refills | Status: DC

## 2020-07-21 MED ORDER — IPRATROPIUM BROMIDE 0.02 % IN SOLN: RESPIRATORY_TRACT | 5 refills | Status: DC

## 2020-07-21 NOTE — Telephone Encounter (Signed)
Called and spoke with Patient. Patient stated she was almost completley out of Ipratropium for her neb machine.  Patient stated [facility] had faxed over several request that were never signed and faxed back for neb refills. Patient requested a small prescription be faxed to local [facility] for her pick up this weekend and a larger prescription be sent to [facility]. Ipratropium prescriptions sent to local and [employer].  Nothing further at this time.

## 2020-07-24 ENCOUNTER — Telehealth: Payer: Self-pay | Admitting: Adult Health

## 2020-07-24 DIAGNOSIS — J449 Chronic obstructive pulmonary disease, unspecified: Secondary | ICD-10-CM | POA: Diagnosis not present

## 2020-07-24 NOTE — Telephone Encounter (Signed)
Called spoke with patient.  Let her know the RX was sent 07/21/20 with 5 refills for the atrovent and 06/12/20 with 11 refills for the pulmicort.  Patient also wanted to let us know [facility] [address] [facility] not [facility] albuterol for your nebulizer it has been on back order for 2 weeks. But her daughter found it at another pharmacy.   Patient said if [facility] has any further questions she will have them call or fax over the request.   Nothing further needed at this time.

## 2020-07-26 ENCOUNTER — Telehealth: Payer: Self-pay | Admitting: Cardiology

## 2020-07-26 NOTE — Telephone Encounter (Signed)
Informed the patient that our office does have an oxygen tank that she can use while in office for her appointment if needed. She thanked me for the call.

## 2020-07-26 NOTE — Telephone Encounter (Signed)
Rebecca [name] is calling stating she is on oxygen. She is wanting to know if our office has oxygen tanks available incase hers runs out during the appointment and she needs to be hooked up to one from our office while there. Please advise.

## 2020-07-27 ENCOUNTER — Encounter: Payer: Self-pay | Admitting: Cardiology

## 2020-07-27 ENCOUNTER — Ambulatory Visit: Payer: Medicare HMO | Admitting: Cardiology

## 2020-07-27 ENCOUNTER — Encounter: Payer: Self-pay | Admitting: *Deleted

## 2020-07-27 ENCOUNTER — Other Ambulatory Visit: Payer: Self-pay

## 2020-07-27 VITALS — BP 154/82 | HR 82 | Ht 62.0 in | Wt 152.6 lb

## 2020-07-27 DIAGNOSIS — R0602 Shortness of breath: Secondary | ICD-10-CM

## 2020-07-27 MED ORDER — LOSARTAN POTASSIUM 50 MG PO TABS
50.0000 mg | ORAL_TABLET | Freq: Every day | ORAL | 3 refills | Status: DC
Start: 2020-07-27 — End: 2020-10-31

## 2020-07-27 NOTE — Patient Instructions (Signed)
Medication Instructions:  1) START Losartan 50mg  once daily  *If you need a refill on your cardiac medications before your next appointment, please call your pharmacy*   Lab Work: BMET in 1 week  If you have labs (blood work) drawn today and your tests are completely normal, you will receive your results only by: MyChart Message (if you have MyChart) OR . A paper copy in the mail If you have any lab test that is abnormal or we need to change your treatment, we will call you to review the results.   Testing/Procedures: Your physician has requested that you have an echocardiogram. Echocardiography is a painless test that uses sound waves to create images of your heart. It provides your doctor with information about the size and shape of your heart and how well your heart's chambers and valves are working. This procedure takes approximately one hour. There are no restrictions for this procedure.  Your physician has requested that you have a lexiscan myoview. For further information please visit Marland Kitchen. Please follow instruction sheet, as given.    Follow-Up: At [facility], you and your health needs are our priority.  As part of our continuing mission to provide you with exceptional heart care, we have created designated Provider Care Teams.  These Care Teams include your primary Cardiologist (physician) and Advanced Practice Providers (APPs -  Physician Assistants and Nurse Practitioners) who all work together to provide you with the care you need, when you need it.  We recommend signing up for the patient portal called "MyChart".  Sign up information is provided on this After Visit Summary.  MyChart is used to connect with patients for Virtual Visits (Telemedicine).  Patients are able to view lab/test results, encounter notes, upcoming appointments, etc.  Non-urgent messages can be sent to your provider as well.   To learn more about what you can do with MyChart, go to  [facility].    Your next appointment:   3 month(s)  The format for your next appointment:   In Person  Provider:   You may see [email or web address], MD or one of the following Advanced Practice Providers on your designated Care Team:    Laurance Flatten, PA-C  Tereso Newcomer, Chelsea Aus    Other Instructions

## 2020-08-02 ENCOUNTER — Telehealth (HOSPITAL_COMMUNITY): Payer: Self-pay | Admitting: *Deleted

## 2020-08-02 NOTE — Telephone Encounter (Signed)
Patient given detailed instructions per Myocardial Perfusion Study Information Sheet for the test on 08/08/19. Patient notified to arrive 15 minutes early and that it is imperative to arrive on time for appointment to keep from having the test rescheduled.  If you need to cancel or reschedule your appointment, please call the office within 24 hours of your appointment. . Patient verbalized understanding. Tanji Storrs Jacqueline\    

## 2020-08-03 ENCOUNTER — Ambulatory Visit: Payer: Medicare HMO | Admitting: Adult Health

## 2020-08-03 ENCOUNTER — Other Ambulatory Visit: Payer: Self-pay

## 2020-08-03 ENCOUNTER — Encounter: Payer: Self-pay | Admitting: Adult Health

## 2020-08-03 ENCOUNTER — Other Ambulatory Visit: Payer: Medicare HMO | Admitting: *Deleted

## 2020-08-03 DIAGNOSIS — I428 Other cardiomyopathies: Secondary | ICD-10-CM | POA: Diagnosis not present

## 2020-08-03 DIAGNOSIS — R0602 Shortness of breath: Secondary | ICD-10-CM | POA: Diagnosis not present

## 2020-08-03 DIAGNOSIS — J9611 Chronic respiratory failure with hypoxia: Secondary | ICD-10-CM | POA: Diagnosis not present

## 2020-08-03 DIAGNOSIS — J449 Chronic obstructive pulmonary disease, unspecified: Secondary | ICD-10-CM

## 2020-08-03 DIAGNOSIS — R5381 Other malaise: Secondary | ICD-10-CM | POA: Insufficient documentation

## 2020-08-03 LAB — BASIC METABOLIC PANEL
BUN/Creatinine Ratio: 25 (ref 12–28)
BUN: 14 mg/dL (ref 8–27)
CO2: 36 mmol/L — ABNORMAL HIGH (ref 20–29)
Calcium: 9.8 mg/dL (ref 8.7–10.3)
Chloride: 92 mmol/L — ABNORMAL LOW (ref 96–106)
Creatinine, Ser: 0.57 mg/dL (ref 0.57–1.00)
GFR calc Af Amer: 102 mL/min/{1.73_m2} (ref >59)
GFR calc non Af Amer: 88 mL/min/{1.73_m2} (ref >59)
Glucose: 84 mg/dL (ref 65–99)
Potassium: 4.5 mmol/L (ref 3.5–5.2)
Sodium: 139 mmol/L (ref 134–144)

## 2020-08-03 MED ORDER — ALBUTEROL SULFATE (2.5 MG/3ML) 0.083% IN NEBU: 2.5000 mg | INHALATION_SOLUTION | Freq: Three times a day (TID) | RESPIRATORY_TRACT | 4 refills | Status: DC

## 2020-08-03 NOTE — Addendum Note (Signed)
Addended by: Dorisann Frames R on: 08/03/2020 10:57 AM   Modules accepted: Orders

## 2020-08-03 NOTE — Assessment & Plan Note (Signed)
Continue with activity as tolerated.  Consider repeat PT going forward.  Does not want to go to Pulmonary rehab.

## 2020-08-03 NOTE — Progress Notes (Signed)
@Patient  ID: , female    DOB: 09/26/1940, 80 y.o.   MRN: [id number]  Chief Complaint  Patient presents with  . Follow-up    COPD     Referring provider: [id number], MD  HPI: 80 year old female former smoker followed for severe COPD, chronic respiratory failure on oxygen since 2010 and lung nodules   Medical history significant for previous nonischemic cardiomyopathy, diabetes and hypertension Lives in independent living at 2011 states  TEST/EVENTS :  Spirometry7/2019showed severe airway obstruction with ratio 43, FEV1 of 0.57-31% and FVC of 53%.  CT angio 06/18/18 LUL nodule 13mm  CT chest on February 16, 2019 that showed a stable 1.2 cm left thyroid nodule. Severe emphysema. Irregular 6 mm left upper lobe pulmonary nodule-stable and stable right upper lobe 5 mm pulmonary nodule.  Overnight oximetry test September 2021 showed no desaturations on 5 L of oxygen.  CT chest 09/2019 -No CT evidence of pulmonary embolism. 2. Marked severity emphysematous lung disease.(no mention of lung nodules )   CT chest January 12, 2020 negative for PE.  Small sliding hiatal hernia.  Emphysematous changes.  Basilar atelectasis. (no mention of lung nodules )   08/03/2020 Follow up ; COPD , O2RF  Patient presents for a 33-month follow-up.  Patient has underlying severe COPD and chronic hypoxic and hypercarbic respiratory failure on oxygen she is prone to frequent exacerbations and hospitalizations.  Last hospitalization was June 2021.  She was admitted for COPD exacerbation hypercarbic respiratory failure.  She was recommended for nocturnal BiPAP/noninvasive vent at bedtime to help with exacerbation prevention.  However insurance declined coverage. She has completed physical therapy and Occupational Therapy. She remains on budesonide and Brovana nebulizers twice daily and DuoNeb 3 times daily.  Previously on Anoro felt that this worked well but insurance will not cover.   Previously tried on July 2021 but did not tolerate. Since last visit patient says overall is doing the same.  Continues to get short of breath with minimal activity.  She remains on oxygen 5 L at rest and 6 L with activity.  No increased oxygen demands. Dyspnea is her main complaint, limits her activities. Says since doing PT/OT feels she has same dyspnea on exertion but seems to recover little faster.  Has good days and bad days .   She has recently been seen by cardiology and has an upcoming echo and stress test pending for evaluation of previous cardiomyopathy.  She was started on losartan for blood pressure control.  And continued on her maintenance cardiac meds.  Flu shot up-to-date.  COVID-vaccine x3 is up-to-date.   Allergies  Allergen Reactions  . Rosuvastatin Calcium Other (See Comments)  . Simvastatin Other (See Comments)  . Amoxicillin Rash  . Lisinopril Rash  . Sulfacetamide Rash  . Tetracyclines & Related Rash    Immunization History  Administered Date(s) Administered  . Fluad Quad(high Dose 65+) 04/07/2020  . Influenza Split 04/14/2018, 03/16/2019, 04/07/2019  . Influenza, High Dose Seasonal PF 04/14/2018, 05/01/2019  . Moderna Sars-Covid-2 Vaccination 08/16/2019, 09/13/2019, 05/22/2020  . Pneumococcal Conjugate-13 11/15/2016, 02/05/2018  . Tdap 07/27/2018  . Zoster 07/14/2018, 09/14/2018  . Zoster Recombinat (Shingrix) 07/14/2018, 09/14/2018    Past Medical History:  Diagnosis Date  . Congestive heart disease (HCC)   . COPD (chronic obstructive pulmonary disease) (HCC)   . Diabetes (HCC)   . Hyperlipemia   . Osteoporosis   . Primary hypertension     Tobacco History: Social History   Tobacco Use  Smoking  Status Former Smoker  . Packs/day: 1.50  . Years: 40.00  . Pack years: 60.00  . Types: Cigarettes  . Quit date: 2003  . Years since quitting: 19.0  Smokeless Tobacco Never Used   Counseling given: Not Answered   Outpatient Medications Prior to  Visit  Medication Sig Dispense Refill  . acetaminophen (TYLENOL) 500 MG tablet Take 1,000 mg by mouth 2 (two) times daily as needed for mild pain or headache.    . albuterol (PROVENTIL) (2.5 MG/3ML) 0.083% nebulizer solution Take 3 mLs (2.5 mg total) by nebulization in the morning, at noon, and at bedtime. USE AS DIRECTED 225 mL 4  . albuterol (VENTOLIN HFA) 108 (90 Base) MCG/ACT inhaler Inhale 1-2 puffs into the lungs every 6 (six) hours as needed for wheezing or shortness of breath. 8 g 5  . BROVANA 15 MCG/2ML NEBU USE 1 VIAL  IN  NEBULIZER TWICE  DAILY - morning and evening 2 mL 11  . budesonide (PULMICORT) 0.5 MG/2ML nebulizer solution USE 1 VIAL  IN  NEBULIZER TWICE  DAILY - Rinse mouth after treatment 2 mL 11  . Calcium Carbonate-Vitamin D 600-200 MG-UNIT TABS Take 1 tablet by mouth daily.     . carvedilol (COREG) 25 MG tablet Take 25 mg by mouth 2 (two) times daily.     Marland Kitchen estradiol (ESTRACE) 0.1 MG/GM vaginal cream Place 1 Applicatorful vaginally every other day.     . fluticasone (FLONASE) 50 MCG/ACT nasal spray Place 2 sprays into both nostrils daily as needed for allergies or rhinitis.    . furosemide (LASIX) 20 MG tablet Take 20 mg by mouth daily as needed for fluid.    Marland Kitchen ipratropium (ATROVENT) 0.02 % nebulizer solution USE 1 VIAL IN NEBULIZER 3 TIMES DAILY 225 mL 5  . losartan (COZAAR) 50 MG tablet Take 1 tablet (50 mg total) by mouth daily. 90 tablet 3  . metFORMIN (GLUCOPHAGE) 500 MG tablet Take 500 mg by mouth daily with supper.     . Potassium Chloride ER 20 MEQ TBCR Take 1 tablet by mouth daily as needed for fluid. When you take furosemide    . pravastatin (PRAVACHOL) 10 MG tablet Take 10 mg by mouth every other day.      No facility-administered medications prior to visit.     Review of Systems:   Constitutional:   No  weight loss, night sweats,  Fevers, chills, + fatigue, or  lassitude.  HEENT:   No headaches,  Difficulty swallowing,  Tooth/dental problems, or  Sore  throat,                No sneezing, itching, ear ache, nasal congestion, post nasal drip,   CV:  No chest pain,  Orthopnea, PND, swelling in lower extremities, anasarca, dizziness, palpitations, syncope.   GI  No heartburn, indigestion, abdominal pain, nausea, vomiting, diarrhea, change in bowel habits, loss of appetite, bloody stools.   Resp:   No chest wall deformity  Skin: no rash or lesions.  GU: no dysuria, change in color of urine, no urgency or frequency.  No flank pain, no hematuria   MS:  No joint pain or swelling.  No decreased range of motion.  No back pain.    Physical Exam  BP (!) 142/80 (BP Location: Left Arm, Cuff Size: Normal)   Pulse 76   Temp 97.9 F (36.6 C)   Ht 5\' 2"  (1.575 m)   Wt 152 lb 6.4 oz (69.1 kg)   SpO2 98%  BMI 27.87 kg/m   GEN: A/Ox3; pleasant , NAD, well nourished , on O2 , rolling walker    HEENT:  [place]/AT,   NOSE-clear, THROAT-clear, no lesions, no postnasal drip or exudate noted.   NECK:  Supple w/ fair ROM; no JVD; normal carotid impulses w/o bruits; no thyromegaly or nodules palpated; no lymphadenopathy.    RESP  Clear  P & A;-decreased BS in bases  w/o, wheezes/ rales/ or rhonchi. no accessory muscle use, no dullness to percussion  CARD:  RRR, no m/r/g, tr  peripheral edema, pulses intact, no cyanosis or clubbing.  GI:   Soft & nt; nml bowel sounds; no organomegaly or masses detected.   Musco: Warm bil, no deformities or joint swelling noted.   Neuro: alert, no focal deficits noted.    Skin: Warm, no lesions or rashes    Lab Results:   BNP  Imaging: No results found.    No flowsheet data found.  No results found for: NITRICOXIDE      Assessment & Plan:   COPD with chronic bronchitis and emphysema (HCC) Currently stable on present regimen   Plan  Patient Instructions  Continue on Budesonide Neb Twice daily  .  Continue on Brovana Twice daily  .  Continue on Albuterol and Ipratropium Neb Three times a day    Activity as tolerated Continue on oxygen 5l/m rest and 6l/m activity . O2 sats goal >88-90%.  Follow-up with Dr. Vassie Loll or Maly Lemarr NP in 3 months and As needed   Please contact office for sooner follow up if symptoms do not improve or worsen or seek emergency care       Chronic respiratory failure with hypoxia (HCC) Stable on current O2 levels without increased demands   Plan  Patient Instructions  Continue on Budesonide Neb Twice daily  .  Continue on Brovana Twice daily  .  Continue on Albuterol and Ipratropium Neb Three times a day   Activity as tolerated Continue on oxygen 5l/m rest and 6l/m activity . O2 sats goal >88-90%.  Follow-up with Dr. Vassie Loll or Briley Sulton NP in 3 months and As needed   Please contact office for sooner follow up if symptoms do not improve or worsen or seek emergency care       Nonischemic cardiomyopathy (HCC) Appears euvolemic on exam  Continue follow up with cardiology   Physical deconditioning Continue with activity as tolerated.  Consider repeat PT going forward.  Does not want to go to Pulmonary rehab.      Rubye Oaks, NP 08/03/2020

## 2020-08-03 NOTE — Patient Instructions (Signed)
Continue on Budesonide Neb Twice daily  .  Continue on Brovana Twice daily  .  Continue on Albuterol and Ipratropium Neb Three times a day   Activity as tolerated Continue on oxygen 5l/m rest and 6l/m activity . O2 sats goal >88-90%.  Follow-up with Dr. Vassie Loll or Kebin Maye NP in 3 months and As needed   Please contact office for sooner follow up if symptoms do not improve or worsen or seek emergency care

## 2020-08-03 NOTE — Assessment & Plan Note (Signed)
Appears euvolemic on exam.  Continue follow-up with cardiology 

## 2020-08-03 NOTE — Assessment & Plan Note (Signed)
Currently stable on present regimen   Plan  Patient Instructions  Continue on Budesonide Neb Twice daily  .  Continue on Brovana Twice daily  .  Continue on Albuterol and Ipratropium Neb Three times a day   Activity as tolerated Continue on oxygen 5l/m rest and 6l/m activity . O2 sats goal >88-90%.  Follow-up with Dr. Vassie Loll or Lafayette Dunlevy NP in 3 months and As needed   Please contact office for sooner follow up if symptoms do not improve or worsen or seek emergency care

## 2020-08-03 NOTE — Assessment & Plan Note (Signed)
Stable on current O2 levels without increased demands   Plan  Patient Instructions  Continue on Budesonide Neb Twice daily  .  Continue on Brovana Twice daily  .  Continue on Albuterol and Ipratropium Neb Three times a day   Activity as tolerated Continue on oxygen 5l/m rest and 6l/m activity . O2 sats goal >88-90%.  Follow-up with Dr. Vassie Loll or Travion Ke NP in 3 months and As needed   Please contact office for sooner follow up if symptoms do not improve or worsen or seek emergency care

## 2020-08-04 ENCOUNTER — Telehealth: Payer: Self-pay

## 2020-08-04 ENCOUNTER — Telehealth: Payer: Self-pay | Admitting: Adult Health

## 2020-08-04 NOTE — Telephone Encounter (Signed)
I have refaxed it to [facility] with the date

## 2020-08-04 NOTE — Telephone Encounter (Signed)
I will go find and refax

## 2020-08-04 NOTE — Telephone Encounter (Signed)
Spoke with pt and advised of lab results per Dr Shari Prows.  Pt reports her BP yesterday was 140's/70's.  Pt states she does not want to increase [place] yet as she has only been taking x 2 days.  Pt advised importance of keeping log of BP's.  Will forward information to Dr Shari Prows.  Pt verbalizes understanding and thanked Charity fundraiser for the call.

## 2020-08-04 NOTE — Telephone Encounter (Signed)
-----   Message from Meriam Sprague, MD sent at 08/04/2020  1:37 PM EST ----- Electrolytes and renal function look great. How are her blood pressures at home? If still >130s/80s, we can up-titrate losartan to 75mg  daily and repeat labs before next visit.

## 2020-08-07 ENCOUNTER — Telehealth: Payer: Self-pay | Admitting: Adult Health

## 2020-08-07 ENCOUNTER — Ambulatory Visit (HOSPITAL_COMMUNITY): Payer: Medicare HMO | Attending: Cardiovascular Disease

## 2020-08-07 ENCOUNTER — Other Ambulatory Visit: Payer: Self-pay

## 2020-08-07 DIAGNOSIS — R0602 Shortness of breath: Secondary | ICD-10-CM | POA: Insufficient documentation

## 2020-08-07 LAB — MYOCARDIAL PERFUSION IMAGING
LV dias vol: 82 mL (ref 46–106)
LV sys vol: 44 mL
Peak HR: 95 {beats}/min
Rest HR: 81 {beats}/min
SDS: 3
SRS: 2
SSS: 5
TID: 1.05

## 2020-08-07 MED ORDER — REGADENOSON 0.4 MG/5ML IV SOLN
0.4000 mg | Freq: Once | INTRAVENOUS | Status: AC
  Administered 2020-08-07: 0.4 mg via INTRAVENOUS

## 2020-08-07 MED ORDER — BUDESONIDE 0.5 MG/2ML IN SUSP: RESPIRATORY_TRACT | 11 refills | Status: DC

## 2020-08-07 MED ORDER — TECHNETIUM TC 99M TETROFOSMIN IV KIT
30.3000 | PACK | Freq: Once | INTRAVENOUS | Status: AC | PRN
  Administered 2020-08-07: 30.3 via INTRAVENOUS
  Filled 2020-08-07: qty 31

## 2020-08-07 MED ORDER — TECHNETIUM TC 99M TETROFOSMIN IV KIT
9.0000 | PACK | Freq: Once | INTRAVENOUS | Status: AC | PRN
  Administered 2020-08-07: 9 via INTRAVENOUS
  Filled 2020-08-07: qty 9

## 2020-08-07 NOTE — Telephone Encounter (Signed)
A refill for Budesonide nebulizer was electronitically sent to [facility]. Nothing further needed. Patient aware.

## 2020-08-08 ENCOUNTER — Telehealth: Payer: Self-pay | Admitting: Adult Health

## 2020-08-08 DIAGNOSIS — J9611 Chronic respiratory failure with hypoxia: Secondary | ICD-10-CM | POA: Diagnosis not present

## 2020-08-08 DIAGNOSIS — J432 Centrilobular emphysema: Secondary | ICD-10-CM | POA: Diagnosis not present

## 2020-08-08 DIAGNOSIS — J449 Chronic obstructive pulmonary disease, unspecified: Secondary | ICD-10-CM | POA: Diagnosis not present

## 2020-08-08 MED ORDER — BUDESONIDE 0.5 MG/2ML IN SUSP: RESPIRATORY_TRACT | 11 refills | Status: DC

## 2020-08-08 NOTE — Telephone Encounter (Signed)
Called and spoke to [place] with [facility] and was advised the quantity for the Pulmicort was only 96ml for 30 days. New rx sent to [facility]. Marchelle Folks verbalized understanding and denied any further questions or concerns at this time.    Will forward to [facility] as the order form for this medication is no longer needed.

## 2020-08-09 DIAGNOSIS — J449 Chronic obstructive pulmonary disease, unspecified: Secondary | ICD-10-CM | POA: Diagnosis not present

## 2020-08-11 ENCOUNTER — Telehealth: Payer: Self-pay | Admitting: Adult Health

## 2020-08-11 NOTE — Telephone Encounter (Signed)
08/11/20  Called and spoke with patient. Patient wondering if she is okay for her to go ahead and resume budesonide. She reports that she already has taken the dose. Nothing further needed.  Elisha Headland, FNP

## 2020-08-14 ENCOUNTER — Other Ambulatory Visit: Payer: Self-pay | Admitting: Adult Health

## 2020-08-17 DIAGNOSIS — J449 Chronic obstructive pulmonary disease, unspecified: Secondary | ICD-10-CM | POA: Diagnosis not present

## 2020-08-17 DIAGNOSIS — J432 Centrilobular emphysema: Secondary | ICD-10-CM | POA: Diagnosis not present

## 2020-08-17 DIAGNOSIS — J9611 Chronic respiratory failure with hypoxia: Secondary | ICD-10-CM | POA: Diagnosis not present

## 2020-08-18 ENCOUNTER — Ambulatory Visit (HOSPITAL_COMMUNITY): Payer: Medicare HMO | Attending: Internal Medicine

## 2020-08-18 ENCOUNTER — Other Ambulatory Visit: Payer: Self-pay

## 2020-08-18 DIAGNOSIS — J449 Chronic obstructive pulmonary disease, unspecified: Secondary | ICD-10-CM | POA: Diagnosis not present

## 2020-08-18 DIAGNOSIS — R0602 Shortness of breath: Secondary | ICD-10-CM | POA: Insufficient documentation

## 2020-08-18 LAB — ECHOCARDIOGRAM COMPLETE
Area-P 1/2: 4.21 cm2
S' Lateral: 2.6 cm

## 2020-08-25 DIAGNOSIS — J9611 Chronic respiratory failure with hypoxia: Secondary | ICD-10-CM | POA: Diagnosis not present

## 2020-08-25 DIAGNOSIS — Z0000 Encounter for general adult medical examination without abnormal findings: Secondary | ICD-10-CM | POA: Diagnosis not present

## 2020-08-25 DIAGNOSIS — H6120 Impacted cerumen, unspecified ear: Secondary | ICD-10-CM | POA: Diagnosis not present

## 2020-08-25 DIAGNOSIS — I509 Heart failure, unspecified: Secondary | ICD-10-CM | POA: Diagnosis not present

## 2020-08-25 DIAGNOSIS — E1169 Type 2 diabetes mellitus with other specified complication: Secondary | ICD-10-CM | POA: Diagnosis not present

## 2020-08-25 DIAGNOSIS — R0981 Nasal congestion: Secondary | ICD-10-CM | POA: Diagnosis not present

## 2020-08-25 DIAGNOSIS — I10 Essential (primary) hypertension: Secondary | ICD-10-CM | POA: Diagnosis not present

## 2020-08-25 DIAGNOSIS — I110 Hypertensive heart disease with heart failure: Secondary | ICD-10-CM | POA: Diagnosis not present

## 2020-08-25 DIAGNOSIS — E785 Hyperlipidemia, unspecified: Secondary | ICD-10-CM | POA: Diagnosis not present

## 2020-08-25 DIAGNOSIS — J449 Chronic obstructive pulmonary disease, unspecified: Secondary | ICD-10-CM | POA: Diagnosis not present

## 2020-08-29 DIAGNOSIS — E785 Hyperlipidemia, unspecified: Secondary | ICD-10-CM | POA: Diagnosis not present

## 2020-08-29 DIAGNOSIS — Z0000 Encounter for general adult medical examination without abnormal findings: Secondary | ICD-10-CM | POA: Diagnosis not present

## 2020-08-29 DIAGNOSIS — J449 Chronic obstructive pulmonary disease, unspecified: Secondary | ICD-10-CM | POA: Diagnosis not present

## 2020-09-08 DIAGNOSIS — J9611 Chronic respiratory failure with hypoxia: Secondary | ICD-10-CM | POA: Diagnosis not present

## 2020-09-08 DIAGNOSIS — J432 Centrilobular emphysema: Secondary | ICD-10-CM | POA: Diagnosis not present

## 2020-09-08 DIAGNOSIS — J449 Chronic obstructive pulmonary disease, unspecified: Secondary | ICD-10-CM | POA: Diagnosis not present

## 2020-09-12 ENCOUNTER — Telehealth: Payer: Self-pay | Admitting: Pulmonary Disease

## 2020-09-12 NOTE — Telephone Encounter (Signed)
09/12/20  Contacted patient. Patient able to speak in complete sentences. Patient reporting that primary care did lab work 2 weeks ago and they wanted the patient to call our office since her CO2 level has gone from 32-41 based off this lab work.  Patient reports that respiratory symptoms are stable. She does report that with physical exertion her oxygen levels on 5-1/2 L do dropped to 79%. Patient reports that this is her baseline. Patient is not concerned regarding the symptoms. Patient reports that she is maintained on 5.5 L of O2 24/7.  Patient does not feel that she has any brain fog, confusion, or worsened respiratory symptoms.  I offered the patient an appointment today. She declined. Offered to patient an appointment later on this week. Patient declined. Patient reports that she needs a Tuesday or a Thursday office visit in the morning. She has been scheduled to see TP NP on 09/26/2020 at 9:30 AM. This is the 1st available Tuesday morning appointment. Patient has  Seen TP NP in the past.   This is been an ongoing chronic issue for the patient. She reports that she has been tried on BiPAP in the past and was intolerant of this.  We will route message to TP NP as FYI. We will also route message to Dr. Vassie Loll as Lorain Childes.  Elisha Headland, FNP

## 2020-09-12 NOTE — Telephone Encounter (Signed)
She likely has chronic hypoxic/hypercarbic respiratory failure So do expect that serum bicarbonate will run high in the 36-40 range as noted on all her BMETs in the past The only treatment that will help with this is  biPAP/ NIV not tolerated this in the past and does not want this based on prior conversations

## 2020-09-14 DIAGNOSIS — J449 Chronic obstructive pulmonary disease, unspecified: Secondary | ICD-10-CM | POA: Diagnosis not present

## 2020-09-14 DIAGNOSIS — J9611 Chronic respiratory failure with hypoxia: Secondary | ICD-10-CM | POA: Diagnosis not present

## 2020-09-14 DIAGNOSIS — J432 Centrilobular emphysema: Secondary | ICD-10-CM | POA: Diagnosis not present

## 2020-09-19 DIAGNOSIS — J449 Chronic obstructive pulmonary disease, unspecified: Secondary | ICD-10-CM | POA: Diagnosis not present

## 2020-09-21 DIAGNOSIS — J449 Chronic obstructive pulmonary disease, unspecified: Secondary | ICD-10-CM | POA: Diagnosis not present

## 2020-09-26 ENCOUNTER — Ambulatory Visit: Payer: Medicare HMO | Admitting: Adult Health

## 2020-09-26 ENCOUNTER — Other Ambulatory Visit: Payer: Self-pay

## 2020-09-26 ENCOUNTER — Encounter: Payer: Self-pay | Admitting: Adult Health

## 2020-09-26 DIAGNOSIS — J9611 Chronic respiratory failure with hypoxia: Secondary | ICD-10-CM

## 2020-09-26 DIAGNOSIS — J449 Chronic obstructive pulmonary disease, unspecified: Secondary | ICD-10-CM | POA: Diagnosis not present

## 2020-09-26 DIAGNOSIS — I428 Other cardiomyopathies: Secondary | ICD-10-CM | POA: Diagnosis not present

## 2020-09-26 DIAGNOSIS — R5381 Other malaise: Secondary | ICD-10-CM | POA: Diagnosis not present

## 2020-09-26 MED ORDER — PREDNISONE 20 MG PO TABS: 20.0000 mg | ORAL_TABLET | Freq: Every day | ORAL | 0 refills | Status: DC

## 2020-09-26 NOTE — Patient Instructions (Addendum)
Continue on Budesonide Neb Twice daily  .  Continue on Brovana Twice daily  .  Continue on Albuterol and Ipratropium Neb Three times a day   Activity as tolerated Saline nasal spray Twice daily   Saline nasal gel At bedtime   Incentive spirometry daily  Prednisone 20mg  daily for 5 days .  Continue on oxygen 5l/m rest and 6l/m activity . O2 sats goal >88-90%.  PT at home.  Small lightweight wheelchair  Follow-up with Dr. or Mariela Rex NP in 2-3 months and As needed  Please contact office for sooner follow up if symptoms do not improve or worsen or seek emergency care

## 2020-09-26 NOTE — Addendum Note (Signed)
Addended by: Delrae Rend on: 09/26/2020 11:26 AM   Modules accepted: Orders

## 2020-09-26 NOTE — Assessment & Plan Note (Signed)
No evidence of volume overload on exam.  Continue follow-up with cardiology as planned.

## 2020-09-26 NOTE — Assessment & Plan Note (Signed)
Patient has severe deconditioning.  Has low activity tolerance.  Would benefit from physical therapy at home to help with strengthening.  Also will need a wheelchair for mobility as when she goes to appointments it is very difficult for her to get anywhere due to high oxygen requirements and inability to walk for any amount of distance.  Plan  Patient Instructions  Continue on Budesonide Neb Twice daily  .  Continue on Brovana Twice daily  .  Continue on Albuterol and Ipratropium Neb Three times a day   Activity as tolerated Saline nasal spray Twice daily   Saline nasal gel At bedtime   Incentive spirometry daily  Prednisone 20mg  daily for 5 days .  Continue on oxygen 5l/m rest and 6l/m activity . O2 sats goal >88-90%.  PT at home.  Small lightweight wheelchair  Follow-up with Dr. or Dawt Reeb NP in 2-3 months and As needed  Please contact office for sooner follow up if symptoms do not improve or worsen or seek emergency care

## 2020-09-26 NOTE — Progress Notes (Signed)
@Patient  ID: , female    DOB: Jun 22, 1941, 80 y.o.   MRN: [id number]  Chief Complaint  Patient presents with  . Follow-up    Referring provider: [id number], MD  HPI: 80 year old female former smoker followed for severe COPD and chronic respiratory failure on oxygen since 2010 and lung nodules Medical history significant for nonischemic cardiomyopathy, diabetes and hypertension Lives in independent living at [facility]  TEST/EVENTS :  Spirometry7/2019showed severe airway obstruction with ratio 43, FEV1 of 0.57-31% and FVC of 53%.  CT angio 06/18/18 LUL nodule 7mm  CT chest on February 16, 2019 that showed a stable 1.2 cm left thyroid nodule. Severe emphysema. Irregular 6 mm left upper lobe pulmonary nodule-stable and stable right upper lobe 5 mm pulmonary nodule.  Overnight oximetry test September 2021 showed no desaturations on 5 L of oxygen.  CT chest 09/2019 -No CT evidence of pulmonary embolism. 2. Marked severity emphysematous lung disease.(no mention of lung nodules )   CT chest June 30, 2021negativefor PE. Small sliding hiatal hernia. Emphysematous changes. Basilar atelectasis. (no mention of lung nodules )  Chest x-ray February 21, 2020 interstitial thickening of the lung bases and emphysema in the upper lobes.   09/26/2020 Follow up : COPD , O2 RF  Patient returns for a 40-month follow-up.  Patient is accompanied by her daughter.  Patient has underlying severe COPD and chronic hypoxic and hypercarbic respiratory failure on high flow oxygen.  She has high symptom burden.  She has been prone to exacerbations and hospitalizations.   Last hospitalization was June 2021 admitted for COPD exacerbation and hypercarbic respiratory failure.  She was recommended for BiPAP/noninvasive ventilator support at bedtime.  Insurance declined coverage and patient declined usage.  Says she just does not feel like she can wear this going forward.. Patient says  she has been getting along okay just feeling that she gets short of breath with minimal activity.  Very frustrated of how limited she is with her high oxygen levels and immobility.  Had finished physical therapy last year.  But says her stamina is going back down.  Recent lab work at her primary care provider showed that her bicarb level on electrolyte panel was elevated at 42.  Patient denies any headaches, lethargy, confusion. She says over the last few weeks is felt that breathing was not as good as it had been.  She continues to have some good days and bad days.  Currently on 5 L at rest 6 L with activity.  She is currently on budesonide and Brovana nebulizers twice daily and DuoNeb 3 times daily.  She says recently she has started on her incentive spirometry which she does feel it has helped some. Discussion today with patient and daughter regarding goals of care, patient education on disease progression, end-stage COPD treatment options.  Did discuss options of palliative care and hospice as we may be faced going forward.. Patient says she would like to be more active but has a hard time going out due to mobility , discussed wheelchair . She has walker but can not walk any distance without severe weakness and dyspnea.  She has a history of nonischemic cardiomyopathy.  She has been seen by cardiology recent.  Echo August 18, 2020 showed EF at 40 to 45%, grade 1 diastolic dysfunction.  Right ventricular systolic function was normal and right ventricular size was normal. Stress Myoview on August 07, 2020 showed EF around 46%.  No evidence of ischemia noted.  Allergies  Allergen Reactions  . Rosuvastatin Calcium Other (See Comments)  . Simvastatin Other (See Comments)  . Amoxicillin Rash  . Lisinopril Rash  . Sulfacetamide Rash  . Tetracyclines & Related Rash    Immunization History  Administered Date(s) Administered  . Fluad Quad(high Dose 65+) 04/07/2020  . Influenza Split 04/14/2018,  03/16/2019, 04/07/2019  . Influenza, High Dose Seasonal PF 04/14/2018, 05/01/2019  . Moderna Sars-Covid-2 Vaccination 08/16/2019, 09/13/2019, 05/22/2020  . Pneumococcal Conjugate-13 11/15/2016, 02/05/2018  . Tdap 07/27/2018  . Zoster 07/14/2018, 09/14/2018  . Zoster Recombinat (Shingrix) 07/14/2018, 09/14/2018    Past Medical History:  Diagnosis Date  . Congestive heart disease (HCC)   . COPD (chronic obstructive pulmonary disease) (HCC)   . Diabetes (HCC)   . Hyperlipemia   . Osteoporosis   . Primary hypertension     Tobacco History: Social History   Tobacco Use  Smoking Status Former Smoker  . Packs/day: 1.50  . Years: 40.00  . Pack years: 60.00  . Types: Cigarettes  . Quit date: 2003  . Years since quitting: 19.2  Smokeless Tobacco Never Used   Counseling given: Not Answered   Outpatient Medications Prior to Visit  Medication Sig Dispense Refill  . acetaminophen (TYLENOL) 500 MG tablet Take 1,000 mg by mouth 2 (two) times daily as needed for mild pain or headache.    . albuterol (PROVENTIL) (2.5 MG/3ML) 0.083% nebulizer solution TAKE 3 MLS (2.5 MG TOTAL) BY NEBULIZATION IN THE MORNING, AT NOON, AND AT BEDTIME. USE AS DIRECTED 225 mL 4  . albuterol (VENTOLIN HFA) 108 (90 Base) MCG/ACT inhaler Inhale 1-2 puffs into the lungs every 6 (six) hours as needed for wheezing or shortness of breath. 8 g 5  . BROVANA 15 MCG/2ML NEBU USE 1 VIAL  IN  NEBULIZER TWICE  DAILY - morning and evening 2 mL 11  . budesonide (PULMICORT) 0.5 MG/2ML nebulizer solution USE 1 VIAL  IN  NEBULIZER TWICE  DAILY - Rinse mouth after treatment 120 mL 11  . Calcium Carbonate-Vitamin D 600-200 MG-UNIT TABS Take 1 tablet by mouth daily.     . carvedilol (COREG) 25 MG tablet Take 25 mg by mouth 2 (two) times daily.     Marland Kitchen estradiol (ESTRACE) 0.1 MG/GM vaginal cream Place 1 Applicatorful vaginally every other day.     . fluticasone (FLONASE) 50 MCG/ACT nasal spray Place 2 sprays into both nostrils daily as  needed for allergies or rhinitis.    . furosemide (LASIX) 20 MG tablet Take 20 mg by mouth daily as needed for fluid.    Marland Kitchen ipratropium (ATROVENT) 0.02 % nebulizer solution USE 1 VIAL IN NEBULIZER 3 TIMES DAILY 225 mL 5  . losartan (COZAAR) 50 MG tablet Take 1 tablet (50 mg total) by mouth daily. 90 tablet 3  . metFORMIN (GLUCOPHAGE) 500 MG tablet Take 500 mg by mouth daily with supper.     . Potassium Chloride ER 20 MEQ TBCR Take 1 tablet by mouth daily as needed for fluid. When you take furosemide    . pravastatin (PRAVACHOL) 10 MG tablet Take 10 mg by mouth every other day.      No facility-administered medications prior to visit.     Review of Systems:   Constitutional:   No  weight loss, night sweats,  Fevers, chills,  +fatigue, or  lassitude.  HEENT:   No headaches,  Difficulty swallowing,  Tooth/dental problems, or  Sore throat,  No sneezing, itching, ear ache,  +nasal congestion, post nasal drip,   CV:  No chest pain,  Orthopnea, PND, swelling in lower extremities, anasarca, dizziness, palpitations, syncope.   GI  No heartburn, indigestion, abdominal pain, nausea, vomiting, diarrhea, change in bowel habits, loss of appetite, bloody stools.   Resp:    No chest wall deformity  Skin: no rash or lesions.  GU: no dysuria, change in color of urine, no urgency or frequency.  No flank pain, no hematuria   MS:  No joint pain or swelling.  No decreased range of motion.  No back pain.    Physical Exam  BP 130/70 (BP Location: Left Arm, Patient Position: Sitting, Cuff Size: Normal)   Pulse 83   Temp (!) 97.1 F (36.2 C) (Temporal)   Ht 5\' 2"  (1.575 m)   Wt 154 lb 3.2 oz (69.9 kg)   SpO2 93% Comment: 2L continuous  BMI 28.20 kg/m   GEN: A/Ox3; pleasant , NAD, chronically ill woman on oxygen   HEENT:  [place]/AT, NOSE-clear, THROAT-clear, no lesions, no postnasal drip or exudate noted.   NECK:  Supple w/ fair ROM; no JVD; normal carotid impulses w/o bruits; no  thyromegaly or nodules palpated; no lymphadenopathy.    RESP   decreased breath sounds in the bases. no accessory muscle use, no dullness to percussion  CARD:  RRR, no m/r/g, no peripheral edema, pulses intact, no cyanosis or clubbing.  GI:   Soft & nt; nml bowel sounds; no organomegaly or masses detected.   Musco: Warm bil, no deformities or joint swelling noted.   Neuro: alert, no focal deficits noted.    Skin: Warm, no lesions or rashes    Lab Results:  CBC   BMET   Imaging: No results found.  regadenoson (LEXISCAN) injection SOLN 0.4 mg    Date Action Dose Route User   08/07/2020 1235 Given 0.4 mg Intravenous Henrietta Dine    technetium tetrofosmin (TC-MYOVIEW) injection 9 millicurie    Date Action Dose Route User   08/07/2020 1050 Contrast Given 9 millicurie Intravenous Doyne Keel M    technetium tetrofosmin (TC-MYOVIEW) injection 30.3 millicurie    Date Action Dose Route User   08/07/2020 1235 Contrast Given 30.3 millicurie Intravenous Doyne Keel M      No flowsheet data found.  No results found for: NITRICOXIDE      Assessment & Plan:   COPD with chronic bronchitis and emphysema (HCC) Severe COPD that is oxygen dependent-patient has high symptom burden.  She multiple comorbidities including cardiomyopathy.  No evidence of volume overload on exam.  Patient has associated hypoxic and hypercarbic respiratory failure.  She declines BiPAP for noninvasive support going forward.  We will continue on her aggressive maintenance regimen with triple therapy.  Continue on incentive spirometer.  Add in physical therapy to help with severe deconditioning.   Short course of low-dose empiric steroids to see if this helps. Goals of care we discussion regarding palliative care and hospice-with patient education to patient and daughter.  Plan  Patient Instructions  Continue on Budesonide Neb Twice daily  .  Continue on Brovana Twice daily  .  Continue on Albuterol  and Ipratropium Neb Three times a day   Activity as tolerated Saline nasal spray Twice daily   Saline nasal gel At bedtime   Incentive spirometry daily  Prednisone 20mg  daily for 5 days .  Continue on oxygen 5l/m rest and 6l/m activity . O2 sats goal >88-90%.  PT  at home.  Small lightweight wheelchair  Follow-up with Dr. Vassie Loll or Parrett NP in 2-3 months and As needed  Please contact office for sooner follow up if symptoms do not improve or worsen or seek emergency care       Chronic respiratory failure with hypoxia (HCC) Hypercarbic and hypoxic respiratory failure.  Patient has known hypercarbia.  Was evaluated for BiPAP support at bedtime.  This was initially declined by insurance.  Patient does decline going forward with this as she says she just cannot wear this long-term. For now continue on oxygen to maintain O2 saturations greater than 88 to 90%.  Plan  Patient Instructions  Continue on Budesonide Neb Twice daily  .  Continue on Brovana Twice daily  .  Continue on Albuterol and Ipratropium Neb Three times a day   Activity as tolerated Saline nasal spray Twice daily   Saline nasal gel At bedtime   Incentive spirometry daily  Prednisone 20mg  daily for 5 days .  Continue on oxygen 5l/m rest and 6l/m activity . O2 sats goal >88-90%.  PT at home.  Small lightweight wheelchair  Follow-up with Dr. or Parrett NP in 2-3 months and As needed  Please contact office for sooner follow up if symptoms do not improve or worsen or seek emergency care       Nonischemic cardiomyopathy (HCC) No evidence of volume overload on exam.  Continue follow-up with cardiology as planned.  Physical deconditioning Patient has severe deconditioning.  Has low activity tolerance.  Would benefit from physical therapy at home to help with strengthening.  Also will need a wheelchair for mobility as when she goes to appointments it is very difficult for her to get anywhere due to high oxygen  requirements and inability to walk for any amount of distance.  Plan  Patient Instructions  Continue on Budesonide Neb Twice daily  .  Continue on Brovana Twice daily  .  Continue on Albuterol and Ipratropium Neb Three times a day   Activity as tolerated Saline nasal spray Twice daily   Saline nasal gel At bedtime   Incentive spirometry daily  Prednisone 20mg  daily for 5 days .  Continue on oxygen 5l/m rest and 6l/m activity . O2 sats goal >88-90%.  PT at home.  Small lightweight wheelchair  Follow-up with Dr. Vassie Loll or Parrett NP in 2-3 months and As needed  Please contact office for sooner follow up if symptoms do not improve or worsen or seek emergency care          , NP 09/26/2020

## 2020-09-26 NOTE — Assessment & Plan Note (Signed)
Hypercarbic and hypoxic respiratory failure.  Patient has known hypercarbia.  Was evaluated for BiPAP support at bedtime.  This was initially declined by insurance.  Patient does decline going forward with this as she says she just cannot wear this long-term. For now continue on oxygen to maintain O2 saturations greater than 88 to 90%.  Plan  Patient Instructions  Continue on Budesonide Neb Twice daily  .  Continue on Brovana Twice daily  .  Continue on Albuterol and Ipratropium Neb Three times a day   Activity as tolerated Saline nasal spray Twice daily   Saline nasal gel At bedtime   Incentive spirometry daily  Prednisone 20mg  daily for 5 days .  Continue on oxygen 5l/m rest and 6l/m activity . O2 sats goal >88-90%.  PT at home.  Small lightweight wheelchair  Follow-up with Dr. or Amaya Blakeman NP in 2-3 months and As needed  Please contact office for sooner follow up if symptoms do not improve or worsen or seek emergency care

## 2020-09-26 NOTE — Addendum Note (Signed)
Addended by: Delrae Rend on: 09/26/2020 02:01 PM   Modules accepted: Orders

## 2020-09-26 NOTE — Assessment & Plan Note (Signed)
Severe COPD that is oxygen dependent-patient has high symptom burden.  She multiple comorbidities including cardiomyopathy.  No evidence of volume overload on exam.  Patient has associated hypoxic and hypercarbic respiratory failure.  She declines BiPAP for noninvasive support going forward.  We will continue on her aggressive maintenance regimen with triple therapy.  Continue on incentive spirometer.  Add in physical therapy to help with severe deconditioning.   Short course of low-dose empiric steroids to see if this helps. Goals of care we discussion regarding palliative care and hospice-with patient education to patient and daughter.  Plan  Patient Instructions  Continue on Budesonide Neb Twice daily  .  Continue on Brovana Twice daily  .  Continue on Albuterol and Ipratropium Neb Three times a day   Activity as tolerated Saline nasal spray Twice daily   Saline nasal gel At bedtime   Incentive spirometry daily  Prednisone 20mg  daily for 5 days .  Continue on oxygen 5l/m rest and 6l/m activity . O2 sats goal >88-90%.  PT at home.  Small lightweight wheelchair  Follow-up with Dr. or Bedie Dominey NP in 2-3 months and As needed  Please contact office for sooner follow up if symptoms do not improve or worsen or seek emergency care

## 2020-09-28 DIAGNOSIS — J449 Chronic obstructive pulmonary disease, unspecified: Secondary | ICD-10-CM | POA: Diagnosis not present

## 2020-10-02 DIAGNOSIS — R2689 Other abnormalities of gait and mobility: Secondary | ICD-10-CM | POA: Diagnosis not present

## 2020-10-02 DIAGNOSIS — M6281 Muscle weakness (generalized): Secondary | ICD-10-CM | POA: Diagnosis not present

## 2020-10-05 DIAGNOSIS — R2689 Other abnormalities of gait and mobility: Secondary | ICD-10-CM | POA: Diagnosis not present

## 2020-10-05 DIAGNOSIS — M6281 Muscle weakness (generalized): Secondary | ICD-10-CM | POA: Diagnosis not present

## 2020-10-06 DIAGNOSIS — J9611 Chronic respiratory failure with hypoxia: Secondary | ICD-10-CM | POA: Diagnosis not present

## 2020-10-06 DIAGNOSIS — R2689 Other abnormalities of gait and mobility: Secondary | ICD-10-CM | POA: Diagnosis not present

## 2020-10-06 DIAGNOSIS — J449 Chronic obstructive pulmonary disease, unspecified: Secondary | ICD-10-CM | POA: Diagnosis not present

## 2020-10-06 DIAGNOSIS — M6281 Muscle weakness (generalized): Secondary | ICD-10-CM | POA: Diagnosis not present

## 2020-10-06 DIAGNOSIS — J432 Centrilobular emphysema: Secondary | ICD-10-CM | POA: Diagnosis not present

## 2020-10-09 DIAGNOSIS — M6281 Muscle weakness (generalized): Secondary | ICD-10-CM | POA: Diagnosis not present

## 2020-10-09 DIAGNOSIS — R2689 Other abnormalities of gait and mobility: Secondary | ICD-10-CM | POA: Diagnosis not present

## 2020-10-10 DIAGNOSIS — J310 Chronic rhinitis: Secondary | ICD-10-CM | POA: Diagnosis not present

## 2020-10-10 DIAGNOSIS — K219 Gastro-esophageal reflux disease without esophagitis: Secondary | ICD-10-CM | POA: Diagnosis not present

## 2020-10-10 DIAGNOSIS — Z789 Other specified health status: Secondary | ICD-10-CM | POA: Diagnosis not present

## 2020-10-11 DIAGNOSIS — M6281 Muscle weakness (generalized): Secondary | ICD-10-CM | POA: Diagnosis not present

## 2020-10-11 DIAGNOSIS — J45998 Other asthma: Secondary | ICD-10-CM | POA: Diagnosis not present

## 2020-10-11 DIAGNOSIS — R2689 Other abnormalities of gait and mobility: Secondary | ICD-10-CM | POA: Diagnosis not present

## 2020-10-11 DIAGNOSIS — J449 Chronic obstructive pulmonary disease, unspecified: Secondary | ICD-10-CM | POA: Diagnosis not present

## 2020-10-13 ENCOUNTER — Telehealth: Payer: Self-pay | Admitting: Adult Health

## 2020-10-13 DIAGNOSIS — M6281 Muscle weakness (generalized): Secondary | ICD-10-CM | POA: Diagnosis not present

## 2020-10-13 DIAGNOSIS — R2689 Other abnormalities of gait and mobility: Secondary | ICD-10-CM | POA: Diagnosis not present

## 2020-10-13 DIAGNOSIS — R5381 Other malaise: Secondary | ICD-10-CM

## 2020-10-13 NOTE — Telephone Encounter (Signed)
Checked TP's papers and also checked with TP to see if a fax has been received from [employer] for OT and TP said that she has not seen anything nor was there any paper in her file.  Tried to call [employer] but line rings busy. Tried to call back but still receive busy signal. Will try to call back later.

## 2020-10-15 DIAGNOSIS — J9611 Chronic respiratory failure with hypoxia: Secondary | ICD-10-CM | POA: Diagnosis not present

## 2020-10-15 DIAGNOSIS — J432 Centrilobular emphysema: Secondary | ICD-10-CM | POA: Diagnosis not present

## 2020-10-15 DIAGNOSIS — J449 Chronic obstructive pulmonary disease, unspecified: Secondary | ICD-10-CM | POA: Diagnosis not present

## 2020-10-16 DIAGNOSIS — M6281 Muscle weakness (generalized): Secondary | ICD-10-CM | POA: Diagnosis not present

## 2020-10-16 DIAGNOSIS — R2689 Other abnormalities of gait and mobility: Secondary | ICD-10-CM | POA: Diagnosis not present

## 2020-10-16 NOTE — Telephone Encounter (Signed)
Patient sent email   Good morning, can please see that the OT order is signed and faxed returned to [facility] at [facility]. Thank you.   Sending to [employer] as she was the ordering provider.

## 2020-10-16 NOTE — Telephone Encounter (Signed)
That is fine, where is the order to sign ?

## 2020-10-16 NOTE — Telephone Encounter (Signed)
I do not have any orders to sign , already completed from ov  Do they need additional orders?

## 2020-10-17 DIAGNOSIS — J449 Chronic obstructive pulmonary disease, unspecified: Secondary | ICD-10-CM | POA: Diagnosis not present

## 2020-10-17 DIAGNOSIS — M6281 Muscle weakness (generalized): Secondary | ICD-10-CM | POA: Diagnosis not present

## 2020-10-17 NOTE — Telephone Encounter (Signed)
[employer] and spoke with Eunice Blase letting her know that we could not find fax on OT orders for pt and asked to have this refaxed to office in attn Rubye Oaks NP.  Eunice Blase stated that she would tell them to refax form to [place] so we can then give to TP. Provided Debbie with our office fax in case wrong fax number had originally been given.  Will keep encounter open while we await fax.

## 2020-10-17 NOTE — Telephone Encounter (Signed)
Robin from [employer] on form faxed for OT order. Robin phone number is [phone].

## 2020-10-18 DIAGNOSIS — R2689 Other abnormalities of gait and mobility: Secondary | ICD-10-CM | POA: Diagnosis not present

## 2020-10-18 DIAGNOSIS — M6281 Muscle weakness (generalized): Secondary | ICD-10-CM | POA: Diagnosis not present

## 2020-10-18 NOTE — Telephone Encounter (Signed)
I called [facility] yesterday 4/5 letting them know that we were not seeing a fax on OT orders for pt and they were going to have this refaxed to the office.  Just checked to see if a fax had been received yet and did not see a fax. Will keep an eye out for this and if needed, we will call [employer] again.  There is also an open phone encounter about this in triage as well

## 2020-10-19 DIAGNOSIS — R2689 Other abnormalities of gait and mobility: Secondary | ICD-10-CM | POA: Diagnosis not present

## 2020-10-19 DIAGNOSIS — M6281 Muscle weakness (generalized): Secondary | ICD-10-CM | POA: Diagnosis not present

## 2020-10-19 NOTE — Telephone Encounter (Signed)
Heather, please advise if you've seen this fax. Thanks.

## 2020-10-19 NOTE — Telephone Encounter (Signed)
Just checked mailboxes nothing has been faxed.

## 2020-10-20 DIAGNOSIS — M6281 Muscle weakness (generalized): Secondary | ICD-10-CM | POA: Diagnosis not present

## 2020-10-20 NOTE — Telephone Encounter (Signed)
Yes

## 2020-10-20 NOTE — Telephone Encounter (Signed)
I called and spoke with Rebecca [name]  She already refaxed form x 2 and we still do have not have it  She wants to know if we can just put in a referral for OT at [facility]  Please advise if okay to place order, thanks!

## 2020-10-20 NOTE — Telephone Encounter (Signed)
Order placed for OT per TP.  Patient lives at [place] and they use [address]  Nothing further needed.

## 2020-10-23 DIAGNOSIS — M6281 Muscle weakness (generalized): Secondary | ICD-10-CM | POA: Diagnosis not present

## 2020-10-23 NOTE — Telephone Encounter (Signed)
This has been handled. See phone note dated 10/13/20- new referral was placed for OT. Nothing further needed.

## 2020-10-24 DIAGNOSIS — N819 Female genital prolapse, unspecified: Secondary | ICD-10-CM | POA: Diagnosis not present

## 2020-10-24 DIAGNOSIS — N281 Cyst of kidney, acquired: Secondary | ICD-10-CM | POA: Diagnosis not present

## 2020-10-24 DIAGNOSIS — N952 Postmenopausal atrophic vaginitis: Secondary | ICD-10-CM | POA: Diagnosis not present

## 2020-10-24 DIAGNOSIS — N200 Calculus of kidney: Secondary | ICD-10-CM | POA: Diagnosis not present

## 2020-10-24 DIAGNOSIS — R339 Retention of urine, unspecified: Secondary | ICD-10-CM | POA: Diagnosis not present

## 2020-10-25 DIAGNOSIS — M6281 Muscle weakness (generalized): Secondary | ICD-10-CM | POA: Diagnosis not present

## 2020-10-27 DIAGNOSIS — M6281 Muscle weakness (generalized): Secondary | ICD-10-CM | POA: Diagnosis not present

## 2020-10-28 NOTE — Progress Notes (Deleted)
Cardiology Office Note:    Date:  10/28/2020   ID:  Rebecca [name], DOB 02/18/1941, MRN [id number]  PCP:  Farris Has, MD   [facility]  Cardiologist:  No primary care provider on file. *** Advanced Practice Provider:  No care team member to display Electrophysiologist:  None    Referring MD: Farris Has, MD    History of Present Illness:    Rebecca [name] is a 80 y.o. 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  Initially seen on 07/27/20. Patient reported that she has a history of viral cardiomyopathy in 2010 for which she was followed by Cardiology with serial TTEs. She was told by her Cardiologist in [place] that her EF recovered and she did not need to follow up further. During our visit, she was complaining of worsening DOE worse than her chronic SOB associated with her COPD for which she is on continuous O2. We obtained a TTE which revealed LVEF 40-45% with anteroseptal, mid inferoseptal and basal inferoseptal hypokinesis. Follow-up SPECT showed no evidence of ischemia or infarction but abnormal septal motion consistent with LBBB. She now returns to clinic for follow-up.  Today, the patient states that she overall feels okay. Continues to have shortness of breath. Currently taking lasix 20mg  daily, which may be helping some. Blood pressure running 140s at home. Currently 160s. No chest pain, lightheadedness, dizziness. Exercise capacity very limited with minimal activity.    Past Medical History:  Diagnosis Date  . Congestive heart disease (HCC)   . COPD (chronic obstructive pulmonary disease) (HCC)   . Diabetes (HCC)   . Hyperlipemia   . Osteoporosis   . Primary hypertension     No past surgical history on file.  Current Medications: No outpatient medications have been marked as taking for the 10/31/20 encounter  (Appointment) with 11/02/20, MD.     Allergies:   Rosuvastatin calcium, Simvastatin, Amoxicillin, Lisinopril, Sulfacetamide, and Tetracyclines & related   Social History   Socioeconomic History  . Marital status: Widowed    Spouse name: Not on file  . Number of children: Not on file  . Years of education: Not on file  . Highest education level: Not on file  Occupational History  . Not on file  Tobacco Use  . Smoking status: Former Smoker    Packs/day: 1.50    Years: 40.00    Pack years: 60.00    Types: Cigarettes    Quit date: 2003    Years since quitting: 19.3  . Smokeless tobacco: Never Used  Vaping Use  . Vaping Use: Never used  Substance and Sexual Activity  . Alcohol use: Yes    Alcohol/week: 1.0 standard drink    Types: 1 Glasses of wine per week  . Drug use: Never  . Sexual activity: Not on file  Other Topics Concern  . Not on file  Social History Narrative  . Not on file   Social Determinants of Health   Financial Resource Strain: Not on file  Food Insecurity: Not on file  Transportation Needs: Not on file  Physical Activity: Not on file  Stress: Not on file  Social Connections: Not on file     Family History: The patient's ***family history is not on file.  ROS:   Please see the history of present illness.    *** All  other systems reviewed and are negative.  EKGs/Labs/Other Studies Reviewed:    The following studies were reviewed today: TTE Aug 25, 2020: IMPRESSIONS  1. Left ventricular ejection fraction, by estimation, is 40 to 45%. The  left ventricle has mildly decreased function. The left ventricle  demonstrates regional wall motion abnormalities (see scoring  diagram/findings for description). Left ventricular  diastolic parameters are consistent with Grade I diastolic dysfunction  (impaired relaxation).  2. Right ventricular systolic function is normal. The right ventricular  size is normal.  3. The mitral valve is normal in  structure. No evidence of mitral valve  regurgitation.  4. The aortic valve is grossly normal. Aortic valve regurgitation is not  visualized.   FINDINGS  Left Ventricle: Left ventricular ejection fraction, by estimation, is 40  to 45%. The left ventricle has mildly decreased function. The left  ventricle demonstrates regional wall motion abnormalities. The left  ventricular internal cavity size was small.  There is no left ventricular hypertrophy. Left ventricular diastolic  parameters are consistent with Grade I diastolic dysfunction (impaired  relaxation).     LV Wall Scoring:  The anterior septum, mid inferoseptal segment, and basal inferoseptal  segment  are hypokinetic.   Right Ventricle: The right ventricular size is normal. No increase in  right ventricular wall thickness. Right ventricular systolic function is  normal.   Left Atrium: Left atrial size was normal in size.   Right Atrium: Right atrial size was not well visualized.   Pericardium: Trivial pericardial effusion is present.   Mitral Valve: The mitral valve is normal in structure. No evidence of  mitral valve regurgitation.   Tricuspid Valve: The tricuspid valve is normal in structure. Tricuspid  valve regurgitation is not demonstrated.   Aortic Valve: The aortic valve is grossly normal. There is mild aortic  valve annular calcification. Aortic valve regurgitation is not visualized.   Pulmonic Valve: The pulmonic valve was not well visualized. Pulmonic valve  regurgitation is not visualized.   Aorta: The aortic root is normal in size and structure and the ascending  aorta was not well visualized.   IAS/Shunts: The interatrial septum is aneurysmal. The atrial septum is  grossly normal.   Myoview 08/07/20:  The left ventricular ejection fraction is mildly decreased (45-54%).  Nuclear stress EF: 46%.  There was no ST segment deviation noted during stress.  No T wave inversion was noted during  stress.  The study is normal.  This is a low risk study.   1. There are reduced counts in the septal segments on rest imaging that improve on stress imaging with normal wall motion consistent with left bundle branch block artifact. No evidence of ischemia or infarction.  2. Mildly reduced LVEF, 46%. Septal movement consistent with LBBB. 3. This is an low- to intermediate-risk study.   CTA chest 01/12/20: FINDINGS: Cardiovascular: Satisfactory opacification of the pulmonary arteries to the segmental level. No evidence of pulmonary embolism. Normal heart size. No pericardial effusion. Coronary artery calcifications are noted. Atherosclerosis of thoracic aorta is noted without aneurysm formation.  Mediastinum/Nodes: Small sliding-type hiatal hernia is noted. No adenopathy is noted. Thyroid gland is unremarkable.  Lungs/Pleura: No pneumothorax or pleural effusion is noted. 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 Mild bilateral posterior basilar subsegmental atelectasis is noted.  Upper Abdomen: Bilateral nephrolithiasis is noted.  Musculoskeletal: No chest wall abnormality. No acute or significant osseous findings.  Review of the MIP images confirms the above findings.  IMPRESSION: 1. No definite evidence of pulmonary embolus.  2. Coronary artery calcifications are noted suggesting coronary artery disease. 3. Small sliding-type hiatal hernia. 4. Bilateral nephrolithiasis.  EKG:  EKG is *** ordered today.  The ekg ordered today demonstrates ***  Recent Labs: 01/13/2020: Magnesium 1.7; TSH 0.661 02/21/2020: ALT 14; B Natriuretic Peptide 116.4; Hemoglobin 11.0; Platelets 233 08/03/2020: BUN 14; Creatinine, Ser 0.57; Potassium 4.5; Sodium 139  Recent Lipid Panel No results found for: CHOL, TRIG, HDL, CHOLHDL, VLDL, LDLCALC, LDLDIRECT   Risk Assessment/Calculations:   {Does this patient have ATRIAL FIBRILLATION?[phone]}   Physical Exam:    VS:  There  were no vitals taken for this visit.    Wt Readings from Last 3 Encounters:  09/26/20 154 lb 3.2 oz (69.9 kg)  08/07/20 152 lb (68.9 kg)  08/03/20 152 lb 6.4 oz (69.1 kg)     GEN: *** Well nourished, well developed in no acute distress HEENT: Normal NECK: No JVD; No carotid bruits LYMPHATICS: No lymphadenopathy CARDIAC: ***RRR, no murmurs, rubs, gallops RESPIRATORY:  Clear to auscultation without rales, wheezing or rhonchi  ABDOMEN: Soft, non-tender, non-distended MUSCULOSKELETAL:  No edema; No deformity  SKIN: Warm and dry NEUROLOGIC:  Alert and oriented x 3 PSYCHIATRIC:  Normal affect   ASSESSMENT:    No diagnosis found. 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 Cath in 2010 performed that was reportedly normal. Was told that her pumping function recovered and she did not need to follow with Cardiology in [place]. During last visit on 07/2020, she complained of worsening SOB, DOE, and LE edema. TTE showed LVEF 40-45% with septal and basal inferior wall hypokinesis. Myoview without ischemia or infarction.  -??? Cath given WMA if symptoms persistent -Continue losartan 50mg  daily -Continue coreg 25mg  BID -Continue lasix 20mg  daily -Start spironolactone 12.5mg  daily??  #Chronic hypoxic respiratory failure #COPD: Follows closely with pulm -Follow-up with pulm as scheduled -Continue supplemental O2  #HTN -Continue coreg 25mg  BID -Continue losartan 50mg  daily  -?? -Monitor blood pressures with goal <120s/80s  #LBBB: States this is chronic. Has history of cath in 2010 at OSH which was reportedly normal. Myoview with no ischemia or infarction. TTE with mildly reduced LVEF 40-45% with septal and inferior WMA -??Cath  #HLD Managed by PCP. -Continue pravstatin  10mg ; will likely need to increase pending above work-up  #DMII -Continue metformin -? Add farxiga   {Are you ordering a CV Procedure (e.g. stress test, cath, DCCV, TEE, etc)?   Press F2        :    Medication Adjustments/Labs and Tests Ordered: Current medicines are reviewed at length with the patient today.  Concerns regarding medicines are outlined above.  No orders of the defined types were placed in this encounter.  No orders of the defined types were placed in this encounter.   There are no Patient Instructions on file for this visit.   Signed, , MD  10/28/2020 9:44 AM    [facility]

## 2020-10-30 DIAGNOSIS — R2689 Other abnormalities of gait and mobility: Secondary | ICD-10-CM | POA: Diagnosis not present

## 2020-10-30 DIAGNOSIS — M6281 Muscle weakness (generalized): Secondary | ICD-10-CM | POA: Diagnosis not present

## 2020-10-31 ENCOUNTER — Other Ambulatory Visit: Payer: Self-pay

## 2020-10-31 ENCOUNTER — Encounter: Payer: Self-pay | Admitting: Cardiology

## 2020-10-31 ENCOUNTER — Ambulatory Visit: Payer: Medicare HMO | Admitting: Cardiology

## 2020-10-31 VITALS — BP 160/62 | HR 54 | Ht 62.0 in | Wt 153.4 lb

## 2020-10-31 DIAGNOSIS — I447 Left bundle-branch block, unspecified: Secondary | ICD-10-CM

## 2020-10-31 DIAGNOSIS — I10 Essential (primary) hypertension: Secondary | ICD-10-CM | POA: Diagnosis not present

## 2020-10-31 DIAGNOSIS — J449 Chronic obstructive pulmonary disease, unspecified: Secondary | ICD-10-CM | POA: Diagnosis not present

## 2020-10-31 DIAGNOSIS — E118 Type 2 diabetes mellitus with unspecified complications: Secondary | ICD-10-CM

## 2020-10-31 DIAGNOSIS — Z79899 Other long term (current) drug therapy: Secondary | ICD-10-CM | POA: Diagnosis not present

## 2020-10-31 DIAGNOSIS — E785 Hyperlipidemia, unspecified: Secondary | ICD-10-CM

## 2020-10-31 DIAGNOSIS — I5022 Chronic systolic (congestive) heart failure: Secondary | ICD-10-CM

## 2020-10-31 DIAGNOSIS — R0602 Shortness of breath: Secondary | ICD-10-CM

## 2020-10-31 MED ORDER — LOSARTAN POTASSIUM 100 MG PO TABS: 100.0000 mg | ORAL_TABLET | Freq: Every day | ORAL | 1 refills | Status: DC

## 2020-10-31 MED ORDER — SPIRONOLACTONE 25 MG PO TABS: 12.5000 mg | ORAL_TABLET | Freq: Every day | ORAL | 1 refills | Status: DC

## 2020-10-31 MED ORDER — PRAVASTATIN SODIUM 20 MG PO TABS: 20.0000 mg | ORAL_TABLET | Freq: Every evening | ORAL | 1 refills | Status: DC

## 2020-10-31 NOTE — Progress Notes (Signed)
Cardiology Office Note:    Date:  10/31/2020   ID:  Rebecca [name], DOB April 03, 1941, MRN [id number]  PCP:  Farris HasMorrow, Aaron, MD   [facility]  Cardiologist:  No primary care provider on file.  Advanced Practice Provider:  No care team member to display Electrophysiologist:  None    Referring MD: Farris HasMorrow, Aaron, MD    History of Present Illness:    Rebecca [name] is a 80 y.o. 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  Initially seen on 07/27/20. Patient reported that she has a history of viral cardiomyopathy in 2010 for which she was followed by Cardiology with serial TTEs. She was told by her Cardiologist in [place] that her EF recovered and she did not need to follow up further. During our visit, she was complaining of worsening DOE worse than her chronic SOB associated with her COPD for which she is on continuous O2. We obtained a TTE which revealed LVEF 40-45% with anteroseptal, mid inferoseptal and basal inferoseptal hypokinesis. Follow-up SPECT showed no evidence of ischemia or infarction but abnormal septal motion consistent with LBBB. She now returns to clinic for follow-up.  Today, she is accompanied by a family member and is overall doing okay. She reports her shortness of breath is about the same as last visit. Has been taking lasix 20mg  daily which has helped some. She is unable to walk to the restroom or bedroom without needing to stop to catch her breath. Blood pressure elevated at 140s at home; currently 160s here. She denies any chest pain or pressure, lightheadedness, syncope, lower extremity edema, or palpitations. Monitors her O2 sat and no desaturations with ambulation despite dyspnea.   She also reports that she is feeling like "walking on water pillows".  This feeling begins at her knees and radiates distally when it  occurs. Has underlying DMII and tobacco use with concern for peripheral neuropathy.  We discussed her echo and symptoms at length today and the option of continued medical management versus pursuing cath. She would like to continue with medical management for now.     Past Medical History:  Diagnosis Date  . Congestive heart disease (HCC)   . COPD (chronic obstructive pulmonary disease) (HCC)   . Diabetes (HCC)   . Hyperlipemia   . Osteoporosis   . Primary hypertension     No past surgical history on file.  Current Medications: Current Meds  Medication Sig  . acetaminophen (TYLENOL) 500 MG tablet Take 1,000 mg by mouth 2 (two) times daily as needed for mild pain or headache.  . albuterol (PROVENTIL) (2.5 MG/3ML) 0.083% nebulizer solution TAKE 3 MLS (2.5 MG TOTAL) BY NEBULIZATION IN THE MORNING, AT NOON, AND AT BEDTIME. USE AS DIRECTED  . albuterol (VENTOLIN HFA) 108 (90 Base) MCG/ACT inhaler Inhale 1-2 puffs into the lungs every 6 (six) hours as needed for wheezing or shortness of breath.  . BROVANA 15 MCG/2ML NEBU USE 1 VIAL  IN  NEBULIZER TWICE  DAILY - morning and evening  . budesonide (PULMICORT) 0.5 MG/2ML nebulizer solution USE 1 VIAL  IN  NEBULIZER TWICE  DAILY - Rinse mouth after treatment  . Calcium Carbonate-Vitamin D 600-200 MG-UNIT TABS Take 1 tablet by mouth daily.   . carvedilol (COREG) 25 MG tablet Take 25 mg by mouth 2 (two) times daily.   Marland Kitchen. estradiol (ESTRACE)  0.1 MG/GM vaginal cream Place 1 Applicatorful vaginally every other day.   . furosemide (LASIX) 20 MG tablet Take 20 mg by mouth daily.  Marland Kitchen ipratropium (ATROVENT) 0.02 % nebulizer solution USE 1 VIAL IN NEBULIZER 3 TIMES DAILY  . losartan (COZAAR) 100 MG tablet Take 1 tablet (100 mg total) by mouth daily.  . metFORMIN (GLUCOPHAGE) 500 MG tablet Take 500 mg by mouth daily with supper.   . Potassium Chloride ER 20 MEQ TBCR Take 1 tablet by mouth daily at 12 noon. When you take furosemide  . pravastatin (PRAVACHOL)  20 MG tablet Take 1 tablet (20 mg total) by mouth every evening.  Marland Kitchen spironolactone (ALDACTONE) 25 MG tablet Take 0.5 tablets (12.5 mg total) by mouth daily.  . [DISCONTINUED] losartan (COZAAR) 50 MG tablet Take 1 tablet (50 mg total) by mouth daily.  . [DISCONTINUED] pravastatin (PRAVACHOL) 10 MG tablet Take 10 mg by mouth every other day.      Allergies:   Rosuvastatin calcium, Simvastatin, Amoxicillin, Lisinopril, Sulfacetamide, and Tetracyclines & related   Social History   Socioeconomic History  . Marital status: Widowed    Spouse name: Not on file  . Number of children: Not on file  . Years of education: Not on file  . Highest education level: Not on file  Occupational History  . Not on file  Tobacco Use  . Smoking status: Former Smoker    Packs/day: 1.50    Years: 40.00    Pack years: 60.00    Types: Cigarettes    Quit date: 2003    Years since quitting: 19.3  . Smokeless tobacco: Never Used  Vaping Use  . Vaping Use: Never used  Substance and Sexual Activity  . Alcohol use: Yes    Alcohol/week: 1.0 standard drink    Types: 1 Glasses of wine per week  . Drug use: Never  . Sexual activity: Not on file  Other Topics Concern  . Not on file  Social History Narrative  . Not on file   Social Determinants of Health   Financial Resource Strain: Not on file  Food Insecurity: Not on file  Transportation Needs: Not on file  Physical Activity: Not on file  Stress: Not on file  Social Connections: Not on file     Family History: The patient's family history is not on file.  ROS:   Please see the history of present illness. (+) Shortness of breath    All other systems reviewed and are negative.  EKGs/Labs/Other Studies Reviewed:    The following studies were reviewed today: TTE 09/02/2020: IMPRESSIONS  1. Left ventricular ejection fraction, by estimation, is 40 to 45%. The  left ventricle has mildly decreased function. The left ventricle  demonstrates regional  wall motion abnormalities (see scoring  diagram/findings for description). Left ventricular  diastolic parameters are consistent with Grade I diastolic dysfunction  (impaired relaxation).  2. Right ventricular systolic function is normal. The right ventricular  size is normal.  3. The mitral valve is normal in structure. No evidence of mitral valve  regurgitation.  4. The aortic valve is grossly normal. Aortic valve regurgitation is not  visualized.   FINDINGS  Left Ventricle: Left ventricular ejection fraction, by estimation, is 40  to 45%. The left ventricle has mildly decreased function. The left  ventricle demonstrates regional wall motion abnormalities. The left  ventricular internal cavity size was small.  There is no left ventricular hypertrophy. Left ventricular diastolic  parameters are consistent with Grade  I diastolic dysfunction (impaired  relaxation).     LV Wall Scoring:  The anterior septum, mid inferoseptal segment, and basal inferoseptal  segment  are hypokinetic.   Right Ventricle: The right ventricular size is normal. No increase in  right ventricular wall thickness. Right ventricular systolic function is  normal.   Left Atrium: Left atrial size was normal in size.   Right Atrium: Right atrial size was not well visualized.   Pericardium: Trivial pericardial effusion is present.   Mitral Valve: The mitral valve is normal in structure. No evidence of  mitral valve regurgitation.   Tricuspid Valve: The tricuspid valve is normal in structure. Tricuspid  valve regurgitation is not demonstrated.   Aortic Valve: The aortic valve is grossly normal. There is mild aortic  valve annular calcification. Aortic valve regurgitation is not visualized.   Pulmonic Valve: The pulmonic valve was not well visualized. Pulmonic valve  regurgitation is not visualized.   Aorta: The aortic root is normal in size and structure and the ascending  aorta was not well  visualized.   IAS/Shunts: The interatrial septum is aneurysmal. The atrial septum is  grossly normal.   Myoview 08/07/20:  The left ventricular ejection fraction is mildly decreased (45-54%).  Nuclear stress EF: 46%.  There was no ST segment deviation noted during stress.  No T wave inversion was noted during stress.  The study is normal.  This is a low risk study.   1. There are reduced counts in the septal segments on rest imaging that improve on stress imaging with normal wall motion consistent with left bundle branch block artifact. No evidence of ischemia or infarction.  2. Mildly reduced LVEF, 46%. Septal movement consistent with LBBB. 3. This is an low- to intermediate-risk study.   CTA chest 01/12/20: FINDINGS: Cardiovascular: Satisfactory opacification of the pulmonary arteries to the segmental level. No evidence of pulmonary embolism. Normal heart size. No pericardial effusion. Coronary artery calcifications are noted. Atherosclerosis of thoracic aorta is noted without aneurysm formation.  Mediastinum/Nodes: Small sliding-type hiatal hernia is noted. No adenopathy is noted. Thyroid gland is unremarkable.  Lungs/Pleura: No pneumothorax or pleural effusion is noted. 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 Mild bilateral posterior basilar subsegmental atelectasis is noted.  Upper Abdomen: Bilateral nephrolithiasis is noted.  Musculoskeletal: No chest wall abnormality. No acute or significant osseous findings.  Review of the MIP images confirms the above findings.  IMPRESSION: 1. No definite evidence of pulmonary embolus. 2. Coronary artery calcifications are noted suggesting coronary artery disease. 3. Small sliding-type hiatal hernia. 4. Bilateral nephrolithiasis.  EKG:   10/31/2020:  EKG was not ordered today.  Recent Labs: 01/13/2020: Magnesium 1.7; TSH 0.661 02/21/2020: ALT 14; B Natriuretic Peptide 116.4; Hemoglobin 11.0; Platelets  233 08/03/2020: BUN 14; Creatinine, Ser 0.57; Potassium 4.5; Sodium 139  Recent Lipid Panel No results found for: CHOL, TRIG, HDL, CHOLHDL, VLDL, LDLCALC, LDLDIRECT   Risk Assessment/Calculations:       Physical Exam:    VS:  BP (!) 160/62   Pulse (!) 54   Ht 5\' 2"  (1.575 m)   Wt 153 lb 6.4 oz (69.6 kg)   SpO2 98%   BMI 28.06 kg/m     Wt Readings from Last 3 Encounters:  10/31/20 153 lb 6.4 oz (69.6 kg)  09/26/20 154 lb 3.2 oz (69.9 kg)  08/07/20 152 lb (68.9 kg)     GEN: Comfortable, elderly female, O2 in place HEENT: Normal NECK: No JVD; No carotid bruits CARDIAC: RRR,  no murmurs, rubs, gallops RESPIRATORY:  Diminished breath sounds throughout, no crackles or wheezes  ABDOMEN: Soft, non-tender, non-distended MUSCULOSKELETAL:  No edema; No deformity  SKIN: Warm and dry NEUROLOGIC:  Alert and oriented x 3 PSYCHIATRIC:  Normal affect   ASSESSMENT:    1. Chronic systolic heart failure (HCC)   2. Hypertension, unspecified type   3. Hyperlipidemia, unspecified hyperlipidemia type   4. Medication management   5. SOB (shortness of breath)   6. Chronic obstructive pulmonary disease, unspecified COPD type (HCC)   7. Left bundle branch block   8. Type 2 diabetes mellitus with complication, without long-term current use of insulin (HCC)    PLAN:    In order of problems listed above:  #Chronic Systolic Heart Failure with LVEF 40-45% #History of viral cardiomyopathy #Wall Motion Abnormalities on TTE Patient with reported history of viral cardiomyopathy diagnosed in [place]. Cath in 2010 performed that was reportedly normal. Was told that her pumping function recovered and she did not need to follow with Cardiology in [place]. During last visit on 07/2020, she complained of worsening SOB, DOE, and LE edema. TTE showed LVEF 40-45% with septal and basal inferior wall hypokinesis. Myoview without ischemia or infarction.  -Will continue with medical management at this  time and pursue cath if symptoms persist -Increase losartan to 100mg  daily -Continue coreg 25mg  BID -Continue lasix 20mg  daily -Start spironolactone 12.5mg  daily -Low Na diet -BMET next week  #Chronic hypoxic respiratory failure #COPD: Follows closely with pulm. -Follow-up with pulm as scheduled -Continue supplemental O2  #HTN -Continue coreg 25mg  BID -Increase losartan to 100mg  daily  -Add spironolactone 12.5mg  daily as above -Monitor blood pressures with goal <120s/80s  #LBBB: States this is chronic. Has history of cath in 2010 at OSH which was reportedly normal. Myoview with no ischemia or infarction. TTE with mildly reduced LVEF 40-45% with septal and inferior WMA -Medical management as above -If above management fails, will proceed with coronary angiography  #HLD Managed by PCP. Intolerant to crestor and simva -Increase prava to 20mg  daily and monitor -Consider zetia 10mg  at next visit  #DMII -Continue metformin -Consider adding farxiga at next visit   Medication Adjustments/Labs and Tests Ordered: Current medicines are reviewed at length with the patient today.  Concerns regarding medicines are outlined above.  Orders Placed This Encounter  Procedures  . Basic metabolic panel   Meds ordered this encounter  Medications  . losartan (COZAAR) 100 MG tablet    Sig: Take 1 tablet (100 mg total) by mouth daily.    Dispense:  90 tablet    Refill:  1    Dose increase  . spironolactone (ALDACTONE) 25 MG tablet    Sig: Take 0.5 tablets (12.5 mg total) by mouth daily.    Dispense:  45 tablet    Refill:  1  . pravastatin (PRAVACHOL) 20 MG tablet    Sig: Take 1 tablet (20 mg total) by mouth every evening.    Dispense:  90 tablet    Refill:  1    Dose increase    Patient Instructions  Medication Instructions:   INCREASE YOUR LOSARTAN TO 100 MG BY MOUTH DAILY  INCREASE YOUR PRAVASTATIN TO 20 MG BY MOUTH EVERY EVENING  START TAKING SPIRONOLACTONE 12.5 MG BY  MOUTH DAILY  *If you need a refill on your cardiac medications before your next appointment, please call your pharmacy*   Lab Work:  ONE WEEK HERE IN THE OFFICE--WE WILL CHECK BMET  If you have  labs (blood work) drawn today and your tests are completely normal, you will receive your results only by: Marland Kitchen MyChart Message (if you have MyChart) OR . A paper copy in the mail If you have any lab test that is abnormal or we need to change your treatment, we will call you to review the results.   Follow-Up:  2 MONTHS IN THE OFFICE WITH DR. Shari Prows    Follow-up in 2 months.  I,Mathew Stumpf,acting as a Neurosurgeon for Meriam Sprague, MD.,have documented all relevant documentation on the behalf of Meriam Sprague, MD,as directed by  Meriam Sprague, MD while in the presence of Meriam Sprague, MD.  I, Meriam Sprague, MD, have reviewed all documentation for this visit. The documentation on 10/31/20 for the exam, diagnosis, procedures, and orders are all accurate and complete.  Signed, Meriam Sprague, MD  10/31/2020 12:29 PM    [facility]

## 2020-10-31 NOTE — Patient Instructions (Signed)
Medication Instructions:   INCREASE YOUR LOSARTAN TO 100 MG BY MOUTH DAILY  INCREASE YOUR PRAVASTATIN TO 20 MG BY MOUTH EVERY EVENING  START TAKING SPIRONOLACTONE 12.5 MG BY MOUTH DAILY  *If you need a refill on your cardiac medications before your next appointment, please call your pharmacy*   Lab Work:  ONE WEEK HERE IN THE OFFICE--WE WILL CHECK BMET  If you have labs (blood work) drawn today and your tests are completely normal, you will receive your results only by: Marland Kitchen MyChart Message (if you have MyChart) OR . A paper copy in the mail If you have any lab test that is abnormal or we need to change your treatment, we will call you to review the results.   Follow-Up:  2 MONTHS IN THE OFFICE WITH DR. Shari Prows

## 2020-11-01 DIAGNOSIS — M6281 Muscle weakness (generalized): Secondary | ICD-10-CM | POA: Diagnosis not present

## 2020-11-01 DIAGNOSIS — R2689 Other abnormalities of gait and mobility: Secondary | ICD-10-CM | POA: Diagnosis not present

## 2020-11-02 ENCOUNTER — Ambulatory Visit: Payer: Medicare HMO | Admitting: Adult Health

## 2020-11-02 DIAGNOSIS — M6281 Muscle weakness (generalized): Secondary | ICD-10-CM | POA: Diagnosis not present

## 2020-11-02 DIAGNOSIS — R2689 Other abnormalities of gait and mobility: Secondary | ICD-10-CM | POA: Diagnosis not present

## 2020-11-03 DIAGNOSIS — M6281 Muscle weakness (generalized): Secondary | ICD-10-CM | POA: Diagnosis not present

## 2020-11-06 DIAGNOSIS — J9611 Chronic respiratory failure with hypoxia: Secondary | ICD-10-CM | POA: Diagnosis not present

## 2020-11-06 DIAGNOSIS — R2689 Other abnormalities of gait and mobility: Secondary | ICD-10-CM | POA: Diagnosis not present

## 2020-11-06 DIAGNOSIS — M6281 Muscle weakness (generalized): Secondary | ICD-10-CM | POA: Diagnosis not present

## 2020-11-06 DIAGNOSIS — J432 Centrilobular emphysema: Secondary | ICD-10-CM | POA: Diagnosis not present

## 2020-11-06 DIAGNOSIS — J449 Chronic obstructive pulmonary disease, unspecified: Secondary | ICD-10-CM | POA: Diagnosis not present

## 2020-11-07 ENCOUNTER — Telehealth: Payer: Self-pay | Admitting: Cardiology

## 2020-11-07 DIAGNOSIS — M6281 Muscle weakness (generalized): Secondary | ICD-10-CM | POA: Diagnosis not present

## 2020-11-07 DIAGNOSIS — R2689 Other abnormalities of gait and mobility: Secondary | ICD-10-CM | POA: Diagnosis not present

## 2020-11-07 NOTE — Telephone Encounter (Signed)
Pt c/o BP issue: STAT if pt c/o blurred vision, one-sided weakness or slurred speech  1. What are your last 5 BP readings? Saturday night 109/62 Sunday night 108/53 Monday night 102/49  2. Are you having any other symptoms (ex. Dizziness, headache, blurred vision, passed out)? Blurred vision  3. What is your BP issue? Having difficulties breathing. Patient its states it been happening since Dr increase her dosage of medication

## 2020-11-07 NOTE — Telephone Encounter (Signed)
Rebecca Sprague, MD  [name], LPN Caller: Unspecified (Today, 11:09 AM) These blood pressures look great! Love it! Thank you!!    Pt made aware that per Dr. Shari Prows, her BPs look great, she should continue her current regimen and continue to monitor. Pt verbalized understanding and agrees with this plan.

## 2020-11-07 NOTE — Telephone Encounter (Signed)
Pt was calling in to give an update on how her BPs have been running, since she saw Dr. Shari Prows in the office on 4/19, and her losartan was increased to 100 mg po daily and spironolactone was added to her regimen at 12.5 mg po daily.  She said Dr. Shari Prows instructed her to keep [place] posted on these readings, with med changes made on 4/19.  Pt states the last 3 BP readings she recorded:  Sat- 109/62 Sun 108/53  Monday- 102/49  Pt voiced no other cardiac complaints over the phone. She complains of NO low energy,  feeling fatigued, dizziness, blurred vision, pre-syncopal or syncopal episodes with new med regimen and low normal BP readings.  She will come to the office on 4/28 to have lab done to check a BMET, for recent med changes made by [place] on 4/19.  Pt just wanted to clarify with [place] that her BP recordings are acceptable. Pt states she is hydrating and is doing better with increasing this.  She has no sob or DOE.  Her swelling is improved.   Informed the pt that I will route this message to Dr. Shari Prows to further review and advise as needed, and I will follow-up with the pt accordingly thereafter.  Advised her to continue her regimen and come to the lab as scheduled for 4/28.  Pt verbalized understanding and agrees with this plan.

## 2020-11-08 DIAGNOSIS — M6281 Muscle weakness (generalized): Secondary | ICD-10-CM | POA: Diagnosis not present

## 2020-11-09 ENCOUNTER — Other Ambulatory Visit: Payer: Self-pay

## 2020-11-09 ENCOUNTER — Other Ambulatory Visit: Payer: Medicare HMO

## 2020-11-09 DIAGNOSIS — I10 Essential (primary) hypertension: Secondary | ICD-10-CM | POA: Diagnosis not present

## 2020-11-09 DIAGNOSIS — E785 Hyperlipidemia, unspecified: Secondary | ICD-10-CM

## 2020-11-09 DIAGNOSIS — R2689 Other abnormalities of gait and mobility: Secondary | ICD-10-CM | POA: Diagnosis not present

## 2020-11-09 DIAGNOSIS — M6281 Muscle weakness (generalized): Secondary | ICD-10-CM | POA: Diagnosis not present

## 2020-11-09 DIAGNOSIS — Z79899 Other long term (current) drug therapy: Secondary | ICD-10-CM

## 2020-11-09 LAB — BASIC METABOLIC PANEL
BUN/Creatinine Ratio: 30 — ABNORMAL HIGH (ref 12–28)
BUN: 22 mg/dL (ref 8–27)
CO2: 37 mmol/L — ABNORMAL HIGH (ref 20–29)
Calcium: 9.8 mg/dL (ref 8.7–10.3)
Chloride: 94 mmol/L — ABNORMAL LOW (ref 96–106)
Creatinine, Ser: 0.73 mg/dL (ref 0.57–1.00)
Glucose: 96 mg/dL (ref 65–99)
Potassium: 5 mmol/L (ref 3.5–5.2)
Sodium: 142 mmol/L (ref 134–144)
eGFR: 83 mL/min/{1.73_m2} (ref >59)

## 2020-11-10 DIAGNOSIS — M6281 Muscle weakness (generalized): Secondary | ICD-10-CM | POA: Diagnosis not present

## 2020-11-14 DIAGNOSIS — J9611 Chronic respiratory failure with hypoxia: Secondary | ICD-10-CM | POA: Diagnosis not present

## 2020-11-14 DIAGNOSIS — J432 Centrilobular emphysema: Secondary | ICD-10-CM | POA: Diagnosis not present

## 2020-11-14 DIAGNOSIS — M6281 Muscle weakness (generalized): Secondary | ICD-10-CM | POA: Diagnosis not present

## 2020-11-14 DIAGNOSIS — R2689 Other abnormalities of gait and mobility: Secondary | ICD-10-CM | POA: Diagnosis not present

## 2020-11-14 DIAGNOSIS — J449 Chronic obstructive pulmonary disease, unspecified: Secondary | ICD-10-CM | POA: Diagnosis not present

## 2020-11-17 DIAGNOSIS — M6281 Muscle weakness (generalized): Secondary | ICD-10-CM | POA: Diagnosis not present

## 2020-11-17 DIAGNOSIS — R2689 Other abnormalities of gait and mobility: Secondary | ICD-10-CM | POA: Diagnosis not present

## 2020-11-21 DIAGNOSIS — E119 Type 2 diabetes mellitus without complications: Secondary | ICD-10-CM | POA: Diagnosis not present

## 2020-11-21 DIAGNOSIS — H401131 Primary open-angle glaucoma, bilateral, mild stage: Secondary | ICD-10-CM | POA: Diagnosis not present

## 2020-11-21 DIAGNOSIS — H04123 Dry eye syndrome of bilateral lacrimal glands: Secondary | ICD-10-CM | POA: Diagnosis not present

## 2020-11-21 DIAGNOSIS — H31093 Other chorioretinal scars, bilateral: Secondary | ICD-10-CM | POA: Diagnosis not present

## 2020-11-21 DIAGNOSIS — H40052 Ocular hypertension, left eye: Secondary | ICD-10-CM | POA: Diagnosis not present

## 2020-11-22 DIAGNOSIS — J449 Chronic obstructive pulmonary disease, unspecified: Secondary | ICD-10-CM | POA: Diagnosis not present

## 2020-11-22 DIAGNOSIS — M6281 Muscle weakness (generalized): Secondary | ICD-10-CM | POA: Diagnosis not present

## 2020-11-22 DIAGNOSIS — R2689 Other abnormalities of gait and mobility: Secondary | ICD-10-CM | POA: Diagnosis not present

## 2020-11-24 DIAGNOSIS — R2689 Other abnormalities of gait and mobility: Secondary | ICD-10-CM | POA: Diagnosis not present

## 2020-11-24 DIAGNOSIS — M6281 Muscle weakness (generalized): Secondary | ICD-10-CM | POA: Diagnosis not present

## 2020-11-27 DIAGNOSIS — M6281 Muscle weakness (generalized): Secondary | ICD-10-CM | POA: Diagnosis not present

## 2020-11-27 DIAGNOSIS — R2689 Other abnormalities of gait and mobility: Secondary | ICD-10-CM | POA: Diagnosis not present

## 2020-11-28 ENCOUNTER — Other Ambulatory Visit: Payer: Self-pay

## 2020-11-28 ENCOUNTER — Encounter: Payer: Self-pay | Admitting: Pulmonary Disease

## 2020-11-28 ENCOUNTER — Ambulatory Visit: Payer: Medicare HMO | Admitting: Pulmonary Disease

## 2020-11-28 DIAGNOSIS — J9611 Chronic respiratory failure with hypoxia: Secondary | ICD-10-CM

## 2020-11-28 DIAGNOSIS — J449 Chronic obstructive pulmonary disease, unspecified: Secondary | ICD-10-CM

## 2020-11-28 MED ORDER — YUPELRI 175 MCG/3ML IN SOLN: 175.0000 ug | Freq: Every day | RESPIRATORY_TRACT | 2 refills | Status: AC

## 2020-11-28 NOTE — Assessment & Plan Note (Signed)
Continue 2 L oxygen at all times. She is active with rehab at her facility  She will try to get a wheelchair for when she goes out to the store

## 2020-11-28 NOTE — Patient Instructions (Signed)
Trial of Yupelri nebs once daily -this will take the place of ipratropium nebs, prescription will be sent to direct Rx specialty pharmacy  You will continue on budesonide and Brovana nebs twice daily. You will continue using albuterol nebs as needed up to thrice daily

## 2020-11-28 NOTE — Progress Notes (Signed)
   Subjective:    Patient ID: Rebecca [name], female    DOB: 12-11-1940, 80 y.o.   MRN: [id number]  HPI  80 yo ex-smoker for follow-up of COPD and chronic respiratory failure on oxygen since 2010  She smoked about a pack per day until she quit in 2003, more than 40 pack years PMH -nonischemic cardiomyopathy, last EF 46% 07/2020 ,diabetes and hypertension Lives in independent living at [facility]  Accompanied by her daughter, who corroborates history. She uses 2 L of oxygen at all times, uses walker , undergoing rehab at her facility.  She is compliant with budesonide and Brovana regimen twice daily.  In spite of this she needs albuterol mixed with Atrovent 3 times daily She is limited by her shortness of breath  Cardiology evaluation 10/31/2020 reviewed , medical management emphasized, losartan and Coreg with diuretics Lasix and Aldactone. She reports an episode of low blood pressure this morning although on review blood pressure and this time is good  Significant tests/ events reviewed  Spirometry7/2019showed severe airway obstruction with ratio 43, FEV1 of 0.57-31% and FVC of 53%.  CT angio 06/18/18 LUL nodule 60mm  CT chest angio 12/2019 >> severe emphysema  Review of Systems neg for any significant sore throat, dysphagia, itching, sneezing, nasal congestion or excess/ purulent secretions, fever, chills, sweats, unintended wt loss, pleuritic or exertional cp, hempoptysis, orthopnea pnd or change in chronic leg swelling. Also denies presyncope, palpitations, heartburn, abdominal pain, nausea, vomiting, diarrhea or change in bowel or urinary habits, dysuria,hematuria, rash, arthralgias, visual complaints, headache, numbness weakness or ataxia.     Objective:   Physical Exam  Gen. Pleasant, well-nourished, in no distress, normal affect,elderly,  ENT - no pallor,icterus, no post nasal drip, on O2 2L  Neck: No JVD, no thyromegaly, no carotid bruits Lungs: no use of  accessory muscles, no dullness to percussion, clear without rales or rhonchi  Cardiovascular: Rhythm regular, heart sounds  normal, no murmurs or gallops, no peripheral edema Abdomen: soft and non-tender, no hepatosplenomegaly, BS normal. Musculoskeletal: No deformities, no cyanosis or clubbing Neuro:  alert, non focal        Assessment & Plan:

## 2020-11-28 NOTE — Assessment & Plan Note (Signed)
Trial of Yupelri nebs once daily -this will take the place of ipratropium nebs, prescription will be sent to direct Rx specialty pharmacy  You will continue on budesonide and Brovana nebs twice daily. You will continue using albuterol nebs as needed up to thrice daily  Hopefully this will decrease her total Eptam and improve quality of life

## 2020-11-29 DIAGNOSIS — R2689 Other abnormalities of gait and mobility: Secondary | ICD-10-CM | POA: Diagnosis not present

## 2020-11-29 DIAGNOSIS — M6281 Muscle weakness (generalized): Secondary | ICD-10-CM | POA: Diagnosis not present

## 2020-11-30 DIAGNOSIS — M6281 Muscle weakness (generalized): Secondary | ICD-10-CM | POA: Diagnosis not present

## 2020-11-30 DIAGNOSIS — R2689 Other abnormalities of gait and mobility: Secondary | ICD-10-CM | POA: Diagnosis not present

## 2020-12-01 ENCOUNTER — Telehealth: Payer: Self-pay | Admitting: Cardiology

## 2020-12-01 MED ORDER — LOSARTAN POTASSIUM 50 MG PO TABS: 50.0000 mg | ORAL_TABLET | Freq: Every day | ORAL | 0 refills | Status: DC

## 2020-12-01 MED ORDER — PRAVASTATIN SODIUM 10 MG PO TABS: 10.0000 mg | ORAL_TABLET | Freq: Every day | ORAL | 0 refills | Status: DC

## 2020-12-01 NOTE — Telephone Encounter (Signed)
Pt was calling to let Dr. Shari Prows know that on this past Tuesday 5/17, she had hypotensive episode, where her BP dropped with systolic as low as 74 and diastolic unknown.  Pt states at that time she felt dizzy, saw dark spots, and felt pre-syncopal. Pt states she pushed po fluids on herself and pressure and symptoms improved throughout the day.  Pt states she recently had her losartan increased at 4/19 OV to 100 mg po daily.   Pt states that on 5/18 she decreased her losartan back down to 50 mg po daily, continued her spironolactone 12.5 mg po daily, and her BP has very much improved.  She states since 5/18 to current date her pressures are now running on Wed 128/61, Thurs 120/62, and today 120/60.  She would like to run this by Dr. Shari Prows, and ask if it is ok to now reduce her losartan back down to 50 mg po daily, and continue her spironolactone 12.5 mg po daily.  Pt also states at last OV on 4/19, we increased her pravastatin to 20 mg po daily, and her shoulders and joints began to ache.  She states she then decided to decrease her pravastatin to 10 mg po daily, and shoulder and joint aches are now gone.   Pt wanted to endorse to Dr. Shari Prows that she reduced pravastatin and losartan and is tolerating both very well since reducing.  She wants to make sure she is ok with that.  Pt states she is very conscientious with taking her BP/HR daily and making sure she is hydrating appropriately.  She adds she is very active but is "playing it safe" with warmer weather.   Advised the pt to continue her current reduced regimen of losartan 50 mg po daily and pravastatin 10 mg po daily, and I will make Dr Shari Prows aware of these changes.  Advised her to continue monitoring her BP/HR and other symptoms, and report as needed. Advised her to continue hydrating appropriately, especially with warmer climate.  Informed the pt that if Dr. Shari Prows has further recommendations, I will follow-up with her accordingly  thereafter. Pt verbalized understanding and agrees with this plan.  Pt was more than gracious for all the assistance provided.

## 2020-12-01 NOTE — Telephone Encounter (Signed)
Pt c/o BP issue: STAT if pt c/o blurred vision, one-sided weakness or slurred speech  1. What are your last 5 BP readings?  11/28/20 systolic 74 doesn't remeber diastolic   2. Are you having any other symptoms (ex. Dizziness, headache, blurred vision, passed out)? Blurry vision, felt strange and weird, affected eye sight with dark spots, took 3 hrs to get back normal   3. What is your BP issue? Hypotension. States she went back to previous dosage for BP and cholesterol medications due to increase in medications causing this. Has not had a hypotension episode since. Please advise.

## 2020-12-01 NOTE — Telephone Encounter (Signed)
Completely agree with your plan. Okay to decrease losartan and prava. We will see her in follow-up.

## 2020-12-01 NOTE — Telephone Encounter (Signed)
Spoke with the pt and informed her that per Dr. Shari Prows, it is okay to decrease her losartan back to 50 mg po daily and pravastatin 10 mg po daily, and we will follow-up with her as planned.  Confirmed the pharmacy of choice with the pt. Advised the pt to continue monitoring her BP/HR and report as needed.  Pt verbalized understanding and agrees with this plan.

## 2020-12-04 DIAGNOSIS — R2689 Other abnormalities of gait and mobility: Secondary | ICD-10-CM | POA: Diagnosis not present

## 2020-12-04 DIAGNOSIS — J449 Chronic obstructive pulmonary disease, unspecified: Secondary | ICD-10-CM | POA: Diagnosis not present

## 2020-12-04 DIAGNOSIS — M6281 Muscle weakness (generalized): Secondary | ICD-10-CM | POA: Diagnosis not present

## 2020-12-06 DIAGNOSIS — J449 Chronic obstructive pulmonary disease, unspecified: Secondary | ICD-10-CM | POA: Diagnosis not present

## 2020-12-06 DIAGNOSIS — J432 Centrilobular emphysema: Secondary | ICD-10-CM | POA: Diagnosis not present

## 2020-12-06 DIAGNOSIS — J9611 Chronic respiratory failure with hypoxia: Secondary | ICD-10-CM | POA: Diagnosis not present

## 2020-12-06 DIAGNOSIS — R2689 Other abnormalities of gait and mobility: Secondary | ICD-10-CM | POA: Diagnosis not present

## 2020-12-06 DIAGNOSIS — M6281 Muscle weakness (generalized): Secondary | ICD-10-CM | POA: Diagnosis not present

## 2020-12-08 ENCOUNTER — Telehealth: Payer: Self-pay | Admitting: Cardiology

## 2020-12-08 DIAGNOSIS — R2689 Other abnormalities of gait and mobility: Secondary | ICD-10-CM | POA: Diagnosis not present

## 2020-12-08 DIAGNOSIS — M6281 Muscle weakness (generalized): Secondary | ICD-10-CM | POA: Diagnosis not present

## 2020-12-08 NOTE — Telephone Encounter (Signed)
Called the patient back and let her know that I spoke to her earlier today. As soon as I said that, she told me to disregard everything. Stating she was so sorry that she was looking through her medications after we hung up and that she made a mistake and was taking the 50 mg tablets the whole time and have not missed any.  Advised to continue Losartan 50 mg daily. Patient verbalized understanding.

## 2020-12-08 NOTE — Telephone Encounter (Signed)
If that is her blood pressure off the losartan, then I would decrease the dose to 25mg  daily instead of the 50mg  daily. If that is her blood pressure on the medication, no changes.

## 2020-12-08 NOTE — Telephone Encounter (Signed)
Patient states she was taking two of the 50 mg Losartan tablets to make 100 mg after her dose was increased 4/19. In the phone note from 5/20 it appears she went back down to 50 mg on the 5/18 because of low BP. Dr. Shari Prows agreed to stay at 50 mg going forward. After the 50 mg tablets ran out she did not replace them and has not been taking it. She is not sure when this occurred. Wants to make sure it is still okay to take Losartan at 50 mg with current BP's.   Yesterdays BP 124/64  Today BP 125/60

## 2020-12-08 NOTE — Telephone Encounter (Signed)
New Message:     Pt said she just realized she was supposed to be taking Losartan, but have not been taking it. She wants to know what she needs to do?

## 2020-12-11 DIAGNOSIS — R2689 Other abnormalities of gait and mobility: Secondary | ICD-10-CM | POA: Diagnosis not present

## 2020-12-11 DIAGNOSIS — M6281 Muscle weakness (generalized): Secondary | ICD-10-CM | POA: Diagnosis not present
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
## 2020-12-14 DIAGNOSIS — M6281 Muscle weakness (generalized): Secondary | ICD-10-CM | POA: Diagnosis not present

## 2020-12-14 DIAGNOSIS — R2689 Other abnormalities of gait and mobility: Secondary | ICD-10-CM | POA: Diagnosis not present

## 2020-12-15 ENCOUNTER — Other Ambulatory Visit: Payer: Self-pay

## 2020-12-15 ENCOUNTER — Ambulatory Visit (INDEPENDENT_AMBULATORY_CARE_PROVIDER_SITE_OTHER): Payer: Medicare HMO | Admitting: Ophthalmology

## 2020-12-15 ENCOUNTER — Encounter (INDEPENDENT_AMBULATORY_CARE_PROVIDER_SITE_OTHER): Payer: Self-pay | Admitting: Ophthalmology

## 2020-12-15 DIAGNOSIS — H401132 Primary open-angle glaucoma, bilateral, moderate stage: Secondary | ICD-10-CM | POA: Diagnosis not present

## 2020-12-15 DIAGNOSIS — I10 Essential (primary) hypertension: Secondary | ICD-10-CM

## 2020-12-15 DIAGNOSIS — H3581 Retinal edema: Secondary | ICD-10-CM

## 2020-12-15 DIAGNOSIS — Z961 Presence of intraocular lens: Secondary | ICD-10-CM

## 2020-12-15 DIAGNOSIS — H35033 Hypertensive retinopathy, bilateral: Secondary | ICD-10-CM | POA: Diagnosis not present

## 2020-12-15 DIAGNOSIS — H31003 Unspecified chorioretinal scars, bilateral: Secondary | ICD-10-CM | POA: Diagnosis not present

## 2020-12-15 DIAGNOSIS — E119 Type 2 diabetes mellitus without complications: Secondary | ICD-10-CM | POA: Diagnosis not present

## 2020-12-15 DIAGNOSIS — J449 Chronic obstructive pulmonary disease, unspecified: Secondary | ICD-10-CM | POA: Diagnosis not present

## 2020-12-15 DIAGNOSIS — J432 Centrilobular emphysema: Secondary | ICD-10-CM | POA: Diagnosis not present

## 2020-12-15 DIAGNOSIS — H532 Diplopia: Secondary | ICD-10-CM

## 2020-12-15 DIAGNOSIS — J9611 Chronic respiratory failure with hypoxia: Secondary | ICD-10-CM | POA: Diagnosis not present

## 2020-12-18 DIAGNOSIS — R2689 Other abnormalities of gait and mobility: Secondary | ICD-10-CM | POA: Diagnosis not present

## 2020-12-18 DIAGNOSIS — M6281 Muscle weakness (generalized): Secondary | ICD-10-CM | POA: Diagnosis not present

## 2020-12-19 DIAGNOSIS — J449 Chronic obstructive pulmonary disease, unspecified: Secondary | ICD-10-CM | POA: Diagnosis not present

## 2020-12-21 DIAGNOSIS — M6281 Muscle weakness (generalized): Secondary | ICD-10-CM | POA: Diagnosis not present

## 2020-12-21 DIAGNOSIS — R2689 Other abnormalities of gait and mobility: Secondary | ICD-10-CM | POA: Diagnosis not present

## 2020-12-27 DIAGNOSIS — M6281 Muscle weakness (generalized): Secondary | ICD-10-CM | POA: Diagnosis not present

## 2020-12-27 DIAGNOSIS — R2689 Other abnormalities of gait and mobility: Secondary | ICD-10-CM | POA: Diagnosis not present

## 2020-12-29 DIAGNOSIS — R2689 Other abnormalities of gait and mobility: Secondary | ICD-10-CM | POA: Diagnosis not present

## 2020-12-29 DIAGNOSIS — M6281 Muscle weakness (generalized): Secondary | ICD-10-CM | POA: Diagnosis not present

## 2021-01-01 DIAGNOSIS — J449 Chronic obstructive pulmonary disease, unspecified: Secondary | ICD-10-CM | POA: Diagnosis not present

## 2021-01-02 DIAGNOSIS — M6281 Muscle weakness (generalized): Secondary | ICD-10-CM | POA: Diagnosis not present

## 2021-01-02 DIAGNOSIS — R2689 Other abnormalities of gait and mobility: Secondary | ICD-10-CM | POA: Diagnosis not present

## 2021-01-06 DIAGNOSIS — J432 Centrilobular emphysema: Secondary | ICD-10-CM | POA: Diagnosis not present

## 2021-01-06 DIAGNOSIS — J9611 Chronic respiratory failure with hypoxia: Secondary | ICD-10-CM | POA: Diagnosis not present

## 2021-01-06 DIAGNOSIS — J449 Chronic obstructive pulmonary disease, unspecified: Secondary | ICD-10-CM | POA: Diagnosis not present

## 2021-01-09 DIAGNOSIS — M6281 Muscle weakness (generalized): Secondary | ICD-10-CM | POA: Diagnosis not present

## 2021-01-09 DIAGNOSIS — R2689 Other abnormalities of gait and mobility: Secondary | ICD-10-CM | POA: Diagnosis not present

## 2021-01-12 DIAGNOSIS — R2689 Other abnormalities of gait and mobility: Secondary | ICD-10-CM | POA: Diagnosis not present

## 2021-01-12 DIAGNOSIS — M6281 Muscle weakness (generalized): Secondary | ICD-10-CM | POA: Diagnosis not present

## 2021-01-12 DIAGNOSIS — J449 Chronic obstructive pulmonary disease, unspecified: Secondary | ICD-10-CM | POA: Diagnosis not present

## 2021-01-14 DIAGNOSIS — J449 Chronic obstructive pulmonary disease, unspecified: Secondary | ICD-10-CM | POA: Diagnosis not present

## 2021-01-14 DIAGNOSIS — J432 Centrilobular emphysema: Secondary | ICD-10-CM | POA: Diagnosis not present

## 2021-01-14 DIAGNOSIS — J9611 Chronic respiratory failure with hypoxia: Secondary | ICD-10-CM | POA: Diagnosis not present

## 2021-01-16 DIAGNOSIS — M6281 Muscle weakness (generalized): Secondary | ICD-10-CM | POA: Diagnosis not present

## 2021-01-16 DIAGNOSIS — R2689 Other abnormalities of gait and mobility: Secondary | ICD-10-CM | POA: Diagnosis not present

## 2021-01-19 DIAGNOSIS — R2689 Other abnormalities of gait and mobility: Secondary | ICD-10-CM | POA: Diagnosis not present

## 2021-01-19 DIAGNOSIS — M6281 Muscle weakness (generalized): Secondary | ICD-10-CM | POA: Diagnosis not present

## 2021-01-22 DIAGNOSIS — J449 Chronic obstructive pulmonary disease, unspecified: Secondary | ICD-10-CM | POA: Diagnosis not present

## 2021-01-23 DIAGNOSIS — G629 Polyneuropathy, unspecified: Secondary | ICD-10-CM | POA: Diagnosis not present

## 2021-01-23 DIAGNOSIS — E119 Type 2 diabetes mellitus without complications: Secondary | ICD-10-CM | POA: Diagnosis not present

## 2021-01-23 DIAGNOSIS — Z7984 Long term (current) use of oral hypoglycemic drugs: Secondary | ICD-10-CM | POA: Diagnosis not present

## 2021-01-29 NOTE — Progress Notes (Signed)
Cardiology Office Note:    Date:  01/31/2021   ID:  Rebecca [name], DOB 1941/03/19, MRN [id number]  PCP:  Farris Has, MD   [facility]  Cardiologist:  None  Advanced Practice Provider:  No care team member to display Electrophysiologist:  None    Referring MD: Farris Has, MD    History of Present Illness:    Rebecca [name] is a 80 y.o. 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  Initially seen on 07/27/20. Patient reported that she has a history of viral cardiomyopathy in 2010 for which she was followed by Cardiology with serial TTEs. She was told by her Cardiologist in [place] that her EF recovered and she did not need to follow up further. During our visit, she was complaining of worsening DOE worse than her chronic SOB associated with her COPD for which she is on continuous O2. We obtained a TTE which revealed LVEF 40-45% with anteroseptal, mid inferoseptal and basal inferoseptal hypokinesis. Follow-up SPECT showed no evidence of ischemia or infarction but abnormal septal motion consistent with LBBB.   Last seen in clinic on 10/31/20 where she continued to have DOE as well as elevated blood pressure. We discussed the option of cath vs medical optimization and she wanted to pursue medication at that time.  We increased losartan to 100mg  daily and started spiro at that time.  Today, the patient states she continues to short winded with exertion. Has been seen by Pulm and suspect she is having progression of her underlying COPD. Also notably B12 deficient and has started supplementation. Currently has a UTI as well and has not gotten an ABX yet. She has previously taken macrobid with resolution of symptoms.   Otherwise, no chest pain, orthopena or PND. Has been taking lasix 20mg  daily but no significant improvement in symptoms and  has gained weight. She is wondering if we can increase the dose.     Past Medical History:  Diagnosis Date   Congestive heart disease (HCC)    COPD (chronic obstructive pulmonary disease) (HCC)    Diabetes (HCC)    Hyperlipemia    Osteoporosis    Primary hypertension     No past surgical history on file.  Current Medications: Current Meds  Medication Sig   acetaminophen (TYLENOL) 500 MG tablet Take 1,000 mg by mouth 2 (two) times daily as needed for mild pain or headache.   albuterol (PROVENTIL) (2.5 MG/3ML) 0.083% nebulizer solution TAKE 3 MLS (2.5 MG TOTAL) BY NEBULIZATION IN THE MORNING, AT NOON, AND AT BEDTIME. USE AS DIRECTED   albuterol (VENTOLIN HFA) 108 (90 Base) MCG/ACT inhaler Inhale 1-2 puffs into the lungs every 6 (six) hours as needed for wheezing or shortness of breath.   brimonidine (ALPHAGAN) 0.2 % ophthalmic solution Place 1 drop into the left eye 2 (two) times daily.   BROVANA 15 MCG/2ML NEBU USE 1 VIAL  IN  NEBULIZER TWICE  DAILY - morning and evening   budesonide (PULMICORT) 0.5 MG/2ML nebulizer solution USE 1 VIAL  IN  NEBULIZER TWICE  DAILY - Rinse mouth after treatment   Calcium Carbonate-Vitamin D 600-200 MG-UNIT TABS Take 1 tablet by mouth daily.    carboxymethylcellulose (REFRESH PLUS) 0.5 % SOLN 1 drop 3 (three) times daily as needed.   carvedilol (COREG) 25 MG tablet Take 25 mg by mouth 2 (two)  times daily.    estradiol (ESTRACE) 0.1 MG/GM vaginal cream Place 1 Applicatorful vaginally every other day.    furosemide (LASIX) 20 MG tablet Take 40 mg by mouth daily on Mon, Wed, Fri, and take 20 mg by mouth daily on Tue, Thurs, Sat, and Sun.   ipratropium (ATROVENT) 0.02 % nebulizer solution Take 0.5 mg by nebulization 4 (four) times daily.   losartan (COZAAR) 50 MG tablet Take 1 tablet (50 mg total) by mouth daily.   metFORMIN (GLUCOPHAGE) 500 MG tablet Take 500 mg by mouth daily with supper.    nitrofurantoin, macrocrystal-monohydrate, (MACROBID) 100 MG  capsule Take 1 capsule (100 mg total) by mouth 2 (two) times daily for 10 days.   Potassium Chloride ER 20 MEQ TBCR Take 1 tablet by mouth daily at 12 noon. When you take furosemide   spironolactone (ALDACTONE) 25 MG tablet Take 0.5 tablets (12.5 mg total) by mouth daily.   vitamin B-12 (CYANOCOBALAMIN) 1000 MCG tablet Take 1,000 mcg by mouth daily.   [DISCONTINUED] furosemide (LASIX) 20 MG tablet Take 20 mg by mouth daily.     Allergies:   Pravastatin sodium, Rosuvastatin calcium, Simvastatin, Amoxicillin, Lisinopril, Sulfacetamide, and Tetracyclines & related   Social History   Socioeconomic History   Marital status: Widowed    Spouse name: Not on file   Number of children: Not on file   Years of education: Not on file   Highest education level: Not on file  Occupational History   Not on file  Tobacco Use   Smoking status: Former    Packs/day: 1.50    Years: 40.00    Pack years: 60.00    Types: Cigarettes    Quit date: 2003    Years since quitting: 19.5   Smokeless tobacco: Never  Vaping Use   Vaping Use: Never used  Substance and Sexual Activity   Alcohol use: Yes    Alcohol/week: 1.0 standard drink    Types: 1 Glasses of wine per week   Drug use: Never   Sexual activity: Not on file  Other Topics Concern   Not on file  Social History Narrative   Not on file   Social Determinants of Health   Financial Resource Strain: Not on file  Food Insecurity: Not on file  Transportation Needs: Not on file  Physical Activity: Not on file  Stress: Not on file  Social Connections: Not on file     Family History: The patient's family history is not on file.  ROS:   Please see the history of present illness. Review of Systems  Constitutional:  Positive for malaise/fatigue. Negative for chills and fever.  HENT:  Negative for sore throat.   Eyes:  Negative for blurred vision.  Respiratory:  Positive for shortness of breath.   Cardiovascular:  Positive for leg swelling.  Negative for chest pain, palpitations, orthopnea, claudication and PND.  Gastrointestinal:  Negative for nausea and vomiting.  Genitourinary:  Positive for frequency and urgency.  Musculoskeletal:  Positive for joint pain. Negative for falls.  Neurological:  Negative for dizziness and loss of consciousness.  Psychiatric/Behavioral:  Negative for substance abuse.     EKGs/Labs/Other Studies Reviewed:    The following studies were reviewed today: TTE 27-Aug-2020: IMPRESSIONS   1. Left ventricular ejection fraction, by estimation, is 40 to 45%. The  left ventricle has mildly decreased function. The left ventricle  demonstrates regional wall motion abnormalities (see scoring  diagram/findings for description). Left ventricular  diastolic parameters are consistent  with Grade I diastolic dysfunction  (impaired relaxation).   2. Right ventricular systolic function is normal. The right ventricular  size is normal.   3. The mitral valve is normal in structure. No evidence of mitral valve  regurgitation.   4. The aortic valve is grossly normal. Aortic valve regurgitation is not  visualized.   FINDINGS   Left Ventricle: Left ventricular ejection fraction, by estimation, is 40  to 45%. The left ventricle has mildly decreased function. The left  ventricle demonstrates regional wall motion abnormalities. The left  ventricular internal cavity size was small.  There is no left ventricular hypertrophy. Left ventricular diastolic  parameters are consistent with Grade I diastolic dysfunction (impaired  relaxation).      LV Wall Scoring:  The anterior septum, mid inferoseptal segment, and basal inferoseptal  segment  are hypokinetic.   Right Ventricle: The right ventricular size is normal. No increase in  right ventricular wall thickness. Right ventricular systolic function is  normal.   Left Atrium: Left atrial size was normal in size.   Right Atrium: Right atrial size was not well  visualized.   Pericardium: Trivial pericardial effusion is present.   Mitral Valve: The mitral valve is normal in structure. No evidence of  mitral valve regurgitation.   Tricuspid Valve: The tricuspid valve is normal in structure. Tricuspid  valve regurgitation is not demonstrated.   Aortic Valve: The aortic valve is grossly normal. There is mild aortic  valve annular calcification. Aortic valve regurgitation is not visualized.   Pulmonic Valve: The pulmonic valve was not well visualized. Pulmonic valve  regurgitation is not visualized.   Aorta: The aortic root is normal in size and structure and the ascending  aorta was not well visualized.   IAS/Shunts: The interatrial septum is aneurysmal. The atrial septum is  grossly normal.   Myoview 08/07/20: The left ventricular ejection fraction is mildly decreased (45-54%). Nuclear stress EF: 46%. There was no ST segment deviation noted during stress. No T wave inversion was noted during stress. The study is normal. This is a low risk study.   1. There are reduced counts in the septal segments on rest imaging that improve on stress imaging with normal wall motion consistent with left bundle branch block artifact. No evidence of ischemia or infarction.  2. Mildly reduced LVEF, 46%. Septal movement consistent with LBBB. 3. This is an low- to intermediate-risk study.   CTA chest 01/12/20: FINDINGS: Cardiovascular: Satisfactory opacification of the pulmonary arteries to the segmental level. No evidence of pulmonary embolism. Normal heart size. No pericardial effusion. Coronary artery calcifications are noted. Atherosclerosis of thoracic aorta is noted without aneurysm formation.   Mediastinum/Nodes: Small sliding-type hiatal hernia is noted. No adenopathy is noted. Thyroid gland is unremarkable.   Lungs/Pleura: No pneumothorax or pleural effusion is noted. 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 Mild  bilateral posterior basilar subsegmental atelectasis is noted.   Upper Abdomen: Bilateral nephrolithiasis is noted.   Musculoskeletal: No chest wall abnormality. No acute or significant osseous findings.   Review of the MIP images confirms the above findings.   IMPRESSION: 1. No definite evidence of pulmonary embolus. 2. Coronary artery calcifications are noted suggesting coronary artery disease. 3. Small sliding-type hiatal hernia. 4. Bilateral nephrolithiasis.  EKG:   No ECG done today  Recent Labs: 02/21/2020: ALT 14; B Natriuretic Peptide 116.4; Hemoglobin 11.0; Platelets 233 11/09/2020: BUN 22; Creatinine, Ser 0.73; Potassium 5.0; Sodium 142  Recent Lipid Panel No results found for: CHOL,  TRIG, HDL, CHOLHDL, VLDL, LDLCALC, LDLDIRECT   Risk Assessment/Calculations:       Physical Exam:    VS:  BP 128/82   Pulse 78   Ht 5\' 2"  (1.575 m)   Wt 155 lb 12.8 oz (70.7 kg)   SpO2 99%   BMI 28.50 kg/m     Wt Readings from Last 3 Encounters:  01/31/21 155 lb 12.8 oz (70.7 kg)  11/28/20 148 lb (67.1 kg)  10/31/20 153 lb 6.4 oz (69.6 kg)     GEN: Comfortable, O2 in place, in wheel chair HEENT: Normal NECK: No JVD; No carotid bruits CARDIAC: RRR, no murmurs, rubs, gallops RESPIRATORY:  Diminished but clear ABDOMEN: Soft, non-tender, non-distended MUSCULOSKELETAL:  trace-1+ LE edema to the mid-shin. Warm. SKIN: Warm and dry NEUROLOGIC:  Alert and oriented x 3 PSYCHIATRIC:  Normal affect   ASSESSMENT:    1. Chronic systolic heart failure (HCC)   2. Medication management   3. Type 2 diabetes mellitus with complication, without long-term current use of insulin (HCC)   4. Hypertension, unspecified type   5. Hyperlipidemia, unspecified hyperlipidemia type   6. Chronic obstructive pulmonary disease, unspecified COPD type (HCC)   7. Left bundle branch block   8. SOB (shortness of breath)   9. Acute cystitis without hematuria     PLAN:    In order of problems listed  above:  #Chronic Systolic Heart Failure with LVEF 40-45% #History of viral cardiomyopathy #Wall Motion Abnormalities on TTE Patient with reported history of viral cardiomyopathy [place] in [place]. Cath in 2010 performed that was reportedly normal. Was told that her pumping function recovered and she did not need to follow with Cardiology in [place]. During last visit on 07/2020, she complained of worsening SOB, DOE, and LE edema. TTE showed LVEF 40-45% with septal and basal inferior wall hypokinesis. Myoview without ischemia or infarction.  -Will continue with medical management at this time and pursue cath if symptoms persist -Continue losartan 100mg  daily -Continue coreg 25mg  BID -Increase lasix to 40mg   M, W, F and 20mg  other days -No SGLT-2i due to frequent UTIs -Continue spironolactone 12.5mg  daily -Low Na diet   #Chronic hypoxic respiratory failure #COPD: Follows closely with pulm. -Follow-up with pulm as scheduled -Continue supplemental O2   #HTN -Continue coreg 25mg  BID -Continue losartan 100mg  daily  -Continue spironolactone 12.5mg  daily as above -Monitor blood pressures with goal <120s/80s   #LBBB: States this is chronic. Has history of cath in 2010 at OSH which was reportedly normal. Myoview with no ischemia or infarction. TTE with mildly reduced LVEF 40-45% with septal and inferior WMA -Medical management as above -If above management fails, will proceed with coronary angiography   #HLD Managed by PCP. Intolerant to crestor and simva -Continue prava 20mg  daily and monitor   #DMII -Continue metformin -No SGLT2i due to frequent UTI  #UTI: Recommend follow-up with PCP. Will prescribe macrobid in the interim. -Start macrobid 100mg  BID for 10 days -Follow-up with PCP   Medication Adjustments/Labs and Tests Ordered: Current medicines are reviewed at length with the patient today.  Concerns regarding medicines are outlined above.  Orders Placed This  Encounter  Procedures   Basic metabolic panel    Meds ordered this encounter  Medications   nitrofurantoin, macrocrystal-monohydrate, (MACROBID) 100 MG capsule    Sig: Take 1 capsule (100 mg total) by mouth 2 (two) times daily for 10 days.    Dispense:  20 capsule    Refill:  0   furosemide (  LASIX) 20 MG tablet    Sig: Take 40 mg by mouth daily on Mon, Wed, Fri, and take 20 mg by mouth daily on Tue, Thurs, Sat, and Sun.    Dispense:  40 tablet    Refill:  1    Dose increase     Patient Instructions  Medication Instructions:   START MACROBID 100 MG TWICE DAILY FOR 10 DAYS ONLY.  INCREASE YOUR LASIX TO TAKING 40 MG BY MOUTH ON Monday, Wednesday AND FRIDAYS, THEN TAKE 20 MG BY MOUTH ON Tuesday, Thursday, Saturday, AND Sunday.   *If you need a refill on your cardiac medications before your next appointment, please call your pharmacy*   Lab Work:  IN 2 WEEKS HERE IN THE OFFICE--BMET  If you have labs (blood work) drawn today and your tests are completely normal, you will receive your results only by: MyChart Message (if you have MyChart) OR A paper copy in the mail If you have any lab test that is abnormal or we need to change your treatment, we will call you to review the results.   Follow-Up:  6 MONTHS IN THE OFFICE WITH DR. Shari Prows     Follow-up in 4-6 months.    Signed, Meriam Sprague, MD  01/31/2021 10:55 AM    [facility]

## 2021-01-30 DIAGNOSIS — J449 Chronic obstructive pulmonary disease, unspecified: Secondary | ICD-10-CM | POA: Diagnosis not present

## 2021-01-31 ENCOUNTER — Encounter: Payer: Self-pay | Admitting: Cardiology

## 2021-01-31 ENCOUNTER — Ambulatory Visit: Payer: Medicare HMO | Admitting: Cardiology

## 2021-01-31 ENCOUNTER — Other Ambulatory Visit: Payer: Self-pay

## 2021-01-31 VITALS — BP 128/82 | HR 78 | Ht 62.0 in | Wt 155.8 lb

## 2021-01-31 DIAGNOSIS — I10 Essential (primary) hypertension: Secondary | ICD-10-CM

## 2021-01-31 DIAGNOSIS — Z79899 Other long term (current) drug therapy: Secondary | ICD-10-CM

## 2021-01-31 DIAGNOSIS — E785 Hyperlipidemia, unspecified: Secondary | ICD-10-CM

## 2021-01-31 DIAGNOSIS — E118 Type 2 diabetes mellitus with unspecified complications: Secondary | ICD-10-CM

## 2021-01-31 DIAGNOSIS — R0602 Shortness of breath: Secondary | ICD-10-CM | POA: Diagnosis not present

## 2021-01-31 DIAGNOSIS — J449 Chronic obstructive pulmonary disease, unspecified: Secondary | ICD-10-CM | POA: Diagnosis not present

## 2021-01-31 DIAGNOSIS — I447 Left bundle-branch block, unspecified: Secondary | ICD-10-CM | POA: Diagnosis not present

## 2021-01-31 DIAGNOSIS — I5022 Chronic systolic (congestive) heart failure: Secondary | ICD-10-CM | POA: Diagnosis not present

## 2021-01-31 DIAGNOSIS — N3000 Acute cystitis without hematuria: Secondary | ICD-10-CM | POA: Diagnosis not present

## 2021-01-31 MED ORDER — FUROSEMIDE 20 MG PO TABS: ORAL_TABLET | ORAL | 1 refills | Status: DC

## 2021-01-31 MED ORDER — NITROFURANTOIN MONOHYD MACRO 100 MG PO CAPS: 100.0000 mg | ORAL_CAPSULE | Freq: Two times a day (BID) | ORAL | 0 refills | Status: AC

## 2021-01-31 NOTE — Patient Instructions (Signed)
Medication Instructions:   START MACROBID 100 MG TWICE DAILY FOR 10 DAYS ONLY.  INCREASE YOUR LASIX TO TAKING 40 MG BY MOUTH ON Monday, Wednesday AND FRIDAYS, THEN TAKE 20 MG BY MOUTH ON Tuesday, Thursday, Saturday, AND Sunday.   *If you need a refill on your cardiac medications before your next appointment, please call your pharmacy*   Lab Work:  IN 2 WEEKS HERE IN THE OFFICE--BMET  If you have labs (blood work) drawn today and your tests are completely normal, you will receive your results only by: MyChart Message (if you have MyChart) OR A paper copy in the mail If you have any lab test that is abnormal or we need to change your treatment, we will call you to review the results.   Follow-Up:  6 MONTHS IN THE OFFICE WITH DR. Shari Prows

## 2021-02-03 ENCOUNTER — Other Ambulatory Visit: Payer: Self-pay | Admitting: Adult Health

## 2021-02-05 DIAGNOSIS — J432 Centrilobular emphysema: Secondary | ICD-10-CM | POA: Diagnosis not present

## 2021-02-05 DIAGNOSIS — J9611 Chronic respiratory failure with hypoxia: Secondary | ICD-10-CM | POA: Diagnosis not present

## 2021-02-05 DIAGNOSIS — J449 Chronic obstructive pulmonary disease, unspecified: Secondary | ICD-10-CM | POA: Diagnosis not present

## 2021-02-09 DIAGNOSIS — H5051 Esophoria: Secondary | ICD-10-CM | POA: Diagnosis not present

## 2021-02-09 DIAGNOSIS — H532 Diplopia: Secondary | ICD-10-CM | POA: Diagnosis not present

## 2021-02-15 ENCOUNTER — Other Ambulatory Visit: Payer: Medicare HMO

## 2021-02-16 ENCOUNTER — Other Ambulatory Visit: Payer: Self-pay

## 2021-02-16 ENCOUNTER — Other Ambulatory Visit: Payer: Medicare HMO | Admitting: *Deleted

## 2021-02-16 DIAGNOSIS — I5022 Chronic systolic (congestive) heart failure: Secondary | ICD-10-CM

## 2021-02-16 DIAGNOSIS — Z79899 Other long term (current) drug therapy: Secondary | ICD-10-CM

## 2021-02-16 DIAGNOSIS — E118 Type 2 diabetes mellitus with unspecified complications: Secondary | ICD-10-CM | POA: Diagnosis not present

## 2021-02-16 LAB — BASIC METABOLIC PANEL
BUN/Creatinine Ratio: 28 (ref 12–28)
BUN: 22 mg/dL (ref 8–27)
CO2: 32 mmol/L — ABNORMAL HIGH (ref 20–29)
Calcium: 9.8 mg/dL (ref 8.7–10.3)
Chloride: 88 mmol/L — ABNORMAL LOW (ref 96–106)
Creatinine, Ser: 0.8 mg/dL (ref 0.57–1.00)
Glucose: 91 mg/dL (ref 65–99)
Potassium: 5.2 mmol/L (ref 3.5–5.2)
Sodium: 132 mmol/L — ABNORMAL LOW (ref 134–144)
eGFR: 74 mL/min/{1.73_m2} (ref >59)

## 2021-02-19 DIAGNOSIS — J449 Chronic obstructive pulmonary disease, unspecified: Secondary | ICD-10-CM | POA: Diagnosis not present

## 2021-02-20 DIAGNOSIS — I10 Essential (primary) hypertension: Secondary | ICD-10-CM | POA: Diagnosis not present

## 2021-02-20 DIAGNOSIS — J449 Chronic obstructive pulmonary disease, unspecified: Secondary | ICD-10-CM | POA: Diagnosis not present

## 2021-02-20 DIAGNOSIS — G629 Polyneuropathy, unspecified: Secondary | ICD-10-CM | POA: Diagnosis not present

## 2021-02-20 DIAGNOSIS — E1169 Type 2 diabetes mellitus with other specified complication: Secondary | ICD-10-CM | POA: Diagnosis not present

## 2021-02-20 DIAGNOSIS — I509 Heart failure, unspecified: Secondary | ICD-10-CM | POA: Diagnosis not present

## 2021-02-20 DIAGNOSIS — I700 Atherosclerosis of aorta: Secondary | ICD-10-CM | POA: Diagnosis not present

## 2021-02-26 DIAGNOSIS — H401131 Primary open-angle glaucoma, bilateral, mild stage: Secondary | ICD-10-CM | POA: Diagnosis not present

## 2021-02-26 DIAGNOSIS — E119 Type 2 diabetes mellitus without complications: Secondary | ICD-10-CM | POA: Diagnosis not present

## 2021-02-26 DIAGNOSIS — H532 Diplopia: Secondary | ICD-10-CM | POA: Diagnosis not present

## 2021-02-27 ENCOUNTER — Other Ambulatory Visit: Payer: Self-pay | Admitting: *Deleted

## 2021-02-27 DIAGNOSIS — J449 Chronic obstructive pulmonary disease, unspecified: Secondary | ICD-10-CM | POA: Diagnosis not present

## 2021-02-27 MED ORDER — PRAVASTATIN SODIUM 10 MG PO TABS: 10.0000 mg | ORAL_TABLET | Freq: Every day | ORAL | 1 refills | Status: DC

## 2021-03-05 ENCOUNTER — Telehealth: Payer: Self-pay | Admitting: Adult Health

## 2021-03-05 NOTE — Telephone Encounter (Signed)
I checked both TP and RA's inboxes for these OT forms and did not find anything for this patient.   Heather, have you seen any forms for her?

## 2021-03-05 NOTE — Telephone Encounter (Signed)
Closing encounter as this was discussed in a MyChart message from 03/05/21.

## 2021-03-08 ENCOUNTER — Telehealth: Payer: Self-pay | Admitting: Pulmonary Disease

## 2021-03-08 DIAGNOSIS — M6281 Muscle weakness (generalized): Secondary | ICD-10-CM | POA: Diagnosis not present

## 2021-03-08 DIAGNOSIS — J9611 Chronic respiratory failure with hypoxia: Secondary | ICD-10-CM | POA: Diagnosis not present

## 2021-03-08 DIAGNOSIS — J432 Centrilobular emphysema: Secondary | ICD-10-CM | POA: Diagnosis not present

## 2021-03-08 DIAGNOSIS — J449 Chronic obstructive pulmonary disease, unspecified: Secondary | ICD-10-CM | POA: Diagnosis not present

## 2021-03-08 NOTE — Telephone Encounter (Signed)
Received [employer] form for OT, placed at [employer] for signature.

## 2021-03-13 DIAGNOSIS — M6281 Muscle weakness (generalized): Secondary | ICD-10-CM | POA: Diagnosis not present

## 2021-03-14 DIAGNOSIS — M6281 Muscle weakness (generalized): Secondary | ICD-10-CM | POA: Diagnosis not present

## 2021-03-14 NOTE — Telephone Encounter (Signed)
Called the pt to inquire more information about mychart message sent. Pt states she saw Dr. Shari Prows back on 01/31/21, and she advised her at that visit, if sob/doe continues with current medical management, then she should notify the office to make an appt in the next month or so, and not wait until she is due to be seen in Jan 2023. Pt states that Dr. Shari Prows said she could see an APP in her absence, and possibly discuss at that visit, if cardiac cath is necessary. Pt had last cath in 2010 that was normal.  Pt had echo (08/18/20) and myoview (08/07/20) done, and echo revealed LVEF 40-45% and myoview showed no evidence of ischemia or infarction, but abnormal septal motion consistent with LBBB.  Pt states her symptoms haven't worsened since last OV with [place] in July, but they're still there. Pt states her symptoms are sob mostly when exerting.  No sob at rest. She denies exertional chest pain.  She denies chest pain/pressure, palpitations, N/V, diaphoresis, dizziness, pre-syncopal or syncopal episodes.  Pt states she weighs herself daily and weights are controlled, at last weight this morning 153 lbs.  Pt states her swelling in her extremities are controlled as well, with lasix regimen. Pt confirmed she is taking all her meds as prescribed.  Pt had no BP/HR recordings to provide me. Pt states she does have significant COPD, and will see Pulmonology for follow-up on 9/8.  Scheduled the pt to come in and see Tereso Newcomer PA-C for 9/27 at 2:15 pm.  She is aware to arrive 15 mins prior to that appt.  Offered her to see Lorin Picket today, but pt declined stating "it's not urgent to see today, just need to be seen sometime in Sept, after seeing Pulmonology." Pt states she mostly wants to speak with Lorin Picket about other treatment options, and if a cath needs to be done, as indicated at last OV with Dr. Shari Prows.  Pt states she wants to get more details all together about her treatment plan. Advised the pt to see Scott on  9/27, and continue her current regimen, daily weights, monitoring symptoms, and see Pulmonology as scheduled.  Advised the pt to start monitoring her BP/HR at home and logging this.  ED precautions provided to the pt, if symptoms were to worsen between now and her next follow-up appt with Scott on 9/27.  Advised her if they worsen to let us know, so we can bring her in sooner.  Pt verbalized understanding and agrees with this plan.  Pt was more than gracious for all the assistance provided.  Will send this message to Dr. Devin Going in-basket as an Lorain Childes, to covering Cardiologist.

## 2021-03-15 DIAGNOSIS — M6281 Muscle weakness (generalized): Secondary | ICD-10-CM | POA: Diagnosis not present

## 2021-03-16 DIAGNOSIS — J449 Chronic obstructive pulmonary disease, unspecified: Secondary | ICD-10-CM | POA: Diagnosis not present

## 2021-03-20 DIAGNOSIS — M6281 Muscle weakness (generalized): Secondary | ICD-10-CM | POA: Diagnosis not present

## 2021-03-21 DIAGNOSIS — M6281 Muscle weakness (generalized): Secondary | ICD-10-CM | POA: Diagnosis not present

## 2021-03-22 ENCOUNTER — Encounter: Payer: Self-pay | Admitting: Adult Health

## 2021-03-22 ENCOUNTER — Other Ambulatory Visit: Payer: Self-pay

## 2021-03-22 ENCOUNTER — Ambulatory Visit: Payer: Medicare HMO | Admitting: Adult Health

## 2021-03-22 ENCOUNTER — Ambulatory Visit (INDEPENDENT_AMBULATORY_CARE_PROVIDER_SITE_OTHER): Payer: Medicare HMO

## 2021-03-22 VITALS — BP 120/54 | HR 82 | Temp 98.6°F | Ht 62.0 in | Wt 156.4 lb

## 2021-03-22 DIAGNOSIS — J4489 Other specified chronic obstructive pulmonary disease: Secondary | ICD-10-CM

## 2021-03-22 DIAGNOSIS — I428 Other cardiomyopathies: Secondary | ICD-10-CM

## 2021-03-22 DIAGNOSIS — J449 Chronic obstructive pulmonary disease, unspecified: Secondary | ICD-10-CM

## 2021-03-22 DIAGNOSIS — R5381 Other malaise: Secondary | ICD-10-CM | POA: Diagnosis not present

## 2021-03-22 DIAGNOSIS — R0600 Dyspnea, unspecified: Secondary | ICD-10-CM | POA: Diagnosis not present

## 2021-03-22 NOTE — Assessment & Plan Note (Signed)
Severe COPD with emphysema-patient has significant symptom burden. She is on maximum therapy with triple nebulizer maintenance regimen. Today oxygen demands are less than they was earlier this year.  She was able to maintain O2 saturations greater than 90% on 2 L at rest and 3 L with walking. No acute infectious symptoms.  We will hold on antibiotics and steroids at this time. Check chest x-ray.  Plan  Patient Instructions  Chest xray today .  Follow up with Cardiology as planned this month .  Continue on Budesonide Neb Twice daily  .  Continue on Brovana Twice daily  .  Continue on Albuterol and Ipratropium Neb Three times a day   Activity as tolerated Saline nasal spray Twice daily   Saline nasal gel At bedtime   Incentive spirometry daily  Continue with Occupational therapy .  Robitussin Cough syrup As needed  cough/congestion  Continue on oxygen 2l/m rest and 3l/m activity . O2 sats goal >88-90%.  Follow-up with Dr. Vassie Loll or Curly Mackowski NP in 2 months and As needed  Please contact office for sooner follow up if symptoms do not improve or worsen or seek emergency care

## 2021-03-22 NOTE — Assessment & Plan Note (Signed)
Continue to follow-up with cardiology as patient continues to have ongoing exertional shortness of breath.  She does have very severe COPD and emphysema.  May have progressive COPD.  However no increased oxygen demands.  We will check chest x-ray today.   Echo and stress Myoview earlier this year showed  congestive heart failure with EF at 40 to 45%..,  No ischemic changes. There has been discussion that patient may undergo a cardiac catheterization.  Patient's been instructed follow with cardiology as planned  Plan  Patient Instructions  Chest xray today .  Follow up with Cardiology as planned this month .  Continue on Budesonide Neb Twice daily  .  Continue on Brovana Twice daily  .  Continue on Albuterol and Ipratropium Neb Three times a day   Activity as tolerated Saline nasal spray Twice daily   Saline nasal gel At bedtime   Incentive spirometry daily  Continue with Occupational therapy .  Robitussin Cough syrup As needed  cough/congestion  Continue on oxygen 2l/m rest and 3l/m activity . O2 sats goal >88-90%.  Follow-up with Dr. Vassie Loll or Abigayl Hor NP in 2 months and As needed  Please contact office for sooner follow up if symptoms do not improve or worsen or seek emergency care

## 2021-03-22 NOTE — Patient Instructions (Addendum)
Chest xray today .  Follow up with Cardiology as planned this month .  Continue on Budesonide Neb Twice daily  .  Continue on Brovana Twice daily  .  Continue on Albuterol and Ipratropium Neb Three times a day   Activity as tolerated Saline nasal spray Twice daily   Saline nasal gel At bedtime   Incentive spirometry daily  Continue with Occupational therapy .  Robitussin Cough syrup As needed  cough/congestion  Continue on oxygen 2l/m rest and 3l/m activity . O2 sats goal >88-90%.  Follow-up with Dr. Vassie Loll or Carmellia Kreisler NP in 2 months and As needed  Please contact office for sooner follow up if symptoms do not improve or worsen or seek emergency care

## 2021-03-22 NOTE — Addendum Note (Signed)
Addended by: Delrae Rend on: 03/22/2021 11:06 AM   Modules accepted: Orders

## 2021-03-22 NOTE — Progress Notes (Signed)
@Patient  ID: , female    DOB: [birth year], 80 y.o.   MRN: [id number]  Chief Complaint  Patient presents with   Follow-up    Referring provider: [id number], MD  HPI: 80 yo female former smoker followed for severe COPD and chronic respiratory failure on Oxygen since 2010 and lung nodules  Medical history significant for Nonischemic cardiomyopathy, DM, and HTN  Lives in Independent Living at [facility] .   TEST/EVENTS :  Spirometry 01/2018 showed severe airway obstruction with ratio 43, FEV1 of 0.57-31% and FVC of 53%.   CT angio 06/18/18 LUL nodule 36mm   CT chest on February 16, 2019 that showed a stable 1.2 cm left thyroid nodule.  Severe emphysema.  Irregular 6 mm left upper lobe pulmonary nodule-stable and stable right upper lobe 5 mm pulmonary nodule.    Overnight oximetry test September 2021 showed no desaturations on 5 L of oxygen.   CT chest 09/2019 -No CT evidence of pulmonary embolism. 2. Marked severity emphysematous lung disease.(no mention of lung nodules )    CT chest January 12, 2020 negative for PE.  Small sliding hiatal hernia.  Emphysematous changes.  Basilar atelectasis. (no mention of lung nodules )  Chest x-ray February 21, 2020 interstitial thickening of the lung bases and emphysema in the upper lobes.  Hospitalization June 2021 for COPD exacerbation and hypercarbic hypoxic respiratory failure required BiPAP support.  03/22/2021 Follow up : COPD , Oxygen dependent respiratory failure  Patient returns for a 16-month follow-up.  Patient has underlying severe COPD and emphysema and oxygen dependent respiratory failure.  She is on budesonide and Brovana nebulizer twice daily, ipratropium albuterol 3 times daily.  Previously had been tried on 6-month but insurance would not cover.  She is currently on oxygen 6 L at home.  Today in the office walk test revealed she only requires 3 L with oxygen with activity and 2 L at rest.  Patient says that she continues  to feel short of breath with minimum activities.  She is doing occupational therapy but gets winded with easily. Very concerned why she is so short of breath with activities but her oxygen level is >90%.  Previously discussed nocturnal BIPAP support for chronic hypercarbia and frequent COPD flares. She declined but says she is thinking about reconsidering.  She is followed by cardiology for nonischemic cardiomyopathy.  Echo August 18, 2020 showed EF at 40 to 45% grade 1 diastolic dysfunction, right ventricular systolic function was normal and right ventricular size was normal.  Stress Myoview August 07, 2020 showed EF around 46% with no evidence of ischemia.  Patient says that she has a follow-up with cardiology later this month and they have been discussing a left heart cath.  Does have cough but is minimally productive.  No fever or discolored mucus.  Denies any orthopnea or increased leg swelling. Patient lives in independent living.  She does go to her cafeteria 3 times a day.  Does play bingo.   Allergies  Allergen Reactions   Gabapentin Other (See Comments)    Affects breathing   Pravastatin Sodium    Rosuvastatin Calcium Other (See Comments)   Simvastatin Other (See Comments)   Amoxicillin Rash   Lisinopril Rash   Sulfacetamide Rash   Tetracyclines & Related Rash    Immunization History  Administered Date(s) Administered   Fluad Quad(high Dose 65+) 04/07/2020   Influenza Split 04/14/2018, 03/16/2019, 04/07/2019   Influenza, High Dose Seasonal PF 04/14/2018, 05/01/2019  Moderna SARS-COV2 Booster Vaccination 02/10/2021   Moderna Sars-Covid-2 Vaccination 08/16/2019, 09/13/2019, 05/22/2020   Pneumococcal Conjugate-13 11/15/2016, 02/05/2018   Tdap 07/27/2018   Zoster Recombinat (Shingrix) 07/14/2018, 09/14/2018   Zoster, Live 07/14/2018, 09/14/2018    Past Medical History:  Diagnosis Date   Congestive heart disease (HCC)    COPD (chronic obstructive pulmonary disease) (HCC)     Diabetes (HCC)    Hyperlipemia    Osteoporosis    Primary hypertension     Tobacco History: Social History   Tobacco Use  Smoking Status Former   Packs/day: 1.50   Years: 40.00   Pack years: 60.00   Types: Cigarettes   Quit date: 2003   Years since quitting: 19.6  Smokeless Tobacco Never   Counseling given: Not Answered   Outpatient Medications Prior to Visit  Medication Sig Dispense Refill   acetaminophen (TYLENOL) 500 MG tablet Take 1,000 mg by mouth 2 (two) times daily as needed for mild pain or headache.     albuterol (PROVENTIL) (2.5 MG/3ML) 0.083% nebulizer solution INHALE 1 VIAL BY NEBULIZATION EVERY DAY IN THE MORNING, AT NOON, AND AT BEDTIME. USE AS DIRECTED. 225 mL 4   albuterol (VENTOLIN HFA) 108 (90 Base) MCG/ACT inhaler Inhale 1-2 puffs into the lungs every 6 (six) hours as needed for wheezing or shortness of breath. 8 g 5   brimonidine (ALPHAGAN) 0.2 % ophthalmic solution Place 1 drop into the left eye 2 (two) times daily.     BROVANA 15 MCG/2ML NEBU USE 1 VIAL  IN  NEBULIZER TWICE  DAILY - morning and evening 2 mL 11   budesonide (PULMICORT) 0.5 MG/2ML nebulizer solution USE 1 VIAL  IN  NEBULIZER TWICE  DAILY - Rinse mouth after treatment 120 mL 11   Calcium Carbonate-Vitamin D 600-200 MG-UNIT TABS Take 1 tablet by mouth daily.      carboxymethylcellulose (REFRESH PLUS) 0.5 % SOLN 1 drop 3 (three) times daily as needed.     carvedilol (COREG) 25 MG tablet Take 25 mg by mouth 2 (two) times daily.      estradiol (ESTRACE) 0.1 MG/GM vaginal cream Place 1 Applicatorful vaginally every other day.      furosemide (LASIX) 20 MG tablet Take 40 mg by mouth daily on Mon, Wed, Fri, and take 20 mg by mouth daily on Tue, Thurs, Sat, and Sun. 40 tablet 1   ipratropium (ATROVENT) 0.02 % nebulizer solution Take 0.5 mg by nebulization 4 (four) times daily.     losartan (COZAAR) 50 MG tablet Take 1 tablet (50 mg total) by mouth daily. 90 tablet 0   metFORMIN (GLUCOPHAGE) 500 MG  tablet Take 500 mg by mouth daily with supper.      Potassium Chloride ER 20 MEQ TBCR Take 1 tablet by mouth daily at 12 noon. When you take furosemide     pravastatin (PRAVACHOL) 10 MG tablet Take 1 tablet (10 mg total) by mouth daily. 90 tablet 1   spironolactone (ALDACTONE) 25 MG tablet Take 0.5 tablets (12.5 mg total) by mouth daily. 45 tablet 1   vitamin B-12 (CYANOCOBALAMIN) 1000 MCG tablet Take 1,000 mcg by mouth daily.     No facility-administered medications prior to visit.     Review of Systems:   Constitutional:   No  weight loss, night sweats,  Fevers, chills,  +fatigue, or  lassitude.  HEENT:   No headaches,  Difficulty swallowing,  Tooth/dental problems, or  Sore throat,  No sneezing, itching, ear ache, nasal congestion, post nasal drip,   CV:  No chest pain,  Orthopnea, PND, swelling in lower extremities, anasarca, dizziness, palpitations, syncope.   GI  No heartburn, indigestion, abdominal pain, nausea, vomiting, diarrhea, change in bowel habits, loss of appetite, bloody stools.   Resp:   No chest wall deformity  Skin: no rash or lesions.  GU: no dysuria, change in color of urine, no urgency or frequency.  No flank pain, no hematuria   MS:  No joint pain or swelling.  No decreased range of motion.  No back pain.    Physical Exam  BP (!) 120/54 (BP Location: Left Arm, Patient Position: Sitting, Cuff Size: Normal)   Pulse 82   Temp 98.6 F (37 C) (Oral)   Ht 5\' 2"  (1.575 m)   Wt 156 lb 6.4 oz (70.9 kg)   SpO2 97%   BMI 28.61 kg/m   GEN: A/Ox3; pleasant , NAD, chronically ill-appearing, rolling walker, oxygen   HEENT:  [place]/AT,  , NOSE-clear, THROAT-clear, no lesions, no postnasal drip or exudate noted.   NECK:  Supple w/ fair ROM; no JVD; normal carotid impulses w/o bruits; no thyromegaly or nodules palpated; no lymphadenopathy.    RESP diminished breath sounds in the bases   no accessory muscle use, no dullness to percussion  CARD:  RRR,  no m/r/g, tr  peripheral edema, pulses intact, no cyanosis or clubbing.  GI:   Soft & nt; nml bowel sounds; no organomegaly or masses detected.   Musco: Warm bil, no deformities or joint swelling noted.   Neuro: alert, no focal deficits noted.    Skin: Warm, no lesions or rashes    Lab Results:  CBC  BMET  Imaging: No results found.    No flowsheet data found.  No results found for: NITRICOXIDE      Assessment & Plan:   COPD with chronic bronchitis and emphysema (HCC) Severe COPD with emphysema-patient has significant symptom burden. She is on maximum therapy with triple nebulizer maintenance regimen. Today oxygen demands are less than they was earlier this year.  She was able to maintain O2 saturations greater than 90% on 2 L at rest and 3 L with walking. No acute infectious symptoms.  We will hold on antibiotics and steroids at this time. Check chest x-ray.  Plan  Patient Instructions  Chest xray today .  Follow up with Cardiology as planned this month .  Continue on Budesonide Neb Twice daily  .  Continue on Brovana Twice daily  .  Continue on Albuterol and Ipratropium Neb Three times a day   Activity as tolerated Saline nasal spray Twice daily   Saline nasal gel At bedtime   Incentive spirometry daily  Continue with Occupational therapy .  Robitussin Cough syrup As needed  cough/congestion  Continue on oxygen 2l/m rest and 3l/m activity . O2 sats goal >88-90%.  Follow-up with Dr. or Avrey Flanagin NP in 2 months and As needed  Please contact office for sooner follow up if symptoms do not improve or worsen or seek emergency care      Nonischemic cardiomyopathy [facility]) Continue to follow-up with cardiology as patient continues to have ongoing exertional shortness of breath.  She does have very severe COPD and emphysema.  May have progressive COPD.  However no increased oxygen demands.  We will check chest x-ray today.   Echo and stress Myoview earlier this  year showed  congestive heart failure with EF at  40 to 45%..,  No ischemic changes. There has been discussion that patient may undergo a cardiac catheterization.  Patient's been instructed follow with cardiology as planned  Plan  Patient Instructions  Chest xray today .  Follow up with Cardiology as planned this month .  Continue on Budesonide Neb Twice daily  .  Continue on Brovana Twice daily  .  Continue on Albuterol and Ipratropium Neb Three times a day   Activity as tolerated Saline nasal spray Twice daily   Saline nasal gel At bedtime   Incentive spirometry daily  Continue with Occupational therapy .  Robitussin Cough syrup As needed  cough/congestion  Continue on oxygen 2l/m rest and 3l/m activity . O2 sats goal >88-90%.  Follow-up with Dr. Vassie Loll or Gavriela Cashin NP in 2 months and As needed  Please contact office for sooner follow up if symptoms do not improve or worsen or seek emergency care       Physical deconditioning Continue with occupational therapy.  Patient has significant physical deconditioning.   I spent  41  minutes dedicated to the care of this patient on the date of this encounter to include pre-visit review of records, face-to-face time with the patient discussing conditions above, post visit ordering of testing, clinical documentation with the electronic health record, making appropriate referrals as documented, and communicating necessary findings to members of the patients care team.    Rubye Oaks, NP 03/22/2021

## 2021-03-22 NOTE — Assessment & Plan Note (Signed)
Continue with occupational therapy.  Patient has significant physical deconditioning.

## 2021-03-23 DIAGNOSIS — M6281 Muscle weakness (generalized): Secondary | ICD-10-CM | POA: Diagnosis not present

## 2021-03-24 DIAGNOSIS — J449 Chronic obstructive pulmonary disease, unspecified: Secondary | ICD-10-CM

## 2021-03-25 NOTE — Telephone Encounter (Signed)
TP please advise on sending this order?   Please send an RX to [facility] at [facility]  for physical therapy.  Thanks Virgilio Belling     Plan from OV on 03/22/21 with TP      Plan  Patient Instructions  Chest xray today .  Follow up with Cardiology as planned this month .  Continue on Budesonide Neb Twice daily  .  Continue on Brovana Twice daily  .  Continue on Albuterol and Ipratropium Neb Three times a day   Activity as tolerated Saline nasal spray Twice daily   Saline nasal gel At bedtime   Incentive spirometry daily  Continue with Occupational therapy .  Robitussin Cough syrup As needed  cough/congestion  Continue on oxygen 2l/m rest and 3l/m activity . O2 sats goal >88-90%.  Follow-up with Dr. Vassie Loll or Parrett NP in 2 months and As needed  Please contact office for sooner follow up if symptoms do not improve or worsen or seek emergency care

## 2021-03-26 DIAGNOSIS — M6281 Muscle weakness (generalized): Secondary | ICD-10-CM | POA: Diagnosis not present

## 2021-03-26 NOTE — Progress Notes (Signed)
Called and spoke with patient, advised of results/recommendations per Tammy Parrett NP.  She verbalized understanding.  Nothing further needed.

## 2021-03-26 NOTE — Telephone Encounter (Signed)
That is fine for PT eval and tx

## 2021-03-27 DIAGNOSIS — J449 Chronic obstructive pulmonary disease, unspecified: Secondary | ICD-10-CM | POA: Diagnosis not present

## 2021-03-27 DIAGNOSIS — M6281 Muscle weakness (generalized): Secondary | ICD-10-CM | POA: Diagnosis not present

## 2021-03-27 DIAGNOSIS — J45998 Other asthma: Secondary | ICD-10-CM | POA: Diagnosis not present

## 2021-03-30 DIAGNOSIS — M6281 Muscle weakness (generalized): Secondary | ICD-10-CM | POA: Diagnosis not present

## 2021-04-03 DIAGNOSIS — M6281 Muscle weakness (generalized): Secondary | ICD-10-CM | POA: Diagnosis not present

## 2021-04-04 DIAGNOSIS — R0602 Shortness of breath: Secondary | ICD-10-CM | POA: Diagnosis not present

## 2021-04-04 DIAGNOSIS — M6281 Muscle weakness (generalized): Secondary | ICD-10-CM | POA: Diagnosis not present

## 2021-04-04 DIAGNOSIS — R2689 Other abnormalities of gait and mobility: Secondary | ICD-10-CM | POA: Diagnosis not present

## 2021-04-05 DIAGNOSIS — M6281 Muscle weakness (generalized): Secondary | ICD-10-CM | POA: Diagnosis not present

## 2021-04-06 DIAGNOSIS — M6281 Muscle weakness (generalized): Secondary | ICD-10-CM | POA: Diagnosis not present

## 2021-04-08 DIAGNOSIS — J432 Centrilobular emphysema: Secondary | ICD-10-CM | POA: Diagnosis not present

## 2021-04-08 DIAGNOSIS — J449 Chronic obstructive pulmonary disease, unspecified: Secondary | ICD-10-CM | POA: Diagnosis not present

## 2021-04-08 DIAGNOSIS — J9611 Chronic respiratory failure with hypoxia: Secondary | ICD-10-CM | POA: Diagnosis not present

## 2021-04-09 DIAGNOSIS — M6281 Muscle weakness (generalized): Secondary | ICD-10-CM | POA: Diagnosis not present

## 2021-04-10 ENCOUNTER — Ambulatory Visit: Payer: Medicare HMO | Admitting: Physician Assistant

## 2021-04-10 ENCOUNTER — Other Ambulatory Visit: Payer: Self-pay

## 2021-04-10 ENCOUNTER — Encounter: Payer: Self-pay | Admitting: Physician Assistant

## 2021-04-10 VITALS — BP 124/64 | HR 84 | Ht 62.0 in | Wt 156.2 lb

## 2021-04-10 DIAGNOSIS — I2584 Coronary atherosclerosis due to calcified coronary lesion: Secondary | ICD-10-CM

## 2021-04-10 DIAGNOSIS — I5020 Unspecified systolic (congestive) heart failure: Secondary | ICD-10-CM

## 2021-04-10 DIAGNOSIS — I2510 Atherosclerotic heart disease of native coronary artery without angina pectoris: Secondary | ICD-10-CM

## 2021-04-10 DIAGNOSIS — R0602 Shortness of breath: Secondary | ICD-10-CM | POA: Diagnosis not present

## 2021-04-10 DIAGNOSIS — E785 Hyperlipidemia, unspecified: Secondary | ICD-10-CM

## 2021-04-10 DIAGNOSIS — J449 Chronic obstructive pulmonary disease, unspecified: Secondary | ICD-10-CM

## 2021-04-10 DIAGNOSIS — I10 Essential (primary) hypertension: Secondary | ICD-10-CM

## 2021-04-10 DIAGNOSIS — I447 Left bundle-branch block, unspecified: Secondary | ICD-10-CM | POA: Diagnosis not present

## 2021-04-10 NOTE — Progress Notes (Addendum)
Cardiology Office Note:    Date:  04/10/2021   ID:  [name], DOB 1941-07-13, MRN [id number]  PCP:  Farris Has, MD   [facility] Providers Cardiologist:  Meriam Sprague, MD     Referring MD: Farris Has, MD   Chief Complaint:  Shortness of Breath    Patient Profile:   Rebecca [name] is a 80 y.o. female with:  HFmrEF (heart failure with mildly reduced ejection fraction)  Non-ischemic cardiomyopathy  Hx of Viral CM dx in 2010 [facility]) >> EF returned to normal Cath reportedly normal in 2010 EF ? to 40-45 on echocardiogram in 2/22 w ant-sept and inf-sept HK Myoview 1/22: no ischemia, scar; low risk Coronary artery calcification on CT in 6/21 LBBB COPD on home O2 Diabetes mellitus  Hypertension  Hyperlipidemia  Frequent UTIs Therefore SGLT2i not started  Prior CV studies: Echocardiogram 08/18/2020 EF 40-45, anteroseptal and inferoseptal HK, GR 1 DD, normal RVSF   GATED SPECT MYO PERF W/LEXISCAN STRESS 1D 08/07/2020 Narrative 1. There are reduced counts in the septal segments on rest imaging that improve on stress imaging with normal wall motion consistent with left bundle branch block artifact. No evidence of ischemia or infarction. 2. Mildly reduced LVEF, 46%. Septal movement consistent with LBBB. 3. This is an low- to intermediate-risk study.   Chest CTA 01/12/2020 IMPRESSION: 1. No definite evidence of pulmonary embolus. 2. Coronary artery calcifications are noted suggesting coronary artery disease. 3. Small sliding-type hiatal hernia. 4. Bilateral nephrolithiasis.   Aortic Atherosclerosis (ICD10-I70.0) and Emphysema (ICD10-J43.9).   History of Present Illness: Ms. Rebecca [name] was last seen by Dr. Shari Prows in 7/22.  The patient had been followed for worsening dyspnea with exertion.  Echocardiogram earlier this year demonstrated worsening LV function with an EF of 40-45 and anteroseptal, inferoseptal hypokinesis.  Myoview demonstrated no  ischemia or scar.  Cardiac catheterization versus medical management was discussed with the patient.  She preferred medical management initially.  When last seen in July, Dr. Shari Prows noted that cardiac catheterization would be considered if symptoms persist.  She returns for follow-up.  She is here with her daughter.  She continues to have significant shortness of breath with minimal activity.  She has not had shortness of breath at rest.  She is on chronic O2.  She has not had orthopnea, PND, significant leg edema.  She has not had syncope.        Past Medical History:  Diagnosis Date   Congestive heart disease (HCC)    COPD (chronic obstructive pulmonary disease) (HCC)    Diabetes (HCC)    Hyperlipemia    Osteoporosis    Primary hypertension    Current Medications: Current Meds  Medication Sig   acetaminophen (TYLENOL) 500 MG tablet Take 1,000 mg by mouth 2 (two) times daily as needed for mild pain or headache.   albuterol (PROVENTIL) (2.5 MG/3ML) 0.083% nebulizer solution INHALE 1 VIAL BY NEBULIZATION EVERY DAY IN THE MORNING, AT NOON, AND AT BEDTIME. USE AS DIRECTED.   albuterol (VENTOLIN HFA) 108 (90 Base) MCG/ACT inhaler Inhale 1-2 puffs into the lungs every 6 (six) hours as needed for wheezing or shortness of breath.   brimonidine (ALPHAGAN) 0.2 % ophthalmic solution Place 1 drop into the left eye 2 (two) times daily.   BROVANA 15 MCG/2ML NEBU USE 1 VIAL  IN  NEBULIZER TWICE  DAILY - morning and evening   budesonide (PULMICORT) 0.5 MG/2ML nebulizer solution USE 1 VIAL  IN  NEBULIZER TWICE  DAILY -  Rinse mouth after treatment   Calcium Carbonate-Vitamin D 600-200 MG-UNIT TABS Take 1 tablet by mouth daily.    carboxymethylcellulose (REFRESH PLUS) 0.5 % SOLN 1 drop 3 (three) times daily as needed.   carvedilol (COREG) 25 MG tablet Take 25 mg by mouth 2 (two) times daily.    estradiol (ESTRACE) 0.1 MG/GM vaginal cream Place 1 Applicatorful vaginally every other day.    furosemide (LASIX)  20 MG tablet Take 40 mg by mouth daily on Mon, Wed, Fri, and take 20 mg by mouth daily on Tue, Thurs, Sat, and Sun.   ipratropium (ATROVENT) 0.02 % nebulizer solution Take 0.5 mg by nebulization 4 (four) times daily.   losartan (COZAAR) 50 MG tablet Take 1 tablet (50 mg total) by mouth daily.   metFORMIN (GLUCOPHAGE) 500 MG tablet Take 500 mg by mouth daily with supper.    Potassium Chloride ER 20 MEQ TBCR Take 1 tablet by mouth daily at 12 noon. When you take furosemide   spironolactone (ALDACTONE) 25 MG tablet Take 0.5 tablets (12.5 mg total) by mouth daily.   vitamin B-12 (CYANOCOBALAMIN) 1000 MCG tablet Take 1,000 mcg by mouth daily.    Allergies:   Gabapentin, Pravastatin sodium, Rosuvastatin calcium, Simvastatin, Amoxicillin, Lisinopril, Sulfacetamide, and Tetracyclines & related   Social History   Tobacco Use   Smoking status: Former    Packs/day: 1.50    Years: 40.00    Pack years: 60.00    Types: Cigarettes    Quit date: 2003    Years since quitting: 19.7   Smokeless tobacco: Never  Vaping Use   Vaping Use: Never used  Substance Use Topics   Alcohol use: Yes    Alcohol/week: 1.0 standard drink    Types: 1 Glasses of wine per week   Drug use: Never    Family Hx: The patient's family history is not on file.  Review of Systems  Constitutional: Negative for fever.  Respiratory:  Negative for cough.   Gastrointestinal:  Negative for hematochezia and melena.  Genitourinary:  Negative for hematuria.  All other systems reviewed and are negative.   EKGs/Labs/Other Test Reviewed:    EKG:  EKG is   ordered today.  The ekg ordered today demonstrates NSR, HR 84, left bundle branch block, QTC 498  Recent Labs: 02/16/2021: BUN 22; Creatinine, Ser 0.80; Potassium 5.2; Sodium 132   Recent Lipid Panel No results found for: CHOL, TRIG, HDL, LDLCALC, LDLDIRECT   Risk Assessment/Calculations:          Physical Exam:    VS:  BP 124/64   Pulse 84   Ht 5\' 2"  (1.575 m)   Wt  156 lb 3.2 oz (70.9 kg)   SpO2 93%   BMI 28.57 kg/m     Wt Readings from Last 3 Encounters:  04/10/21 156 lb 3.2 oz (70.9 kg)  03/22/21 156 lb 6.4 oz (70.9 kg)  01/31/21 155 lb 12.8 oz (70.7 kg)    Constitutional:      Appearance: Healthy appearance. Not in distress.  Neck:     Thyroid: No thyromegaly.     Vascular: JVD normal.  Pulmonary:     Effort: Pulmonary effort is normal.     Breath sounds: No wheezing. No rales.  Cardiovascular:     Normal rate. Regular rhythm. Normal S1. Normal S2.      Murmurs: There is no murmur.  Edema:    Peripheral edema absent.  Abdominal:     Palpations: Abdomen is soft.  Skin:    General: Skin is warm and dry.  Neurological:     Mental Status: Alert and oriented to person, place and time.     Cranial Nerves: Cranial nerves are intact.       ASSESSMENT & PLAN:   1. 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 Coronary artery calcification 3. Shortness of breath EF 40-45 by echocardiogram 2/22.  She did have anteroseptal and inferoseptal hypokinesis noted.  Myoview in January 2022 demonstrated no ischemia.  She has gradually had worsening shortness of breath over time.  She feels this is out of proportion to her COPD.  She has some chest heaviness when she experiences shortness of breath.  We had a long discussion regarding risks and benefits of right and left heart catheterization and whether or not to proceed.  At this time, she is not ready to proceed and would like to continue to consider this.  I will obtain a BMET, BNP today.  If her BNP is significantly elevated, I will adjust her furosemide.  She cannot take SGLT2 inhibitors due to frequent urinary tract infections.  Continue carvedilol 25 mg twice daily, spironolactone 12.5 mg daily, losartan 50 mg daily.   4. Chronic obstructive pulmonary disease, unspecified COPD type (HCC) Continue follow-up with pulmonology.  She remains on chronic O2.  5. Essential hypertension Blood pressure  is well controlled.  Continue carvedilol 25 mg twice daily, spironolactone 12.5 mg daily, losartan 50 mg daily.  6. Hyperlipidemia, unspecified hyperlipidemia type She is intolerant to statins.  If we do diagnose coronary artery disease, we will need to consider alternatives for lipid management.  7. Left bundle branch block Chronic.   Dispo:  Return in about 3 months (around 07/10/2021) for Routine Follow Up, w/ Dr. Shari Prows, or Tereso Newcomer, PA-C.   Medication Adjustments/Labs and Tests Ordered: Current medicines are reviewed at length with the patient today.  Concerns regarding medicines are outlined above.  Tests Ordered: Orders Placed This Encounter  Procedures   Basic metabolic panel   Pro b natriuretic peptide (BNP)   EKG 12-Lead    Medication Changes: No orders of the defined types were placed in this encounter.  Signed, Tereso Newcomer, PA-C  04/10/2021 3:02 PM    [facility] [address] [place], [place], [place]  [address] Phone: [phone]; Fax: [phone]

## 2021-04-10 NOTE — Patient Instructions (Signed)
Medication Instructions:  Your physician recommends that you continue on your current medications as directed. Please refer to the Current Medication list given to you today.  *If you need a refill on your cardiac medications before your next appointment, please call your pharmacy*   Lab Work: TODAY:  BMET & PRO BNP If you have labs (blood work) drawn today and your tests are completely normal, you will receive your results only by: MyChart Message (if you have MyChart) OR A paper copy in the mail If you have any lab test that is abnormal or we need to change your treatment, we will call you to review the results.   Testing/Procedures:  None ordered   Follow-Up: At [facility], you and your health needs are our priority.  As part of our continuing mission to provide you with exceptional heart care, we have created designated Provider Care Teams.  These Care Teams include your primary Cardiologist (physician) and Advanced Practice Providers (APPs -  Physician Assistants and Nurse Practitioners) who all work together to provide you with the care you need, when you need it.  We recommend signing up for the patient portal called "MyChart".  Sign up information is provided on this After Visit Summary.  MyChart is used to connect with patients for Virtual Visits (Telemedicine).  Patients are able to view lab/test results, encounter notes, upcoming appointments, etc.  Non-urgent messages can be sent to your provider as well.   To learn more about what you can do with MyChart, go to [email or web address].    Your next appointment:   06/19/2021 arrive at 11:00  The format for your next appointment:   In Person  Provider:   Tereso Newcomer, PA-C   Other Instructions

## 2021-04-11 ENCOUNTER — Other Ambulatory Visit: Payer: Self-pay | Admitting: Physician Assistant

## 2021-04-11 ENCOUNTER — Other Ambulatory Visit: Payer: Self-pay | Admitting: *Deleted

## 2021-04-11 ENCOUNTER — Telehealth: Payer: Self-pay | Admitting: Physician Assistant

## 2021-04-11 DIAGNOSIS — E875 Hyperkalemia: Secondary | ICD-10-CM | POA: Diagnosis not present

## 2021-04-11 DIAGNOSIS — I5022 Chronic systolic (congestive) heart failure: Secondary | ICD-10-CM | POA: Diagnosis not present

## 2021-04-11 DIAGNOSIS — Z79899 Other long term (current) drug therapy: Secondary | ICD-10-CM

## 2021-04-11 DIAGNOSIS — E118 Type 2 diabetes mellitus with unspecified complications: Secondary | ICD-10-CM

## 2021-04-11 LAB — BASIC METABOLIC PANEL
BUN/Creatinine Ratio: 21 (ref 12–28)
BUN/Creatinine Ratio: 28 (ref 12–28)
BUN: 27 mg/dL (ref 8–27)
BUN: 29 mg/dL — ABNORMAL HIGH (ref 8–27)
CO2: 29 mmol/L (ref 20–29)
CO2: 37 mmol/L — ABNORMAL HIGH (ref 20–29)
Calcium: 10 mg/dL (ref 8.7–10.3)
Calcium: 9.7 mg/dL (ref 8.7–10.3)
Chloride: 92 mmol/L — ABNORMAL LOW (ref 96–106)
Chloride: 93 mmol/L — ABNORMAL LOW (ref 96–106)
Creatinine, Ser: 0.95 mg/dL (ref 0.57–1.00)
Creatinine, Ser: 1.39 mg/dL — ABNORMAL HIGH (ref 0.57–1.00)
Glucose: 102 mg/dL — ABNORMAL HIGH (ref 70–99)
Glucose: 126 mg/dL — ABNORMAL HIGH (ref 70–99)
Potassium: 5.7 mmol/L — ABNORMAL HIGH (ref 3.5–5.2)
Potassium: 6.1 mmol/L (ref 3.5–5.2)
Sodium: 135 mmol/L (ref 134–144)
Sodium: 137 mmol/L (ref 134–144)
eGFR: 38 mL/min/{1.73_m2} — ABNORMAL LOW (ref >59)
eGFR: 61 mL/min/{1.73_m2} (ref >59)

## 2021-04-11 LAB — PRO B NATRIURETIC PEPTIDE: NT-Pro BNP: 179 pg/mL (ref 0–738)

## 2021-04-11 MED ORDER — FUROSEMIDE 20 MG PO TABS: ORAL_TABLET | ORAL | 1 refills | Status: DC

## 2021-04-11 NOTE — Telephone Encounter (Signed)
Spoke with [employer]. Calling to report potassium was 6.1 on 9/27.  This has been addressed. See lab result

## 2021-04-11 NOTE — Telephone Encounter (Signed)
[facility] calling with critical lab results for this patient.

## 2021-04-12 ENCOUNTER — Telehealth: Payer: Self-pay | Admitting: Nurse Practitioner

## 2021-04-12 DIAGNOSIS — R0602 Shortness of breath: Secondary | ICD-10-CM | POA: Diagnosis not present

## 2021-04-12 DIAGNOSIS — I5022 Chronic systolic (congestive) heart failure: Secondary | ICD-10-CM | POA: Diagnosis not present

## 2021-04-12 DIAGNOSIS — Z79899 Other long term (current) drug therapy: Secondary | ICD-10-CM | POA: Diagnosis not present

## 2021-04-12 DIAGNOSIS — E875 Hyperkalemia: Secondary | ICD-10-CM

## 2021-04-12 DIAGNOSIS — E118 Type 2 diabetes mellitus with unspecified complications: Secondary | ICD-10-CM | POA: Diagnosis not present

## 2021-04-12 DIAGNOSIS — M6281 Muscle weakness (generalized): Secondary | ICD-10-CM | POA: Diagnosis not present

## 2021-04-12 DIAGNOSIS — R2689 Other abnormalities of gait and mobility: Secondary | ICD-10-CM | POA: Diagnosis not present

## 2021-04-12 LAB — BASIC METABOLIC PANEL
BUN/Creatinine Ratio: 28 (ref 12–28)
BUN: 26 mg/dL (ref 8–27)
CO2: 38 mmol/L — ABNORMAL HIGH (ref 20–29)
Calcium: 9.8 mg/dL (ref 8.7–10.3)
Chloride: 95 mmol/L — ABNORMAL LOW (ref 96–106)
Creatinine, Ser: 0.93 mg/dL (ref 0.57–1.00)
Glucose: 129 mg/dL — ABNORMAL HIGH (ref 70–99)
Potassium: 5.6 mmol/L — ABNORMAL HIGH (ref 3.5–5.2)
Sodium: 137 mmol/L (ref 134–144)
eGFR: 62 mL/min/{1.73_m2} (ref >59)

## 2021-04-12 NOTE — Telephone Encounter (Signed)
-----   Message from Beatrice Lecher, [place] sent at 04/12/2021 12:28 PM EDT ----- K+ improved but still elevated. Creatinine ok.  PLAN:  -K+ and Spironolactone were DC'd yesterday.  Please make sure she remains off of them. -Continue furosemide 20 mg once daily  -Repeat BMET tomorrow AM (stat) Tereso Newcomer, PA-C    04/12/2021 12:26 PM

## 2021-04-12 NOTE — Telephone Encounter (Signed)
Results and plan of care reviewed with patient who verbalized understanding of medication instructions. She will go to [facility] location on 9/30 for repeat lab work. She thanked me for the call.

## 2021-04-13 DIAGNOSIS — Z79899 Other long term (current) drug therapy: Secondary | ICD-10-CM | POA: Diagnosis not present

## 2021-04-13 DIAGNOSIS — E875 Hyperkalemia: Secondary | ICD-10-CM | POA: Diagnosis not present

## 2021-04-13 NOTE — Addendum Note (Signed)
Addended by: Virl Axe, Omarian Jaquith L on: 04/13/2021 09:33 AM   Modules accepted: Orders

## 2021-04-14 LAB — BASIC METABOLIC PANEL
BUN/Creatinine Ratio: 29 — ABNORMAL HIGH (ref 12–28)
BUN: 23 mg/dL (ref 8–27)
CO2: 30 mmol/L — ABNORMAL HIGH (ref 20–29)
Calcium: 10.1 mg/dL (ref 8.7–10.3)
Chloride: 93 mmol/L — ABNORMAL LOW (ref 96–106)
Creatinine, Ser: 0.79 mg/dL (ref 0.57–1.00)
Glucose: 118 mg/dL — ABNORMAL HIGH (ref 70–99)
Potassium: 5.6 mmol/L — ABNORMAL HIGH (ref 3.5–5.2)
Sodium: 136 mmol/L (ref 134–144)
eGFR: 76 mL/min/{1.73_m2} (ref >59)

## 2021-04-16 ENCOUNTER — Other Ambulatory Visit: Payer: Self-pay | Admitting: *Deleted

## 2021-04-16 DIAGNOSIS — J45998 Other asthma: Secondary | ICD-10-CM | POA: Diagnosis not present

## 2021-04-16 DIAGNOSIS — R2689 Other abnormalities of gait and mobility: Secondary | ICD-10-CM | POA: Diagnosis not present

## 2021-04-16 DIAGNOSIS — M6281 Muscle weakness (generalized): Secondary | ICD-10-CM | POA: Diagnosis not present

## 2021-04-16 DIAGNOSIS — R0602 Shortness of breath: Secondary | ICD-10-CM | POA: Diagnosis not present

## 2021-04-16 DIAGNOSIS — E875 Hyperkalemia: Secondary | ICD-10-CM

## 2021-04-16 DIAGNOSIS — J449 Chronic obstructive pulmonary disease, unspecified: Secondary | ICD-10-CM | POA: Diagnosis not present

## 2021-04-17 DIAGNOSIS — M6281 Muscle weakness (generalized): Secondary | ICD-10-CM | POA: Diagnosis not present

## 2021-04-17 DIAGNOSIS — E875 Hyperkalemia: Secondary | ICD-10-CM | POA: Diagnosis not present

## 2021-04-17 LAB — BASIC METABOLIC PANEL
BUN/Creatinine Ratio: 27 (ref 12–28)
BUN: 22 mg/dL (ref 8–27)
CO2: 33 mmol/L — ABNORMAL HIGH (ref 20–29)
Calcium: 9.8 mg/dL (ref 8.7–10.3)
Chloride: 92 mmol/L — ABNORMAL LOW (ref 96–106)
Creatinine, Ser: 0.82 mg/dL (ref 0.57–1.00)
Glucose: 150 mg/dL — ABNORMAL HIGH (ref 70–99)
Potassium: 4.7 mmol/L (ref 3.5–5.2)
Sodium: 137 mmol/L (ref 134–144)
eGFR: 72 mL/min/{1.73_m2} (ref >59)

## 2021-04-18 DIAGNOSIS — M6281 Muscle weakness (generalized): Secondary | ICD-10-CM | POA: Diagnosis not present

## 2021-04-19 DIAGNOSIS — R0602 Shortness of breath: Secondary | ICD-10-CM | POA: Diagnosis not present

## 2021-04-19 DIAGNOSIS — R2689 Other abnormalities of gait and mobility: Secondary | ICD-10-CM | POA: Diagnosis not present

## 2021-04-19 DIAGNOSIS — M6281 Muscle weakness (generalized): Secondary | ICD-10-CM | POA: Diagnosis not present

## 2021-04-23 ENCOUNTER — Other Ambulatory Visit: Payer: Self-pay | Admitting: *Deleted

## 2021-04-23 DIAGNOSIS — E875 Hyperkalemia: Secondary | ICD-10-CM

## 2021-04-23 DIAGNOSIS — Z79899 Other long term (current) drug therapy: Secondary | ICD-10-CM

## 2021-04-24 DIAGNOSIS — R2689 Other abnormalities of gait and mobility: Secondary | ICD-10-CM | POA: Diagnosis not present

## 2021-04-24 DIAGNOSIS — R0602 Shortness of breath: Secondary | ICD-10-CM | POA: Diagnosis not present

## 2021-04-24 DIAGNOSIS — M6281 Muscle weakness (generalized): Secondary | ICD-10-CM | POA: Diagnosis not present

## 2021-04-24 NOTE — Telephone Encounter (Signed)
Take 1 extra dose of Furosemide 20 mg. Call if weight increases more despite taking extra Furosemide.  Tereso Newcomer, PA-C    04/24/2021 12:49 PM

## 2021-04-25 DIAGNOSIS — M6281 Muscle weakness (generalized): Secondary | ICD-10-CM | POA: Diagnosis not present

## 2021-04-25 LAB — BASIC METABOLIC PANEL

## 2021-04-26 DIAGNOSIS — M6281 Muscle weakness (generalized): Secondary | ICD-10-CM | POA: Diagnosis not present

## 2021-04-26 DIAGNOSIS — R2689 Other abnormalities of gait and mobility: Secondary | ICD-10-CM | POA: Diagnosis not present

## 2021-04-26 DIAGNOSIS — R0602 Shortness of breath: Secondary | ICD-10-CM | POA: Diagnosis not present

## 2021-04-27 DIAGNOSIS — M6281 Muscle weakness (generalized): Secondary | ICD-10-CM | POA: Diagnosis not present

## 2021-04-30 DIAGNOSIS — I5022 Chronic systolic (congestive) heart failure: Secondary | ICD-10-CM

## 2021-04-30 DIAGNOSIS — E118 Type 2 diabetes mellitus with unspecified complications: Secondary | ICD-10-CM

## 2021-04-30 DIAGNOSIS — M6281 Muscle weakness (generalized): Secondary | ICD-10-CM | POA: Diagnosis not present

## 2021-04-30 DIAGNOSIS — R0602 Shortness of breath: Secondary | ICD-10-CM | POA: Diagnosis not present

## 2021-04-30 DIAGNOSIS — R2689 Other abnormalities of gait and mobility: Secondary | ICD-10-CM | POA: Diagnosis not present

## 2021-04-30 DIAGNOSIS — Z79899 Other long term (current) drug therapy: Secondary | ICD-10-CM

## 2021-04-30 MED ORDER — FUROSEMIDE 40 MG PO TABS: 40.0000 mg | ORAL_TABLET | Freq: Every day | ORAL | 1 refills | Status: DC

## 2021-04-30 NOTE — Telephone Encounter (Signed)
Increase Lasix to 40 mg once daily. See if we can change her BMET from Wed to Fri this week since we are increasing her Lasix. Tereso Newcomer, PA-C    04/30/2021 12:43 PM

## 2021-05-01 DIAGNOSIS — M6281 Muscle weakness (generalized): Secondary | ICD-10-CM | POA: Diagnosis not present

## 2021-05-02 DIAGNOSIS — Z79899 Other long term (current) drug therapy: Secondary | ICD-10-CM | POA: Diagnosis not present

## 2021-05-02 DIAGNOSIS — E875 Hyperkalemia: Secondary | ICD-10-CM | POA: Diagnosis not present

## 2021-05-03 DIAGNOSIS — R2689 Other abnormalities of gait and mobility: Secondary | ICD-10-CM | POA: Diagnosis not present

## 2021-05-03 DIAGNOSIS — M6281 Muscle weakness (generalized): Secondary | ICD-10-CM | POA: Diagnosis not present

## 2021-05-03 DIAGNOSIS — I5022 Chronic systolic (congestive) heart failure: Secondary | ICD-10-CM

## 2021-05-03 DIAGNOSIS — R0602 Shortness of breath: Secondary | ICD-10-CM | POA: Diagnosis not present

## 2021-05-03 LAB — BASIC METABOLIC PANEL
BUN/Creatinine Ratio: 18 (ref 12–28)
BUN: 14 mg/dL (ref 8–27)
CO2: 35 mmol/L — ABNORMAL HIGH (ref 20–29)
Calcium: 9.5 mg/dL (ref 8.7–10.3)
Chloride: 92 mmol/L — ABNORMAL LOW (ref 96–106)
Creatinine, Ser: 0.78 mg/dL (ref 0.57–1.00)
Glucose: 136 mg/dL — ABNORMAL HIGH (ref 70–99)
Potassium: 4.4 mmol/L (ref 3.5–5.2)
Sodium: 137 mmol/L (ref 134–144)
eGFR: 77 mL/min/{1.73_m2} (ref >59)

## 2021-05-03 MED ORDER — FUROSEMIDE 20 MG PO TABS: 20.0000 mg | ORAL_TABLET | Freq: Every day | ORAL | 3 refills | Status: DC

## 2021-05-03 NOTE — Telephone Encounter (Signed)
Creatinine, K+ normal.   See MyChart message from 10/17.  Since Furosemide was increased, I asked for this lab to be done 10/21 but was collected on 10/19 anyway. PLAN:  -Continue current medications/treatment plan and follow up as scheduled.  -Repeat BMET 2 weeks  Tereso Newcomer, PA-C    05/03/2021 7:24 AM

## 2021-05-03 NOTE — Telephone Encounter (Signed)
Ok.  Notes from 10/17 indicate she was going to increase to 40 mg once daily. I was not aware she was only taking 20 mg once daily. Since the dose of Furosemide remains at 20 mg once daily, she does not need to repeat labs.   We can cancel the repeat BMET in 2 weeks. If she has to increase Furosemide to 40 mg > 3 days in a row, she should contact us. Tereso Newcomer, PA-C    05/03/2021 2:16 PM

## 2021-05-03 NOTE — Telephone Encounter (Signed)
Called patient back about her message. Patient stated she cannot take Lasix 40 mg by mouth daily. Patient requested to take Lasix 20 mg by mouth daily and take an extra as needed for weight gain. Patient is going to come in on 05/17/21 for lab work at [facility]. Patient would like to get additional lab work, so she is only stuck once. These are Hgb A1c, CMET (instead of BMET), CBC, and B12. Will forward to [facility] PA for okay to order.

## 2021-05-03 NOTE — Telephone Encounter (Signed)
Called patient back about Rebecca Newcomer PA's message. Patient verbalized understanding. Updated medication list and canceled BMET.

## 2021-05-04 DIAGNOSIS — M6281 Muscle weakness (generalized): Secondary | ICD-10-CM | POA: Diagnosis not present

## 2021-05-08 DIAGNOSIS — J449 Chronic obstructive pulmonary disease, unspecified: Secondary | ICD-10-CM | POA: Diagnosis not present

## 2021-05-08 DIAGNOSIS — J432 Centrilobular emphysema: Secondary | ICD-10-CM | POA: Diagnosis not present

## 2021-05-08 DIAGNOSIS — R2689 Other abnormalities of gait and mobility: Secondary | ICD-10-CM | POA: Diagnosis not present

## 2021-05-08 DIAGNOSIS — R0602 Shortness of breath: Secondary | ICD-10-CM | POA: Diagnosis not present

## 2021-05-08 DIAGNOSIS — M6281 Muscle weakness (generalized): Secondary | ICD-10-CM | POA: Diagnosis not present

## 2021-05-08 DIAGNOSIS — J9611 Chronic respiratory failure with hypoxia: Secondary | ICD-10-CM | POA: Diagnosis not present

## 2021-05-11 DIAGNOSIS — M6281 Muscle weakness (generalized): Secondary | ICD-10-CM | POA: Diagnosis not present

## 2021-05-11 DIAGNOSIS — R2689 Other abnormalities of gait and mobility: Secondary | ICD-10-CM | POA: Diagnosis not present

## 2021-05-11 DIAGNOSIS — R0602 Shortness of breath: Secondary | ICD-10-CM | POA: Diagnosis not present

## 2021-05-14 DIAGNOSIS — M6281 Muscle weakness (generalized): Secondary | ICD-10-CM | POA: Diagnosis not present

## 2021-05-14 DIAGNOSIS — R2689 Other abnormalities of gait and mobility: Secondary | ICD-10-CM | POA: Diagnosis not present

## 2021-05-14 DIAGNOSIS — R0602 Shortness of breath: Secondary | ICD-10-CM | POA: Diagnosis not present

## 2021-05-15 DIAGNOSIS — J449 Chronic obstructive pulmonary disease, unspecified: Secondary | ICD-10-CM | POA: Diagnosis not present

## 2021-05-15 DIAGNOSIS — M6281 Muscle weakness (generalized): Secondary | ICD-10-CM | POA: Diagnosis not present

## 2021-05-16 DIAGNOSIS — M6281 Muscle weakness (generalized): Secondary | ICD-10-CM | POA: Diagnosis not present

## 2021-05-18 DIAGNOSIS — M6281 Muscle weakness (generalized): Secondary | ICD-10-CM | POA: Diagnosis not present

## 2021-05-21 DIAGNOSIS — R0602 Shortness of breath: Secondary | ICD-10-CM | POA: Diagnosis not present

## 2021-05-21 DIAGNOSIS — M6281 Muscle weakness (generalized): Secondary | ICD-10-CM | POA: Diagnosis not present

## 2021-05-21 DIAGNOSIS — R2689 Other abnormalities of gait and mobility: Secondary | ICD-10-CM | POA: Diagnosis not present

## 2021-05-22 ENCOUNTER — Ambulatory Visit: Payer: Medicare HMO | Admitting: Adult Health

## 2021-05-22 ENCOUNTER — Other Ambulatory Visit: Payer: Self-pay

## 2021-05-22 ENCOUNTER — Encounter: Payer: Self-pay | Admitting: Adult Health

## 2021-05-22 DIAGNOSIS — I428 Other cardiomyopathies: Secondary | ICD-10-CM | POA: Diagnosis not present

## 2021-05-22 DIAGNOSIS — R5381 Other malaise: Secondary | ICD-10-CM | POA: Diagnosis not present

## 2021-05-22 DIAGNOSIS — J441 Chronic obstructive pulmonary disease with (acute) exacerbation: Secondary | ICD-10-CM

## 2021-05-22 DIAGNOSIS — J9611 Chronic respiratory failure with hypoxia: Secondary | ICD-10-CM

## 2021-05-22 MED ORDER — PREDNISONE 10 MG PO TABS: ORAL_TABLET | ORAL | 0 refills | Status: DC

## 2021-05-22 NOTE — Assessment & Plan Note (Signed)
Activity as tolerated

## 2021-05-22 NOTE — Assessment & Plan Note (Signed)
Appears euvolemic on exam.  Continue follow-up with cardiology 

## 2021-05-22 NOTE — Patient Instructions (Signed)
Begin Prednisone 20mg  daily for 5 days, then 10mg  daily for 5 days and 5mg  daily for 5 days and stop .  Continue on Budesonide Neb Twice daily  .  Continue on Brovana Twice daily  .  Continue on Albuterol and Ipratropium Neb four times a day   Activity as tolerated Saline nasal spray Twice daily   Saline nasal gel At bedtime   Incentive spirometry daily  Robitussin Cough syrup As needed  cough/congestion  Continue on oxygen 4l/m rest and 5l/m activity . O2 sats goal >88-90%.  Follow-up with Dr. or Jonthan Leite NP in 2- weeks  and As needed  Please contact office for sooner follow up if symptoms do not improve or worsen or seek emergency care

## 2021-05-22 NOTE — Assessment & Plan Note (Signed)
Continue on Oxygen to maintain O2 sats 88-90%.   Plan  Patient Instructions  Begin Prednisone 20mg  daily for 5 days, then 10mg  daily for 5 days and 5mg  daily for 5 days and stop .  Continue on Budesonide Neb Twice daily  .  Continue on Brovana Twice daily  .  Continue on Albuterol and Ipratropium Neb four times a day   Activity as tolerated Saline nasal spray Twice daily   Saline nasal gel At bedtime   Incentive spirometry daily  Robitussin Cough syrup As needed  cough/congestion  Continue on oxygen 4l/m rest and 5l/m activity . O2 sats goal >88-90%.  Follow-up with Dr. or Sankalp Ferrell NP in 2- weeks  and As needed  Please contact office for sooner follow up if symptoms do not improve or worsen or seek emergency care

## 2021-05-22 NOTE — Progress Notes (Signed)
@Patient  ID: Rebecca [name], female    DOB: 05/17/1941, 80 y.o.   MRN: [id number]  Chief Complaint  Patient presents with   Follow-up     Referring provider: London Pepper, MD  HPI: 80 yo female former smoker followed for severe COPD and Chronic Respiratory Failure on oxygen since 2010 and lung nodules.  Medical history significant for Nonischemic cardiomyopathy, DM, and HTN  Lives in independent living on [address]   TEST/EVENTS :  Spirometry 01/2018 showed severe airway obstruction with ratio 43, FEV1 of 0.57-31% and FVC of 53%.   CT angio 06/18/18 LUL nodule 27mm   CT chest on February 16, 2019 that showed a stable 1.2 cm left thyroid nodule.  Severe emphysema.  Irregular 6 mm left upper lobe pulmonary nodule-stable and stable right upper lobe 5 mm pulmonary nodule.    Overnight oximetry test September 2021 showed no desaturations on 5 L of oxygen.   CT chest 09/2019 -No CT evidence of pulmonary embolism. 2. Marked severity emphysematous lung disease.(no mention of lung nodules )    CT chest January 12, 2020 negative for PE.  Small sliding hiatal hernia.  Emphysematous changes.  Basilar atelectasis. (no mention of lung nodules )  Chest x-ray February 21, 2020 interstitial thickening of the lung bases and emphysema in the upper lobes.   Hospitalization June 2021 for COPD exacerbation and hypercarbic hypoxic respiratory failure required BiPAP support.  05/22/2021 Follow up : COPD and Oxygen dependent Respiratory Failure  Patient returns for 16-month follow-up.  She has underlying severe COPD with emphysema and oxygen dependent respiratory failure.  She is maintained on budesonide and Brovana nebulizer twice daily and DuoNeb 3 times daily.  Patient has been on oxygen 4-6 l/m last visit patient was recommended to decrease oxygen down to 2 L at rest and 3 L with activity..  Last visit in office evaluation showed oxygen demands were decreased however patient says she was not able to do  this it made her breathing much worse.  She has now back up to 4 L at rest and 5 L with activity. Patient does live at independent living at [place] [address].  Patient says that she gets short of breath with minimum activity.  She does not understand why she is so short of breath but her oxygen levels remain in the 90s.  Patient does have intermittent chest tightness.  She does complain over the last year breathing seems to be slowly getting worse.  She gets short of breath at rest sometimes.  She has been followed by cardiology she has nonischemic cardiomyopathy.  Echo in February 2022 showed EF at 40 to 45% and grade 1 diastolic dysfunction.  Patient denies any orthopnea or increased leg swelling. She has been recommended for nocturnal BiPAP due to chronic hypercarbia and frequent COPD exacerbations.  She has declined this in the past. Patient says over the last several weeks she has had some intermittent episodes of wheezing. She denies any fever, hemoptysis, chest pain, orthopnea.  Chest x-ray last visit showed chronic COPD changes. Patient has significant symptom burden which appears to be progressively worsening.  Have discussed palliative care with her and her daughter today.  She wants to think about it and get back to [place].   Allergies  Allergen Reactions   Gabapentin Other (See Comments)    Affects breathing   Pravastatin Sodium Other (See Comments)    See coments   Rosuvastatin Calcium Other (See Comments)   Simvastatin Other (See Comments)  Amoxicillin Rash   Lisinopril Rash   Sulfacetamide Rash   Tetracyclines & Related Rash    Immunization History  Administered Date(s) Administered   Fluad Quad(high Dose 65+) 04/07/2020   Influenza Split 04/14/2018, 03/16/2019, 04/07/2019   Influenza, High Dose Seasonal PF 04/14/2018, 05/01/2019   Moderna SARS-COV2 Booster Vaccination 02/10/2021   Moderna Sars-Covid-2 Vaccination 08/16/2019, 09/13/2019, 05/22/2020   Pneumococcal Conjugate-13  11/15/2016, 02/05/2018   Tdap 07/27/2018   Zoster Recombinat (Shingrix) 07/14/2018, 09/14/2018   Zoster, Live 07/14/2018, 09/14/2018    Past Medical History:  Diagnosis Date   Congestive heart disease (HCC)    COPD (chronic obstructive pulmonary disease) (HCC)    Diabetes (HCC)    Hyperlipemia    Osteoporosis    Primary hypertension     Tobacco History: Social History   Tobacco Use  Smoking Status Former   Packs/day: 1.50   Years: 40.00   Pack years: 60.00   Types: Cigarettes   Quit date: 2003   Years since quitting: 19.8  Smokeless Tobacco Never   Counseling given: Not Answered   Outpatient Medications Prior to Visit  Medication Sig Dispense Refill   acetaminophen (TYLENOL) 500 MG tablet Take 1,000 mg by mouth 2 (two) times daily as needed for mild pain or headache.     albuterol (PROVENTIL) (2.5 MG/3ML) 0.083% nebulizer solution INHALE 1 VIAL BY NEBULIZATION EVERY DAY IN THE MORNING, AT NOON, AND AT BEDTIME. USE AS DIRECTED. (Patient taking differently: in the morning, at noon, in the evening, and at bedtime.) 225 mL 4   albuterol (VENTOLIN HFA) 108 (90 Base) MCG/ACT inhaler Inhale 1-2 puffs into the lungs every 6 (six) hours as needed for wheezing or shortness of breath. 8 g 5   brimonidine (ALPHAGAN) 0.2 % ophthalmic solution Place 1 drop into the left eye 2 (two) times daily.     BROVANA 15 MCG/2ML NEBU USE 1 VIAL  IN  NEBULIZER TWICE  DAILY - morning and evening 2 mL 11   budesonide (PULMICORT) 0.5 MG/2ML nebulizer solution USE 1 VIAL  IN  NEBULIZER TWICE  DAILY - Rinse mouth after treatment 120 mL 11   Calcium Carbonate-Vitamin D 600-200 MG-UNIT TABS Take 1 tablet by mouth daily.      carboxymethylcellulose (REFRESH PLUS) 0.5 % SOLN 1 drop 3 (three) times daily as needed.     carvedilol (COREG) 25 MG tablet Take 25 mg by mouth 2 (two) times daily.      estradiol (ESTRACE) 0.1 MG/GM vaginal cream Place 1 Applicatorful vaginally every other day.      furosemide  (LASIX) 20 MG tablet Take 1 tablet (20 mg total) by mouth daily. Take extra 1 tablet by mouth daily as needed for edema. 100 tablet 3   ipratropium (ATROVENT) 0.02 % nebulizer solution Take 0.5 mg by nebulization 4 (four) times daily.     losartan (COZAAR) 50 MG tablet Take 1 tablet (50 mg total) by mouth daily. (Patient taking differently: Take 50 mg by mouth in the morning, at noon, in the evening, and at bedtime. Pt takes 4 times daily) 90 tablet 0   metFORMIN (GLUCOPHAGE) 500 MG tablet Take 500 mg by mouth daily with supper.      vitamin B-12 (CYANOCOBALAMIN) 1000 MCG tablet Take 1,000 mcg by mouth daily.     No facility-administered medications prior to visit.     Review of Systems:   Constitutional:   No  weight loss, night sweats,  Fevers, chills,  +fatigue, or  lassitude.  HEENT:  No headaches,  Difficulty swallowing,  Tooth/dental problems, or  Sore throat,                No sneezing, itching, ear ache, nasal congestion, post nasal drip,   CV:  No chest pain,  Orthopnea, PND, swelling in lower extremities, anasarca, dizziness, palpitations, syncope.   GI  No heartburn, indigestion, abdominal pain, nausea, vomiting, diarrhea, change in bowel habits, loss of appetite, bloody stools.   Resp: .  No chest wall deformity  Skin: no rash or lesions.  GU: no dysuria, change in color of urine, no urgency or frequency.  No flank pain, no hematuria   MS:  No joint pain or swelling.  No decreased range of motion.  No back pain.    Physical Exam  BP 128/70 (BP Location: Left Arm, Patient Position: Sitting, Cuff Size: Normal)   Pulse 83   Temp 98.2 F (36.8 C) (Oral)   Ht 5\' 2"  (1.575 m)   Wt 158 lb 12.8 oz (72 kg)   SpO2 98%   BMI 29.04 kg/m   GEN: A/Ox3; pleasant , NAD, elderly, on oxygen, rolling walker   HEENT:  [place]/AT,   NOSE-clear, THROAT-clear, no lesions, no postnasal drip or exudate noted.   NECK:  Supple w/ fair ROM; no JVD; normal carotid impulses w/o bruits; no  thyromegaly or nodules palpated; no lymphadenopathy.    RESP decreased breath sounds in the bases no accessory muscle use, no dullness to percussion  CARD:  RRR, no m/r/g, tr-1 + peripheral edema, pulses intact, no cyanosis or clubbing.  GI:   Soft & nt; nml bowel sounds; no organomegaly or masses detected.   Musco: Warm bil, no deformities or joint swelling noted.   Neuro: alert, no focal deficits noted.    Skin: Warm, no lesions or rashes    Lab Results:   BMET     ProBNP   Imaging: No results found.    No flowsheet data found.  No results found for: NITRICOXIDE      Assessment & Plan:   Chronic obstructive pulmonary disease with acute exacerbation ([place]) COPD with suspected progressive decline.  May be having a mild exacerbation.  We will try low-dose slow prednisone taper over the next 2 weeks. Increase DuoNeb up to 4 times daily.  Continue on budesonide Brovana twice daily.   Plan  Patient Instructions  Begin Prednisone 20mg  daily for 5 days, then 10mg  daily for 5 days and 5mg  daily for 5 days and stop .  Continue on Budesonide Neb Twice daily  .  Continue on Brovana Twice daily  .  Continue on Albuterol and Ipratropium Neb four times a day   Activity as tolerated Saline nasal spray Twice daily   Saline nasal gel At bedtime   Incentive spirometry daily  Robitussin Cough syrup As needed  cough/congestion  Continue on oxygen 4l/m rest and 5l/m activity . O2 sats goal >88-90%.  Follow-up with Dr. Elsworth Soho or Aolanis Crispen NP in 2- weeks  and As needed  Please contact office for sooner follow up if symptoms do not improve or worsen or seek emergency care       Chronic respiratory failure with hypoxia ([place]) Continue on Oxygen to maintain O2 sats 88-90%.   Plan  Patient Instructions  Begin Prednisone 20mg  daily for 5 days, then 10mg  daily for 5 days and 5mg  daily for 5 days and stop .  Continue on Budesonide Neb Twice daily  .  Continue on Brovana Twice  daily  .  Continue on Albuterol and Ipratropium Neb four times a day   Activity as tolerated Saline nasal spray Twice daily   Saline nasal gel At bedtime   Incentive spirometry daily  Robitussin Cough syrup As needed  cough/congestion  Continue on oxygen 4l/m rest and 5l/m activity . O2 sats goal >88-90%.  Follow-up with Dr. Elsworth Soho or Liahna Brickner NP in 2- weeks  and As needed  Please contact office for sooner follow up if symptoms do not improve or worsen or seek emergency care       Nonischemic cardiomyopathy ([place]) Appears euvolemic on exam.  Continue follow-up with cardiology  Physical deconditioning Activity as tolerated     Rexene Edison, NP 05/22/2021

## 2021-05-22 NOTE — Assessment & Plan Note (Signed)
COPD with suspected progressive decline.  May be having a mild exacerbation.  We will try low-dose slow prednisone taper over the next 2 weeks. Increase DuoNeb up to 4 times daily.  Continue on budesonide Brovana twice daily.   Plan  Patient Instructions  Begin Prednisone 20mg  daily for 5 days, then 10mg  daily for 5 days and 5mg  daily for 5 days and stop .  Continue on Budesonide Neb Twice daily  .  Continue on Brovana Twice daily  .  Continue on Albuterol and Ipratropium Neb four times a day   Activity as tolerated Saline nasal spray Twice daily   Saline nasal gel At bedtime   Incentive spirometry daily  Robitussin Cough syrup As needed  cough/congestion  Continue on oxygen 4l/m rest and 5l/m activity . O2 sats goal >88-90%.  Follow-up with Dr. or Timiyah Romito NP in 2- weeks  and As needed  Please contact office for sooner follow up if symptoms do not improve or worsen or seek emergency care

## 2021-05-23 DIAGNOSIS — J441 Chronic obstructive pulmonary disease with (acute) exacerbation: Secondary | ICD-10-CM

## 2021-05-23 DIAGNOSIS — J9611 Chronic respiratory failure with hypoxia: Secondary | ICD-10-CM

## 2021-05-23 DIAGNOSIS — M6281 Muscle weakness (generalized): Secondary | ICD-10-CM | POA: Diagnosis not present

## 2021-05-23 NOTE — Telephone Encounter (Signed)
S/w pt [facility] is closer to pt's house.  Pt does not drive.  Scheduled pt to see Gillian Shields, NP at [facility] to set up Cath.

## 2021-05-24 NOTE — Telephone Encounter (Signed)
Okay please send referral for palliative care

## 2021-05-24 NOTE — Telephone Encounter (Signed)
TP please see my chart message from the pt.  thanks

## 2021-05-25 DIAGNOSIS — M6281 Muscle weakness (generalized): Secondary | ICD-10-CM | POA: Diagnosis not present

## 2021-05-29 DIAGNOSIS — R0602 Shortness of breath: Secondary | ICD-10-CM | POA: Diagnosis not present

## 2021-05-29 DIAGNOSIS — R2689 Other abnormalities of gait and mobility: Secondary | ICD-10-CM | POA: Diagnosis not present

## 2021-05-29 DIAGNOSIS — M6281 Muscle weakness (generalized): Secondary | ICD-10-CM | POA: Diagnosis not present

## 2021-05-30 DIAGNOSIS — M6281 Muscle weakness (generalized): Secondary | ICD-10-CM | POA: Diagnosis not present

## 2021-05-31 DIAGNOSIS — R2689 Other abnormalities of gait and mobility: Secondary | ICD-10-CM | POA: Diagnosis not present

## 2021-05-31 DIAGNOSIS — R0602 Shortness of breath: Secondary | ICD-10-CM | POA: Diagnosis not present

## 2021-05-31 DIAGNOSIS — M6281 Muscle weakness (generalized): Secondary | ICD-10-CM | POA: Diagnosis not present

## 2021-05-31 MED ORDER — LOSARTAN POTASSIUM 50 MG PO TABS: 50.0000 mg | ORAL_TABLET | Freq: Every day | ORAL | 3 refills | Status: DC

## 2021-05-31 NOTE — Telephone Encounter (Signed)
Called patient to make sure she is taking Losartan 50 mg by mouth daily. Patient recently saw Tereso Newcomer PA in September. Per Tereso Newcomer PA, patient is to continue Losartan. Sent in 90 day supply to [facility] at patient's request.

## 2021-06-01 DIAGNOSIS — M6281 Muscle weakness (generalized): Secondary | ICD-10-CM | POA: Diagnosis not present

## 2021-06-01 MED ORDER — ALBUTEROL SULFATE (2.5 MG/3ML) 0.083% IN NEBU: 2.5000 mg | INHALATION_SOLUTION | Freq: Four times a day (QID) | RESPIRATORY_TRACT | 5 refills | Status: DC

## 2021-06-01 MED ORDER — IPRATROPIUM BROMIDE 0.02 % IN SOLN: 0.5000 mg | Freq: Four times a day (QID) | RESPIRATORY_TRACT | 5 refills | Status: DC

## 2021-06-01 NOTE — Telephone Encounter (Signed)
Please update MAR to indicate albuterol and ipratropium nebulizer 4 times daily and place in refills

## 2021-06-03 DIAGNOSIS — R0602 Shortness of breath: Secondary | ICD-10-CM | POA: Diagnosis not present

## 2021-06-03 DIAGNOSIS — R2689 Other abnormalities of gait and mobility: Secondary | ICD-10-CM | POA: Diagnosis not present

## 2021-06-03 DIAGNOSIS — M6281 Muscle weakness (generalized): Secondary | ICD-10-CM | POA: Diagnosis not present

## 2021-06-04 DIAGNOSIS — M6281 Muscle weakness (generalized): Secondary | ICD-10-CM | POA: Diagnosis not present

## 2021-06-05 ENCOUNTER — Telehealth: Payer: Self-pay | Admitting: *Deleted

## 2021-06-05 ENCOUNTER — Encounter: Payer: Self-pay | Admitting: Adult Health

## 2021-06-05 ENCOUNTER — Ambulatory Visit (HOSPITAL_BASED_OUTPATIENT_CLINIC_OR_DEPARTMENT_OTHER): Payer: Medicare HMO | Admitting: Family

## 2021-06-05 ENCOUNTER — Ambulatory Visit: Payer: Medicare HMO | Admitting: Adult Health

## 2021-06-05 ENCOUNTER — Other Ambulatory Visit: Payer: Self-pay

## 2021-06-05 VITALS — BP 152/84 | HR 86 | Ht 62.0 in | Wt 161.0 lb

## 2021-06-05 VITALS — BP 124/68 | HR 85 | Temp 98.2°F | Ht 62.0 in | Wt 158.8 lb

## 2021-06-05 DIAGNOSIS — R0609 Other forms of dyspnea: Secondary | ICD-10-CM

## 2021-06-05 DIAGNOSIS — I5022 Chronic systolic (congestive) heart failure: Secondary | ICD-10-CM

## 2021-06-05 DIAGNOSIS — J449 Chronic obstructive pulmonary disease, unspecified: Secondary | ICD-10-CM

## 2021-06-05 DIAGNOSIS — R5381 Other malaise: Secondary | ICD-10-CM

## 2021-06-05 DIAGNOSIS — I428 Other cardiomyopathies: Secondary | ICD-10-CM | POA: Diagnosis not present

## 2021-06-05 DIAGNOSIS — I447 Left bundle-branch block, unspecified: Secondary | ICD-10-CM

## 2021-06-05 DIAGNOSIS — I2510 Atherosclerotic heart disease of native coronary artery without angina pectoris: Secondary | ICD-10-CM

## 2021-06-05 DIAGNOSIS — I2584 Coronary atherosclerosis due to calcified coronary lesion: Secondary | ICD-10-CM | POA: Diagnosis not present

## 2021-06-05 DIAGNOSIS — Z515 Encounter for palliative care: Secondary | ICD-10-CM

## 2021-06-05 DIAGNOSIS — J9611 Chronic respiratory failure with hypoxia: Secondary | ICD-10-CM

## 2021-06-05 DIAGNOSIS — J441 Chronic obstructive pulmonary disease with (acute) exacerbation: Secondary | ICD-10-CM

## 2021-06-05 LAB — CBC WITH DIFFERENTIAL/PLATELET
Basophils Absolute: 0 10*3/uL (ref 0.0–0.1)
Basophils Relative: 0.4 % (ref 0.0–3.0)
Eosinophils Absolute: 0 10*3/uL (ref 0.0–0.7)
Eosinophils Relative: 0.4 % (ref 0.0–5.0)
HCT: 34.2 % — ABNORMAL LOW (ref 36.0–46.0)
Hemoglobin: 11.1 g/dL — ABNORMAL LOW (ref 12.0–15.0)
Lymphocytes Relative: 9.3 % — ABNORMAL LOW (ref 12.0–46.0)
Lymphs Abs: 0.9 10*3/uL (ref 0.7–4.0)
MCHC: 32.5 g/dL (ref 30.0–36.0)
MCV: 98.2 fl (ref 78.0–100.0)
Monocytes Absolute: 0.6 10*3/uL (ref 0.1–1.0)
Monocytes Relative: 6.2 % (ref 3.0–12.0)
Neutro Abs: 7.8 10*3/uL — ABNORMAL HIGH (ref 1.4–7.7)
Neutrophils Relative %: 83.7 % — ABNORMAL HIGH (ref 43.0–77.0)
Platelets: 226 10*3/uL (ref 150.0–400.0)
RBC: 3.48 Mil/uL — ABNORMAL LOW (ref 3.87–5.11)
RDW: 14.2 % (ref 11.5–15.5)
WBC: 9.3 10*3/uL (ref 4.0–10.5)

## 2021-06-05 LAB — BASIC METABOLIC PANEL
BUN: 16 mg/dL (ref 6–23)
CO2: 46 mEq/L — ABNORMAL HIGH (ref 19–32)
Calcium: 9.6 mg/dL (ref 8.4–10.5)
Chloride: 90 mEq/L — ABNORMAL LOW (ref 96–112)
Creatinine, Ser: 0.74 mg/dL (ref 0.40–1.20)
GFR: 76.33 mL/min (ref >60.00)
Glucose, Bld: 133 mg/dL — ABNORMAL HIGH (ref 70–99)
Potassium: 4.7 mEq/L (ref 3.5–5.1)
Sodium: 139 mEq/L (ref 135–145)

## 2021-06-05 LAB — D-DIMER, QUANTITATIVE: D-Dimer, Quant: 0.52 mcg/mL FEU — ABNORMAL HIGH (ref <0.50)

## 2021-06-05 MED ORDER — PREDNISONE 10 MG PO TABS: ORAL_TABLET | ORAL | 1 refills | Status: DC

## 2021-06-05 MED ORDER — ALBUTEROL SULFATE (2.5 MG/3ML) 0.083% IN NEBU
2.5000 mg | INHALATION_SOLUTION | Freq: Four times a day (QID) | RESPIRATORY_TRACT | 5 refills | Status: DC
Start: 2021-06-05 — End: 2021-06-15

## 2021-06-05 NOTE — Patient Instructions (Addendum)
Increase Prednisone 20mg  daily for 3 days then 10mg  daily and hold at this dose until seen back.  Labs today .  Continue on Budesonide Neb Twice daily  .  Continue on Brovana Twice daily  .  Continue on Albuterol and Ipratropium Neb four times a day   Activity as tolerated Saline nasal spray Twice daily   Saline nasal gel At bedtime   Incentive spirometry daily  Robitussin Cough syrup As needed  cough/congestion  Continue on oxygen 4l/m rest and 5l/m activity . O2 sats goal >88-90%.  Follow-up with Dr. or Alegandro Macnaughton NP in 4- weeks  and As needed  Please contact office for sooner follow up if symptoms do not improve or worsen or seek emergency care

## 2021-06-05 NOTE — H&P (View-Only) (Signed)
Office Visit    Patient Name: Rebecca [name] Date of Encounter: 06/05/2021  PCP:  Farris Has, MD   [facility]  Cardiologist:  Meriam Sprague, MD  Advanced Practice Provider:  No care team member to display Electrophysiologist:  None   Chief Complaint    Rebecca [name] is a 80 y.o. 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  Allergies  Allergies  Allergen Reactions   Gabapentin Other (See Comments)    Affects breathing   Pravastatin Sodium Other (See Comments)    See coments   Rosuvastatin Calcium Other (See Comments)   Simvastatin Other (See Comments)   Amoxicillin Rash   Lisinopril Rash   Sulfacetamide Rash   Tetracyclines & Related Rash    History of Present Illness    Rebecca [name] is a 80 y.o. 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  Myoview 07/2020 with LVEF 46%, septal movement consistent with a left bundle branch block, no new ischemia.  Subsequent echo 08/2020 LVEF 40 to 45% with anterior septal and inferior septal hypokinesis, grade 1 diastolic dysfunction.  She was last seen 04/10/2021 by Tereso Newcomer.  She continues to prefer initial medical management the cardiac catheterization was discussed in detail.  She has since sent to MyChart message that she is interested in cardiac  catheterization.  She presents today for follow-up.  She was seen earlier today by pulmonology and was started on prednisone.  She notes her dyspnea on exertion with minimal activity is progressed despite 3 L of home oxygen.  Reports occasional chest discomfort with her exertional dyspnea.  The chest pain is midsternal and resolves by rest.  No edema, orthopnea, PND.  Endorses adhering to low-sodium diet and fluid restriction.  She is interested in pursuing cardiac catheterization for further diagnostic benefit.   EKGs/Labs/Other Studies Reviewed:   The following studies were reviewed today:  Echocardiogram 08/18/2020 EF 40-45, anteroseptal and inferoseptal HK, GR 1 DD, normal RVSF   GATED SPECT MYO PERF W/LEXISCAN STRESS 1D 08/07/2020 Narrative 1. There are reduced counts in the septal segments on rest imaging that improve on stress imaging with normal wall motion consistent with left bundle branch block artifact. No evidence of ischemia or infarction. 2. Mildly reduced LVEF, 46%. Septal movement consistent with LBBB. 3. This is an low- to intermediate-risk study.   Chest CTA 01/12/2020 IMPRESSION: 1. No definite evidence of pulmonary embolus. 2. Coronary artery calcifications are noted suggesting coronary artery disease. 3. Small sliding-type hiatal hernia. 4. Bilateral nephrolithiasis.   Aortic Atherosclerosis (ICD10-I70.0) and Emphysema (ICD10-J43.9).  EKG:  EKG is  ordered today.  The ekg ordered today demonstrates NSR 87 bpm with PAC and LBBB.  Recent Labs: 04/10/2021: NT-Pro BNP 179 06/05/2021: BUN 16; Creatinine, Ser 0.74; Hemoglobin 11.1; Platelets 226.0; Potassium 4.7;  Sodium 139  Recent Lipid Panel No results found for: CHOL, TRIG, HDL, CHOLHDL, VLDL, LDLCALC, LDLDIRECT   Home Medications   Current Meds  Medication Sig   acetaminophen (TYLENOL) 500 MG tablet Take 1,000 mg by mouth 2 (two) times daily as needed for mild pain or headache.   albuterol (PROVENTIL) (2.5  MG/3ML) 0.083% nebulizer solution Take 3 mLs (2.5 mg total) by nebulization in the morning, at noon, in the evening, and at bedtime.   albuterol (VENTOLIN HFA) 108 (90 Base) MCG/ACT inhaler Inhale 1-2 puffs into the lungs every 6 (six) hours as needed for wheezing or shortness of breath.   brimonidine (ALPHAGAN) 0.2 % ophthalmic solution Place 1 drop into the left eye 2 (two) times daily.   BROVANA 15 MCG/2ML NEBU USE 1 VIAL  IN  NEBULIZER TWICE  DAILY - morning and evening   budesonide (PULMICORT) 0.5 MG/2ML nebulizer solution USE 1 VIAL  IN  NEBULIZER TWICE  DAILY - Rinse mouth after treatment   Calcium Carbonate-Vitamin D 600-200 MG-UNIT TABS Take 1 tablet by mouth daily.    carboxymethylcellulose (REFRESH PLUS) 0.5 % SOLN 1 drop 3 (three) times daily as needed.   carvedilol (COREG) 25 MG tablet Take 25 mg by mouth 2 (two) times daily.    estradiol (ESTRACE) 0.1 MG/GM vaginal cream Place 1 Applicatorful vaginally every other day.    furosemide (LASIX) 20 MG tablet Take 1 tablet (20 mg total) by mouth daily. Take extra 1 tablet by mouth daily as needed for edema.   ipratropium (ATROVENT) 0.02 % nebulizer solution Take 2.5 mLs (0.5 mg total) by nebulization 4 (four) times daily.   losartan (COZAAR) 50 MG tablet Take 1 tablet (50 mg total) by mouth daily.   metFORMIN (GLUCOPHAGE) 500 MG tablet Take 500 mg by mouth daily with supper.    predniSONE (DELTASONE) 10 MG tablet 2 daily for 3 days then 1 tablet daily   vitamin B-12 (CYANOCOBALAMIN) 1000 MCG tablet Take 1,000 mcg by mouth daily.     Review of Systems      All other systems reviewed and are otherwise negative except as noted above.  Physical Exam    VS:  BP (!) 152/84 (BP Location: Right Arm, Patient Position: Sitting, Cuff Size: Normal)   Pulse 86   Ht 5\' 2"  (1.575 m)   Wt 161 lb (73 kg)   SpO2 91%   BMI 29.45 kg/m  , BMI Body mass index is 29.45 kg/m.  Wt Readings from Last 3 Encounters:  06/05/21 161 lb (73 kg)  06/05/21  158 lb 12.8 oz (72 kg)  05/22/21 158 lb 12.8 oz (72 kg)     GEN: Well nourished, well developed, in no acute distress. HEENT: normal. Neck: Supple, no JVD, carotid bruits, or masses. Cardiac: RRR, no murmurs, rubs, or gallops. No clubbing, cyanosis, edema.  Radials/PT 2+ and equal bilaterally.  Respiratory:  Respirations regular and unlabored, lung sounds diminished bilaterally. On 3L home O2. GI: Soft, nontender, nondistended. MS: No deformity or atrophy. Skin: Warm and dry, no rash. Neuro:  Strength and sensation are intact. Psych: Normal affect.  Assessment & Plan    HFmrEF - Euvolemic on exam though with progressive exertional dyspnea. No edema, orthopnea, PND. Marland Kitchen Continue Lasix at current dose. Given progressive dyspnea and reduced LVEF by echo 08/2020 plan for [facility]. No SGLT2i due to frequent UTI. GDMT includes Lasix, Spironolactone, Losartan, Carvediol.   Coronary artery calcification by CT / DOE  -progressive dyspnea on exertion and  chest discomfort concerning for anginal equivalent.  Plan for cardiac catheterization.  Shared Decision Making/Informed Consent{ The risks [stroke (1 in 1000), death (1 in 1000), kidney failure [usually temporary] (1 in 500), bleeding (1 in 200), allergic reaction [possibly serious] (1 in 200)], benefits (diagnostic support and management of coronary artery disease) and alternatives of a cardiac catheterization were discussed in detail with Ms. Denzler and she is willing to proceed.   COPD- follows with pulmonology was recently restarted on Prednisone.On 3L home O2.    HTN - BP well controlled. Continue current antihypertensive regimen of Coreg 25mg  BID, Spironolactone 12.5mg  QD, Losartan 50mg  QD.   HLD, LDL goal <70 - Previous intolerance to statin. Will require further medication optimization post cath. Consider PCSK9i vs Zeta at follow up.   LBBB - Stable finding by EKG today. No lightheadedness, dizziness. Continue to monitor with periodic EKG.    Disposition: Follow up in 3 week(s) with Freada Bergeron, MD or APP.  Signed, Loel Dubonnet, NP 06/05/2021, 2:15 PM [facility]

## 2021-06-05 NOTE — Progress Notes (Signed)
@Patient  ID: Rebecca [name], female    DOB: 18-Oct-1940, 80 y.o.   MRN: [id number]  Chief Complaint  Patient presents with   Follow-up    Referring provider: London Pepper, MD  HPI: 80 year old female former smoker followed for severe COPD and chronic respiratory failure on oxygen since 2010 and lung nodules (stable on serial CT) Medical history significant for nonischemic cardiomyopathy, diabetes and hypertension Lives in independent living at [place] [facility]  TEST/EVENTS :  Spirometry 01/2018 showed severe airway obstruction with ratio 43, FEV1 of 0.57-31% and FVC of 53%.   CT angio 06/18/18 LUL nodule 62mm   CT chest on February 16, 2019 that showed a stable 1.2 cm left thyroid nodule.  Severe emphysema.  Irregular 6 mm left upper lobe pulmonary nodule-stable and stable right upper lobe 5 mm pulmonary nodule.    Overnight oximetry test September 2021 showed no desaturations on 5 L of oxygen.   CT chest 09/2019 -No CT evidence of pulmonary embolism. 2. Marked severity emphysematous lung disease.(no mention of lung nodules )    CT chest January 12, 2020 negative for PE.  Small sliding hiatal hernia.  Emphysematous changes.  Basilar atelectasis. (no mention of lung nodules )  Chest x-ray February 21, 2020 interstitial thickening of the lung bases and emphysema in the upper lobes.   Hospitalization June 2021 for COPD exacerbation and hypercarbic hypoxic respiratory failure required BiPAP support.  06/05/2021 Follow up : COPD and oxygen dependent respiratory failure Patient presents for a 2-week follow-up patient has underlying severe COPD and oxygen dependent respiratory failure.  She has been having increased symptom burden over the last several months.  Feels that she has had a clinical decline with increased shortness of breath with activity, decreased activity tolerance and fatigue.  She was previously on 2 L of oxygen at rest and 3 L of oxygen with activity.  Patient says that she has  had increase her oxygen up to 4 to 6 L to help with her shortness of breath.  She has had no desaturations.  Today in the office O2 saturations are 99% on 3 L. Patient does have cardiomyopathy followed by cardiology.  She has been evaluated for possible upcoming cardiac cath.  2D echo in February showed EF at 40 to 45%.  And grade 1 diastolic dysfunction.  She is maintained on Lasix 20 mg daily.  She denies any increased leg swelling.  Weight has been stable Last visit patient was recommended to continue on her aggressive maintenance regimen with budesonide, Brovana nebulizer twice daily and DuoNeb 4 times daily.  She was placed on a low dose prednisone taper.  Patient says the first few days on prednisone 20 mg and 10 mg seem to help but as soon as she started going down the dose symptoms declined.  She says she had a few good days of where she had some improved activity tolerance.  Otherwise she is short of breath with minimal activities and sometimes at rest.  Says that she can barely do any house chores without becoming short of breath.  Patient does get chest tightness and pressure with activities.  She denies any hemoptysis or calf pain.  No previous history of VTE.  She denies any increased cough or congestion.  No wheezing.   Allergies  Allergen Reactions   Gabapentin Other (See Comments)    Affects breathing   Pravastatin Sodium Other (See Comments)    See coments   Rosuvastatin Calcium Other (See Comments)  Simvastatin Other (See Comments)   Amoxicillin Rash   Lisinopril Rash   Sulfacetamide Rash   Tetracyclines & Related Rash    Immunization History  Administered Date(s) Administered   Fluad Quad(high Dose 65+) 04/07/2020   Influenza Split 04/14/2018, 03/16/2019, 04/07/2019   Influenza, High Dose Seasonal PF 04/14/2018, 05/01/2019   Influenza-Unspecified 04/27/2021   Moderna SARS-COV2 Booster Vaccination 02/10/2021   Moderna Sars-Covid-2 Vaccination 08/16/2019, 09/13/2019,  05/22/2020   Pneumococcal Conjugate-13 11/15/2016, 02/05/2018   Tdap 07/27/2018   Zoster Recombinat (Shingrix) 07/14/2018, 09/14/2018   Zoster, Live 07/14/2018, 09/14/2018    Past Medical History:  Diagnosis Date   Congestive heart disease ([place])    COPD (chronic obstructive pulmonary disease) ([place])    Diabetes ([place])    Hyperlipemia    Osteoporosis    Primary hypertension     Tobacco History: Social History   Tobacco Use  Smoking Status Former   Packs/day: 1.50   Years: 40.00   Pack years: 60.00   Types: Cigarettes   Quit date: 2003   Years since quitting: 19.9  Smokeless Tobacco Never   Counseling given: Not Answered   Outpatient Medications Prior to Visit  Medication Sig Dispense Refill   acetaminophen (TYLENOL) 500 MG tablet Take 1,000 mg by mouth 2 (two) times daily as needed for mild pain or headache.     albuterol (PROVENTIL) (2.5 MG/3ML) 0.083% nebulizer solution Take 3 mLs (2.5 mg total) by nebulization in the morning, at noon, in the evening, and at bedtime. 360 mL 5   albuterol (VENTOLIN HFA) 108 (90 Base) MCG/ACT inhaler Inhale 1-2 puffs into the lungs every 6 (six) hours as needed for wheezing or shortness of breath. 8 g 5   brimonidine (ALPHAGAN) 0.2 % ophthalmic solution Place 1 drop into the left eye 2 (two) times daily.     BROVANA 15 MCG/2ML NEBU USE 1 VIAL  IN  NEBULIZER TWICE  DAILY - morning and evening 2 mL 11   budesonide (PULMICORT) 0.5 MG/2ML nebulizer solution USE 1 VIAL  IN  NEBULIZER TWICE  DAILY - Rinse mouth after treatment 120 mL 11   Calcium Carbonate-Vitamin D 600-200 MG-UNIT TABS Take 1 tablet by mouth daily.      carboxymethylcellulose (REFRESH PLUS) 0.5 % SOLN 1 drop 3 (three) times daily as needed.     carvedilol (COREG) 25 MG tablet Take 25 mg by mouth 2 (two) times daily.      estradiol (ESTRACE) 0.1 MG/GM vaginal cream Place 1 Applicatorful vaginally every other day.      furosemide (LASIX) 20 MG tablet Take 1 tablet (20 mg total) by  mouth daily. Take extra 1 tablet by mouth daily as needed for edema. 100 tablet 3   ipratropium (ATROVENT) 0.02 % nebulizer solution Take 2.5 mLs (0.5 mg total) by nebulization 4 (four) times daily. 300 mL 5   losartan (COZAAR) 50 MG tablet Take 1 tablet (50 mg total) by mouth daily. 90 tablet 3   metFORMIN (GLUCOPHAGE) 500 MG tablet Take 500 mg by mouth daily with supper.      vitamin B-12 (CYANOCOBALAMIN) 1000 MCG tablet Take 1,000 mcg by mouth daily. (Patient not taking: Reported on 06/05/2021)     predniSONE (DELTASONE) 10 MG tablet 2 tabs for  5 days, then 1 tabs for 5 days, 1/2 tab daily for 5 days , then stop (Patient not taking: Reported on 06/05/2021) 20 tablet 0   No facility-administered medications prior to visit.     Review of Systems:  Constitutional:   No  weight loss, night sweats,  Fevers, chills,  +fatigue, or  lassitude.  HEENT:   No headaches,  Difficulty swallowing,  Tooth/dental problems, or  Sore throat,                No sneezing, itching, ear ache, nasal congestion, post nasal drip,   CV:  No chest pain,  Orthopnea, PND, swelling in lower extremities, anasarca, dizziness, palpitations, syncope.   GI  No heartburn, indigestion, abdominal pain, nausea, vomiting, diarrhea, change in bowel habits, loss of appetite, bloody stools.   Resp: No chest wall deformity  Skin: no rash or lesions.  GU: no dysuria, change in color of urine, no urgency or frequency.  No flank pain, no hematuria   MS:  No joint pain or swelling.  No decreased range of motion.  No back pain.    Physical Exam  BP 124/68 (BP Location: Left Arm, Patient Position: Sitting, Cuff Size: Normal)   Pulse 85   Temp 98.2 F (36.8 C) (Oral)   Ht 5\' 2"  (1.575 m)   Wt 158 lb 12.8 oz (72 kg)   SpO2 99% Comment: 3L [place]  BMI 29.04 kg/m   GEN: A/Ox3; pleasant , NAD, chronically ill-appearing on oxygen, rolling walker   HEENT:  [place]/AT,  NOSE-clear, THROAT-clear, no lesions, no postnasal drip or  exudate noted.   NECK:  Supple w/ fair ROM; no JVD; normal carotid impulses w/o bruits; no thyromegaly or nodules palpated; no lymphadenopathy.    RESP decreased breath sounds in the bases  no accessory muscle use, no dullness to percussion  CARD:  RRR, no m/r/g, tr  peripheral edema, pulses intact, no cyanosis or clubbing.  GI:   Soft & nt; nml bowel sounds; no organomegaly or masses detected.   [place]co: Warm bil, no deformities or joint swelling noted.   Neuro: alert, no focal deficits noted.    Skin: Warm, no lesions or rashes    Lab Results:  CBC    Component Value Date/Time   WBC 7.1 02/21/2020 1129   RBC 3.62 (L) 02/21/2020 1129   HGB 11.0 (L) 02/21/2020 1129   HCT 36.8 02/21/2020 1129   PLT 233 02/21/2020 1129   MCV 101.7 (H) 02/21/2020 1129   MCH 30.4 02/21/2020 1129   MCHC 29.9 (L) 02/21/2020 1129   RDW 13.6 02/21/2020 1129   LYMPHSABS 1.1 02/21/2020 1129   MONOABS 0.5 02/21/2020 1129   EOSABS 0.0 02/21/2020 1129   BASOSABS 0.0 02/21/2020 1129    BMET    Component Value Date/Time   NA 137 05/02/2021 0929   K 4.4 05/02/2021 0929   CL 92 (L) 05/02/2021 0929   CO2 35 (H) 05/02/2021 0929   GLUCOSE 136 (H) 05/02/2021 0929   GLUCOSE 131 (H) 02/21/2020 1129   BUN 14 05/02/2021 0929   CREATININE 0.78 05/02/2021 0929   CALCIUM 9.5 05/02/2021 0929   GFRNONAA 88 08/03/2020 1149   GFRAA 102 08/03/2020 1149    BNP    Component Value Date/Time   BNP 116.4 (H) 02/21/2020 1129    ProBNP    Component Value Date/Time   PROBNP 179 04/10/2021 1518   PROBNP 105.0 (H) 06/15/2018 1255    Imaging: No results found.    No flowsheet data found.  No results found for: NITRICOXIDE      Assessment & Plan:   COPD with chronic bronchitis and emphysema (HCC) Severe COPD with emphysema-patient has had very high symptom burden.  And can  appears to be having a progressive decline.  Recent chest x-ray with no acute process.  We will check lab work including  D-dimer, CBC.  Recent BNP was unrevealing. No evidence of volume overload on exam. Agree with going forward with cardiac evaluation.  Palliative care has been ordered and is pending. If D-dimer is elevated will proceed with a CTA angio to rule out PE.  Patient is on hormone replacement therapy She did have some perceived clinical benefit on low-dose prednisone.  We discussed in detail the risk of chronic steroid use.  At this point she has very high symptom burden despite maximum maintenance regimen.  We will begin low-dose prednisone and close follow-up.  Plan  Patient Instructions  Increase Prednisone 20mg  daily for 3 days then 10mg  daily and hold at this dose until seen back.  Labs today .  Continue on Budesonide Neb Twice daily  .  Continue on Brovana Twice daily  .  Continue on Albuterol and Ipratropium Neb four times a day   Activity as tolerated Saline nasal spray Twice daily   Saline nasal gel At bedtime   Incentive spirometry daily  Robitussin Cough syrup As needed  cough/congestion  Continue on oxygen 4l/m rest and 5l/m activity . O2 sats goal >88-90%.  Follow-up with Dr. Elsworth Soho or Aniel Hubble NP in 4- weeks  and As needed  Please contact office for sooner follow up if symptoms do not improve or worsen or seek emergency care        Chronic respiratory failure with hypoxia ([place]) O2 saturations are adequate on current settings.  Patient is to maintain O2 saturations greater than 88 to 90%.  Titrate for goal  Plan  Patient Instructions  Increase Prednisone 20mg  daily for 3 days then 10mg  daily and hold at this dose until seen back.  Labs today .  Continue on Budesonide Neb Twice daily  .  Continue on Brovana Twice daily  .  Continue on Albuterol and Ipratropium Neb four times a day   Activity as tolerated Saline nasal spray Twice daily   Saline nasal gel At bedtime   Incentive spirometry daily  Robitussin Cough syrup As needed  cough/congestion  Continue on oxygen 4l/m rest  and 5l/m activity . O2 sats goal >88-90%.  Follow-up with Dr. Elsworth Soho or Kialee Kham NP in 4- weeks  and As needed  Please contact office for sooner follow up if symptoms do not improve or worsen or seek emergency care       Palliative care encounter Patient has severe COPD with significant symptom burden and physical deconditioning.  Palliative care referral is pending  Physical deconditioning Activity as tolerated  Nonischemic cardiomyopathy [facility]) Patient is continue follow-up with cardiology.  No evidence of volume overload on exam.    I spent   40 minutes dedicated to the care of this patient on the date of this encounter to include pre-visit review of records, face-to-face time with the patient discussing conditions above, post visit ordering of testing, clinical documentation with the electronic health record, making appropriate referrals as documented, and communicating necessary findings to members of the patients care team.   Rexene Edison, NP 06/05/2021

## 2021-06-05 NOTE — Addendum Note (Signed)
Addended by: Delrae Rend on: 06/05/2021 11:06 AM   Modules accepted: Orders

## 2021-06-05 NOTE — Patient Instructions (Addendum)
Medication Instructions:  Your physician has recommended you make the following change in your medication:   Change: Please increase your lasix to 20mg  twice a day for three days and then return to your normal 20mg  daily   *If you need a refill on your cardiac medications before your next appointment, please call your pharmacy*   Lab Work: None ordered today   Testing/Procedures: Right and Left Heart Cath    Follow-Up: At [facility], you and your health needs are our priority.  As part of our continuing mission to provide you with exceptional heart care, we have created designated Provider Care Teams.  These Care Teams include your primary Cardiologist (physician) and Advanced Practice Providers (APPs -  Physician Assistants and Nurse Practitioners) who all work together to provide you with the care you need, when you need it.  We recommend signing up for the patient portal called "MyChart".  Sign up information is provided on this After Visit Summary.  MyChart is used to connect with patients for Virtual Visits (Telemedicine).  Patients are able to view lab/test results, encounter notes, upcoming appointments, etc.  Non-urgent messages can be sent to your provider as well.   To learn more about what you can do with MyChart, go to .    Your next appointment:   3 week(s)  The format for your next appointment:   In Person  Provider:   [facility], MD  or APPs    Other Instructions  [facility] CARDIOLOGY [address] [place] Dept: [phone]  Terriyah Westra  06/05/2021  You are scheduled for a Cardiac Catheterization on Thursday, December 1 with Dr. [name] 06/07/2021.  1. Please arrive at the [facility] (Main Entrance A) at [facility]: [address] [place], [address] [place] at 10:00AM (This time is two hours before your procedure to ensure your  preparation). Free valet parking service is available.   Special note: Every effort is made to have your procedure done on time. Please understand that emergencies sometimes delay scheduled procedures.  2. Diet: Do not eat solid foods after midnight.  The patient may have clear liquids until 5am upon the day of the procedure.  3. Labs: You have already had your pre procedure labs drawn  4. Medication instructions in preparation for your procedure:  Do not take Diabetes Med Glucophage (Metformin) on the day of the procedure and HOLD 48 HOURS AFTER THE PROCEDURE.  On the morning of your procedure, take your Aspirin 81mg  and any morning medicines NOT listed above.  You may use sips of water.  5. Plan for one night stay--bring personal belongings. 6. Bring a current list of your medications and current insurance cards. 7. You MUST have a responsible person to drive you home. 8. Someone MUST be with you the first 24 hours after you arrive home or your discharge will be delayed. 9. Please wear clothes that are easy to get on and off and wear slip-on shoes.  Thank you for allowing [place] to care for you!   -- [facility]

## 2021-06-05 NOTE — Assessment & Plan Note (Signed)
Severe COPD with emphysema-patient has had very high symptom burden.  And can appears to be having a progressive decline.  Recent chest x-ray with no acute process.  We will check lab work including D-dimer, CBC.  Recent BNP was unrevealing. No evidence of volume overload on exam. Agree with going forward with cardiac evaluation.  Palliative care has been ordered and is pending. If D-dimer is elevated will proceed with a CTA angio to rule out PE.  Patient is on hormone replacement therapy She did have some perceived clinical benefit on low-dose prednisone.  We discussed in detail the risk of chronic steroid use.  At this point she has very high symptom burden despite maximum maintenance regimen.  We will begin low-dose prednisone and close follow-up.  Plan  Patient Instructions  Increase Prednisone 20mg  daily for 3 days then 10mg  daily and hold at this dose until seen back.  Labs today .  Continue on Budesonide Neb Twice daily  .  Continue on Brovana Twice daily  .  Continue on Albuterol and Ipratropium Neb four times a day   Activity as tolerated Saline nasal spray Twice daily   Saline nasal gel At bedtime   Incentive spirometry daily  Robitussin Cough syrup As needed  cough/congestion  Continue on oxygen 4l/m rest and 5l/m activity . O2 sats goal >88-90%.  Follow-up with Dr. or Almeta Geisel NP in 4- weeks  and As needed  Please contact office for sooner follow up if symptoms do not improve or worsen or seek emergency care

## 2021-06-05 NOTE — Assessment & Plan Note (Signed)
Patient has severe COPD with significant symptom burden and physical deconditioning.  Palliative care referral is pending

## 2021-06-05 NOTE — Progress Notes (Signed)
Office Visit    Patient Name: Rebecca [name] Date of Encounter: 06/05/2021  PCP:  Farris Has, MD   [facility]  Cardiologist:  Meriam Sprague, MD  Advanced Practice Provider:  No care team member to display Electrophysiologist:  None   Chief Complaint    Rebecca [name] is a 80 y.o. 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  Allergies  Allergies  Allergen Reactions   Gabapentin Other (See Comments)    Affects breathing   Pravastatin Sodium Other (See Comments)    See coments   Rosuvastatin Calcium Other (See Comments)   Simvastatin Other (See Comments)   Amoxicillin Rash   Lisinopril Rash   Sulfacetamide Rash   Tetracyclines & Related Rash    History of Present Illness    Rebecca [name] is a 80 y.o. 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  Myoview 07/2020 with LVEF 46%, septal movement consistent with a left bundle branch block, no new ischemia.  Subsequent echo 08/2020 LVEF 40 to 45% with anterior septal and inferior septal hypokinesis, grade 1 diastolic dysfunction.  She was last seen 04/10/2021 by Tereso Newcomer.  She continues to prefer initial medical management the cardiac catheterization was discussed in detail.  She has since sent to MyChart message that she is interested in cardiac  catheterization.  She presents today for follow-up.  She was seen earlier today by pulmonology and was started on prednisone.  She notes her dyspnea on exertion with minimal activity is progressed despite 3 L of home oxygen.  Reports occasional chest discomfort with her exertional dyspnea.  The chest pain is midsternal and resolves by rest.  No edema, orthopnea, PND.  Endorses adhering to low-sodium diet and fluid restriction.  She is interested in pursuing cardiac catheterization for further diagnostic benefit.   EKGs/Labs/Other Studies Reviewed:   The following studies were reviewed today:  Echocardiogram 08/18/2020 EF 40-45, anteroseptal and inferoseptal HK, GR 1 DD, normal RVSF   GATED SPECT MYO PERF W/LEXISCAN STRESS 1D 08/07/2020 Narrative 1. There are reduced counts in the septal segments on rest imaging that improve on stress imaging with normal wall motion consistent with left bundle branch block artifact. No evidence of ischemia or infarction. 2. Mildly reduced LVEF, 46%. Septal movement consistent with LBBB. 3. This is an low- to intermediate-risk study.   Chest CTA 01/12/2020 IMPRESSION: 1. No definite evidence of pulmonary embolus. 2. Coronary artery calcifications are noted suggesting coronary artery disease. 3. Small sliding-type hiatal hernia. 4. Bilateral nephrolithiasis.   Aortic Atherosclerosis (ICD10-I70.0) and Emphysema (ICD10-J43.9).  EKG:  EKG is  ordered today.  The ekg ordered today demonstrates NSR 87 bpm with PAC and LBBB.  Recent Labs: 04/10/2021: NT-Pro BNP 179 06/05/2021: BUN 16; Creatinine, Ser 0.74; Hemoglobin 11.1; Platelets 226.0; Potassium 4.7;  Sodium 139  Recent Lipid Panel No results found for: CHOL, TRIG, HDL, CHOLHDL, VLDL, LDLCALC, LDLDIRECT   Home Medications   Current Meds  Medication Sig   acetaminophen (TYLENOL) 500 MG tablet Take 1,000 mg by mouth 2 (two) times daily as needed for mild pain or headache.   albuterol (PROVENTIL) (2.5  MG/3ML) 0.083% nebulizer solution Take 3 mLs (2.5 mg total) by nebulization in the morning, at noon, in the evening, and at bedtime.   albuterol (VENTOLIN HFA) 108 (90 Base) MCG/ACT inhaler Inhale 1-2 puffs into the lungs every 6 (six) hours as needed for wheezing or shortness of breath.   brimonidine (ALPHAGAN) 0.2 % ophthalmic solution Place 1 drop into the left eye 2 (two) times daily.   BROVANA 15 MCG/2ML NEBU USE 1 VIAL  IN  NEBULIZER TWICE  DAILY - morning and evening   budesonide (PULMICORT) 0.5 MG/2ML nebulizer solution USE 1 VIAL  IN  NEBULIZER TWICE  DAILY - Rinse mouth after treatment   Calcium Carbonate-Vitamin D 600-200 MG-UNIT TABS Take 1 tablet by mouth daily.    carboxymethylcellulose (REFRESH PLUS) 0.5 % SOLN 1 drop 3 (three) times daily as needed.   carvedilol (COREG) 25 MG tablet Take 25 mg by mouth 2 (two) times daily.    estradiol (ESTRACE) 0.1 MG/GM vaginal cream Place 1 Applicatorful vaginally every other day.    furosemide (LASIX) 20 MG tablet Take 1 tablet (20 mg total) by mouth daily. Take extra 1 tablet by mouth daily as needed for edema.   ipratropium (ATROVENT) 0.02 % nebulizer solution Take 2.5 mLs (0.5 mg total) by nebulization 4 (four) times daily.   losartan (COZAAR) 50 MG tablet Take 1 tablet (50 mg total) by mouth daily.   metFORMIN (GLUCOPHAGE) 500 MG tablet Take 500 mg by mouth daily with supper.    predniSONE (DELTASONE) 10 MG tablet 2 daily for 3 days then 1 tablet daily   vitamin B-12 (CYANOCOBALAMIN) 1000 MCG tablet Take 1,000 mcg by mouth daily.     Review of Systems      All other systems reviewed and are otherwise negative except as noted above.  Physical Exam    VS:  BP (!) 152/84 (BP Location: Right Arm, Patient Position: Sitting, Cuff Size: Normal)   Pulse 86   Ht 5\' 2"  (1.575 m)   Wt 161 lb (73 kg)   SpO2 91%   BMI 29.45 kg/m  , BMI Body mass index is 29.45 kg/m.  Wt Readings from Last 3 Encounters:  06/05/21 161 lb (73 kg)  06/05/21  158 lb 12.8 oz (72 kg)  05/22/21 158 lb 12.8 oz (72 kg)     GEN: Well nourished, well developed, in no acute distress. HEENT: normal. Neck: Supple, no JVD, carotid bruits, or masses. Cardiac: RRR, no murmurs, rubs, or gallops. No clubbing, cyanosis, edema.  Radials/PT 2+ and equal bilaterally.  Respiratory:  Respirations regular and unlabored, lung sounds diminished bilaterally. On 3L home O2. GI: Soft, nontender, nondistended. MS: No deformity or atrophy. Skin: Warm and dry, no rash. Neuro:  Strength and sensation are intact. Psych: Normal affect.  Assessment & Plan    HFmrEF - Euvolemic on exam though with progressive exertional dyspnea. No edema, orthopnea, PND. Marland Kitchen Continue Lasix at current dose. Given progressive dyspnea and reduced LVEF by echo 08/2020 plan for [facility]. No SGLT2i due to frequent UTI. GDMT includes Lasix, Spironolactone, Losartan, Carvediol.   Coronary artery calcification by CT / DOE  -progressive dyspnea on exertion and  chest discomfort concerning for anginal equivalent.  Plan for cardiac catheterization.  Shared Decision Making/Informed Consent{ The risks [stroke (1 in 1000), death (1 in 1000), kidney failure [usually temporary] (1 in 500), bleeding (1 in 200), allergic reaction [possibly serious] (1 in 200)], benefits (diagnostic support and management of coronary artery disease) and alternatives of a cardiac catheterization were discussed in detail with Ms. Wint and she is willing to proceed.   COPD- follows with pulmonology was recently restarted on Prednisone.On 3L home O2.    HTN - BP well controlled. Continue current antihypertensive regimen of Coreg 25mg  BID, Spironolactone 12.5mg  QD, Losartan 50mg  QD.   HLD, LDL goal <70 - Previous intolerance to statin. Will require further medication optimization post cath. Consider PCSK9i vs Zeta at follow up.   LBBB - Stable finding by EKG today. No lightheadedness, dizziness. Continue to monitor with periodic EKG.    Disposition: Follow up in 3 week(s) with Freada Bergeron, MD or APP.  Signed, Loel Dubonnet, NP 06/05/2021, 2:15 PM [facility]

## 2021-06-05 NOTE — Assessment & Plan Note (Signed)
Patient is continue follow-up with cardiology.  No evidence of volume overload on exam.

## 2021-06-05 NOTE — Telephone Encounter (Signed)
I called [phone] and they have the order they have sent the referral over to the team at [facility] is where she lives and they are working on getting her set up

## 2021-06-05 NOTE — Telephone Encounter (Signed)
Palliative care consult placed for patient on 05/24/21, patient has not yet been contacted regarding consult.  I called [phone] and was not able to speak with anyone at [facility].  Can you all please follow up on the status of the referral please?  Thank you.

## 2021-06-05 NOTE — Telephone Encounter (Signed)
Received a palliative care referral from Rubye Oaks NP for this patient. I called and spoke with patient who says she is about to go out of town for Thanksgiving. She requests that I call her back on Monday 06/11/21 to schedule.

## 2021-06-05 NOTE — Assessment & Plan Note (Signed)
O2 saturations are adequate on current settings.  Patient is to maintain O2 saturations greater than 88 to 90%.  Titrate for goal  Plan  Patient Instructions  Increase Prednisone 20mg  daily for 3 days then 10mg  daily and hold at this dose until seen back.  Labs today .  Continue on Budesonide Neb Twice daily  .  Continue on Brovana Twice daily  .  Continue on Albuterol and Ipratropium Neb four times a day   Activity as tolerated Saline nasal spray Twice daily   Saline nasal gel At bedtime   Incentive spirometry daily  Robitussin Cough syrup As needed  cough/congestion  Continue on oxygen 4l/m rest and 5l/m activity . O2 sats goal >88-90%.  Follow-up with Dr. or Sufyaan Palma NP in 4- weeks  and As needed  Please contact office for sooner follow up if symptoms do not improve or worsen or seek emergency care

## 2021-06-05 NOTE — Assessment & Plan Note (Signed)
Activity as tolerated

## 2021-06-06 ENCOUNTER — Ambulatory Visit (HOSPITAL_BASED_OUTPATIENT_CLINIC_OR_DEPARTMENT_OTHER): Payer: Medicare HMO

## 2021-06-06 ENCOUNTER — Other Ambulatory Visit: Payer: Self-pay | Admitting: *Deleted

## 2021-06-06 ENCOUNTER — Encounter (HOSPITAL_BASED_OUTPATIENT_CLINIC_OR_DEPARTMENT_OTHER): Payer: Self-pay | Admitting: Family

## 2021-06-06 DIAGNOSIS — M6281 Muscle weakness (generalized): Secondary | ICD-10-CM | POA: Diagnosis not present

## 2021-06-06 DIAGNOSIS — R0609 Other forms of dyspnea: Secondary | ICD-10-CM

## 2021-06-06 DIAGNOSIS — R7989 Other specified abnormal findings of blood chemistry: Secondary | ICD-10-CM

## 2021-06-06 NOTE — Progress Notes (Signed)
Called and spoke with patient, advised of results/recommendations per Rubye Oaks NP.  She verbalized understanding.  She states she took her Metformin 06/05/21 at 10 pm.  She would like to have the scan done at [facility] if at all possible.  Advised I would let the [facility] know.  CTA ordered stat.

## 2021-06-07 ENCOUNTER — Other Ambulatory Visit: Payer: Self-pay | Admitting: Adult Health

## 2021-06-11 ENCOUNTER — Ambulatory Visit (HOSPITAL_BASED_OUTPATIENT_CLINIC_OR_DEPARTMENT_OTHER)
Admission: RE | Admit: 2021-06-11 | Discharge: 2021-06-11 | Disposition: A | Discharge status: Home / Self Care | Payer: Medicare HMO | Source: Ambulatory Visit | Attending: Adult Health | Admitting: Adult Health

## 2021-06-11 ENCOUNTER — Other Ambulatory Visit: Payer: Self-pay

## 2021-06-11 ENCOUNTER — Telehealth: Payer: Self-pay

## 2021-06-11 ENCOUNTER — Encounter (HOSPITAL_BASED_OUTPATIENT_CLINIC_OR_DEPARTMENT_OTHER): Payer: Self-pay

## 2021-06-11 DIAGNOSIS — R7989 Other specified abnormal findings of blood chemistry: Secondary | ICD-10-CM | POA: Diagnosis not present

## 2021-06-11 DIAGNOSIS — M6281 Muscle weakness (generalized): Secondary | ICD-10-CM | POA: Diagnosis not present

## 2021-06-11 DIAGNOSIS — R0609 Other forms of dyspnea: Secondary | ICD-10-CM | POA: Diagnosis not present

## 2021-06-11 DIAGNOSIS — K449 Diaphragmatic hernia without obstruction or gangrene: Secondary | ICD-10-CM | POA: Diagnosis not present

## 2021-06-11 DIAGNOSIS — R0602 Shortness of breath: Secondary | ICD-10-CM | POA: Diagnosis not present

## 2021-06-11 MED ORDER — IOHEXOL 350 MG/ML SOLN
75.0000 mL | Freq: Once | INTRAVENOUS | Status: AC | PRN
  Administered 2021-06-11: 10:00:00 75 mL via INTRAVENOUS

## 2021-06-11 NOTE — Telephone Encounter (Signed)
(  4:17 pm) SW scheduled initial palliative care visit with patient. RN/SW scheduled to see patient  on 06/20/21 @ 10 am.

## 2021-06-11 NOTE — Telephone Encounter (Signed)
Rubye Oaks NP updated on status of PC consult.  Nothing further needed.

## 2021-06-12 ENCOUNTER — Telehealth: Payer: Self-pay | Admitting: Adult Health

## 2021-06-12 ENCOUNTER — Telehealth: Payer: Self-pay | Admitting: *Deleted

## 2021-06-12 DIAGNOSIS — J449 Chronic obstructive pulmonary disease, unspecified: Secondary | ICD-10-CM | POA: Diagnosis not present

## 2021-06-12 NOTE — Telephone Encounter (Signed)
Cardiac catheterization scheduled at [facility] for: Thursday June 14, 2021 12 Noon Arrive [facility] Main Entrance A [facility]) at: 10 AM   No solid food after midnight prior to cath, clear liquids until 5 AM day of procedure.  Medication instructions: Hold: Metformin-day of procedure and 48 hours post procedure Lasix -AM of procedure  Except hold medications usual morning medications can be taken pre-cath with sips of water including aspirin 81 mg.    Confirmed patient has responsible adult to drive home post procedure and be with patient first 24 hours after arriving home.  [facility] does allow one visitor to accompany you and wait in the hospital waiting room while you are there for your procedure. You and your visitor will be asked to wear a mask once you enter the hospital.   Patient reports does not currently have any new symptoms concerning for COVID-19 and no household members with COVID-19 like illness.     Reviewed procedure/mask/visitor instructions with patient.

## 2021-06-12 NOTE — Telephone Encounter (Signed)
Noted.   RA please advise. Thanks :)

## 2021-06-13 ENCOUNTER — Telehealth: Payer: Self-pay

## 2021-06-13 DIAGNOSIS — R2689 Other abnormalities of gait and mobility: Secondary | ICD-10-CM | POA: Diagnosis not present

## 2021-06-13 DIAGNOSIS — M6281 Muscle weakness (generalized): Secondary | ICD-10-CM | POA: Diagnosis not present

## 2021-06-13 DIAGNOSIS — R0602 Shortness of breath: Secondary | ICD-10-CM | POA: Diagnosis not present

## 2021-06-13 NOTE — Telephone Encounter (Signed)
Received  fax for clinical information for cardiac cath.Dr.Jordan completed clinical questions.Faxed back to fax # [phone].

## 2021-06-14 ENCOUNTER — Ambulatory Visit (HOSPITAL_COMMUNITY): Payer: Medicare HMO

## 2021-06-14 ENCOUNTER — Encounter (HOSPITAL_COMMUNITY): Payer: Self-pay | Admitting: Cardiology

## 2021-06-14 ENCOUNTER — Ambulatory Visit (HOSPITAL_COMMUNITY)
Admission: RE | Disposition: A | Discharge status: Home / Self Care | Payer: Self-pay | Source: Ambulatory Visit | Attending: Cardiology

## 2021-06-14 ENCOUNTER — Other Ambulatory Visit: Payer: Self-pay

## 2021-06-14 ENCOUNTER — Ambulatory Visit (HOSPITAL_COMMUNITY)
Admission: RE | Admit: 2021-06-14 | Discharge: 2021-06-15 | Disposition: A | Discharge status: Home / Self Care | Payer: Medicare HMO | Source: Ambulatory Visit | Attending: Cardiology | Admitting: Cardiology

## 2021-06-14 DIAGNOSIS — I509 Heart failure, unspecified: Secondary | ICD-10-CM | POA: Insufficient documentation

## 2021-06-14 DIAGNOSIS — E119 Type 2 diabetes mellitus without complications: Secondary | ICD-10-CM | POA: Insufficient documentation

## 2021-06-14 DIAGNOSIS — J9611 Chronic respiratory failure with hypoxia: Secondary | ICD-10-CM | POA: Diagnosis present

## 2021-06-14 DIAGNOSIS — Z79899 Other long term (current) drug therapy: Secondary | ICD-10-CM | POA: Insufficient documentation

## 2021-06-14 DIAGNOSIS — I110 Hypertensive heart disease with heart failure: Secondary | ICD-10-CM | POA: Diagnosis not present

## 2021-06-14 DIAGNOSIS — R0609 Other forms of dyspnea: Secondary | ICD-10-CM | POA: Diagnosis not present

## 2021-06-14 DIAGNOSIS — E785 Hyperlipidemia, unspecified: Secondary | ICD-10-CM | POA: Insufficient documentation

## 2021-06-14 DIAGNOSIS — J449 Chronic obstructive pulmonary disease, unspecified: Secondary | ICD-10-CM | POA: Insufficient documentation

## 2021-06-14 DIAGNOSIS — Z9981 Dependence on supplemental oxygen: Secondary | ICD-10-CM | POA: Insufficient documentation

## 2021-06-14 DIAGNOSIS — Z20822 Contact with and (suspected) exposure to covid-19: Secondary | ICD-10-CM | POA: Insufficient documentation

## 2021-06-14 DIAGNOSIS — I442 Atrioventricular block, complete: Secondary | ICD-10-CM | POA: Diagnosis not present

## 2021-06-14 DIAGNOSIS — R0602 Shortness of breath: Secondary | ICD-10-CM

## 2021-06-14 DIAGNOSIS — I2510 Atherosclerotic heart disease of native coronary artery without angina pectoris: Secondary | ICD-10-CM | POA: Diagnosis not present

## 2021-06-14 DIAGNOSIS — J9622 Acute and chronic respiratory failure with hypercapnia: Secondary | ICD-10-CM | POA: Diagnosis not present

## 2021-06-14 DIAGNOSIS — I428 Other cardiomyopathies: Secondary | ICD-10-CM | POA: Diagnosis not present

## 2021-06-14 DIAGNOSIS — J9612 Chronic respiratory failure with hypercapnia: Secondary | ICD-10-CM | POA: Diagnosis present

## 2021-06-14 DIAGNOSIS — I5022 Chronic systolic (congestive) heart failure: Secondary | ICD-10-CM

## 2021-06-14 HISTORY — PX: RIGHT/LEFT HEART CATH AND CORONARY ANGIOGRAPHY: CATH118266

## 2021-06-14 HISTORY — PX: TEMPORARY PACEMAKER: CATH118268

## 2021-06-14 LAB — RESP PANEL BY RT-PCR (FLU A&B, COVID) ARPGX2
Influenza A by PCR: NEGATIVE
Influenza B by PCR: NEGATIVE
SARS Coronavirus 2 by RT PCR: NEGATIVE

## 2021-06-14 LAB — GLUCOSE, CAPILLARY
Glucose-Capillary: 123 mg/dL — ABNORMAL HIGH (ref 70–99)
Glucose-Capillary: 191 mg/dL — ABNORMAL HIGH (ref 70–99)

## 2021-06-14 LAB — POCT I-STAT 7, (LYTES, BLD GAS, ICA,H+H)
Acid-Base Excess: 15 mmol/L — ABNORMAL HIGH (ref 0.0–2.0)
Bicarbonate: 45.2 mmol/L — ABNORMAL HIGH (ref 20.0–28.0)
Calcium, Ion: 1.27 mmol/L (ref 1.15–1.40)
HCT: 33 % — ABNORMAL LOW (ref 36.0–46.0)
Hemoglobin: 11.2 g/dL — ABNORMAL LOW (ref 12.0–15.0)
O2 Saturation: 99 %
Potassium: 3.7 mmol/L (ref 3.5–5.1)
Sodium: 138 mmol/L (ref 135–145)
TCO2: 48 mmol/L — ABNORMAL HIGH (ref 22–32)
pCO2 arterial: 91 mmHg (ref 32.0–48.0)
pH, Arterial: 7.304 — ABNORMAL LOW (ref 7.350–7.450)
pO2, Arterial: 146 mmHg — ABNORMAL HIGH (ref 83.0–108.0)

## 2021-06-14 LAB — MRSA NEXT GEN BY PCR, NASAL: MRSA by PCR Next Gen: NOT DETECTED

## 2021-06-14 SURGERY — RIGHT/LEFT HEART CATH AND CORONARY ANGIOGRAPHY
Anesthesia: LOCAL

## 2021-06-14 MED ORDER — FUROSEMIDE 10 MG/ML IJ SOLN
INTRAMUSCULAR | Status: AC
  Filled 2021-06-14: qty 4

## 2021-06-14 MED ORDER — ASPIRIN 81 MG PO CHEW: 81.0000 mg | CHEWABLE_TABLET | ORAL | Status: DC

## 2021-06-14 MED ORDER — PREDNISONE 10 MG PO TABS: 10.0000 mg | ORAL_TABLET | Freq: Every day | ORAL | Status: DC

## 2021-06-14 MED ORDER — BRIMONIDINE TARTRATE 0.2 % OP SOLN
1.0000 [drp] | Freq: Two times a day (BID) | OPHTHALMIC | Status: DC
  Administered 2021-06-14 – 2021-06-15 (×2): 1 [drp] via OPHTHALMIC
  Filled 2021-06-14: qty 5

## 2021-06-14 MED ORDER — FUROSEMIDE 10 MG/ML IJ SOLN
40.0000 mg | Freq: Once | INTRAMUSCULAR | Status: AC
  Administered 2021-06-14: 40 mg via INTRAVENOUS

## 2021-06-14 MED ORDER — HEPARIN (PORCINE) IN NACL 1000-0.9 UT/500ML-% IV SOLN
INTRAVENOUS | Status: AC
  Filled 2021-06-14: qty 500

## 2021-06-14 MED ORDER — MORPHINE SULFATE (PF) 2 MG/ML IV SOLN
2.0000 mg | INTRAVENOUS | Status: DC | PRN
  Administered 2021-06-14 – 2021-06-15 (×2): 2 mg via INTRAVENOUS
  Filled 2021-06-14 (×2): qty 1

## 2021-06-14 MED ORDER — BUDESONIDE 0.5 MG/2ML IN SUSP
0.5000 mg | Freq: Two times a day (BID) | RESPIRATORY_TRACT | Status: DC
  Administered 2021-06-15: 0.5 mg via RESPIRATORY_TRACT
  Filled 2021-06-14: qty 2

## 2021-06-14 MED ORDER — LIDOCAINE HCL (PF) 1 % IJ SOLN
INTRAMUSCULAR | Status: DC | PRN
  Administered 2021-06-14 (×3): 5 mL

## 2021-06-14 MED ORDER — SODIUM CHLORIDE 0.9 % IV SOLN: 250.0000 mL | INTRAVENOUS | Status: DC | PRN

## 2021-06-14 MED ORDER — IPRATROPIUM-ALBUTEROL 0.5-2.5 (3) MG/3ML IN SOLN
RESPIRATORY_TRACT | Status: AC
  Filled 2021-06-14: qty 3

## 2021-06-14 MED ORDER — EPINEPHRINE 1 MG/10ML IJ SOSY
PREFILLED_SYRINGE | INTRAMUSCULAR | Status: DC | PRN
  Administered 2021-06-14: 1 mg via INTRAVENOUS
  Administered 2021-06-14: .5 mg via INTRAVENOUS

## 2021-06-14 MED ORDER — FENTANYL CITRATE (PF) 100 MCG/2ML IJ SOLN
INTRAMUSCULAR | Status: AC
  Filled 2021-06-14: qty 2

## 2021-06-14 MED ORDER — ATROPINE SULFATE 1 MG/10ML IJ SOSY
PREFILLED_SYRINGE | INTRAMUSCULAR | Status: AC
  Filled 2021-06-14: qty 10

## 2021-06-14 MED ORDER — IPRATROPIUM-ALBUTEROL 0.5-2.5 (3) MG/3ML IN SOLN
3.0000 mL | Freq: Four times a day (QID) | RESPIRATORY_TRACT | Status: DC
  Administered 2021-06-14 – 2021-06-15 (×3): 3 mL via RESPIRATORY_TRACT
  Filled 2021-06-14 (×2): qty 3

## 2021-06-14 MED ORDER — IPRATROPIUM BROMIDE 0.02 % IN SOLN: 0.5000 mg | Freq: Four times a day (QID) | RESPIRATORY_TRACT | Status: DC

## 2021-06-14 MED ORDER — CHLORHEXIDINE GLUCONATE CLOTH 2 % EX PADS: 6.0000 | MEDICATED_PAD | Freq: Every day | CUTANEOUS | Status: DC

## 2021-06-14 MED ORDER — LIDOCAINE HCL (PF) 1 % IJ SOLN
INTRAMUSCULAR | Status: AC
  Filled 2021-06-14: qty 30

## 2021-06-14 MED ORDER — ALBUTEROL SULFATE (2.5 MG/3ML) 0.083% IN NEBU: 2.5000 mg | INHALATION_SOLUTION | Freq: Four times a day (QID) | RESPIRATORY_TRACT | Status: DC

## 2021-06-14 MED ORDER — HEPARIN SODIUM (PORCINE) 1000 UNIT/ML IJ SOLN
INTRAMUSCULAR | Status: DC | PRN
  Administered 2021-06-14: 3500 [IU] via INTRAVENOUS

## 2021-06-14 MED ORDER — SODIUM CHLORIDE 0.9 % IV SOLN: INTRAVENOUS | Status: DC

## 2021-06-14 MED ORDER — PREDNISONE 20 MG PO TABS
40.0000 mg | ORAL_TABLET | Freq: Every day | ORAL | Status: DC
  Administered 2021-06-15: 40 mg via ORAL
  Filled 2021-06-14: qty 2

## 2021-06-14 MED ORDER — LOSARTAN POTASSIUM 50 MG PO TABS
50.0000 mg | ORAL_TABLET | Freq: Every day | ORAL | Status: DC
  Administered 2021-06-15: 50 mg via ORAL
  Filled 2021-06-14: qty 1

## 2021-06-14 MED ORDER — VERAPAMIL HCL 2.5 MG/ML IV SOLN
INTRAVENOUS | Status: DC | PRN
  Administered 2021-06-14: 10 mL via INTRA_ARTERIAL

## 2021-06-14 MED ORDER — SODIUM CHLORIDE 0.9 % WEIGHT BASED INFUSION: 10.0000 mL/h | INTRAVENOUS | Status: AC

## 2021-06-14 MED ORDER — ATROPINE SULFATE 1 MG/10ML IJ SOSY
PREFILLED_SYRINGE | INTRAMUSCULAR | Status: DC | PRN
  Administered 2021-06-14: 1 mg via INTRAVENOUS

## 2021-06-14 MED ORDER — SODIUM CHLORIDE 0.9% FLUSH
3.0000 mL | Freq: Two times a day (BID) | INTRAVENOUS | Status: DC
  Administered 2021-06-15: 3 mL via INTRAVENOUS

## 2021-06-14 MED ORDER — MIDAZOLAM HCL 2 MG/2ML IJ SOLN
INTRAMUSCULAR | Status: AC
  Filled 2021-06-14: qty 2

## 2021-06-14 MED ORDER — FUROSEMIDE 20 MG PO TABS
20.0000 mg | ORAL_TABLET | Freq: Every day | ORAL | Status: DC
  Administered 2021-06-15: 20 mg via ORAL
  Filled 2021-06-14: qty 1

## 2021-06-14 MED ORDER — SODIUM CHLORIDE 0.9% FLUSH: 3.0000 mL | INTRAVENOUS | Status: DC | PRN

## 2021-06-14 MED ORDER — IOHEXOL 350 MG/ML SOLN
INTRAVENOUS | Status: DC | PRN
  Administered 2021-06-14: 40 mL

## 2021-06-14 MED ORDER — MIDAZOLAM HCL 2 MG/2ML IJ SOLN
INTRAMUSCULAR | Status: DC | PRN
  Administered 2021-06-14 (×2): 1 mg via INTRAVENOUS

## 2021-06-14 MED ORDER — BUDESONIDE 0.5 MG/2ML IN SUSP: 0.5000 mg | Freq: Two times a day (BID) | RESPIRATORY_TRACT | Status: DC

## 2021-06-14 MED ORDER — FENTANYL CITRATE (PF) 100 MCG/2ML IJ SOLN
INTRAMUSCULAR | Status: DC | PRN
  Administered 2021-06-14 (×2): 25 ug via INTRAVENOUS

## 2021-06-14 MED ORDER — HEPARIN SODIUM (PORCINE) 1000 UNIT/ML IJ SOLN
INTRAMUSCULAR | Status: AC
  Filled 2021-06-14: qty 10

## 2021-06-14 MED ORDER — BUDESONIDE 0.5 MG/2ML IN SUSP
0.5000 mg | Freq: Every day | RESPIRATORY_TRACT | Status: DC
  Administered 2021-06-14: 0.5 mg via RESPIRATORY_TRACT

## 2021-06-14 MED ORDER — POLYVINYL ALCOHOL 1.4 % OP SOLN: 1.0000 [drp] | Freq: Three times a day (TID) | OPHTHALMIC | Status: DC | PRN

## 2021-06-14 MED ORDER — OYSTER SHELL CALCIUM/D3 500-5 MG-MCG PO TABS
1.0000 | ORAL_TABLET | Freq: Every day | ORAL | Status: DC
  Administered 2021-06-15: 1 via ORAL
  Filled 2021-06-14: qty 1

## 2021-06-14 MED ORDER — ORAL CARE MOUTH RINSE
15.0000 mL | Freq: Two times a day (BID) | OROMUCOSAL | Status: DC
  Administered 2021-06-15: 15 mL via OROMUCOSAL

## 2021-06-14 MED ORDER — ACETAMINOPHEN 325 MG PO TABS: 650.0000 mg | ORAL_TABLET | ORAL | Status: DC | PRN

## 2021-06-14 MED ORDER — EPINEPHRINE 1 MG/10ML IJ SOSY
PREFILLED_SYRINGE | INTRAMUSCULAR | Status: AC
  Filled 2021-06-14: qty 10

## 2021-06-14 MED ORDER — SODIUM CHLORIDE 0.9% FLUSH
3.0000 mL | Freq: Two times a day (BID) | INTRAVENOUS | Status: DC
  Administered 2021-06-14 – 2021-06-15 (×2): 3 mL via INTRAVENOUS

## 2021-06-14 MED ORDER — DOCUSATE SODIUM 100 MG PO CAPS
100.0000 mg | ORAL_CAPSULE | Freq: Every day | ORAL | Status: DC
  Administered 2021-06-15: 100 mg via ORAL
  Filled 2021-06-14: qty 1

## 2021-06-14 MED ORDER — SODIUM CHLORIDE 0.9 % WEIGHT BASED INFUSION: 1.0000 mL/kg/h | INTRAVENOUS | Status: DC

## 2021-06-14 MED ORDER — VERAPAMIL HCL 2.5 MG/ML IV SOLN
INTRAVENOUS | Status: AC
  Filled 2021-06-14: qty 2

## 2021-06-14 MED ORDER — ONDANSETRON HCL 4 MG/2ML IJ SOLN: 4.0000 mg | Freq: Four times a day (QID) | INTRAMUSCULAR | Status: DC | PRN

## 2021-06-14 MED ORDER — ALBUTEROL SULFATE (2.5 MG/3ML) 0.083% IN NEBU
3.0000 mL | INHALATION_SOLUTION | Freq: Four times a day (QID) | RESPIRATORY_TRACT | Status: DC | PRN
  Administered 2021-06-15 (×2): 3 mL via RESPIRATORY_TRACT
  Filled 2021-06-14 (×2): qty 3

## 2021-06-14 MED ORDER — ARFORMOTEROL TARTRATE 15 MCG/2ML IN NEBU
15.0000 ug | INHALATION_SOLUTION | Freq: Two times a day (BID) | RESPIRATORY_TRACT | Status: DC
  Administered 2021-06-14 – 2021-06-15 (×2): 15 ug via RESPIRATORY_TRACT
  Filled 2021-06-14: qty 2

## 2021-06-14 SURGICAL SUPPLY — 18 items
CABLE ADAPT PACING TEMP 12FT (ADAPTER) ×2 IMPLANT
CATH 5FR JL3.5 JR4 ANG PIG MP (CATHETERS) ×2 IMPLANT
CATH BALLN WEDGE 5F 110CM (CATHETERS) ×2 IMPLANT
CATH S G BIP PACING (CATHETERS) ×2 IMPLANT
CATH SWAN GANZ 7F STRAIGHT (CATHETERS) ×2 IMPLANT
DEVICE RAD COMP TR BAND LRG (VASCULAR PRODUCTS) ×2 IMPLANT
GLIDESHEATH SLEND SS 6F .021 (SHEATH) ×2 IMPLANT
GUIDEWIRE .025 260CM (WIRE) ×2 IMPLANT
GUIDEWIRE INQWIRE 1.5J.035X260 (WIRE) ×1 IMPLANT
INQWIRE 1.5J .035X260CM (WIRE) ×2
KIT HEART LEFT (KITS) ×2 IMPLANT
PACK CARDIAC CATHETERIZATION (CUSTOM PROCEDURE TRAY) ×2 IMPLANT
SHEATH GLIDE SLENDER 4/5FR (SHEATH) ×2 IMPLANT
SHEATH PINNACLE 6F 10CM (SHEATH) ×2 IMPLANT
SHEATH PINNACLE 7F 10CM (SHEATH) ×2 IMPLANT
SLEEVE REPOSITIONING LENGTH 30 (MISCELLANEOUS) ×2 IMPLANT
TRANSDUCER W/STOPCOCK (MISCELLANEOUS) ×2 IMPLANT
TUBING CIL FLEX 10 FLL-RA (TUBING) ×2 IMPLANT

## 2021-06-14 NOTE — Progress Notes (Signed)
Per Dr. Judeth Horn, CCM: Sat goal 88% or better.  Patient wears 5L O2 at home. Bipap not needed at this time. Pulmicort nebulizer not needed at 2000 on 06/14/21 since she received it at 1615. Re-start Pulmicort at 0800 06/15/21.

## 2021-06-14 NOTE — Interval H&P Note (Signed)
History and Physical Interval Note:  06/14/2021 2:15 PM  Rebecca [name] Alert  has presented today for surgery, with the diagnosis of heart failure - angina.  The various methods of treatment have been discussed with the patient and family. After consideration of risks, benefits and other options for treatment, the patient has consented to  Procedure(s): RIGHT/LEFT HEART CATH AND CORONARY ANGIOGRAPHY (N/A) as a surgical intervention.  The patient's history has been reviewed, patient examined, no change in status, stable for surgery.  I have reviewed the patient's chart and labs.  Questions were answered to the patient's satisfaction.    Cath Lab Visit (complete for each Cath Lab visit)  Clinical Evaluation Leading to the Procedure:   ACS: No.  Non-ACS:    Anginal Classification: CCS III  Anti-ischemic medical therapy: Minimal Therapy (1 class of medications)  Non-Invasive Test Results: Intermediate-risk stress test findings: cardiac mortality 1-3%/year  Prior CABG: No previous CABG       Theron Arista [facility] 06/14/2021 2:15 PM

## 2021-06-14 NOTE — Plan of Care (Signed)
  Problem: Education: Goal: Knowledge of General Education information will improve Description: Including pain rating scale, medication(s)/side effects and non-pharmacologic comfort measures Outcome: Progressing   Problem: Health Behavior/Discharge Planning: Goal: Ability to manage health-related needs will improve Outcome: Progressing   Problem: Clinical Measurements: Goal: Ability to maintain clinical measurements within normal limits will improve Outcome: Progressing Goal: Will remain free from infection Outcome: Progressing Goal: Diagnostic test results will improve Outcome: Progressing Goal: Respiratory complications will improve Outcome: Progressing Goal: Cardiovascular complication will be avoided Outcome: Progressing   Problem: Activity: Goal: Risk for activity intolerance will decrease Outcome: Progressing   Problem: Nutrition: Goal: Adequate nutrition will be maintained Outcome: Progressing   Problem: Coping: Goal: Level of anxiety will decrease Outcome: Progressing   Problem: Elimination: Goal: Will not experience complications related to bowel motility Outcome: Progressing Goal: Will not experience complications related to urinary retention Outcome: Progressing   Problem: Pain Managment: Goal: General experience of comfort will improve Outcome: Progressing   Problem: Safety: Goal: Ability to remain free from injury will improve Outcome: Progressing   Problem: Skin Integrity: Goal: Risk for impaired skin integrity will decrease Outcome: Progressing   Problem: Cardiovascular: Goal: Ability to achieve and maintain adequate cardiovascular perfusion will improve Outcome: Progressing Goal: Vascular access site(s) Level 0-1 will be maintained Outcome: Progressing   

## 2021-06-14 NOTE — Consult Note (Signed)
NAME:  Rebecca [name], MRN:  [id number], DOB:  [birth year], LOS: 0 ADMISSION DATE:  06/14/2021, CONSULTATION DATE:  06/14/2021 REFERRING MD:  Dr. [name], CHIEF COMPLAINT:  Acute hypoxic respiratory failure    History of Present Illness:  Rebecca [name] is a 80 y.o. female with a PMH significant for Severe COPD, CHF, diabetes, HLD, and HTN who presented for elective heart cath with Dr. [name] 12/1. Cardiac cath revealed no severe stenosis but patient developed complete heart block during cath that resulted in need for temporary pacemaker placement. Reports 5L O2 requirement at baseline.  Several hours post procedure patient started to develop severe SOB with inability to lie flat (needed for pacemaker placement). Given pulmonary history PCCM called for pulmonary consult.   Pertinent  Medical History  Severe COPD CHF Diabetes HLD HTN  Significant Hospital Events: Including procedures, antibiotic start and stop dates in addition to other pertinent events   12/1 admitted for elective heart cath, devloped CHB during case leading to need for temporary pacemaker placement   Interim History / Subjective:  As above  Subjective: "I cant breath" "I feel short of breath", denies chest pain, denies pain  Objective   Blood pressure 134/83, pulse 100, temperature 98.3 F (36.8 C), temperature source Oral, resp. rate (!) 34, height 5\' 2"  (1.575 m), weight 71.1 kg, SpO2 95 %.       No intake or output data in the 24 hours ending 06/14/21 1830 Filed Weights   06/14/21 1029  Weight: 71.1 kg    Examination: General: In bed, some distress, uncomfortable HEENT: MM pink/moist, anicteric, atraumatic Neuro: RASS +1, PERRL 12mm, GCS 15T CV: S1S2, Paced, no m/r/g appreciated PULM:  diminished in the upper lobes, diminished in the lower lobes, trachea midline, chest expansion symmetric GI: soft, bsx4 active, non-tender   Extremities: warm/dry, no pretibial edema, capillary refill less than 3 seconds   Skin:  no rashes or lesions noted    Resolved Hospital Problem list     Assessment & Plan:  Acute on chronic hypoxic and hypercapnic respiratory failure  -ABG 7.3 / 91 / 146 / 45.5. Weaned from 100% NRB to [facility] Severe COPD -Required 4-6L of oxygen at baseline  -Spirometry 01/2018 showed severe airway obstruction with ratio 43, FEV1 of 0.57-31% and FVC of 53%. 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  on ECHO 08/18/2020 P: Primary management per cardiology  Pacemaker management per cardiology Continuous telemetry  Heart health diet with sodium restriction when able  Strict intake and output  Daily weight to assess volume status Daily assessment for need to diurese with the use of IV lasix   Closely monitor renal function and electrolytes  Supplemental oxygen as above  Ensure hemodynamic control    Best Practice (right click and "Reselect all SmartList Selections" daily)   Diet/type: Regular consistency (see orders) DVT prophylaxis: SCD GI prophylaxis: N/A Lines: Central line Foley:  N/A Code Status:  full code Last date of multidisciplinary goals of care discussion: Per Primary   Labs   CBC:  Recent Labs  Lab 06/14/21 1437  HGB 11.2*  HCT 33.0*    Basic Metabolic Panel: Recent Labs  Lab 06/14/21 1437  NA 138  K 3.7   GFR: Estimated Creatinine Clearance: 51.8 mL/min (by C-G formula based on SCr of 0.74 mg/dL). No results for input(s): PROCALCITON, WBC, LATICACIDVEN in the last 168 hours.  Liver Function Tests: No results for input(s): AST, ALT,  ALKPHOS, BILITOT, PROT, ALBUMIN in the last 168 hours. No results for input(s): LIPASE, AMYLASE in the last 168 hours. No results for input(s): AMMONIA in the last 168 hours.  ABG    Component Value Date/Time   PHART 7.304 (L) 06/14/2021 1437   PCO2ART 91.0 (HH) 06/14/2021 1437   PO2ART 146 (H) 06/14/2021 1437   HCO3 45.2 (H) 06/14/2021 1437   TCO2 48 (H) 06/14/2021 1437   O2SAT 99.0 06/14/2021 1437     Coagulation Profile: No results for input(s): INR, PROTIME in the last 168 hours.  Cardiac Enzymes: No results for input(s): CKTOTAL, CKMB, CKMBINDEX, TROPONINI in the last 168 hours.  HbA1C: No results found for: HGBA1C  CBG: Recent Labs  Lab 06/14/21 1033 06/14/21 1639  GLUCAP 123* 191*    Review of Systems:   Positives in bold  Gen: fever, chills, weight change, fatigue, night sweats HEENT:  blurred vision, double vision, hearing loss, tinnitus, sinus congestion, rhinorrhea, sore throat, neck stiffness, dysphagia PULM:  shortness of breath, cough, sputum production, hemoptysis, wheezing CV: chest pain, edema, orthopnea, paroxysmal nocturnal dyspnea, palpitations GI:  abdominal pain, nausea, vomiting, diarrhea, hematochezia, melena, constipation, change in bowel habits GU: dysuria, hematuria, polyuria, oliguria, urethral discharge Endocrine: hot or cold intolerance, polyuria, polyphagia or appetite change Derm: rash, dry skin, scaling or peeling skin change Heme: easy bruising, bleeding, bleeding gums Neuro: headache, numbness, weakness, slurred speech, loss of memory or consciousness   Past Medical History:  She,  has a past medical history of Congestive heart disease ([place]), COPD (chronic obstructive pulmonary disease) ([place]), Diabetes ([place]), Hyperlipemia, Osteoporosis, and Primary hypertension.   Surgical History:  No past surgical history on file.   Social History:   reports that she quit smoking about 19 years ago. Her smoking use included cigarettes. She has  a 60.00 pack-year smoking history. She has never used smokeless tobacco. She reports current alcohol use of about 1.0 standard drink per week. She reports that she does not use drugs.   Family History:  Her family history is not on file.   Allergies Allergies  Allergen Reactions   Gabapentin Other (See Comments)    Affects breathing   Pravastatin Sodium Other (See Comments)    Muscle aches    Rosuvastatin Calcium     Muscle Aches    Simvastatin     Muscle aches    Amoxicillin Rash   Lisinopril Rash   Sulfacetamide Rash   Tetracyclines & Related Rash     Home Medications  Prior to Admission medications   Medication Sig Start Date End Date Taking? Authorizing Provider  acetaminophen (TYLENOL) 500 MG tablet Take 1,000 mg by mouth 2 (two) times daily as needed for mild pain or headache.   Yes [provider]  albuterol (PROVENTIL) (2.5 MG/3ML) 0.083% nebulizer solution Take 3 mLs (2.5 mg total) by nebulization in the morning, at noon, in the evening, and at bedtime. 06/05/21 07/05/21 Yes Parrett, Tammy S, NP  albuterol (VENTOLIN HFA) 108 (90 Base) MCG/ACT inhaler Inhale 1-2 puffs into the lungs every 6 (six) hours as needed for  wheezing or shortness of breath. 12/27/19  Yes Oretha Milch, MD  arformoterol (BROVANA) 15 MCG/2ML NEBU USE 1 VIAL  IN  NEBULIZER TWICE  DAILY - morning and evening 06/08/21  Yes Parrett, Tammy S, NP  brimonidine (ALPHAGAN) 0.2 % ophthalmic solution Place 1 drop into the left eye 2 (two) times daily. 11/21/20  Yes [provider]  budesonide (PULMICORT) 0.5 MG/2ML nebulizer solution USE 1 VIAL  IN  NEBULIZER TWICE  DAILY - Rinse mouth after treatment 06/08/21  Yes Parrett, Tammy S, NP  Calcium Carbonate-Vitamin D 600-200 MG-UNIT TABS Take 1 tablet by mouth daily.    Yes [provider]  carboxymethylcellulose (REFRESH PLUS) 0.5 % SOLN Place 1 drop into both eyes 3 (three) times daily as needed (dry eyes).   Yes [provider]   carvedilol (COREG) 25 MG tablet Take 25 mg by mouth 2 (two) times daily.  01/21/18  Yes [provider]  docusate sodium (COLACE) 100 MG capsule Take 100 mg by mouth daily.   Yes [provider]  estradiol (ESTRACE) 0.1 MG/GM vaginal cream Place 1 Applicatorful vaginally every other day.  09/09/18  Yes [provider]  furosemide (LASIX) 20 MG tablet Take 1 tablet (20 mg total) by mouth daily. Take extra 1 tablet by mouth daily as needed for edema. 05/03/21  Yes Weaver, Scott T, PA-C  ipratropium (ATROVENT) 0.02 % nebulizer solution Take 2.5 mLs (0.5 mg total) by nebulization 4 (four) times daily. 06/01/21 07/01/21 Yes Parrett, Tammy S, NP  losartan (COZAAR) 50 MG tablet Take 1 tablet (50 mg total) by mouth daily. 05/31/21  Yes Weaver, Scott T, PA-C  metFORMIN (GLUCOPHAGE) 500 MG tablet Take 500 mg by mouth daily with supper.  01/21/18  Yes [provider]  Multiple Vitamins-Minerals (EYE VITAMINS PO) Take 1 tablet by mouth daily.   Yes [provider]  predniSONE (DELTASONE) 10 MG tablet 2 daily for 3 days then 1 tablet daily 06/05/21  Yes Parrett, Virgel Bouquet, NP     Critical care time: N/A    Gershon Mussel., MSN, APRN, AGACNP-BC [facility]  06/14/2021 , 7:05 PM  Please see [email or web address] for pager details  If no response, please call [phone] After hours, please call Elink at [phone]

## 2021-06-14 NOTE — Progress Notes (Signed)
   Called by RN - pt back from cath lab and needs to be flat due to temp pacer in groin.  She has non obstructive disease on cath. With cath unable to measure PA pressure due to CHB, so temp pacer placed.  So now needs to be flat.  She is very SOB and I ordered PCXR stat, and 40 mg IV lasix.  I called CCM to see as well, pt with severe COPD and home 02.

## 2021-06-14 NOTE — Telephone Encounter (Signed)
Patient with end stage COPD , on 4-6 L of O2 , on Duoneb for years .   Let be clear per DME/MCR rules for coverage she is not longer at 83yrs of age able to take her Duoneb Four times a day  .  If that is correct they are denying her maintenance care.   Rec: Albuterol per DME/MCR rules , once daily   Please call local pharmacy and check to see how much albuterol every 6hr would be for through [facility] pricing .   Please let me know what the outcome is .

## 2021-06-14 NOTE — Pre-Procedure Instructions (Signed)
Spoke with [facility].  Authorization # for heart cath [id number].  They faxed the form will place in paper chart.
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
## 2021-06-15 ENCOUNTER — Other Ambulatory Visit (HOSPITAL_COMMUNITY): Payer: Self-pay

## 2021-06-15 ENCOUNTER — Telehealth: Payer: Self-pay

## 2021-06-15 DIAGNOSIS — J9622 Acute and chronic respiratory failure with hypercapnia: Secondary | ICD-10-CM | POA: Diagnosis not present

## 2021-06-15 DIAGNOSIS — I442 Atrioventricular block, complete: Secondary | ICD-10-CM | POA: Diagnosis not present

## 2021-06-15 DIAGNOSIS — J9611 Chronic respiratory failure with hypoxia: Secondary | ICD-10-CM | POA: Diagnosis not present

## 2021-06-15 DIAGNOSIS — E119 Type 2 diabetes mellitus without complications: Secondary | ICD-10-CM | POA: Diagnosis not present

## 2021-06-15 DIAGNOSIS — I110 Hypertensive heart disease with heart failure: Secondary | ICD-10-CM | POA: Diagnosis not present

## 2021-06-15 DIAGNOSIS — J449 Chronic obstructive pulmonary disease, unspecified: Secondary | ICD-10-CM | POA: Diagnosis not present

## 2021-06-15 DIAGNOSIS — I428 Other cardiomyopathies: Secondary | ICD-10-CM

## 2021-06-15 DIAGNOSIS — Z20822 Contact with and (suspected) exposure to covid-19: Secondary | ICD-10-CM | POA: Diagnosis not present

## 2021-06-15 DIAGNOSIS — R0609 Other forms of dyspnea: Secondary | ICD-10-CM | POA: Diagnosis not present

## 2021-06-15 DIAGNOSIS — I2510 Atherosclerotic heart disease of native coronary artery without angina pectoris: Secondary | ICD-10-CM | POA: Diagnosis not present

## 2021-06-15 DIAGNOSIS — I509 Heart failure, unspecified: Secondary | ICD-10-CM | POA: Diagnosis not present

## 2021-06-15 LAB — BASIC METABOLIC PANEL
Anion gap: 7 (ref 5–15)
BUN: 17 mg/dL (ref 8–23)
CO2: 39 mmol/L — ABNORMAL HIGH (ref 22–32)
Calcium: 8.9 mg/dL (ref 8.9–10.3)
Chloride: 93 mmol/L — ABNORMAL LOW (ref 98–111)
Creatinine, Ser: 0.86 mg/dL (ref 0.44–1.00)
GFR, Estimated: >60 mL/min (ref >60)
Glucose, Bld: 122 mg/dL — ABNORMAL HIGH (ref 70–99)
Potassium: 4.2 mmol/L (ref 3.5–5.1)
Sodium: 139 mmol/L (ref 135–145)

## 2021-06-15 LAB — CBC WITH DIFFERENTIAL/PLATELET
Abs Immature Granulocytes: 0.05 10*3/uL (ref 0.00–0.07)
Basophils Absolute: 0 10*3/uL (ref 0.0–0.1)
Basophils Relative: 0 %
Eosinophils Absolute: 0 10*3/uL (ref 0.0–0.5)
Eosinophils Relative: 0 %
HCT: 33.1 % — ABNORMAL LOW (ref 36.0–46.0)
Hemoglobin: 10.2 g/dL — ABNORMAL LOW (ref 12.0–15.0)
Immature Granulocytes: 0 %
Lymphocytes Relative: 8 %
Lymphs Abs: 0.9 10*3/uL (ref 0.7–4.0)
MCH: 32.3 pg (ref 26.0–34.0)
MCHC: 30.8 g/dL (ref 30.0–36.0)
MCV: 104.7 fL — ABNORMAL HIGH (ref 80.0–100.0)
Monocytes Absolute: 0.9 10*3/uL (ref 0.1–1.0)
Monocytes Relative: 7 %
Neutro Abs: 9.9 10*3/uL — ABNORMAL HIGH (ref 1.7–7.7)
Neutrophils Relative %: 85 %
Platelets: 213 10*3/uL (ref 150–400)
RBC: 3.16 MIL/uL — ABNORMAL LOW (ref 3.87–5.11)
RDW: 12.9 % (ref 11.5–15.5)
WBC: 11.8 10*3/uL — ABNORMAL HIGH (ref 4.0–10.5)
nRBC: 0 % (ref 0.0–0.2)

## 2021-06-15 LAB — MAGNESIUM: Magnesium: 1.7 mg/dL (ref 1.7–2.4)

## 2021-06-15 LAB — GLUCOSE, CAPILLARY
Glucose-Capillary: 112 mg/dL — ABNORMAL HIGH (ref 70–99)
Glucose-Capillary: 134 mg/dL — ABNORMAL HIGH (ref 70–99)

## 2021-06-15 MED ORDER — METFORMIN HCL 500 MG PO TABS: 500.0000 mg | ORAL_TABLET | Freq: Every day | ORAL | Status: DC

## 2021-06-15 MED ORDER — PREDNISONE 20 MG PO TABS
40.0000 mg | ORAL_TABLET | Freq: Every day | ORAL | 0 refills | Status: DC
Start: 2021-06-16 — End: 2021-06-19
  Filled 2021-06-15: qty 10, 5d supply, fill #0

## 2021-06-15 MED ORDER — MAGNESIUM SULFATE 2 GM/50ML IV SOLN
2.0000 g | Freq: Once | INTRAVENOUS | Status: AC
  Administered 2021-06-15: 2 g via INTRAVENOUS
  Filled 2021-06-15: qty 50

## 2021-06-15 MED ORDER — ALBUTEROL SULFATE (2.5 MG/3ML) 0.083% IN NEBU
2.5000 mg | INHALATION_SOLUTION | Freq: Four times a day (QID) | RESPIRATORY_TRACT | 0 refills | Status: DC
Start: 2021-06-15 — End: 2021-06-26

## 2021-06-15 MED ORDER — ATROPINE SULFATE 1 MG/10ML IJ SOSY
PREFILLED_SYRINGE | INTRAMUSCULAR | Status: AC
  Filled 2021-06-15: qty 10

## 2021-06-15 MED ORDER — EZETIMIBE 10 MG PO TABS
10.0000 mg | ORAL_TABLET | Freq: Every day | ORAL | Status: DC
  Administered 2021-06-15: 10 mg via ORAL
  Filled 2021-06-15: qty 1

## 2021-06-15 MED ORDER — PREDNISONE 10 MG PO TABS
10.0000 mg | ORAL_TABLET | Freq: Every day | ORAL | 0 refills | Status: DC
  Filled 2021-06-15: qty 45, 45d supply, fill #0

## 2021-06-15 MED ORDER — EZETIMIBE 10 MG PO TABS
10.0000 mg | ORAL_TABLET | Freq: Every day | ORAL | 3 refills | Status: DC
  Filled 2021-06-15: qty 90, 90d supply, fill #0

## 2021-06-15 MED FILL — Heparin Sod (Porcine)-NaCl IV Soln 1000 Unit/500ML-0.9%: INTRAVENOUS | Qty: 1000 | Status: AC

## 2021-06-15 NOTE — Discharge Summary (Addendum)
Discharge Summary    Patient ID: ELFREDA MALBROUGH MRN: [id number]; DOB: 21-Jun-1941  Admit date: 06/14/2021 Discharge date: 06/15/2021  PCP:  London Pepper, MD   [facility] Providers Cardiologist:  Freada Bergeron, MD   Discharge Diagnoses    Principal Problem:   Dyspnea on minimal exertion Active Problems:   Chronic respiratory failure with hypoxia ([place])   Nonischemic cardiomyopathy ([place])   Heart block AV complete [facility])    Diagnostic Studies/Procedures    Right and left heart cath 06/14/21:   Dist LAD lesion is 30% stenosed.   Mid RCA lesion is 40% stenosed.   LV end diastolic pressure is normal.   There is no aortic valve stenosis.   Nonobstructive CAD Normal LV filling pressures with EDP 15 mm Hg Normal RA pressure. Mildly elevated RV pressure 48/5 mm Hg. Unable to measure PA pressure due to development of complete heart block. 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   Plan: will admit to unit for continued monitoring. Continue transvenous pacing. Hold beta blocker.  _____________   History of Present Illness     JONIA TUROWSKI is a 80 y.o. female with with a hx of heart failure with mildly reduced LVEF, NICM (viral CM 2010 EF returned to 40-45%), coronary artery calcification on CT, DM2, COPD, HTN, HLD presents today for discussion of cardiac catheterization.   Kambry Aimone is a 80 y.o. female with a hx of heart failure with mildly reduced LVEF, NICM (viral CM 2010, 08/2020 EF 40-45%), coronary artery calcification on CT, DM2, COPD, HTN, HLD last seen 04/10/21 by Richardson Dopp, PA.   Myoview 07/2020 with LVEF 46%, septal movement consistent with a left bundle branch block, no new ischemia.  Subsequent echo 08/2020 LVEF 40 to 45% with anterior septal and inferior septal hypokinesis, grade 1 diastolic dysfunction.  She was last seen 04/10/2021 by Richardson Dopp.  She continues to prefer initial medical management the cardiac  catheterization was discussed in detail.  She has since sent to MyChart message that she is interested in cardiac catheterization.   She presents today for follow-up.  She was seen earlier today by pulmonology and was started on prednisone.  She notes her dyspnea on exertion with minimal activity is progressed despite 3 L of home oxygen.  Reports occasional chest discomfort with her exertional dyspnea.  The chest pain is midsternal and resolves by rest.  No edema, orthopnea, PND.  Endorses adhering to low-sodium diet and fluid restriction.  She is interested in pursuing cardiac catheterization for further diagnostic benefit.  Hospital Course     Consultants: PCCM  CAD Due to [facility] and reduced EF, angiography was scheduled. Right and left heart cath 06/14/21 showed nonobstructive CAD, no intervention. Will hold BB given transient CHB. Continue all other home medications.    Transient CHB Known LBBB During right heart cath, catheter may have interfered with her right bundle causing transient CHB. Atropine and epinephrine were given and temporary pacing wire was placed. This morning, underlying rhythm is sinus. Temp pacer removed. Coreg was held at discharge. No monitor at this time.    COPD Chronic respiratory failure Started on 5 days of prednisone by PCCM, on 3L home O2 Follow up with pulmonology    Hypertension Continue home medications with the exception of coreg. Will hold coreg for now.   Hyperlipidemia LDL < 70 Statin intolerance. Will need lipid clinic for consideration of PCSK9i. Will start zetia prior to discharge.  Pt seen and examined by Dr. Irish Lack and deemed stable for discharge. Follow up has been arranged. Message sent to [facility] pool.    Did the patient have an acute coronary syndrome (MI, NSTEMI, STEMI, etc) this admission?:  No                               Did the patient have a percutaneous coronary intervention (stent / angioplasty)?:  No.        The patient  will be scheduled for a TOC follow up appointment in 7 days.  A message has been sent to the [facility] and Scheduling Pool at the office where the patient should be seen for follow up.  _____________  Discharge Vitals Blood pressure (!) 121/105, pulse 89, temperature 97.8 F (36.6 C), temperature source Oral, resp. rate (!) 23, height 5\' 2"  (1.575 m), weight 71.1 kg, SpO2 95 %.  Filed Weights   06/14/21 1029  Weight: 71.1 kg    Labs & Radiologic Studies    CBC Recent Labs    06/14/21 1437 06/15/21 0237  WBC  --  11.8*  NEUTROABS  --  9.9*  HGB 11.2* 10.2*  HCT 33.0* 33.1*  MCV  --  104.7*  PLT  --  123456   Basic Metabolic Panel Recent Labs    06/14/21 1437 06/15/21 0237  NA 138 139  K 3.7 4.2  CL  --  93*  CO2  --  39*  GLUCOSE  --  122*  BUN  --  17  CREATININE  --  0.86  CALCIUM  --  8.9  MG  --  1.7   Liver Function Tests No results for input(s): AST, ALT, ALKPHOS, BILITOT, PROT, ALBUMIN in the last 72 hours. No results for input(s): LIPASE, AMYLASE in the last 72 hours. High Sensitivity Troponin:   No results for input(s): TROPONINIHS in the last 720 hours.  BNP Invalid input(s): POCBNP D-Dimer No results for input(s): DDIMER in the last 72 hours. Hemoglobin A1C No results for input(s): HGBA1C in the last 72 hours. Fasting Lipid Panel No results for input(s): CHOL, HDL, LDLCALC, TRIG, CHOLHDL, LDLDIRECT in the last 72 hours. Thyroid Function Tests No results for input(s): TSH, T4TOTAL, T3FREE, THYROIDAB in the last 72 hours.  Invalid input(s): FREET3 _____________  CT Angio Chest Pulmonary Embolism (PE) W or WO Contrast  Result Date: 06/11/2021 CLINICAL DATA:  Shortness of breath. Elevated D-dimer. Previous tobacco abuse. EXAM: CT ANGIOGRAPHY CHEST WITH CONTRAST TECHNIQUE: Multidetector CT imaging of the chest was performed using the standard protocol during bolus administration of intravenous contrast. Multiplanar CT image reconstructions and MIPs were  obtained to evaluate the vascular anatomy. CONTRAST:  55mL OMNIPAQUE IOHEXOL 350 MG/ML SOLN COMPARISON:  01/12/2020 FINDINGS: Cardiovascular: Heart size normal. No pericardial effusion. The RV is nondilated. Satisfactory opacification of pulmonary arteries noted, and there is no evidence of pulmonary emboli. Scattered coronary calcifications. Adequate contrast opacification of the thoracic aorta with no evidence of dissection, aneurysm, or stenosis. There is classic 3-vessel brachiocephalic arch anatomy without proximal stenosis. Scattered calcified plaque in the arch and descending thoracic aorta as well as in the proximal visualized nondilated abdominal aorta. Mediastinum/Nodes: No mediastinal hematoma, mass or adenopathy. Small hiatal hernia. Lungs/Pleura: No pleural effusion. No pneumothorax. Pulmonary emphysema most marked in the apices. Fluid/Debris in segmental subsegmental airways in the posterior right lower lobe. No nodule or airspace infiltrate. Upper Abdomen: No acute findings. Stable  bilateral low-attenuation renal lesions largest 3.1 cm fluid attenuation mid left lesion possibly cysts. Musculoskeletal: No chest wall abnormality. No acute or significant osseous findings. Review of the MIP images confirms the above findings. IMPRESSION: 1. Negative for acute PE or thoracic aortic dissection. 2. Coronary and aortic Atherosclerosis (ICD10-I70.0) 3. Emphysema (ICD10-J43.9). Electronically Signed   By: Corlis Leak M.D.   On: 06/11/2021 11:04   CARDIAC CATHETERIZATION  Result Date: 06/14/2021   Dist LAD lesion is 30% stenosed.   Mid RCA lesion is 40% stenosed.   LV end diastolic pressure is normal.   There is no aortic valve stenosis. Nonobstructive CAD Normal LV filling pressures with EDP 15 mm Hg Normal RA pressure. Mildly elevated RV pressure 48/5 mm Hg. Unable to measure PA pressure due to development of complete heart block. 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  Plan: will admit to unit for continued monitoring. Continue transvenous pacing. Hold beta blocker.   DG CHEST PORT 1 VIEW  Result Date: 06/14/2021 CLINICAL DATA:  Shortness of breath. EXAM: PORTABLE CHEST 1 VIEW COMPARISON:  Chest x-ray 01/12/2020. FINDINGS: The aorta is ectatic. Cardiac silhouette is within normal limits. There hazy, patchy and interstitial opacities in the bilateral lower lobes, left greater than right. There is no pleural effusion or pneumothorax. No acute fractures are seen. IMPRESSION: 1. Bibasilar infiltrates, left greater than right. Findings may be related to infection and or edema. Electronically Signed   By: Darliss Cheney M.D.   On: 06/14/2021 19:08   Disposition   Pt is being discharged home today in good condition.  Follow-up Plans & Appointments     Follow-up Information     Beatrice Lecher, PA-C Follow up on 06/19/2021.   Specialties: Cardiology, Physician Assistant Why: 10:55 am for [facility] post cath Contact information: [address] [place] [address] [phone]                Discharge Instructions     Diet - low sodium heart healthy   Complete by: As directed    Discharge instructions   Complete by: As directed    No driving until seen at follow up. No lifting over 5 lbs for 1 week. No sexual activity for 1 week. Keep procedure site clean & dry. If you notice increased pain, swelling, bleeding or pus, call/return!  You may shower, but no soaking baths/hot tubs/pools for 1 week.   Increase activity slowly   Complete by: As directed        Discharge Medications   Allergies as of 06/15/2021       Reactions   Gabapentin Other (See Comments)   Affects breathing   Pravastatin Sodium Other (See Comments)   Muscle aches    Rosuvastatin Calcium    Muscle Aches    Simvastatin    Muscle aches    Amoxicillin Rash   Lisinopril Rash   Sulfacetamide Rash   Tetracyclines & Related Rash        Medication List      STOP taking these medications    carvedilol 25 MG tablet Commonly known as: COREG       TAKE these medications    acetaminophen 500 MG tablet Commonly known as: TYLENOL Take 1,000 mg by mouth 2 (two) times daily as needed for mild pain or headache.   albuterol 108 (90 Base) MCG/ACT inhaler Commonly known as: VENTOLIN HFA Inhale 1-2 puffs into the lungs every 6 (six)  hours as needed for wheezing or shortness of breath.   albuterol (2.5 MG/3ML) 0.083% nebulizer solution Commonly known as: PROVENTIL Take 3 mLs (2.5 mg total) by nebulization in the morning, at noon, in the evening, and at bedtime.   arformoterol 15 MCG/2ML Nebu Commonly known as: BROVANA USE 1 VIAL  IN  NEBULIZER TWICE  DAILY - morning and evening   brimonidine 0.2 % ophthalmic solution Commonly known as: ALPHAGAN Place 1 drop into the left eye 2 (two) times daily.   budesonide 0.5 MG/2ML nebulizer solution Commonly known as: PULMICORT USE 1 VIAL  IN  NEBULIZER TWICE  DAILY - Rinse mouth after treatment   Calcium Carbonate-Vitamin D 600-200 MG-UNIT Tabs Take 1 tablet by mouth daily.   carboxymethylcellulose 0.5 % Soln Commonly known as: REFRESH PLUS Place 1 drop into both eyes 3 (three) times daily as needed (dry eyes).   docusate sodium 100 MG capsule Commonly known as: COLACE Take 100 mg by mouth daily.   estradiol 0.1 MG/GM vaginal cream Commonly known as: ESTRACE Place 1 Applicatorful vaginally every other day.   EYE VITAMINS PO Take 1 tablet by mouth daily.   ezetimibe 10 MG tablet Commonly known as: ZETIA Take 1 tablet (10 mg total) by mouth daily.   furosemide 20 MG tablet Commonly known as: LASIX Take 1 tablet (20 mg total) by mouth daily. Take extra 1 tablet by mouth daily as needed for edema.   ipratropium 0.02 % nebulizer solution Commonly known as: ATROVENT Take 2.5 mLs (0.5 mg total) by nebulization 4 (four) times daily.   losartan 50 MG tablet Commonly known as:  COZAAR Take 1 tablet (50 mg total) by mouth daily.   metFORMIN 500 MG tablet Commonly known as: GLUCOPHAGE Take 1 tablet (500 mg total) by mouth daily with supper.   predniSONE 20 MG tablet Commonly known as: DELTASONE Take 2 tablets (40 mg total) by mouth daily with breakfast for 5 days. Start taking on: June 16, 2021 What changed:  medication strength how much to take how to take this when to take this additional instructions   predniSONE 10 MG tablet Commonly known as: DELTASONE Take 1 tablet (10 mg total) by mouth daily with breakfast. Start taking on: June 20, 2021 What changed: You were already taking a medication with the same name, and this prescription was added. Make sure you understand how and when to take each.           Outstanding Labs/Studies   none  Duration of Discharge Encounter   Greater than 30 minutes including physician time.  Signed, Tami Lin Duke, PA 06/15/2021, 10:06 AM   I have examined the patient and reviewed assessment and plan and discussed with patient.  Agree with above as stated.    Complete heart block in the setting of chronic LBBB with right heart cath affecting right bundle. Hold Coreg for now given recent CHB.   No pacing overnight.  TV pacer removed while on rounds.  Nurse to remove venous sheath.  Plan for 2 hours of bed rest.     COPD: 5 L [place] O2 at home 24/7.  Discharge later today.  Larae Grooms

## 2021-06-15 NOTE — Progress Notes (Signed)
NAME:  Rebecca [name], MRN:  [id number], DOB:  07/04/1941, LOS: 0 ADMISSION DATE:  06/14/2021, CONSULTATION DATE:  06/14/2021 REFERRING MD:  Dr. [place], CHIEF COMPLAINT:  Acute hypoxic respiratory failure    History of Present Illness:  Rebecca [name] is a 80 y.o. female with a PMH significant for Severe COPD, CHF, diabetes, HLD, and HTN who presented for elective heart cath with Dr. [place] 12/1. Cardiac cath revealed no severe stenosis but patient developed complete heart block during cath that resulted in need for temporary pacemaker placement. Reports 5L O2 requirement at baseline.  Several hours post procedure patient started to develop severe SOB with inability to lie flat (needed for pacemaker placement). Given pulmonary history PCCM called for pulmonary consult.   Pertinent  Medical History  Severe COPD CHF Diabetes HLD HTN  Significant Hospital Events: Including procedures, antibiotic start and stop dates in addition to other pertinent events   12/1 admitted for elective heart cath, devloped CHB during case leading to need for temporary pacemaker placement. 100% NRB>5LNC.  12/2 Start 40 Pred daily 5 days   Interim History / Subjective:  No acute events overnight  In bed eating breakfast. Poor appetite. Drinking coffee  Subjective: endorses continued SOB. Denies chest pain, nausea Objective   Blood pressure 139/89, pulse 96, temperature 98.1 F (36.7 C), temperature source Oral, resp. rate (!) 22, height 5\' 2"  (1.575 m), weight 71.1 kg, SpO2 100 %.        Intake/Output Summary (Last 24 hours) at 06/15/2021 0644 Last data filed at 06/14/2021 1800 Gross per 24 hour  Intake 71.1 ml  Output --  Net 71.1 ml   Filed Weights   06/14/21 1029  Weight: 71.1 kg    Examination: General: In bed, NAD, appears comfortable HEENT: MM pink/moist, anicteric, atraumatic Neuro: RASS 0, PERRL 4mm, GCS 15 CV: S1S2, NSR, no m/r/g appreciated PULM:  diminished in the upper lobes,  diminished in the lower lobes, trachea midline, chest expansion symmetric GI: soft, bsx4 active, non-tender   Extremities: warm/dry, no pretibial edema, capillary refill less than 3 seconds  Skin: Pacemaker site c/d/I, no rashes or lesions noted  Tmax 98.3 +361Ml, no document UOP, UOP visualized in purewick container. WBC 11.8 HGB 10.2  Resolved Hospital Problem list     Assessment & Plan:  Acute on chronic hypoxic and hypercapnic respiratory failure  Severe COPD Former smoker  -Required 4-6L of oxygen at baseline  -Spirometry 01/2018 showed severe airway obstruction with ratio 43, FEV1 of 0.57-31% and FVC of 53%. -On 6LNC on exam -CXR: bibasilar edema  -Afebrile, suspect leukocytosis is reactive P: Continue morphine 2mg  q4h prn for RR greater than 30 Stop IVF Start prednisone 40mg  daily on 12/2 for 5 days, followed by prednisone 10mg  daily until office follow up.  Continue home bronchodilators: Brovana, pulmicort twice daily, and then Duonebs as needed  SPO2 goal 88-96%. Wean FiO2 to goal. Promote diuresis as able  Head of bed elevated 30 degrees Follow intermittent chest x-ray and ABG  Ensure adequate pulmonary hygiene  When able mobilize   Complete heart block post heart cath  Hx of NICM -EF 40-45% with LV regional wall motion abnormality  on ECHO 08/18/2020 P: Primary management per cardiology Pacemaker management per cardiology Continuous tele Heart health diet with sodium restriction when able  Strict intake and output  Daily weight to assess volume status Daily assessment for need to diurese with the use of IV lasix   Closely monitor renal function and  electrolytes  Supplemental oxygen as above  Ensure hemodynamic control   Anemia, post procedure HGB 10.2, no signs of active bleeding -Transfuse PRBC if HBG less than 7 -Obtain AM CBC to trend H&H -Monitor for signs of bleeding   Best Practice (right click and "Reselect all SmartList Selections" daily)    Diet/type: Regular consistency (see orders) DVT prophylaxis: SCD GI prophylaxis: N/A Lines: Central line Foley:  N/A Code Status:  full code Last date of multidisciplinary goals of care discussion: Per primary    Critical care time: N/A    Gershon Mussel., MSN, APRN, AGACNP-BC [facility]  06/15/2021 , 6:44 AM  Please see [email or web address] for pager details  If no response, please call [phone] After hours, please call Elink at [phone]

## 2021-06-15 NOTE — Telephone Encounter (Signed)
Call made to pharmacy, per pharmacy the order would have to be sent in, they are unable to tell me how much something would cost without the order.   Order sent.   Call made to pharmacy. $2.65 for one month supply. She picked this up 11/23 per pharmacy staff so its too soon to fill anyway.   Call made to [facility], made aware they cancel order for Albuterol.   Nothing further needed at this time.

## 2021-06-15 NOTE — Telephone Encounter (Signed)
**Note De-identified Kalin Amrhein Obfuscation** -----  **Note De-Identified Hadia Minier Obfuscation** Message from Lorelle Formosa Tyner Codner, LPN sent at 53/03/7672 11:23 AM EST -----  ----- Message ----- From: Marcelino Duster, PA Sent: 06/15/2021   9:57 AM EST To: [facility] Toc  Pt will need a TOC phone call please.   Thanks Angie

## 2021-06-15 NOTE — Telephone Encounter (Signed)
Pharm team- are you able to help answer Tammy's question here? Thanks!

## 2021-06-15 NOTE — Progress Notes (Signed)
RN reviewed discharge summary including follow-up visit times/dates and medications with patient and her two daughters. All questions and concerns were answered and addressed. Vitals stable. Patient was discharged via wheelchair and oxygen tank on 5 Liters nasal cannula. Patient was then helped into passenger seat of daughter's car and switched onto her home oxygen tank at 5 Liters. RN observed patient leaving. Patient's vitals remained stable and no event occurred throughout discharge process.

## 2021-06-15 NOTE — Progress Notes (Signed)
Progress Note  Patient Name: Rebecca [name] Date of Encounter: 06/15/2021  [facility] Cardiologist: Meriam Sprague, MD   Subjective   Chronic shortness of breath  Inpatient Medications    Scheduled Meds:  arformoterol  15 mcg Nebulization BID   brimonidine  1 drop Left Eye BID   budesonide  0.5 mg Nebulization BID   calcium-vitamin D  1 tablet Oral Daily   Chlorhexidine Gluconate Cloth  6 each Topical Daily   docusate sodium  100 mg Oral Daily   furosemide  20 mg Oral Daily   ipratropium-albuterol  3 mL Nebulization Q6H   losartan  50 mg Oral Daily   mouth rinse  15 mL Mouth Rinse BID   predniSONE  40 mg Oral Q breakfast   Followed by   Melene Muller ON 06/20/2021] predniSONE  10 mg Oral Q breakfast   sodium chloride flush  3 mL Intravenous Q12H   sodium chloride flush  3 mL Intravenous Q12H   Continuous Infusions:  sodium chloride     PRN Meds: sodium chloride, acetaminophen, albuterol, morphine injection, ondansetron (ZOFRAN) IV, polyvinyl alcohol, sodium chloride flush   Vital Signs    Vitals:   06/15/21 0729 06/15/21 0732 06/15/21 0734 06/15/21 0755  BP:      Pulse:      Resp:      Temp:    97.8 F (36.6 C)  TempSrc:    Oral  SpO2: 95% 92% 95%   Weight:      Height:        Intake/Output Summary (Last 24 hours) at 06/15/2021 0821 Last data filed at 06/15/2021 0700 Gross per 24 hour  Intake 361.1 ml  Output --  Net 361.1 ml   Last 3 Weights 06/14/2021 06/05/2021 06/05/2021  Weight (lbs) 156 lb 12.8 oz 161 lb 158 lb 12.8 oz  Weight (kg) 71.124 kg 73.029 kg 72.031 kg      Telemetry    NSR - Personally Reviewed  ECG      Physical Exam   GEN: No acute distress.   Neck: No JVD Cardiac: RRR, no murmurs, rubs, or gallops.  Respiratory: Clear to auscultation bilaterally. GI: Soft, nontender, non-distended  MS: No edema; No deformity. No right groin hematoma Neuro:  Nonfocal  Psych: Normal affect   Labs    High Sensitivity Troponin:  No  results for input(s): TROPONINIHS in the last 720 hours.   Chemistry Recent Labs  Lab 06/14/21 1437 06/15/21 0237  NA 138 139  K 3.7 4.2  CL  --  93*  CO2  --  39*  GLUCOSE  --  122*  BUN  --  17  CREATININE  --  0.86  CALCIUM  --  8.9  MG  --  1.7  GFRNONAA  --  >60  ANIONGAP  --  7    Lipids No results for input(s): CHOL, TRIG, HDL, LABVLDL, LDLCALC, CHOLHDL in the last 168 hours.  Hematology Recent Labs  Lab 06/14/21 1437 06/15/21 0237  WBC  --  11.8*  RBC  --  3.16*  HGB 11.2* 10.2*  HCT 33.0* 33.1*  MCV  --  104.7*  MCH  --  32.3  MCHC  --  30.8  RDW  --  12.9  PLT  --  213   Thyroid No results for input(s): TSH, FREET4 in the last 168 hours.  BNPNo results for input(s): BNP, PROBNP in the last 168 hours.  DDimer No results for input(s): DDIMER  in the last 168 hours.   Radiology    CARDIAC CATHETERIZATION  Result Date: 06/14/2021   Dist LAD lesion is 30% stenosed.   Mid RCA lesion is 40% stenosed.   LV end diastolic pressure is normal.   There is no aortic valve stenosis. Nonobstructive CAD Normal LV filling pressures with EDP 15 mm Hg Normal RA pressure. Mildly elevated RV pressure 48/5 mm Hg. Unable to measure PA pressure due to development of complete heart block. 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 Plan: will admit to unit for continued monitoring. Continue transvenous pacing. Hold beta blocker.   DG CHEST PORT 1 VIEW  Result Date: 06/14/2021 CLINICAL DATA:  Shortness of breath. EXAM: PORTABLE CHEST 1 VIEW COMPARISON:  Chest x-ray 01/12/2020. FINDINGS: The aorta is ectatic. Cardiac silhouette is within normal limits. There hazy, patchy and interstitial opacities in the bilateral lower lobes, left greater than right. There is no pleural effusion or pneumothorax. No acute fractures are seen. IMPRESSION: 1. Bibasilar infiltrates, left greater than right. Findings may be related to infection and or edema. Electronically  Signed   By: Darliss Cheney M.D.   On: 06/14/2021 19:08    Cardiac Studies   Cath report reviewed  Patient Profile     80 y.o. female with complete heart block, COPD, mildly decreased LVEF.   Assessment & Plan    Complete heart block in the setting of chronic LBBB with right heart cath affecting right bundle. Hold Coreg for now given recent CHB.  No pacing overnight.  TV pacer removed while on rounds.  Nurse to remove venous sheath.  Plan for 2 hours of bed rest.    Discharge later today.  COPD: 5 L [place] O2 at home 24/7.  For questions or updates, please contact [facility] Please consult [email or web address] for contact info under        Signed, Lance Muss, MD  06/15/2021, 8:21 AM

## 2021-06-15 NOTE — Progress Notes (Signed)
[facility] ADULT ICU REPLACEMENT PROTOCOL   The patient does apply for the [facility] Adult ICU Electrolyte Replacment Protocol based on the criteria listed below:   1.Exclusion criteria: TCTS patients, ECMO patients, and Dialysis patients 2. Is GFR >/= 30 ml/min? Yes.    Patient's GFR today is >60 3. Is SCr </= 2? Yes.   Patient's SCr is 0.86 mg/dL 4. Did SCr increase >/= 0.5 in 24 hours? No 5.Pt's weight >40kg  Yes.   6. Abnormal electrolyte(s): mag 1.7  7. Electrolytes replaced per protocol 8.  Call MD STAT for K+ </= 2.5, Phos </= 1, or Mag </= 1 Physician:  n/a  Melvern Banker 06/15/2021 4:03 AM

## 2021-06-15 NOTE — Care Management (Signed)
06-15-21 1106 Case Manager spoke with patient regarding portable oxygen for home. Patient has a portable tank in the room for travel. Patient is from [place] and has Chief Technology Officer for PT. Case Manager called the Liaison [place] with [place] to make her aware that the patient will return today. No further needs from Case Manager at this time.

## 2021-06-17 ENCOUNTER — Other Ambulatory Visit: Payer: Self-pay | Admitting: Pulmonary Disease

## 2021-06-18 ENCOUNTER — Encounter (INDEPENDENT_AMBULATORY_CARE_PROVIDER_SITE_OTHER): Payer: Medicare HMO | Admitting: Ophthalmology

## 2021-06-18 DIAGNOSIS — H532 Diplopia: Secondary | ICD-10-CM

## 2021-06-18 DIAGNOSIS — I5020 Unspecified systolic (congestive) heart failure: Secondary | ICD-10-CM | POA: Insufficient documentation

## 2021-06-18 DIAGNOSIS — I10 Essential (primary) hypertension: Secondary | ICD-10-CM | POA: Insufficient documentation

## 2021-06-18 DIAGNOSIS — E119 Type 2 diabetes mellitus without complications: Secondary | ICD-10-CM

## 2021-06-18 DIAGNOSIS — Z961 Presence of intraocular lens: Secondary | ICD-10-CM

## 2021-06-18 DIAGNOSIS — H35033 Hypertensive retinopathy, bilateral: Secondary | ICD-10-CM

## 2021-06-18 DIAGNOSIS — H401132 Primary open-angle glaucoma, bilateral, moderate stage: Secondary | ICD-10-CM

## 2021-06-18 DIAGNOSIS — H3581 Retinal edema: Secondary | ICD-10-CM

## 2021-06-18 DIAGNOSIS — I2510 Atherosclerotic heart disease of native coronary artery without angina pectoris: Secondary | ICD-10-CM | POA: Insufficient documentation

## 2021-06-18 DIAGNOSIS — I447 Left bundle-branch block, unspecified: Secondary | ICD-10-CM | POA: Insufficient documentation

## 2021-06-18 DIAGNOSIS — H31003 Unspecified chorioretinal scars, bilateral: Secondary | ICD-10-CM

## 2021-06-18 DIAGNOSIS — E785 Hyperlipidemia, unspecified: Secondary | ICD-10-CM | POA: Insufficient documentation

## 2021-06-18 NOTE — Progress Notes (Addendum)
Cardiology Office Note:    Date:  06/19/2021   ID:  [name], DOB Jan 20, 1941, MRN [id number]  PCP:  Farris Has, MD   [facility] Providers Cardiologist:  Meriam Sprague, MD     Referring MD: Farris Has, MD   Chief Complaint:  Hospitalization Follow-up (Follow-up post catheterization)    Patient Profile:   Rebecca [name] is a 80 y.o. female with:  HFmrEF (heart failure with mildly reduced ejection fraction)  Non-ischemic cardiomyopathy  Hx of Viral CM dx in 2010 [facility]) >> EF returned to normal Cath reportedly normal in 2010 EF ? to 40-45 on echocardiogram in 2/22 w ant-sept and inf-sept HK Myoview 1/22: no ischemia, scar; low risk Coronary artery disease  Coronary artery calcification on CT in 6/21 Cath 12/22: Mild-moderate nonobstructive disease LBBB COPD on home O2 Diabetes mellitus  Hypertension  Hyperlipidemia  Frequent UTIs Therefore SGLT2i not started  History of Present Illness: Rebecca [name] was last seen by Dr. Shari Prows in 7/22.  The patient had been followed for worsening dyspnea with exertion.  Echocardiogram earlier this year demonstrated worsening LV function with an EF of 40-45 and anteroseptal, inferoseptal hypokinesis.  Myoview demonstrated no ischemia or scar.  Cardiac catheterization versus medical management was discussed with the patient.  She preferred medical management initially.  When last seen in July, Dr. Shari Prows noted that cardiac catheterization would be considered if symptoms persist.   I saw her in 9/22.  She continued to struggle with shortness of breath.  She did not want to pursue cardiac catheterization yet.  Labs demonstrated significant hyperkalemia.  I stopped her K+ and Spironolactone.  She recently contacted the office and indicated she wanted to proceed with cardiac catheterization.  She was seen by Gillian Shields, NP on 06/05/21.  Cardiac catheterization was arranged and demonstrated non-obstructive  coronary artery disease.  She developed complete heart block during right heart cath and a temp pacer was placed.  CHB was in setting of Left Bundle Branch Block and R hear cath affected right bundle.  She was observed overnight without recurrent CHB.  Her beta-blocker was DC'd and it was decided to continue holding at DC.    She returns for f/u.  She is here with her daughter.  Since DC she has been more short of breath.  She has been coughing up large blood clots.  She has done this several times.  She has associated chest tightness. She has not had syncope.  She has chronic orthopnea without change. She has not had fevers. She has not had significant leg edema.    ASSESSMENT & PLAN:   Hemoptysis She recently underwent R and L cardiac catheterization for evaluation of her cardiomyopathy.  This was complicated by complete heart block.  She did not have a wedge pressure done.  The procedure was aborted before this. She has had increased shortness of breath and hemoptysis since.  She just had a CT done 06/11/21 for an elevated DDimer.  This was neg for pulmonary embolism.   I reviewed her case with Dr. Eden Emms (attending MD).  We do not think her hemoptysis is related to her recent cardiac catheterization or the complications experienced.  With her hx of COPD, she needs evaluation with pulmonology.  I did speak with Rubye Oaks, NP with pulmonology.  She was able to add the pt on at 2pm today.  However, Tammy and I both agree that the pt will likely need admission to the hospital for evaluation.  The pt has opted to go to the ED instead.  I did call triage at [facility] ED to apprise them of her symptoms.    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) Non-ischemic cardiomyopathy.  Recent cath without significant coronary artery disease.  Volume status seems stable at this time. Her LVEDP was normal at her cath.  She was taken off of Carvedilol due to complete heart block during her cardiac  catheterization.  Hopefully, we can resume this at some point.  For now, given her current issue with hemoptysis, I would not re-challenge her at this time.  She could not tolerate spironolactone due to high K+.  She cannot tolerate SGLT2i due to frequent UTIs.  Plan f/u with Dr. Shari Prows in 4-6 weeks.   CAD (coronary artery disease) Non-obstructive coronary artery disease by cath.  I would not start ASA at this time due to current hemoptysis.  Continue ezetimibe.    Hyperlipidemia Continue ezetimibe.  Essential hypertension Blood pressure is elevated today.  This is likely due to her current illness.  Continue losartan 50 mg daily.  Monitor blood pressure at home.  Heart block AV complete (HCC) This occurred during right heart catheterization in the setting of left bundle branch block.  Her beta-blocker was stopped.  She is maintaining sinus rhythm.  We will hold off on rechallenge in her with beta-blocker at this time.  Consider at follow-up.  COPD with chronic bronchitis and emphysema (HCC) I am concerned she is having a COPD exacerbation.  As noted, I did discuss with pulmonology today.  The patient has opted to go to the emergency room for urgent evaluation.           Dispo:  Return in about 6 weeks (around 07/31/2021) for Follow up with Dr. Shari Prows.    Prior CV studies: R/L cardiac catheterization 06/14/21 LAD dist 30 RCA mid 40 LVEDP 15 RA normal. RV 48/5 (mildly elevated) Unable to measure PA pressure due to CHB CHB due to catheter induced block in setting of Left Bundle Branch Block  Echocardiogram 08/18/2020 EF 40-45, anteroseptal and inferoseptal HK, GR 1 DD, normal RVSF   GATED SPECT MYO PERF W/LEXISCAN STRESS 1D 08/07/2020 Narrative 1. There are reduced counts in the septal segments on rest imaging that improve on stress imaging with normal wall motion consistent with left bundle branch block artifact. No evidence of ischemia or infarction. 2. Mildly reduced LVEF,  46%. Septal movement consistent with LBBB. 3. This is an low- to intermediate-risk study.   Chest CTA 01/12/2020 IMPRESSION: 1. No definite evidence of pulmonary embolus. 2. Coronary artery calcifications are noted suggesting coronary artery disease. 3. Small sliding-type hiatal hernia. 4. Bilateral nephrolithiasis.   Aortic Atherosclerosis (ICD10-I70.0) and Emphysema (ICD10-J43.9).     Past Medical History:  Diagnosis Date   Congestive heart disease (HCC)    COPD (chronic obstructive pulmonary disease) (HCC)    Diabetes (HCC)    Hyperlipemia    Osteoporosis    Primary hypertension    Current Medications: Current Meds  Medication Sig   acetaminophen (TYLENOL) 500 MG tablet Take 1,000 mg by mouth 2 (two) times daily as needed for mild pain or headache.   albuterol (PROVENTIL) (2.5 MG/3ML) 0.083% nebulizer solution Take 3 mLs (2.5 mg total) by nebulization in the morning, at noon, in the evening, and at bedtime.   albuterol (VENTOLIN HFA) 108 (90 Base) MCG/ACT inhaler Inhale 1-2 puffs into the lungs every 6 (six) hours as needed for wheezing or shortness  of breath.   arformoterol (BROVANA) 15 MCG/2ML NEBU USE 1 VIAL  IN  NEBULIZER TWICE  DAILY - morning and evening   brimonidine (ALPHAGAN) 0.2 % ophthalmic solution Place 1 drop into the left eye 2 (two) times daily.   budesonide (PULMICORT) 0.5 MG/2ML nebulizer solution USE 1 VIAL  IN  NEBULIZER TWICE  DAILY - Rinse mouth after treatment   Calcium Carbonate-Vitamin D 600-200 MG-UNIT TABS Take 1 tablet by mouth daily.    carboxymethylcellulose (REFRESH PLUS) 0.5 % SOLN Place 1 drop into both eyes 3 (three) times daily as needed (dry eyes).   docusate sodium (COLACE) 100 MG capsule Take 100 mg by mouth daily.   estradiol (ESTRACE) 0.1 MG/GM vaginal cream Place 1 Applicatorful vaginally every other day.    ezetimibe (ZETIA) 10 MG tablet Take 1 tablet (10 mg total) by mouth daily.   furosemide (LASIX) 20 MG tablet Take 1 tablet (20 mg  total) by mouth daily. Take extra 1 tablet by mouth daily as needed for edema.   ipratropium (ATROVENT) 0.02 % nebulizer solution Take 2.5 mLs (0.5 mg total) by nebulization 4 (four) times daily.   losartan (COZAAR) 50 MG tablet Take 1 tablet (50 mg total) by mouth daily.   metFORMIN (GLUCOPHAGE) 500 MG tablet Take 1 tablet (500 mg total) by mouth daily with supper.   Multiple Vitamins-Minerals (EYE VITAMINS PO) Take 1 tablet by mouth daily.   [START ON 06/20/2021] predniSONE (DELTASONE) 10 MG tablet Take 4 tablets (40 mg total) by mouth daily with breakfast for 5 days. Take 1 tablet (10 mg total) by mouth daily with breakfast.    Allergies:   Gabapentin, Pravastatin sodium, Rosuvastatin calcium, Simvastatin, Amoxicillin, Lisinopril, Sulfacetamide, and Tetracyclines & related   Social History   Tobacco Use   Smoking status: Former    Packs/day: 1.50    Years: 40.00    Pack years: 60.00    Types: Cigarettes    Quit date: 2003    Years since quitting: 19.9   Smokeless tobacco: Never  Vaping Use   Vaping Use: Never used  Substance Use Topics   Alcohol use: Yes    Alcohol/week: 1.0 standard drink    Types: 1 Glasses of wine per week   Drug use: Never    Family Hx: The patient's family history is not on file.  Review of Systems  Gastrointestinal:  Positive for constipation.    EKGs/Labs/Other Test Reviewed:    EKG:  EKG is   ordered today.  The ekg ordered today demonstrates NSR, HR 84, left bundle branch block, QTC 460  Recent Labs: 04/10/2021: NT-Pro BNP 179 06/15/2021: BUN 17; Creatinine, Ser 0.86; Hemoglobin 10.2; Magnesium 1.7; Platelets 213; Potassium 4.2; Sodium 139   Recent Lipid Panel No results found for: CHOL, TRIG, HDL, LDLCALC, LDLDIRECT   Risk Assessment/Calculations:          Physical Exam:    VS:  BP (!) 180/80   Pulse 93   Temp 98.6 F (37 C)   Ht 5\' 2"  (1.575 m)   Wt 157 lb (71.2 kg)   SpO2 93%   BMI 28.72 kg/m     Wt Readings from Last 3  Encounters:  06/19/21 157 lb (71.2 kg)  06/14/21 156 lb 12.8 oz (71.1 kg)  06/05/21 161 lb (73 kg)    Constitutional:      Appearance: Healthy appearance. Not in distress.  Pulmonary:     Breath sounds: No wheezing. No rales.  Comments: Decreased breath sounds bilaterally  Cardiovascular:     Normal rate. Regular rhythm. Normal S1. Normal S2.      Murmurs: There is no murmur.  Edema:    Peripheral edema absent.  Abdominal:     Palpations: Abdomen is soft.  Musculoskeletal:     Cervical back: Neck supple. Skin:    General: Skin is warm and dry.  Neurological:     Mental Status: Alert and oriented to person, place and time.     Cranial Nerves: Cranial nerves are intact.       Medication Adjustments/Labs and Tests Ordered: Current medicines are reviewed at length with the patient today.  Concerns regarding medicines are outlined above.  Tests Ordered: Orders Placed This Encounter  Procedures   EKG 12-Lead   Medication Changes: No orders of the defined types were placed in this encounter.  Signed, Tereso Newcomer, PA-C  06/19/2021 12:51 PM    [facility] [address] [place], [place], [place]  [address] Phone: [phone]; Fax: [phone]

## 2021-06-18 NOTE — Telephone Encounter (Addendum)
**Note De-Identified Monterrio Gerst Obfuscation** Patient contacted regarding discharge from [facility] on 06/15/2021  Patient understands to follow up with Tereso Newcomer, PA-c on 06/19/2021 at 10:55 at [address] in [place], [place] [address] Patient understands discharge instructions? Yes Patient understands medications and regiment? Yes Patient understands to bring all medications to this visit? Yes but states that she would rather bring a accurate list of her meds.  Ask patient:  Are you enrolled in My Chart:  Yes  The pt denies CP/discomfort, nausea, diaphoresis but does c/o SOB (this is not new as she has COPD), dizziness (she is aware to have someone assist her when standing up or to be sure she holds onto something to steady her while standing up alone), was coughing up old dark blood but none since early yesterday, and acid reflux.  I have advised her to contact her pulmonologist and PCP concerning her reflux and coughing up old blood and I have asked her to make a list of her concerns to bring with her to her appointment with Lorin Picket tomorrow. She states that she will and she thanked me for my call.

## 2021-06-19 ENCOUNTER — Other Ambulatory Visit: Payer: Self-pay

## 2021-06-19 ENCOUNTER — Inpatient Hospital Stay (HOSPITAL_COMMUNITY)
Admission: EM | Admit: 2021-06-19 | Discharge: 2021-06-26 | DRG: 177 | Disposition: A | Discharge status: Skilled Nursing Facility | Payer: Medicare HMO | Attending: Internal Medicine | Admitting: Internal Medicine

## 2021-06-19 ENCOUNTER — Emergency Department (HOSPITAL_COMMUNITY): Payer: Medicare HMO

## 2021-06-19 ENCOUNTER — Ambulatory Visit: Payer: Medicare HMO | Admitting: Physician Assistant

## 2021-06-19 ENCOUNTER — Encounter: Payer: Self-pay | Admitting: Physician Assistant

## 2021-06-19 ENCOUNTER — Encounter (HOSPITAL_COMMUNITY): Payer: Self-pay

## 2021-06-19 ENCOUNTER — Telehealth: Payer: Self-pay | Admitting: *Deleted

## 2021-06-19 VITALS — BP 180/80 | HR 93 | Temp 98.6°F | Ht 62.0 in | Wt 157.0 lb

## 2021-06-19 DIAGNOSIS — E669 Obesity, unspecified: Secondary | ICD-10-CM | POA: Diagnosis present

## 2021-06-19 DIAGNOSIS — H409 Unspecified glaucoma: Secondary | ICD-10-CM | POA: Diagnosis present

## 2021-06-19 DIAGNOSIS — J9621 Acute and chronic respiratory failure with hypoxia: Secondary | ICD-10-CM | POA: Diagnosis not present

## 2021-06-19 DIAGNOSIS — I10 Essential (primary) hypertension: Secondary | ICD-10-CM | POA: Diagnosis not present

## 2021-06-19 DIAGNOSIS — R0602 Shortness of breath: Secondary | ICD-10-CM | POA: Diagnosis not present

## 2021-06-19 DIAGNOSIS — E876 Hypokalemia: Secondary | ICD-10-CM | POA: Diagnosis present

## 2021-06-19 DIAGNOSIS — I428 Other cardiomyopathies: Secondary | ICD-10-CM | POA: Diagnosis present

## 2021-06-19 DIAGNOSIS — J432 Centrilobular emphysema: Secondary | ICD-10-CM | POA: Diagnosis not present

## 2021-06-19 DIAGNOSIS — I442 Atrioventricular block, complete: Secondary | ICD-10-CM | POA: Diagnosis not present

## 2021-06-19 DIAGNOSIS — Z79890 Hormone replacement therapy (postmenopausal): Secondary | ICD-10-CM

## 2021-06-19 DIAGNOSIS — I2510 Atherosclerotic heart disease of native coronary artery without angina pectoris: Secondary | ICD-10-CM

## 2021-06-19 DIAGNOSIS — J690 Pneumonitis due to inhalation of food and vomit: Principal | ICD-10-CM | POA: Diagnosis present

## 2021-06-19 DIAGNOSIS — Z20822 Contact with and (suspected) exposure to covid-19: Secondary | ICD-10-CM | POA: Diagnosis not present

## 2021-06-19 DIAGNOSIS — J189 Pneumonia, unspecified organism: Secondary | ICD-10-CM | POA: Insufficient documentation

## 2021-06-19 DIAGNOSIS — J9600 Acute respiratory failure, unspecified whether with hypoxia or hypercapnia: Secondary | ICD-10-CM | POA: Diagnosis not present

## 2021-06-19 DIAGNOSIS — J310 Chronic rhinitis: Secondary | ICD-10-CM | POA: Diagnosis present

## 2021-06-19 DIAGNOSIS — R0902 Hypoxemia: Secondary | ICD-10-CM | POA: Diagnosis not present

## 2021-06-19 DIAGNOSIS — E785 Hyperlipidemia, unspecified: Secondary | ICD-10-CM | POA: Diagnosis not present

## 2021-06-19 DIAGNOSIS — Z79899 Other long term (current) drug therapy: Secondary | ICD-10-CM

## 2021-06-19 DIAGNOSIS — E119 Type 2 diabetes mellitus without complications: Secondary | ICD-10-CM | POA: Diagnosis present

## 2021-06-19 DIAGNOSIS — Z743 Need for continuous supervision: Secondary | ICD-10-CM | POA: Diagnosis not present

## 2021-06-19 DIAGNOSIS — J439 Emphysema, unspecified: Secondary | ICD-10-CM | POA: Diagnosis not present

## 2021-06-19 DIAGNOSIS — Z7984 Long term (current) use of oral hypoglycemic drugs: Secondary | ICD-10-CM

## 2021-06-19 DIAGNOSIS — R042 Hemoptysis: Secondary | ICD-10-CM | POA: Diagnosis not present

## 2021-06-19 DIAGNOSIS — I110 Hypertensive heart disease with heart failure: Secondary | ICD-10-CM | POA: Diagnosis present

## 2021-06-19 DIAGNOSIS — I447 Left bundle-branch block, unspecified: Secondary | ICD-10-CM

## 2021-06-19 DIAGNOSIS — J449 Chronic obstructive pulmonary disease, unspecified: Secondary | ICD-10-CM | POA: Diagnosis not present

## 2021-06-19 DIAGNOSIS — Z7982 Long term (current) use of aspirin: Secondary | ICD-10-CM

## 2021-06-19 DIAGNOSIS — Z87891 Personal history of nicotine dependence: Secondary | ICD-10-CM

## 2021-06-19 DIAGNOSIS — Z794 Long term (current) use of insulin: Secondary | ICD-10-CM

## 2021-06-19 DIAGNOSIS — Z7951 Long term (current) use of inhaled steroids: Secondary | ICD-10-CM

## 2021-06-19 DIAGNOSIS — R2681 Unsteadiness on feet: Secondary | ICD-10-CM | POA: Diagnosis not present

## 2021-06-19 DIAGNOSIS — J9622 Acute and chronic respiratory failure with hypercapnia: Secondary | ICD-10-CM | POA: Diagnosis not present

## 2021-06-19 DIAGNOSIS — J9612 Chronic respiratory failure with hypercapnia: Secondary | ICD-10-CM | POA: Diagnosis not present

## 2021-06-19 DIAGNOSIS — R1312 Dysphagia, oropharyngeal phase: Secondary | ICD-10-CM | POA: Diagnosis not present

## 2021-06-19 DIAGNOSIS — K921 Melena: Secondary | ICD-10-CM | POA: Diagnosis present

## 2021-06-19 DIAGNOSIS — K219 Gastro-esophageal reflux disease without esophagitis: Secondary | ICD-10-CM | POA: Diagnosis present

## 2021-06-19 DIAGNOSIS — I5022 Chronic systolic (congestive) heart failure: Secondary | ICD-10-CM | POA: Diagnosis not present

## 2021-06-19 DIAGNOSIS — I5020 Unspecified systolic (congestive) heart failure: Secondary | ICD-10-CM | POA: Diagnosis not present

## 2021-06-19 DIAGNOSIS — J4489 Other specified chronic obstructive pulmonary disease: Secondary | ICD-10-CM

## 2021-06-19 DIAGNOSIS — E875 Hyperkalemia: Secondary | ICD-10-CM | POA: Diagnosis not present

## 2021-06-19 DIAGNOSIS — J9611 Chronic respiratory failure with hypoxia: Secondary | ICD-10-CM | POA: Diagnosis not present

## 2021-06-19 DIAGNOSIS — K5900 Constipation, unspecified: Secondary | ICD-10-CM | POA: Diagnosis present

## 2021-06-19 DIAGNOSIS — M6281 Muscle weakness (generalized): Secondary | ICD-10-CM | POA: Diagnosis not present

## 2021-06-19 DIAGNOSIS — M810 Age-related osteoporosis without current pathological fracture: Secondary | ICD-10-CM | POA: Diagnosis present

## 2021-06-19 LAB — RESP PANEL BY RT-PCR (FLU A&B, COVID) ARPGX2
Influenza A by PCR: NEGATIVE
Influenza B by PCR: NEGATIVE
SARS Coronavirus 2 by RT PCR: NEGATIVE

## 2021-06-19 LAB — CBC WITH DIFFERENTIAL/PLATELET
Abs Immature Granulocytes: 0.06 10*3/uL (ref 0.00–0.07)
Basophils Absolute: 0 10*3/uL (ref 0.0–0.1)
Basophils Relative: 0 %
Eosinophils Absolute: 0 10*3/uL (ref 0.0–0.5)
Eosinophils Relative: 0 %
HCT: 35.1 % — ABNORMAL LOW (ref 36.0–46.0)
Hemoglobin: 11.5 g/dL — ABNORMAL LOW (ref 12.0–15.0)
Immature Granulocytes: 1 %
Lymphocytes Relative: 7 %
Lymphs Abs: 0.8 10*3/uL (ref 0.7–4.0)
MCH: 32.1 pg (ref 26.0–34.0)
MCHC: 32.8 g/dL (ref 30.0–36.0)
MCV: 98 fL (ref 80.0–100.0)
Monocytes Absolute: 0.2 10*3/uL (ref 0.1–1.0)
Monocytes Relative: 2 %
Neutro Abs: 10.3 10*3/uL — ABNORMAL HIGH (ref 1.7–7.7)
Neutrophils Relative %: 90 %
Platelets: 278 10*3/uL (ref 150–400)
RBC: 3.58 MIL/uL — ABNORMAL LOW (ref 3.87–5.11)
RDW: 13.1 % (ref 11.5–15.5)
WBC: 11.4 10*3/uL — ABNORMAL HIGH (ref 4.0–10.5)
nRBC: 0 % (ref 0.0–0.2)

## 2021-06-19 LAB — TROPONIN I (HIGH SENSITIVITY)
Troponin I (High Sensitivity): 32 ng/L — ABNORMAL HIGH (ref <18)
Troponin I (High Sensitivity): 34 ng/L — ABNORMAL HIGH (ref <18)

## 2021-06-19 LAB — I-STAT VENOUS BLOOD GAS, ED
Acid-Base Excess: 15 mmol/L — ABNORMAL HIGH (ref 0.0–2.0)
Bicarbonate: 43.3 mmol/L — ABNORMAL HIGH (ref 20.0–28.0)
Calcium, Ion: 1.23 mmol/L (ref 1.15–1.40)
HCT: 36 % (ref 36.0–46.0)
Hemoglobin: 12.2 g/dL (ref 12.0–15.0)
O2 Saturation: 70 %
Potassium: 3.6 mmol/L (ref 3.5–5.1)
Sodium: 130 mmol/L — ABNORMAL LOW (ref 135–145)
TCO2: 45 mmol/L — ABNORMAL HIGH (ref 22–32)
pCO2, Ven: 70.1 mmHg (ref 44.0–60.0)
pH, Ven: 7.398 (ref 7.250–7.430)
pO2, Ven: 39 mmHg (ref 32.0–45.0)

## 2021-06-19 LAB — COMPREHENSIVE METABOLIC PANEL
ALT: 30 U/L (ref 0–44)
AST: 34 U/L (ref 15–41)
Albumin: 3.8 g/dL (ref 3.5–5.0)
Alkaline Phosphatase: 50 U/L (ref 38–126)
Anion gap: 12 (ref 5–15)
BUN: 16 mg/dL (ref 8–23)
CO2: 35 mmol/L — ABNORMAL HIGH (ref 22–32)
Calcium: 9.7 mg/dL (ref 8.9–10.3)
Chloride: 83 mmol/L — ABNORMAL LOW (ref 98–111)
Creatinine, Ser: 0.73 mg/dL (ref 0.44–1.00)
GFR, Estimated: >60 mL/min (ref >60)
Glucose, Bld: 165 mg/dL — ABNORMAL HIGH (ref 70–99)
Potassium: 3.5 mmol/L (ref 3.5–5.1)
Sodium: 130 mmol/L — ABNORMAL LOW (ref 135–145)
Total Bilirubin: 0.9 mg/dL (ref 0.3–1.2)
Total Protein: 6.7 g/dL (ref 6.5–8.1)

## 2021-06-19 LAB — LACTIC ACID, PLASMA: Lactic Acid, Venous: 0.9 mmol/L (ref 0.5–1.9)

## 2021-06-19 LAB — BRAIN NATRIURETIC PEPTIDE: B Natriuretic Peptide: 232.6 pg/mL — ABNORMAL HIGH (ref 0.0–100.0)

## 2021-06-19 LAB — D-DIMER, QUANTITATIVE: D-Dimer, Quant: 0.9 ug/mL-FEU — ABNORMAL HIGH (ref 0.00–0.50)

## 2021-06-19 LAB — MAGNESIUM: Magnesium: 1.8 mg/dL (ref 1.7–2.4)

## 2021-06-19 MED ORDER — EZETIMIBE 10 MG PO TABS
10.0000 mg | ORAL_TABLET | Freq: Every day | ORAL | Status: DC
  Administered 2021-06-20 – 2021-06-26 (×7): 10 mg via ORAL
  Filled 2021-06-19 (×7): qty 1

## 2021-06-19 MED ORDER — ALBUTEROL SULFATE HFA 108 (90 BASE) MCG/ACT IN AERS
1.0000 | INHALATION_SPRAY | Freq: Four times a day (QID) | RESPIRATORY_TRACT | Status: DC | PRN
Start: 2021-06-19 — End: 2021-06-19

## 2021-06-19 MED ORDER — ESTRADIOL 0.1 MG/GM VA CREA
1.0000 | TOPICAL_CREAM | VAGINAL | Status: DC
  Administered 2021-06-21: 1 via VAGINAL
  Filled 2021-06-19: qty 42.5

## 2021-06-19 MED ORDER — ARFORMOTEROL TARTRATE 15 MCG/2ML IN NEBU
15.0000 ug | INHALATION_SOLUTION | Freq: Two times a day (BID) | RESPIRATORY_TRACT | Status: DC
  Administered 2021-06-19 – 2021-06-26 (×13): 15 ug via RESPIRATORY_TRACT
  Filled 2021-06-19 (×15): qty 2

## 2021-06-19 MED ORDER — BUDESONIDE 0.5 MG/2ML IN SUSP
0.5000 mg | Freq: Two times a day (BID) | RESPIRATORY_TRACT | Status: DC
Start: 2021-06-19 — End: 2021-06-26
  Administered 2021-06-19 – 2021-06-26 (×14): 0.5 mg via RESPIRATORY_TRACT
  Filled 2021-06-19 (×13): qty 2

## 2021-06-19 MED ORDER — METHYLPREDNISOLONE SODIUM SUCC 40 MG IJ SOLR
40.0000 mg | Freq: Once | INTRAMUSCULAR | Status: AC
  Administered 2021-06-19: 40 mg via INTRAVENOUS
  Filled 2021-06-19: qty 1

## 2021-06-19 MED ORDER — VANCOMYCIN HCL 1500 MG/300ML IV SOLN
1500.0000 mg | Freq: Once | INTRAVENOUS | Status: DC
  Filled 2021-06-19: qty 300

## 2021-06-19 MED ORDER — SODIUM CHLORIDE 0.9 % IV SOLN
1.0000 g | INTRAVENOUS | Status: DC
  Administered 2021-06-20 – 2021-06-26 (×7): 1 g via INTRAVENOUS
  Filled 2021-06-19 (×7): qty 10

## 2021-06-19 MED ORDER — VANCOMYCIN HCL 750 MG/150ML IV SOLN: 750.0000 mg | INTRAVENOUS | Status: DC

## 2021-06-19 MED ORDER — LOSARTAN POTASSIUM 50 MG PO TABS
50.0000 mg | ORAL_TABLET | Freq: Every day | ORAL | Status: DC
  Administered 2021-06-19 – 2021-06-24 (×6): 50 mg via ORAL
  Filled 2021-06-19 (×6): qty 1

## 2021-06-19 MED ORDER — IOHEXOL 350 MG/ML SOLN
75.0000 mL | Freq: Once | INTRAVENOUS | Status: AC | PRN
  Administered 2021-06-19: 75 mL via INTRAVENOUS

## 2021-06-19 MED ORDER — SODIUM CHLORIDE 0.9 % IV SOLN
2.0000 g | Freq: Once | INTRAVENOUS | Status: DC
  Administered 2021-06-19: 2 g via INTRAVENOUS
  Filled 2021-06-19: qty 2

## 2021-06-19 MED ORDER — POLYVINYL ALCOHOL 1.4 % OP SOLN
1.0000 [drp] | Freq: Three times a day (TID) | OPHTHALMIC | Status: DC | PRN
  Filled 2021-06-19: qty 15

## 2021-06-19 MED ORDER — VANCOMYCIN HCL 1000 MG/200ML IV SOLN
1000.0000 mg | Freq: Once | INTRAVENOUS | Status: DC
Start: 2021-06-19 — End: 2021-06-19

## 2021-06-19 MED ORDER — IPRATROPIUM-ALBUTEROL 0.5-2.5 (3) MG/3ML IN SOLN
3.0000 mL | Freq: Once | RESPIRATORY_TRACT | Status: AC
  Administered 2021-06-19: 3 mL via RESPIRATORY_TRACT
  Filled 2021-06-19: qty 3

## 2021-06-19 MED ORDER — ALBUTEROL SULFATE (2.5 MG/3ML) 0.083% IN NEBU
2.5000 mg | INHALATION_SOLUTION | Freq: Four times a day (QID) | RESPIRATORY_TRACT | Status: DC
  Administered 2021-06-19 – 2021-06-21 (×6): 2.5 mg via RESPIRATORY_TRACT
  Filled 2021-06-19 (×6): qty 3

## 2021-06-19 MED ORDER — BRIMONIDINE TARTRATE 0.2 % OP SOLN
1.0000 [drp] | Freq: Two times a day (BID) | OPHTHALMIC | Status: DC
  Administered 2021-06-21 – 2021-06-26 (×11): 1 [drp] via OPHTHALMIC
  Filled 2021-06-19: qty 5

## 2021-06-19 MED ORDER — GUAIFENESIN ER 600 MG PO TB12
1200.0000 mg | ORAL_TABLET | Freq: Two times a day (BID) | ORAL | Status: DC
  Administered 2021-06-20 – 2021-06-23 (×8): 1200 mg via ORAL
  Filled 2021-06-19 (×8): qty 2

## 2021-06-19 MED ORDER — FUROSEMIDE 20 MG PO TABS
20.0000 mg | ORAL_TABLET | Freq: Every day | ORAL | Status: DC
  Administered 2021-06-19 – 2021-06-26 (×8): 20 mg via ORAL
  Filled 2021-06-19 (×8): qty 1

## 2021-06-19 MED ORDER — PREDNISONE 10 MG PO TABS
10.0000 mg | ORAL_TABLET | Freq: Every day | ORAL | Status: DC
  Administered 2021-06-20 – 2021-06-21 (×2): 10 mg via ORAL
  Filled 2021-06-19 (×2): qty 1

## 2021-06-19 MED ORDER — INSULIN ASPART 100 UNIT/ML IJ SOLN
0.0000 [IU] | Freq: Three times a day (TID) | INTRAMUSCULAR | Status: DC
  Administered 2021-06-20 – 2021-06-25 (×5): 2 [IU] via SUBCUTANEOUS
  Administered 2021-06-26: 3 [IU] via SUBCUTANEOUS

## 2021-06-19 MED ORDER — DOCUSATE SODIUM 100 MG PO CAPS
100.0000 mg | ORAL_CAPSULE | Freq: Every day | ORAL | Status: DC
  Administered 2021-06-19 – 2021-06-26 (×8): 100 mg via ORAL
  Filled 2021-06-19 (×8): qty 1

## 2021-06-19 MED ORDER — SODIUM CHLORIDE 0.9 % IV SOLN: 2.0000 g | Freq: Two times a day (BID) | INTRAVENOUS | Status: DC

## 2021-06-19 MED ORDER — BENZONATATE 100 MG PO CAPS
200.0000 mg | ORAL_CAPSULE | Freq: Two times a day (BID) | ORAL | Status: DC
  Administered 2021-06-20 – 2021-06-23 (×8): 200 mg via ORAL
  Filled 2021-06-19 (×8): qty 2

## 2021-06-19 MED ORDER — ACETAMINOPHEN 500 MG PO TABS: 1000.0000 mg | ORAL_TABLET | Freq: Two times a day (BID) | ORAL | Status: DC | PRN

## 2021-06-19 MED ORDER — IPRATROPIUM BROMIDE 0.02 % IN SOLN: 0.5000 mg | Freq: Four times a day (QID) | RESPIRATORY_TRACT | Status: DC

## 2021-06-19 MED ORDER — IPRATROPIUM BROMIDE 0.02 % IN SOLN
0.5000 mg | Freq: Four times a day (QID) | RESPIRATORY_TRACT | Status: DC
Start: 2021-06-19 — End: 2021-06-21
  Administered 2021-06-19 – 2021-06-21 (×6): 0.5 mg via RESPIRATORY_TRACT
  Filled 2021-06-19 (×5): qty 2.5

## 2021-06-19 NOTE — Assessment & Plan Note (Signed)
Non-obstructive coronary artery disease by cath.  I would not start ASA at this time due to current hemoptysis.  Continue ezetimibe.

## 2021-06-19 NOTE — Patient Instructions (Signed)
Medication Instructions:   Your physician recommends that you continue on your current medications as directed. Please refer to the Current Medication list given to you today.  *If you need a refill on your cardiac medications before your next appointment, please call your pharmacy*   Lab Work:  -NONE  If you have labs (blood work) drawn today and your tests are completely normal, you will receive your results only by: MyChart Message (if you have MyChart) OR A paper copy in the mail If you have any lab test that is abnormal or we need to change your treatment, we will call you to review the results.   Testing/Procedures:  -NONE   Follow-Up: At [facility], you and your health needs are our priority.  As part of our continuing mission to provide you with exceptional heart care, we have created designated Provider Care Teams.  These Care Teams include your primary Cardiologist (physician) and Advanced Practice Providers (APPs -  Physician Assistants and Nurse Practitioners) who all work together to provide you with the care you need, when you need it.  We recommend signing up for the patient portal called "MyChart".  Sign up information is provided on this After Visit Summary.  MyChart is used to connect with patients for Virtual Visits (Telemedicine).  Patients are able to view lab/test results, encounter notes, upcoming appointments, etc.  Non-urgent messages can be sent to your provider as well.   To learn more about what you can do with MyChart, go to [email or web address].    Your next appointment:   6 week(s)  The format for your next appointment:   In Person  Provider:   Meriam Sprague, MD     Other Instructions

## 2021-06-19 NOTE — ED Provider Notes (Signed)
  Physical Exam  BP (!) 189/92   Pulse 98   Temp 98.8 F (37.1 C) (Oral)   Resp (!) 26   SpO2 100%   Physical Exam  ED Course/Procedures     Procedures  MDM  Care assumed at 3 pm from Dr. Durwin Nora.  Patient has history of COPD on 4 to 5 L nasal cannula at baseline.  Patient is here with hemoptysis.  Patient has 4-5 episodes of hemoptysis a day for the last 3 to 4 days.  Patient went to cardiology clinic and was sent here for further evaluation.  Patient was noted to have diminished breath sounds was given steroids and albuterol.  Signout pending CTA chest  4:32 PM Patient's VBG showed pH of 7.39.  CO2 is 70.  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  Patient's hemoglobin is stable.  CTA showed pneumonia.  Given IV antibiotics for HCAP since she was recently admitted for heart block and had a heart catheterization.  Patient's troponin is stable.  I consulted pulmonary critical care to see patient as a consult.  Hospitalist to admit     Charlynne Pander, MD 06/19/21 [phone]

## 2021-06-19 NOTE — Assessment & Plan Note (Signed)
This occurred during right heart catheterization in the setting of left bundle branch block.  Her beta-blocker was stopped.  She is maintaining sinus rhythm.  We will hold off on rechallenge in her with beta-blocker at this time.  Consider at follow-up.

## 2021-06-19 NOTE — ED Notes (Signed)
Got patient undress into a gown on the monitor patient is resting with call bell in reach 

## 2021-06-19 NOTE — ED Triage Notes (Signed)
Patient arrived to be evaluated for coughing up blood since having cath and temp pacemaker removed last Friday. Patient arrived on 5L of oxygen and was on same prior to cath. Patient alert and oriented, pale on arrival. Complains of chest soreness

## 2021-06-19 NOTE — Telephone Encounter (Signed)
S/w Rachael at Pulmonology  at Rubye Oaks, NP office @ [phone].  Pt had hemoptysis. Tammy was going to see pt today. Pt decided to go to ED.

## 2021-06-19 NOTE — H&P (Signed)
History and Physical    Rebecca [name] WPY:[id number] DOB: 10/30/1940 DOA: 06/19/2021  PCP: Farris Has, MD (Confirm with patient/family/NH records and if not entered, this has to be entered at [facility] point of entry) Patient coming from: [facility]  I have personally briefly reviewed patient's old medical records in [facility]  Chief Complaint: Coughing up blood  HPI: Rebecca [name] is a 80 y.o. 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  Patient was recently hospitalized for right and left sided cardiac cath and she was discharged 4 days ago.  Her symptoms started last Friday evening, she started to cough up small amount of blood clot about a quarter in size 3-4 times a day, mostly in the mornings.  She denies any coughing, shortness of breath, fever or chills.  She has been seen similar amount of hemoptysis in the last 4 days, denied any lightheadedness shortness of breath.  She was on 10 mg of prednisone since the beginning of November, and increased to 40 mg during last admission and scheduled to return to 10 mg daily from tomorrow.  ED Course: No significant worsening of hypoxia, stabilized on 5 L same at home, CT angiogram negative for PE but bilateral lower field infiltrates suspicious for infection.  Hemoglobin 11.5 compared to 11.2 5 days ago.  Pulmonology consulted for possible bronchoscopy.  Review of Systems: As per HPI otherwise 14 point review of systems negative.    Past Medical History:  Diagnosis Date   Congestive heart disease (HCC)    COPD (chronic obstructive pulmonary disease) (HCC)    Diabetes (HCC)    Hyperlipemia    Osteoporosis    Primary hypertension     Past Surgical History:  Procedure Laterality Date   RIGHT/LEFT HEART CATH AND CORONARY ANGIOGRAPHY N/A 06/14/2021    Procedure: RIGHT/LEFT HEART CATH AND CORONARY ANGIOGRAPHY;  Surgeon: Swaziland, [name], MD;  Location: [facility] INVASIVE CV LAB;  Service: Cardiovascular;  Laterality: N/A;   TEMPORARY PACEMAKER N/A 06/14/2021   Procedure: TEMPORARY PACEMAKER;  Surgeon: Swaziland, [name], MD;  Location: [facility] INVASIVE CV LAB;  Service: Cardiovascular;  Laterality: N/A;     reports that she quit smoking about 19 years ago. Her smoking use included cigarettes. She has a 60.00 pack-year smoking history. She has never used smokeless tobacco. She reports current alcohol use of about 1.0 standard drink per week. She reports that she does not use drugs.  Allergies  Allergen Reactions   Gabapentin Other (See Comments)    Affects breathing   Pravastatin Sodium Other (See Comments)    Muscle aches    Rosuvastatin Calcium     Muscle Aches    Simvastatin     Muscle aches  Other reaction(s): Muscle pain   Amoxicillin Rash   Lisinopril Rash    Other reaction(s): rash   Sulfacetamide Rash   Tetracyclines & Related Rash    No family history on file.   Prior to Admission medications   Medication Sig Start Date End Date Taking? Authorizing Provider  acetaminophen (TYLENOL) 500 MG tablet Take 1,000 mg by mouth 2 (two) times daily as needed for mild pain or headache.    [provider]  albuterol (PROVENTIL) (2.5 MG/3ML) 0.083% nebulizer solution Take 3 mLs (2.5 mg total) by nebulization in the morning, at noon, in the evening, and at  bedtime. 06/15/21 07/15/21  Parrett, Virgel Bouquet, NP  albuterol (VENTOLIN HFA) 108 (90 Base) MCG/ACT inhaler Inhale 1-2 puffs into the lungs every 6 (six) hours as needed for wheezing or shortness of breath. 12/27/19   Oretha Milch, MD  arformoterol (BROVANA) 15 MCG/2ML NEBU USE 1 VIAL  IN  NEBULIZER TWICE  DAILY - morning and evening 06/08/21   Parrett, Tammy S, NP  brimonidine (ALPHAGAN) 0.2 % ophthalmic solution Place 1 drop into the left eye 2 (two) times daily. 11/21/20   [provider]  budesonide (PULMICORT) 0.5 MG/2ML nebulizer solution USE 1 VIAL  IN  NEBULIZER TWICE  DAILY - Rinse mouth after treatment 06/08/21   Parrett, Virgel Bouquet, NP  Calcium Carbonate-Vitamin D 600-200 MG-UNIT TABS Take 1 tablet by mouth daily.     [provider]  carboxymethylcellulose (REFRESH PLUS) 0.5 % SOLN Place 1 drop into both eyes 3 (three) times daily as needed (dry eyes).    [provider]  docusate sodium (COLACE) 100 MG capsule Take 100 mg by mouth daily.    [provider]  estradiol (ESTRACE) 0.1 MG/GM vaginal cream Place 1 Applicatorful vaginally every other day.  09/09/18   [provider]  ezetimibe (ZETIA) 10 MG tablet Take 1 tablet (10 mg total) by mouth daily. 06/15/21   [facility], Roe Rutherford, PA  furosemide (LASIX) 20 MG tablet Take 1 tablet (20 mg total) by mouth daily. Take extra 1 tablet by mouth daily as needed for edema. 05/03/21   Tereso Newcomer T, PA-C  ipratropium (ATROVENT) 0.02 % nebulizer solution Take 2.5 mLs (0.5 mg total) by nebulization 4 (four) times daily. 06/01/21 07/01/21  Parrett, Virgel Bouquet, NP  losartan (COZAAR) 50 MG tablet Take 1 tablet (50 mg total) by mouth daily. 05/31/21   Tereso Newcomer T, PA-C  metFORMIN (GLUCOPHAGE) 500 MG tablet Take 1 tablet (500 mg total) by mouth daily with supper. 06/15/21   [facility], Roe Rutherford, PA  Multiple Vitamins-Minerals (EYE VITAMINS PO) Take 1 tablet by mouth daily.    [provider]  predniSONE (DELTASONE) 10 MG tablet Take 4 tablets (40 mg total) by mouth daily with breakfast for 5 days. Take 1 tablet (10 mg total) by mouth daily with breakfast. 06/20/21   Marcelino Duster, PA    Physical Exam: Vitals:   06/19/21 1430 06/19/21 1500 06/19/21 1530 06/19/21 1605  BP: (!) 187/90 (!) 173/84 (!) 184/91 (!) 189/92  Pulse: 94 94 93 98  Resp: (!) 32 (!) 22 17 (!) 26  Temp:      TempSrc:      SpO2: 98% 97% 97% 100%    Constitutional: NAD, calm, comfortable Vitals:   06/19/21 1430  06/19/21 1500 06/19/21 1530 06/19/21 1605  BP: (!) 187/90 (!) 173/84 (!) 184/91 (!) 189/92  Pulse: 94 94 93 98  Resp: (!) 32 (!) 22 17 (!) 26  Temp:      TempSrc:      SpO2: 98% 97% 97% 100%   Eyes: PERRL, lids and conjunctivae normal ENMT: Mucous membranes are moist. Posterior pharynx clear of any exudate or lesions.Normal dentition.  Neck: normal, supple, no masses, no thyromegaly Respiratory: clear to auscultation bilaterally, no wheezing, no crackles. Normal respiratory effort. No accessory muscle use.  Cardiovascular: Regular rate and rhythm, no murmurs / rubs / gallops. No extremity edema. 2+ pedal pulses. No carotid bruits.  Abdomen: no tenderness, no masses palpated. No hepatosplenomegaly. Bowel sounds positive.  Musculoskeletal: no clubbing / cyanosis. No  joint deformity upper and lower extremities. Good ROM, no contractures. Normal muscle tone.  Skin: no rashes, lesions, ulcers. No induration Neurologic: CN 2-12 grossly intact. Sensation intact, DTR normal. Strength 5/5 in all 4.  Psychiatric: Normal judgment and insight. Alert and oriented x 3. Normal mood.     Labs on Admission: I have personally reviewed following labs and imaging studies  CBC: Recent Labs  Lab 06/14/21 1437 06/15/21 0237 06/19/21 1415 06/19/21 1505  WBC  --  11.8* 11.4*  --   NEUTROABS  --  9.9* 10.3*  --   HGB 11.2* 10.2* 11.5* 12.2  HCT 33.0* 33.1* 35.1* 36.0  MCV  --  104.7* 98.0  --   PLT  --  213 278  --    Basic Metabolic Panel: Recent Labs  Lab 06/14/21 1437 06/15/21 0237 06/19/21 1415 06/19/21 1419 06/19/21 1505  NA 138 139 130*  --  130*  K 3.7 4.2 3.5  --  3.6  CL  --  93* 83*  --   --   CO2  --  39* 35*  --   --   GLUCOSE  --  122* 165*  --   --   BUN  --  17 16  --   --   CREATININE  --  0.86 0.73  --   --   CALCIUM  --  8.9 9.7  --   --   MG  --  1.7  --  1.8  --    GFR: Estimated Creatinine Clearance: 51.8 mL/min (by C-G formula based on SCr of 0.73 mg/dL). Liver  Function Tests: Recent Labs  Lab 06/19/21 1415  AST 34  ALT 30  ALKPHOS 50  BILITOT 0.9  PROT 6.7  ALBUMIN 3.8   No results for input(s): LIPASE, AMYLASE in the last 168 hours. No results for input(s): AMMONIA in the last 168 hours. Coagulation Profile: No results for input(s): INR, PROTIME in the last 168 hours. Cardiac Enzymes: No results for input(s): CKTOTAL, CKMB, CKMBINDEX, TROPONINI in the last 168 hours. BNP (last 3 results) Recent Labs    04/10/21 1518  PROBNP 179   HbA1C: No results for input(s): HGBA1C in the last 72 hours. CBG: Recent Labs  Lab 06/14/21 1033 06/14/21 1639 06/15/21 0758 06/15/21 1135  GLUCAP 123* 191* 112* 134*   Lipid Profile: No results for input(s): CHOL, HDL, LDLCALC, TRIG, CHOLHDL, LDLDIRECT in the last 72 hours. Thyroid Function Tests: No results for input(s): TSH, T4TOTAL, FREET4, T3FREE, THYROIDAB in the last 72 hours. Anemia Panel: No results for input(s): VITAMINB12, FOLATE, FERRITIN, TIBC, IRON, RETICCTPCT in the last 72 hours. Urine analysis:    Component Value Date/Time   COLORURINE YELLOW 01/12/2020 1256   APPEARANCEUR HAZY (A) 01/12/2020 1256   LABSPEC 1.008 01/12/2020 1256   PHURINE 6.0 01/12/2020 1256   GLUCOSEU NEGATIVE 01/12/2020 1256   HGBUR SMALL (A) 01/12/2020 1256   BILIRUBINUR NEGATIVE 01/12/2020 1256   KETONESUR 5 (A) 01/12/2020 1256   PROTEINUR NEGATIVE 01/12/2020 1256   NITRITE NEGATIVE 01/12/2020 1256   LEUKOCYTESUR NEGATIVE 01/12/2020 1256    Radiological Exams on Admission: CT Angio Chest PE W and/or Wo Contrast  Result Date: 06/19/2021 CLINICAL DATA:  Hemoptysis since recent cardiac catheterization and pacemaker removal last week. Chest soreness. EXAM: CT ANGIOGRAPHY CHEST WITH CONTRAST TECHNIQUE: Multidetector CT imaging of the chest was performed using the standard protocol during bolus administration of intravenous contrast. Multiplanar CT image reconstructions and MIPs were obtained to evaluate  the vascular anatomy. CONTRAST:  65mL OMNIPAQUE IOHEXOL 350 MG/ML SOLN COMPARISON:  Plain film of earlier in the day. CTA chest 06/11/2021 FINDINGS: Cardiovascular: The quality of this exam for evaluation of pulmonary embolism is good. Mild motion degradation. No evidence of pulmonary embolism. Apparent vascular filling defect on 331/7 in the right lower lobe is secondary to fluid within the endobronchial tree and is similar to on the prior. Aortic atherosclerosis. Tortuous thoracic aorta. Normal heart size, without pericardial effusion. Three vessel coronary artery calcification. Mediastinum/Nodes: No mediastinal or hilar adenopathy. Lungs/Pleura: No pleural fluid.  Advanced bullous emphysema. Bibasilar dependent airspace and less so ground-glass opacity, new since the prior CT. Upper Abdomen: Normal imaged portions of the liver, spleen, stomach pancreas, adrenal glands. Upper pole left renal 1.2 cm incompletely imaged low-density lesion is likely a cyst. Musculoskeletal: No acute osseous abnormality. Review of the MIP images confirms the above findings. IMPRESSION: 1. No evidence of pulmonary embolism. Mild motion degradation. 2. New bibasilar airspace and less so ground-glass opacity, favored to represent aspiration or infection. 3. Aortic Atherosclerosis (ICD10-I70.0) and Emphysema (ICD10-J43.9). Coronary artery atherosclerosis. Electronically Signed   By: Jeronimo Greaves M.D.   On: 06/19/2021 16:13   DG Chest Portable 1 View  Result Date: 06/19/2021 CLINICAL DATA:  Hemoptysis x Friday, pt mentioned having a cath done on 12/1 Tech wore surgical mask/gloves, pt had on maskhemoptysis EXAM: PORTABLE CHEST 1 VIEW COMPARISON:  None. FINDINGS: Normal mediastinum and cardiac silhouette. Normal pulmonary vasculature. No evidence of effusion, infiltrate, or pneumothorax. No acute bony abnormality. IMPRESSION: No acute cardiopulmonary process. Electronically Signed   By: Genevive Bi M.D.   On: 06/19/2021 14:22     EKG: Independently reviewed.  Chronic LBBB  Assessment/Plan Principal Problem:   Hemoptysis Active Problems:   Hematochezia  (please populate well all problems here in Problem List. (For example, if patient is on BP meds at home and you resume or decide to hold them, it is a problem that needs to be her. Same for CAD, COPD, HLD and so on)  Hemoptysis -No clear etiology on CT angiogram, pulmonary consulted, possible bronchoscopy. -H&H stable, supportive care.  Bacterial pneumonia, probably hemoptysis related -Received vancomycin and cefepime, de-escalate to ceftriaxone. -Send MRSA screening  Chronic hypoxic and hypercapnic respiratory failure -VBG showed chronic CO2 retention but normal pH, continue 5 L nasal cannula.  COPD -O2 saturation stable, VBG showed chronic CO2 retention, no symptoms signs of COPD exacerbation on physical exam, continue home breathing regimen. -Start 10 mg of prednisone daily tomorrow.  Chronic systolic CHF -Euvolemic, continue Lasix 20 mg daily  IIDM -Hold metformin for 72 hours -Sliding scale for now.  HTN -Continue ARB.  DVT prophylaxis: SCD Code Status: Full code Family Communication: Daughter at bedside Disposition Plan: Expect 1 to 2 days hospital stay, expect bronchoscopy. Consults called: Pulmonology Admission status: MedSurg admission   Emeline General MD [facility] Pager [phone]  06/19/2021, 5:34 PM

## 2021-06-19 NOTE — Assessment & Plan Note (Signed)
Non-ischemic cardiomyopathy.  Recent cath without significant coronary artery disease.  Volume status seems stable at this time. Her LVEDP was normal at her cath.  She was taken off of Carvedilol due to complete heart block during her cardiac catheterization.  Hopefully, we can resume this at some point.  For now, given her current issue with hemoptysis, I would not re-challenge her at this time.  She could not tolerate spironolactone due to high K+.  She cannot tolerate SGLT2i due to frequent UTIs.  Plan f/u with Dr. Shari Prows in 4-6 weeks.

## 2021-06-19 NOTE — Assessment & Plan Note (Signed)
Blood pressure is elevated today.  This is likely due to her current illness.  Continue losartan 50 mg daily.  Monitor blood pressure at home.

## 2021-06-19 NOTE — ED Notes (Signed)
Pt with noted improvement in work of breathing. No longer sitting in tripod position.

## 2021-06-19 NOTE — ED Provider Notes (Signed)
[facility] EMERGENCY DEPARTMENT Provider Note   CSN: [id number] Arrival date & time: 06/19/21  1252     History No chief complaint on file.   Rebecca [name] is a 80 y.o. female.  HPI Patient presents for hemoptysis.  She endorses hemoptysis over the past 5 days.  Hemoptysis appears as blood clots.  This has not improved or worsened over the past 5 days.  It has been persistent.  She had a recent hospitalization where she underwent a heart catheterization.  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  Temporary placement was placed.  Temporary pacemaker was removed prior to discharge.  Patient had 1 episode of hemoptysis prior to discharge.  This was 5 days ago.  She has had persistent hemoptysis since that time.  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  She is on 6 L of supplemental oxygen at baseline.  She has had severe acute on chronic shortness of breath over these past 5 days.  She has been utilizing her scheduled breathing treatments at home.  She did miss her noon breathing treatment.  She describes soreness that is present in her upper back as well as her chest.  Prior to arrival today, patient was seen at cardiology clinic.  Cardiology team did not feel that her current symptoms are related to her recent cardiac procedures.  They do feel that this is pulmonary in nature.  Patient was advised to go to the ED for further evaluation and treatment.    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   CAD (coronary artery disease) 06/18/2021   Essential hypertension 06/18/2021    Hyperlipidemia 06/18/2021   Left bundle branch block 06/18/2021   Dyspnea on minimal exertion 06/14/2021   Heart block AV complete ([place]) 06/14/2021   Physical deconditioning 08/03/2020   Chronic obstructive pulmonary disease with acute exacerbation [facility])    Palliative care encounter    Goals of care, counseling/discussion    Acute respiratory failure with hypoxia ([place]) 01/13/2020   COPD exacerbation ([place]) 01/12/2020   Nonischemic cardiomyopathy ([place]) 07/02/2018   Lung nodule 06/18/2018   Chronic respiratory failure with hypoxia ([place]) 02/05/2018   COPD with chronic bronchitis and emphysema ([place]) 02/05/2018    Past Surgical History:  Procedure Laterality Date   RIGHT/LEFT HEART CATH AND CORONARY ANGIOGRAPHY N/A 06/14/2021   Procedure: RIGHT/LEFT HEART CATH AND CORONARY ANGIOGRAPHY;  Surgeon: [name], [name], MD;  Location: [place] CV LAB;  Service: Cardiovascular;  Laterality: N/A;   TEMPORARY PACEMAKER N/A 06/14/2021   Procedure: TEMPORARY PACEMAKER;  Surgeon: [name], [name], MD;  Location: [place] CV LAB;  Service: Cardiovascular;  Laterality: N/A;     OB History   No obstetric history on file.     No family history on file.  Social History   Tobacco Use   Smoking status: Former    Packs/day: 1.50    Years: 40.00    Pack years: 60.00    Types: Cigarettes    Quit date: 2003    Years since quitting: 19.9   Smokeless  tobacco: Never  Vaping Use   Vaping Use: Never used  Substance Use Topics   Alcohol use: Yes    Alcohol/week: 1.0 standard drink    Types: 1 Glasses of wine per week   Drug use: Never    Home Medications Prior to Admission medications   Medication Sig Start Date End Date Taking? Authorizing Provider  acetaminophen (TYLENOL) 500 MG tablet Take 1,000 mg by mouth 2 (two) times daily as needed for mild pain or headache.   Yes [provider]  albuterol (PROVENTIL) (2.5 MG/3ML) 0.083% nebulizer solution Take 3 mLs (2.5 mg total) by  nebulization in the morning, at noon, in the evening, and at bedtime. 06/15/21 07/15/21 Yes Parrett, Tammy S, NP  albuterol (VENTOLIN HFA) 108 (90 Base) MCG/ACT inhaler Inhale 1-2 puffs into the lungs every 6 (six) hours as needed for wheezing or shortness of breath. 12/27/19  Yes Rigoberto Noel, MD  arformoterol (BROVANA) 15 MCG/2ML NEBU USE 1 VIAL  IN  NEBULIZER TWICE  DAILY - morning and evening 06/08/21  Yes Parrett, Tammy S, NP  aspirin EC 81 MG tablet Take 81 mg by mouth daily. Swallow whole.   Yes [provider]  brimonidine (ALPHAGAN) 0.2 % ophthalmic solution Place 1 drop into the left eye 2 (two) times daily. 11/21/20  Yes [provider]  budesonide (PULMICORT) 0.5 MG/2ML nebulizer solution USE 1 VIAL  IN  NEBULIZER TWICE  DAILY - Rinse mouth after treatment 06/08/21  Yes Parrett, Tammy S, NP  Calcium Carbonate-Vitamin D 600-200 MG-UNIT TABS Take 1 tablet by mouth every evening.   Yes [provider]  carboxymethylcellulose (REFRESH PLUS) 0.5 % SOLN Place 1 drop into both eyes 3 (three) times daily as needed (dry eyes).   Yes [provider]  docusate sodium (COLACE) 100 MG capsule Take 100 mg by mouth daily.   Yes [provider]  estradiol (ESTRACE) 0.1 MG/GM vaginal cream Place 1 Applicatorful vaginally every other day.  09/09/18  Yes [provider]  ezetimibe (ZETIA) 10 MG tablet Take 1 tablet (10 mg total) by mouth daily. Patient taking differently: Take 10 mg by mouth at bedtime. 06/15/21  Yes Duke, Tami Lin, PA  furosemide (LASIX) 20 MG tablet Take 1 tablet (20 mg total) by mouth daily. Take extra 1 tablet by mouth daily as needed for edema. Patient taking differently: Take 20 mg by mouth daily as needed for fluid or edema. 05/03/21  Yes Weaver, Scott T, PA-C  ipratropium (ATROVENT) 0.02 % nebulizer solution Take 2.5 mLs (0.5 mg total) by nebulization 4 (four) times daily. 06/01/21 07/01/21 Yes Parrett, Tammy S, NP  losartan  (COZAAR) 50 MG tablet Take 1 tablet (50 mg total) by mouth daily. 05/31/21  Yes Weaver, Scott T, PA-C  metFORMIN (GLUCOPHAGE) 500 MG tablet Take 1 tablet (500 mg total) by mouth daily with supper. 06/15/21  Yes Duke, Tami Lin, PA  Multiple Vitamins-Minerals (EYE VITAMINS PO) Take 1 tablet by mouth daily.   Yes [provider]  predniSONE (DELTASONE) 10 MG tablet Take 4 tablets (40 mg total) by mouth daily with breakfast for 5 days. Take 1 tablet (10 mg total) by mouth daily with breakfast. 06/20/21  Yes Duke, Tami Lin, PA    Allergies    Gabapentin, Pravastatin sodium, Rosuvastatin calcium, Simvastatin, Amoxicillin, Lisinopril, Sulfacetamide, and Tetracyclines & related  Review of Systems   Review of Systems  Constitutional:  Positive for activity change, appetite change, chills and fatigue. Negative for fever.  HENT:  Negative for congestion, ear pain and sore throat.   Eyes:  Negative for pain and visual disturbance.  Respiratory:  Positive for cough, chest tightness, shortness of breath and wheezing.   Cardiovascular:  Positive for chest pain. Negative for palpitations.  Gastrointestinal:  Negative for abdominal distention, abdominal pain, diarrhea, nausea and vomiting.  Genitourinary:  Negative for dysuria, flank pain, hematuria and pelvic pain.  Musculoskeletal:  Positive for back pain. Negative for arthralgias.  Skin:  Negative for color change and rash.  Neurological:  Negative for dizziness, seizures, syncope, facial asymmetry, speech difficulty, light-headedness, numbness and headaches.  Hematological:  Does not bruise/bleed easily.  Psychiatric/Behavioral:  Negative for confusion and decreased concentration.   All other systems reviewed and are negative.  Physical Exam Updated Vital Signs BP 133/68   Pulse 89   Temp 98.8 F (37.1 C) (Oral)   Resp (!) 27   SpO2 96%   Physical Exam Vitals and nursing note reviewed.  Constitutional:      General: She is in  acute distress.     Appearance: She is well-developed and normal weight. She is ill-appearing. She is not toxic-appearing or diaphoretic.  HENT:     Head: Normocephalic and atraumatic.     Right Ear: External ear normal.     Left Ear: External ear normal.     Nose: Nose normal.     Mouth/Throat:     Mouth: Mucous membranes are moist.     Pharynx: Oropharynx is clear.  Eyes:     General: No scleral icterus.    Extraocular Movements: Extraocular movements intact.     Conjunctiva/sclera: Conjunctivae normal.  Cardiovascular:     Rate and Rhythm: Normal rate and regular rhythm.     Heart sounds: No murmur heard. Pulmonary:     Effort: Accessory muscle usage, prolonged expiration and respiratory distress present.     Breath sounds: Decreased air movement present. Decreased breath sounds present.     Comments: Tripoding, pursed lip breathing.  Poor air movement on lung auscultation. Abdominal:     Palpations: Abdomen is soft.     Tenderness: There is no abdominal tenderness.  Musculoskeletal:        General: No swelling or deformity. Normal range of motion.     Cervical back: Normal range of motion and neck supple. No rigidity.     Right lower leg: No edema.     Left lower leg: No edema.  Skin:    General: Skin is warm and dry.     Capillary Refill: Capillary refill takes less than 2 seconds.     Coloration: Skin is pale. Skin is not jaundiced.  Neurological:     General: No focal deficit present.     Mental Status: She is alert and oriented to person, place, and time.     Cranial Nerves: No cranial nerve deficit.     Sensory: No sensory deficit.     Motor: No weakness.     Coordination: Coordination normal.  Psychiatric:        Mood and Affect: Mood normal.        Behavior: Behavior normal.        Thought Content: Thought content normal.        Judgment: Judgment normal.    ED Results / Procedures / Treatments   Labs (all labs ordered are listed, but only abnormal results  are displayed) Labs Reviewed  CBC WITH DIFFERENTIAL/PLATELET - Abnormal; Notable for the following components:  Result Value   WBC 11.4 (*)    RBC 3.58 (*)    Hemoglobin 11.5 (*)    HCT 35.1 (*)    Neutro Abs 10.3 (*)    All other components within normal limits  COMPREHENSIVE METABOLIC PANEL - Abnormal; Notable for the following components:   Sodium 130 (*)    Chloride 83 (*)    CO2 35 (*)    Glucose, Bld 165 (*)    All other components within normal limits  BRAIN NATRIURETIC PEPTIDE - Abnormal; Notable for the following components:   B Natriuretic Peptide 232.6 (*)    All other components within normal limits  D-DIMER, QUANTITATIVE (NOT AT [facility]) - Abnormal; Notable for the following components:   D-Dimer, Quant 0.90 (*)    All other components within normal limits  CBC - Abnormal; Notable for the following components:   WBC 11.1 (*)    RBC 3.35 (*)    Hemoglobin 10.6 (*)    HCT 32.7 (*)    All other components within normal limits  BASIC METABOLIC PANEL - Abnormal; Notable for the following components:   Sodium 132 (*)    Potassium 3.4 (*)    Chloride 83 (*)    CO2 38 (*)    Glucose, Bld 105 (*)    Calcium 8.8 (*)    All other components within normal limits  I-STAT VENOUS BLOOD GAS, ED - Abnormal; Notable for the following components:   pCO2, Ven 70.1 (*)    Bicarbonate 43.3 (*)    TCO2 45 (*)    Acid-Base Excess 15.0 (*)    Sodium 130 (*)    All other components within normal limits  TROPONIN I (HIGH SENSITIVITY) - Abnormal; Notable for the following components:   Troponin I (High Sensitivity) 32 (*)    All other components within normal limits  TROPONIN I (HIGH SENSITIVITY) - Abnormal; Notable for the following components:   Troponin I (High Sensitivity) 34 (*)    All other components within normal limits  RESP PANEL BY RT-PCR (FLU A&B, COVID) ARPGX2  CULTURE, BLOOD (ROUTINE X 2)  CULTURE, BLOOD (ROUTINE X 2)  MAGNESIUM  LACTIC ACID, PLASMA  LACTIC ACID,  PLASMA  HEMOGLOBIN A1C    EKG EKG Interpretation  Date/Time:  Tuesday June 19 2021 13:23:11 EST Ventricular Rate:  97 PR Interval:  134 QRS Duration: 134 QT Interval:  406 QTC Calculation: 515 R Axis:   77 Text Interpretation: Normal sinus rhythm Left ventricular hypertrophy with QRS widening and repolarization abnormality ( Cornell product ) Abnormal ECG Confirmed by Godfrey Pick ([id number]) on 06/19/2021 1:44:41 PM  Radiology CT Angio Chest PE W and/or Wo Contrast  Result Date: 06/19/2021 CLINICAL DATA:  Hemoptysis since recent cardiac catheterization and pacemaker removal last week. Chest soreness. EXAM: CT ANGIOGRAPHY CHEST WITH CONTRAST TECHNIQUE: Multidetector CT imaging of the chest was performed using the standard protocol during bolus administration of intravenous contrast. Multiplanar CT image reconstructions and MIPs were obtained to evaluate the vascular anatomy. CONTRAST:  51mL OMNIPAQUE IOHEXOL 350 MG/ML SOLN COMPARISON:  Plain film of earlier in the day. CTA chest 06/11/2021 FINDINGS: Cardiovascular: The quality of this exam for evaluation of pulmonary embolism is good. Mild motion degradation. No evidence of pulmonary embolism. Apparent vascular filling defect on 331/7 in the right lower lobe is secondary to fluid within the endobronchial tree and is similar to on the prior. Aortic atherosclerosis. Tortuous thoracic aorta. Normal heart size, without pericardial effusion. Three vessel coronary artery  calcification. Mediastinum/Nodes: No mediastinal or hilar adenopathy. Lungs/Pleura: No pleural fluid.  Advanced bullous emphysema. Bibasilar dependent airspace and less so ground-glass opacity, new since the prior CT. Upper Abdomen: Normal imaged portions of the liver, spleen, stomach pancreas, adrenal glands. Upper pole left renal 1.2 cm incompletely imaged low-density lesion is likely a cyst. Musculoskeletal: No acute osseous abnormality. Review of the MIP images confirms the above  findings. IMPRESSION: 1. No evidence of pulmonary embolism. Mild motion degradation. 2. New bibasilar airspace and less so ground-glass opacity, favored to represent aspiration or infection. 3. Aortic Atherosclerosis (ICD10-I70.0) and Emphysema (ICD10-J43.9). Coronary artery atherosclerosis. Electronically Signed   By: Jeronimo Greaves M.D.   On: 06/19/2021 16:13   DG Chest Portable 1 View  Result Date: 06/19/2021 CLINICAL DATA:  Hemoptysis x Friday, pt mentioned having a cath done on 12/1 Tech wore surgical mask/gloves, pt had on maskhemoptysis EXAM: PORTABLE CHEST 1 VIEW COMPARISON:  None. FINDINGS: Normal mediastinum and cardiac silhouette. Normal pulmonary vasculature. No evidence of effusion, infiltrate, or pneumothorax. No acute bony abnormality. IMPRESSION: No acute cardiopulmonary process. Electronically Signed   By: Genevive Bi M.D.   On: 06/19/2021 14:22    Procedures Procedures   Medications Ordered in ED Medications  acetaminophen (TYLENOL) tablet 1,000 mg (has no administration in time range)  ezetimibe (ZETIA) tablet 10 mg (has no administration in time range)  furosemide (LASIX) tablet 20 mg (20 mg Oral Given 06/19/21 2042)  losartan (COZAAR) tablet 50 mg (50 mg Oral Given 06/19/21 2041)  predniSONE (DELTASONE) tablet 10 mg (has no administration in time range)  docusate sodium (COLACE) capsule 100 mg (100 mg Oral Given 06/19/21 2041)  estradiol (ESTRACE) vaginal cream 1 Applicatorful (has no administration in time range)  albuterol (PROVENTIL) (2.5 MG/3ML) 0.083% nebulizer solution 2.5 mg (2.5 mg Nebulization Given 06/20/21 0141)  arformoterol (BROVANA) nebulizer solution 15 mcg (15 mcg Nebulization Given 06/19/21 2045)  budesonide (PULMICORT) nebulizer solution 0.5 mg (0.5 mg Nebulization Given 06/19/21 2045)  brimonidine (ALPHAGAN) 0.2 % ophthalmic solution 1 drop (1 drop Left Eye Not Given 06/20/21 0032)  polyvinyl alcohol (LIQUIFILM TEARS) 1.4 % ophthalmic solution 1 drop (has no  administration in time range)  guaiFENesin (MUCINEX) 12 hr tablet 1,200 mg (1,200 mg Oral Given 06/20/21 0151)  benzonatate (TESSALON) capsule 200 mg (200 mg Oral Given 06/20/21 0157)  insulin aspart (novoLOG) injection 0-15 Units (has no administration in time range)  cefTRIAXone (ROCEPHIN) 1 g in sodium chloride 0.9 % 100 mL IVPB (0 g Intravenous Stopped 06/20/21 0218)  ipratropium (ATROVENT) nebulizer solution 0.5 mg (0.5 mg Nebulization Given 06/19/21 2041)  ipratropium-albuterol (DUONEB) 0.5-2.5 (3) MG/3ML nebulizer solution 3 mL (3 mLs Nebulization Given 06/19/21 1431)  methylPREDNISolone sodium succinate (SOLU-MEDROL) 40 mg/mL injection 40 mg (40 mg Intravenous Given 06/19/21 1532)  iohexol (OMNIPAQUE) 350 MG/ML injection 75 mL (75 mLs Intravenous Contrast Given 06/19/21 1457)    ED Course  I have reviewed the triage vital signs and the nursing notes.  Pertinent labs & imaging results that were available during my care of the patient were reviewed by me and considered in my medical decision making (see chart for details).    MDM Rules/Calculators/A&P                         CRITICAL CARE Performed by: Gloris Manchester   Total critical care time: 35 minutes  Critical care time was exclusive of separately billable procedures and treating other patients.  Critical care was  necessary to treat or prevent imminent or life-threatening deterioration.  Critical care was time spent personally by me on the following activities: development of treatment plan with patient and/or surrogate as well as nursing, discussions with consultants, evaluation of patient's response to treatment, examination of patient, obtaining history from patient or surrogate, ordering and performing treatments and interventions, ordering and review of laboratory studies, ordering and review of radiographic studies, pulse oximetry and re-evaluation of patient's condition.   Patient with history of severe COPD who recently was  hospitalized and underwent heart catheterization, presents for persistent hemoptysis and acute on chronic shortness of breath over the past 5 days.  On arrival in the ED, vital signs are notable for hypertension.  On exam, she has severely increased work of breathing.  She is tripoding with pursed lip breathing and prolonged expiration.  She has very poor air movement on lung auscultation.  Nebulized breathing treatment was ordered.  Earlier today, patient did take 40 mg of prednisone.  On reassessment, patient's work of breathing slightly improved.  She had a CTA approximately 1 week ago.  Since that CTA, she has developed these new symptoms.  I do feel that she would benefit from repeat imaging.  Patient does feel that she can lay flat long enough to tolerate the scan.  Scan was ordered.  Care of patient was signed out to oncoming ED provider.  Final Clinical Impression(s) / ED Diagnoses Final diagnoses:  Hemoptysis  HCAP (healthcare-associated pneumonia)    Rx / DC Orders ED Discharge Orders     None        Godfrey Pick, MD 06/20/21 [phone]

## 2021-06-19 NOTE — Progress Notes (Signed)
Pt currently not in any distress at this time. Vitals stable, pt not requiring BiPAP at this time. RT will continue to monitor.

## 2021-06-19 NOTE — Assessment & Plan Note (Addendum)
I am concerned she is having a COPD exacerbation.  As noted, I did discuss with pulmonology today.  The patient has opted to go to the emergency room for urgent evaluation.

## 2021-06-19 NOTE — Assessment & Plan Note (Signed)
Continue ezetimibe.  ?

## 2021-06-19 NOTE — Assessment & Plan Note (Addendum)
She recently underwent R and L cardiac catheterization for evaluation of her cardiomyopathy.  This was complicated by complete heart block.  She did not have a wedge pressure done.  The procedure was aborted before this. She has had increased shortness of breath and hemoptysis since.  She just had a CT done 06/11/21 for an elevated DDimer.  This was neg for pulmonary embolism.   I reviewed her case with Dr. Eden Emms (attending MD).  We do not think her hemoptysis is related to her recent cardiac catheterization or the complications experienced.  With her hx of COPD, she needs evaluation with pulmonology.  I did speak with Rubye Oaks, NP with pulmonology.  She was able to add the pt on at 2pm today.  However, Tammy and I both agree that the pt will likely need admission to the hospital for evaluation.  The pt has opted to go to the ED instead.  I did call triage at [facility] ED to apprise them of her symptoms.

## 2021-06-19 NOTE — ED Provider Notes (Signed)
Emergency Medicine Provider Triage Evaluation Note  Rebecca [name] , a 80 y.o. female  was evaluated in triage.  Pt complains of hemoptysis. Patient had a cath performed on 12/1 and notes hemoptysis ever since. She admits to associated chest pain and shortness of breath. History of COPD. On chronic 6L [place]. Patient states she has been coughing up clots of blood since her cath.   Patient evaluated by Cardiology prior to arrival. Cardiology noted "She recently underwent R and L cardiac catheterization for evaluation of her cardiomyopathy. This was complicated by complete heart block.  She did not have a wedge pressure done.  The procedure was aborted before this. She has had increased shortness of breath and hemoptysis since.  She just had a CT done 06/11/21 for an elevated DDimer.  This was neg for pulmonary embolism.   I reviewed her case with Dr. Eden Emms (attending MD).  We do not think her hemoptysis is related to her recent cardiac catheterization or the complications experienced.  With her hx of COPD, she needs evaluation with pulmonology.  I did speak with Rubye Oaks, NP with pulmonology.  She was able to add the pt on at 2pm today.  However, Tammy and I both agree that the pt will likely need admission to the hospital for evaluation.  The pt has opted to go to the ED instead.  I did call triage at [facility] ED to apprise them of her symptoms"  Review of Systems  Positive: Hemoptysis, CP, SOB Negative: fever  Physical Exam  BP (!) 187/93 (BP Location: Right Arm)   Pulse 96   Temp 98.8 F (37.1 C) (Oral)   Resp 18   SpO2 97%  Gen:   Awake, no distress   Resp:  Normal effort  MSK:   Moves extremities without difficulty  Other:    Medical Decision Making  Medically screening exam initiated at 1:32 PM.  Appropriate orders placed.  Rebecca [name] was informed that the remainder of the evaluation will be completed by another provider, this initial triage assessment does not replace that  evaluation, and the importance of remaining in the ED until their evaluation is complete.  Hemoptysis after recent cath on 12/1. Recent CTA performed on 11/28 which was negative for PE. Patient sent by cardiology for possible admission Cardiac labs ordered EKG CXR Patient brought to room from triage   Mannie Stabile, PA-C 06/19/21 1341    Terald Sleeper, MD 06/19/21 1620

## 2021-06-19 NOTE — Consult Note (Signed)
NAME:  Rebecca [name], MRN:  [id number], DOB:  05-12-1941, LOS: 0 ADMISSION DATE:  06/19/2021, CONSULTATION DATE:  06/19/21 REFERRING MD:  Dr Silverio Lay, CHIEF COMPLAINT:  hemoptysis   History of Present Illness:  80 year old woman history of severe COPD, hypertension with a nonischemic cardiomyopathy and systolic CHF, chronic hypoxic respiratory failure due to all the above.  Recently admitted for elective cardiac catheterization that showed no severe obstructive CAD but was complicated by third-degree heart block and need for temporary pacing.  Also acute dyspnea which was treated with a burst of prednisone 40 mg daily then back down to 10 mg daily (beginning tomorrow).  She is on 5 L/min at baseline. Today she reports she has had persistent dyspnea, did not really respond much to the increase in prednisone.  Difficulty taking deep breath.  She for started to see some hemoptysis associated with her cough on 12/2 the date of her discharge.  She has seen specks, sometimes fresh blood is large and is a quarter every day since.  No real change in wheezing, pain of any kind.  She has not seen any blood elsewhere.  No epistaxis, no oral lesions.   Pertinent  Medical History   Past Medical History:  Diagnosis Date   Congestive heart disease (HCC)    COPD (chronic obstructive pulmonary disease) (HCC)    Diabetes (HCC)    Hyperlipemia    Osteoporosis    Primary hypertension    Significant Hospital Events: Including procedures, antibiotic start and stop dates in addition to other pertinent events   CT-PA 06/19/2021 >> no evidence of pulmonary embolism, no adenopathy, advanced bullous emphysema especially superiorly, bibasilar dependent airspace disease and groundglass opacity, increased compared with a recent CT 06/11/2021.  Possible edema versus atelectasis or pneumonia.  Interim History / Subjective:    Objective   Blood pressure (!) 189/92, pulse 98, temperature 98.8 F (37.1 C), temperature  source Oral, resp. rate (!) 26, SpO2 100 %.       No intake or output data in the 24 hours ending 06/19/21 1655 There were no vitals filed for this visit.  Examination: General: Chronically ill-appearing obese woman, laying in bed in no distress on nasal cannula oxygen HENT: Oropharynx dry, dentures in place, no blood in the mouth.  Nares clear.  No stridor Lungs: Very distant, no crackles or wheezing Cardiovascular: Distant, regular, borderline bradycardic Abdomen: Obese, nondistended with positive bowel sounds Extremities: No edema Neuro: Awake, alert, interacting appropriately, follows commands.  Moves all extremities.  Good strength.  Resolved Hospital Problem list     Assessment & Plan:  Acute on chronic hypoxic respiratory failure, multifactorial in the setting of severe COPD, nonischemic cardiomyopathy and systolic CHF.  CT chest concerning for increased basilar pulmonary infiltrates, question component of pulmonary edema versus pneumonia. -Agree with empiric antibiotics for HCAP -In absence of wheezing would continue her on her baseline prednisone 10 mg daily, defer steroid pulse -Scheduled bronchodilators and Pulmicort -Diuresis if able to tolerate -Follow flu and COVID-19 results  Hemoptysis, new.  Question whether related to her pulmonary infiltrates.  No other clear precipitant or cause noted -Agree with treatment of potential underlying causes and follow for resolution. -If persistent then would consider possible bronchoscopy and airway inspection although this would be a higher risk exam given her chronic respiratory failure.   Labs   CBC: Recent Labs  Lab 06/14/21 1437 06/15/21 0237 06/19/21 1415 06/19/21 1505  WBC  --  11.8* 11.4*  --  NEUTROABS  --  9.9* 10.3*  --   HGB 11.2* 10.2* 11.5* 12.2  HCT 33.0* 33.1* 35.1* 36.0  MCV  --  104.7* 98.0  --   PLT  --  213 278  --     Basic Metabolic Panel: Recent Labs  Lab 06/14/21 1437 06/15/21 0237  06/19/21 1415 06/19/21 1419 06/19/21 1505  NA 138 139 130*  --  130*  K 3.7 4.2 3.5  --  3.6  CL  --  93* 83*  --   --   CO2  --  39* 35*  --   --   GLUCOSE  --  122* 165*  --   --   BUN  --  17 16  --   --   CREATININE  --  0.86 0.73  --   --   CALCIUM  --  8.9 9.7  --   --   MG  --  1.7  --  1.8  --    GFR: Estimated Creatinine Clearance: 51.8 mL/min (by C-G formula based on SCr of 0.73 mg/dL). Recent Labs  Lab 06/15/21 0237 06/19/21 1415  WBC 11.8* 11.4*    Liver Function Tests: Recent Labs  Lab 06/19/21 1415  AST 34  ALT 30  ALKPHOS 50  BILITOT 0.9  PROT 6.7  ALBUMIN 3.8   No results for input(s): LIPASE, AMYLASE in the last 168 hours. No results for input(s): AMMONIA in the last 168 hours.  ABG    Component Value Date/Time   PHART 7.304 (L) 06/14/2021 1437   PCO2ART 91.0 (HH) 06/14/2021 1437   PO2ART 146 (H) 06/14/2021 1437   HCO3 43.3 (H) 06/19/2021 1505   TCO2 45 (H) 06/19/2021 1505   O2SAT 70.0 06/19/2021 1505     Coagulation Profile: No results for input(s): INR, PROTIME in the last 168 hours.  Cardiac Enzymes: No results for input(s): CKTOTAL, CKMB, CKMBINDEX, TROPONINI in the last 168 hours.  HbA1C: No results found for: HGBA1C  CBG: Recent Labs  Lab 06/14/21 1033 06/14/21 1639 06/15/21 0758 06/15/21 1135  GLUCAP 123* 191* 112* 134*    Review of Systems:   As per HPI  Past Medical History:  She,  has a past medical history of Congestive heart disease (HCC), COPD (chronic obstructive pulmonary disease) (HCC), Diabetes (HCC), Hyperlipemia, Osteoporosis, and Primary hypertension.   Surgical History:   Past Surgical History:  Procedure Laterality Date   RIGHT/LEFT HEART CATH AND CORONARY ANGIOGRAPHY N/A 06/14/2021   Procedure: RIGHT/LEFT HEART CATH AND CORONARY ANGIOGRAPHY;  Surgeon: Swaziland, [name], MD;  Location: [facility] INVASIVE CV LAB;  Service: Cardiovascular;  Laterality: N/A;   TEMPORARY PACEMAKER N/A 06/14/2021   Procedure:  TEMPORARY PACEMAKER;  Surgeon: Swaziland, [name], MD;  Location: [facility] INVASIVE CV LAB;  Service: Cardiovascular;  Laterality: N/A;     Social History:   reports that she quit smoking about 19 years ago. Her smoking use included cigarettes. She has a 60.00 pack-year smoking history. She has never used smokeless tobacco. She reports current alcohol use of about 1.0 standard drink per week. She reports that she does not use drugs.   Family History:  Her family history is not on file.   Allergies Allergies  Allergen Reactions   Gabapentin Other (See Comments)    Affects breathing   Pravastatin Sodium Other (See Comments)    Muscle aches    Rosuvastatin Calcium     Muscle Aches    Simvastatin     Muscle  aches  Other reaction(s): Muscle pain   Amoxicillin Rash   Lisinopril Rash    Other reaction(s): rash   Sulfacetamide Rash   Tetracyclines & Related Rash     Home Medications  Prior to Admission medications   Medication Sig Start Date End Date Taking? Authorizing Provider  acetaminophen (TYLENOL) 500 MG tablet Take 1,000 mg by mouth 2 (two) times daily as needed for mild pain or headache.    [provider]  albuterol (PROVENTIL) (2.5 MG/3ML) 0.083% nebulizer solution Take 3 mLs (2.5 mg total) by nebulization in the morning, at noon, in the evening, and at bedtime. 06/15/21 07/15/21  Parrett, Virgel Bouquet, NP  albuterol (VENTOLIN HFA) 108 (90 Base) MCG/ACT inhaler Inhale 1-2 puffs into the lungs every 6 (six) hours as needed for wheezing or shortness of breath. 12/27/19   Oretha Milch, MD  arformoterol (BROVANA) 15 MCG/2ML NEBU USE 1 VIAL  IN  NEBULIZER TWICE  DAILY - morning and evening 06/08/21   Parrett, Tammy S, NP  brimonidine (ALPHAGAN) 0.2 % ophthalmic solution Place 1 drop into the left eye 2 (two) times daily. 11/21/20   [provider]  budesonide (PULMICORT) 0.5 MG/2ML nebulizer solution USE 1 VIAL  IN  NEBULIZER TWICE  DAILY - Rinse mouth after treatment 06/08/21    Parrett, Virgel Bouquet, NP  Calcium Carbonate-Vitamin D 600-200 MG-UNIT TABS Take 1 tablet by mouth daily.     [provider]  carboxymethylcellulose (REFRESH PLUS) 0.5 % SOLN Place 1 drop into both eyes 3 (three) times daily as needed (dry eyes).    [provider]  docusate sodium (COLACE) 100 MG capsule Take 100 mg by mouth daily.    [provider]  estradiol (ESTRACE) 0.1 MG/GM vaginal cream Place 1 Applicatorful vaginally every other day.  09/09/18   [provider]  ezetimibe (ZETIA) 10 MG tablet Take 1 tablet (10 mg total) by mouth daily. 06/15/21   [facility], Roe Rutherford, PA  furosemide (LASIX) 20 MG tablet Take 1 tablet (20 mg total) by mouth daily. Take extra 1 tablet by mouth daily as needed for edema. 05/03/21   Tereso Newcomer T, PA-C  ipratropium (ATROVENT) 0.02 % nebulizer solution Take 2.5 mLs (0.5 mg total) by nebulization 4 (four) times daily. 06/01/21 07/01/21  Parrett, Virgel Bouquet, NP  losartan (COZAAR) 50 MG tablet Take 1 tablet (50 mg total) by mouth daily. 05/31/21   Tereso Newcomer T, PA-C  metFORMIN (GLUCOPHAGE) 500 MG tablet Take 1 tablet (500 mg total) by mouth daily with supper. 06/15/21   [facility], Roe Rutherford, PA  Multiple Vitamins-Minerals (EYE VITAMINS PO) Take 1 tablet by mouth daily.    [provider]  predniSONE (DELTASONE) 10 MG tablet Take 4 tablets (40 mg total) by mouth daily with breakfast for 5 days. Take 1 tablet (10 mg total) by mouth daily with breakfast. 06/20/21   [facility], Roe Rutherford, PA     Critical care time: NA     Levy Pupa, MD, PhD 06/19/2021, 5:04 PM [facility] [phone] or if no answer before 7:00PM call [phone] For any issues after 7:00PM please call eLink [phone]

## 2021-06-19 NOTE — Progress Notes (Signed)
Pharmacy Antibiotic Note  Rebecca [name] is a 80 y.o. female admitted on 06/19/2021 with pneumonia.  Pharmacy has been consulted for Cefepime dosing. WBC elevated. SCr wnl, AF   Plan: -Cefepime 2 gM IV Q 12 hours -Vancomycin 1500 mg IV load followed Vancomycin 750 mg IV Q 24 hrs. Goal AUC 400-550. Expected AUC: 472 SCr used: 0.73 -Monitor CBC, renal fx, cultures and clinical progress -Vanc levels as indicated       Temp (24hrs), Avg:98.7 F (37.1 C), Min:98.6 F (37 C), Max:98.8 F (37.1 C)  Recent Labs  Lab 06/15/21 0237 06/19/21 1415  WBC 11.8* 11.4*  CREATININE 0.86 0.73    Estimated Creatinine Clearance: 51.8 mL/min (by C-G formula based on SCr of 0.73 mg/dL).    Allergies  Allergen Reactions   Gabapentin Other (See Comments)    Affects breathing   Pravastatin Sodium Other (See Comments)    Muscle aches    Rosuvastatin Calcium     Muscle Aches    Simvastatin     Muscle aches  Other reaction(s): Muscle pain   Amoxicillin Rash   Lisinopril Rash    Other reaction(s): rash   Sulfacetamide Rash   Tetracyclines & Related Rash    Antimicrobials this admission: Cefepime 12/6 >>  Vancomycin 12/6 >>   Dose adjustments this admission:   Microbiology results: 12/6 BCx:    Thank you for allowing pharmacy to be a part of this patient's care.  Vinnie Level, PharmD., BCPS, BCCCP Clinical Pharmacist Please refer to [facility] for unit-specific pharmacist

## 2021-06-20 ENCOUNTER — Other Ambulatory Visit: Payer: Medicare HMO | Admitting: *Deleted

## 2021-06-20 ENCOUNTER — Other Ambulatory Visit: Payer: Medicare HMO

## 2021-06-20 DIAGNOSIS — R042 Hemoptysis: Secondary | ICD-10-CM | POA: Diagnosis not present

## 2021-06-20 DIAGNOSIS — J189 Pneumonia, unspecified organism: Secondary | ICD-10-CM

## 2021-06-20 LAB — BASIC METABOLIC PANEL
Anion gap: 11 (ref 5–15)
BUN: 15 mg/dL (ref 8–23)
CO2: 38 mmol/L — ABNORMAL HIGH (ref 22–32)
Calcium: 8.8 mg/dL — ABNORMAL LOW (ref 8.9–10.3)
Chloride: 83 mmol/L — ABNORMAL LOW (ref 98–111)
Creatinine, Ser: 0.79 mg/dL (ref 0.44–1.00)
GFR, Estimated: >60 mL/min (ref >60)
Glucose, Bld: 105 mg/dL — ABNORMAL HIGH (ref 70–99)
Potassium: 3.4 mmol/L — ABNORMAL LOW (ref 3.5–5.1)
Sodium: 132 mmol/L — ABNORMAL LOW (ref 135–145)

## 2021-06-20 LAB — CBC
HCT: 32.7 % — ABNORMAL LOW (ref 36.0–46.0)
Hemoglobin: 10.6 g/dL — ABNORMAL LOW (ref 12.0–15.0)
MCH: 31.6 pg (ref 26.0–34.0)
MCHC: 32.4 g/dL (ref 30.0–36.0)
MCV: 97.6 fL (ref 80.0–100.0)
Platelets: 259 10*3/uL (ref 150–400)
RBC: 3.35 MIL/uL — ABNORMAL LOW (ref 3.87–5.11)
RDW: 13.4 % (ref 11.5–15.5)
WBC: 11.1 10*3/uL — ABNORMAL HIGH (ref 4.0–10.5)
nRBC: 0 % (ref 0.0–0.2)

## 2021-06-20 LAB — PROTIME-INR
INR: 1 (ref 0.8–1.2)
Prothrombin Time: 12.7 seconds (ref 11.4–15.2)

## 2021-06-20 LAB — CBG MONITORING, ED
Glucose-Capillary: 101 mg/dL — ABNORMAL HIGH (ref 70–99)
Glucose-Capillary: 126 mg/dL — ABNORMAL HIGH (ref 70–99)
Glucose-Capillary: 128 mg/dL — ABNORMAL HIGH (ref 70–99)

## 2021-06-20 MED ORDER — SODIUM CHLORIDE 0.9 % IV SOLN: 500.0000 mg | INTRAVENOUS | Status: DC

## 2021-06-20 MED ORDER — POTASSIUM CHLORIDE CRYS ER 20 MEQ PO TBCR
20.0000 meq | EXTENDED_RELEASE_TABLET | Freq: Once | ORAL | Status: AC
  Administered 2021-06-20: 20 meq via ORAL
  Filled 2021-06-20: qty 1

## 2021-06-20 MED ORDER — SODIUM CHLORIDE 0.9 % IV SOLN
100.0000 mg | Freq: Two times a day (BID) | INTRAVENOUS | Status: DC
  Administered 2021-06-20 – 2021-06-22 (×5): 100 mg via INTRAVENOUS
  Filled 2021-06-20 (×6): qty 100

## 2021-06-20 MED ORDER — SODIUM CHLORIDE 0.9 % IV SOLN: 100.0000 mg | Freq: Two times a day (BID) | INTRAVENOUS | Status: DC

## 2021-06-20 NOTE — Plan of Care (Signed)

## 2021-06-20 NOTE — Progress Notes (Signed)
[id number]) Hospital Liaison note:  This patient is currently enrolled in [facility] outpatient-based Palliative Care. Will continue to follow for disposition.  Please call with any outpatient palliative questions or concerns.  Thank you, Abran Cantor, LPN [facility] Liaison [phone]

## 2021-06-20 NOTE — Telephone Encounter (Signed)
Pt sent to ED from office and admitted to [facility]. Tereso Newcomer, PA-C    06/20/2021 2:23 PM

## 2021-06-20 NOTE — ED Notes (Signed)
Breakfast orders placed 

## 2021-06-20 NOTE — Progress Notes (Signed)
NAME:  Rebecca [name], MRN:  [id number], DOB:  04-27-1941, LOS: 1 ADMISSION DATE:  06/19/2021, CONSULTATION DATE:  06/19/21 REFERRING MD:  Dr Silverio Lay, CHIEF COMPLAINT:  hemoptysis   History of Present Illness:  80 year old woman history of severe COPD, hypertension with a nonischemic cardiomyopathy and systolic CHF, chronic hypoxic respiratory failure due to all the above.  Recently admitted for elective cardiac catheterization that showed no severe obstructive CAD but was complicated by third-degree heart block and need for temporary pacing.  Also acute dyspnea which was treated with a burst of prednisone 40 mg daily then back down to 10 mg daily (beginning tomorrow).  She is on 5 L/min at baseline. Today she reports she has had persistent dyspnea, did not really respond much to the increase in prednisone.  Difficulty taking deep breath.  She for started to see some hemoptysis associated with her cough on 12/2 the date of her discharge.  She has seen specks, sometimes fresh blood is large and is a quarter every day since.  No real change in wheezing, pain of any kind.  She has not seen any blood elsewhere.  No epistaxis, no oral lesions.   Pertinent  Medical History   Past Medical History:  Diagnosis Date   Congestive heart disease (HCC)    COPD (chronic obstructive pulmonary disease) (HCC)    Diabetes (HCC)    Hyperlipemia    Osteoporosis    Primary hypertension    Significant Hospital Events: Including procedures, antibiotic start and stop dates in addition to other pertinent events   CT-PA 06/19/2021 >> no evidence of pulmonary embolism, no adenopathy, advanced bullous emphysema especially superiorly, bibasilar dependent airspace disease and groundglass opacity, increased compared with a recent CT 06/11/2021.  Possible edema versus atelectasis or pneumonia.  Interim History / Subjective:  Subjectively feeling a bit better, less shortness of breath since antibiotics added Prednisone to 10  mg today Fluticasone nasal spray added to Tessalon and Mucinex since yesterday She had 2 more episodes of hemoptysis, about a quarter size each, a bit darker than previous  Objective   Blood pressure (!) 145/80, pulse 96, temperature 98.1 F (36.7 C), temperature source Oral, resp. rate (!) 27, SpO2 94 %.        Intake/Output Summary (Last 24 hours) at 06/20/2021 1031 Last data filed at 06/19/2021 1756 Gross per 24 hour  Intake 103.83 ml  Output --  Net 103.83 ml   There were no vitals filed for this visit.  Examination: General: Chronically ill-appearing obese woman, sitting up side of bed, in no distress on nasal cannula oxygen HENT: no change > Oropharynx dry, dentures in place, no blood in the mouth.  Nares clear.  No stridor Lungs: Very distant, no wheeze, no crackles Cardiovascular: Distant, regular, no murmur Abdomen: Nondistended, positive bowel sounds Extremities: No edema Neuro: No change  > awake, alert, interacting appropriately, follows commands.  Moves all extremities.  Good strength.  Resolved Hospital Problem list     Assessment & Plan:  Acute on chronic hypoxic respiratory failure, multifactorial in the setting of severe COPD, nonischemic cardiomyopathy and systolic CHF.  CT chest concerning for increased basilar pulmonary infiltrates, question component of pulmonary edema versus pneumonia.  Flu and COVID-19 are negative -Agree with empiric antibiotics for HCAP as ordered.  Respiratory culture if possible. -No clear evidence for an acute exacerbation of her COPD.  Agree with prednisone 10 mg daily, her baseline -Pulmicort and scheduled bronchodilators as ordered -Consider diuresis as blood pressure,  renal function, electrolytes will tolerate   Hemoptysis, new.  Question whether related to her pulmonary infiltrates.  No other clear precipitant or cause noted -Agree she probably would benefit from cough suppression, anything that will decrease her cough frequency  and severity.  Agree with Mucinex, Tessalon if needed.  She can use the fluticasone nasal spray if she feels that postnasal drainage is contributing to her cough -Agree with treatment of potential underlying causes for her cough, hemoptysis and follows bleeding for resolution. -If her hemoptysis persists then will need to consider possible bronchoscopy and airway inspection.  She understands this and the fact that she would be a risky candidate for procedure and sedation.  Would try to avoid if possible.   Labs   CBC: Recent Labs  Lab 06/14/21 1437 06/15/21 0237 06/19/21 1415 06/19/21 1505 06/20/21 0615  WBC  --  11.8* 11.4*  --  11.1*  NEUTROABS  --  9.9* 10.3*  --   --   HGB 11.2* 10.2* 11.5* 12.2 10.6*  HCT 33.0* 33.1* 35.1* 36.0 32.7*  MCV  --  104.7* 98.0  --  97.6  PLT  --  213 278  --  259    Basic Metabolic Panel: Recent Labs  Lab 06/14/21 1437 06/15/21 0237 06/19/21 1415 06/19/21 1419 06/19/21 1505 06/20/21 0615  NA 138 139 130*  --  130* 132*  K 3.7 4.2 3.5  --  3.6 3.4*  CL  --  93* 83*  --   --  83*  CO2  --  39* 35*  --   --  38*  GLUCOSE  --  122* 165*  --   --  105*  BUN  --  17 16  --   --  15  CREATININE  --  0.86 0.73  --   --  0.79  CALCIUM  --  8.9 9.7  --   --  8.8*  MG  --  1.7  --  1.8  --   --    GFR: Estimated Creatinine Clearance: 51.8 mL/min (by C-G formula based on SCr of 0.79 mg/dL). Recent Labs  Lab 06/15/21 0237 06/19/21 1415 06/19/21 1640 06/20/21 0615  WBC 11.8* 11.4*  --  11.1*  LATICACIDVEN  --   --  0.9  --     Liver Function Tests: Recent Labs  Lab 06/19/21 1415  AST 34  ALT 30  ALKPHOS 50  BILITOT 0.9  PROT 6.7  ALBUMIN 3.8   No results for input(s): LIPASE, AMYLASE in the last 168 hours. No results for input(s): AMMONIA in the last 168 hours.  ABG    Component Value Date/Time   PHART 7.304 (L) 06/14/2021 1437   PCO2ART 91.0 (HH) 06/14/2021 1437   PO2ART 146 (H) 06/14/2021 1437   HCO3 43.3 (H) 06/19/2021  1505   TCO2 45 (H) 06/19/2021 1505   O2SAT 70.0 06/19/2021 1505     CBG: Recent Labs  Lab 06/14/21 1033 06/14/21 1639 06/15/21 0758 06/15/21 1135 06/20/21 0750  GLUCAP 123* 191* 112* 134* 101*     Critical care time: NA     Levy Pupa, MD, PhD 06/20/2021, 10:31 AM [facility] [phone] or if no answer before 7:00PM call [phone] For any issues after 7:00PM please call eLink [phone]

## 2021-06-20 NOTE — Progress Notes (Signed)
PROGRESS NOTE    Rebecca [name]  ZOX:[id number] DOB: Aug 10, 1940 DOA: 06/19/2021 PCP: Farris Has, MD   Brief Narrative/Hospital Course: Rebecca [name], 80 y.o. 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  Was not prednisone since beginning of November that was increased to 40 mg from 10 during last admission and is scheduled to return to 10 mg from 12/7 In the ED on 5 L nasal cannula CT angio negative for PE but with bilateral lower field infiltrates suspicious for infection hemoglobin 11.5 g stable, PCCM was consulted and admitted  Subjective: Seen and examined this morning.  Resting comfortably on 4 L nasal cannula.  Daughter is at the bedside Patient had overnight recurrent hemoptysis with clot, took 1 dose of Tessalon. This morning not coughing.  Not in distress.  Feels warm   Assessment & Plan:  Hemoptysis Bacterial pneumonia: CT angio no PE, but with B/L lower field infiltrates.  Unclear etiology (clinic for pneumonia rule out other etiology, defer further work-up to pulmonary following closely.  Continue ceftriaxone, add azithromycin, continue bronchodilators  Chronic hypoxic and hypercapnic respiratory failure on 5 L nasal cannula COPD: Vbg-chronic status compensated hypercapnia.  Continue home inhalers-bronchodilators and monitor respiratory status closely 20  Chronic systolic CHF on Lasix 20 mg daily, volume status is stable.  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 Status is: Inpatient Remains inpatient appropriate because: For ongoing management of hematemesis pneumonia Disposition: Currently not medically stable for discharge. Anticipated Disposition: TBD   Objective: Vitals last 24 hrs: Vitals:   06/20/21 0200 06/20/21 0300 06/20/21 0400 06/20/21 0700  BP: (!) 123/96 (!) 166/85 131/75 133/68  Pulse: 99 99 91 89  Resp: (!) 29 (!) 30 18 (!) 27  Temp:      TempSrc:      SpO2: 92% 90% 94% 96%   Weight change:   Intake/Output Summary (Last 24 hours) at 06/20/2021 0819 Last data filed at 06/19/2021 1756 Gross per 24 hour  Intake 103.83 ml  Output --  Net 103.83 ml   Net IO Since Admission: 103.83 mL [06/20/21 0819]   Physical Examination: General exam: AA0x3, obese, weak,older than stated age. HEENT:Oral mucosa moist, Ear/Nose WNL grossly,dentition normal. Respiratory system: B/l diminished BS, no use of accessory muscle, non tender. Cardiovascular system: S1 & S2 +,No JVD. Gastrointestinal system: Abdomen soft, NT,ND, BS+. Nervous System:[name], awake, moving extremities. Extremities: edema none, distal peripheral pulses palpable.  Skin: No rashes, no icterus. MSK: Normal muscle bulk, tone, power.  Medications reviewed:  Scheduled Meds:  albuterol  2.5 mg Nebulization Q6H   arformoterol  15 mcg  Nebulization BID   benzonatate  200 mg Oral BID   brimonidine  1 drop Left Eye BID   budesonide  0.5 mg Nebulization BID   docusate sodium  100 mg Oral Daily   [START ON  06/21/2021] estradiol  1 Applicatorful Vaginal QODAY   ezetimibe  10 mg Oral Daily   furosemide  20 mg Oral Daily   guaiFENesin  1,200 mg Oral BID   insulin aspart  0-15 Units Subcutaneous TID WC   ipratropium  0.5 mg Nebulization QID   losartan  50 mg Oral Daily   predniSONE  10 mg Oral Q breakfast   Continuous Infusions:  cefTRIAXone (ROCEPHIN)  IV Stopped (06/20/21 1478)   Diet Order             Diet heart healthy/carb modified Room service appropriate? Yes; Fluid consistency: Thin  Diet effective now                 Weight change:   Wt Readings from Last 3 Encounters:  06/19/21 71.2 kg  06/14/21 71.1 kg  06/05/21 73 kg     Consultants:see note  Procedures:see note Antimicrobials: Anti-infectives (From admission, onward)    Start     Dose/Rate Route Frequency Ordered Stop   06/20/21 1800  vancomycin (VANCOREADY) IVPB 750 mg/150 mL  Status:  Discontinued        750 mg 150 mL/hr over 60 Minutes Intravenous Every 24 hours 06/19/21 1659 06/19/21 1714   06/20/21 0600  ceFEPIme (MAXIPIME) 2 g in sodium chloride 0.9 % 100 mL IVPB  Status:  Discontinued        2 g 200 mL/hr over 30 Minutes Intravenous Every 12 hours 06/19/21 1659 06/19/21 1714   06/20/21 0000  cefTRIAXone (ROCEPHIN) 1 g in sodium chloride 0.9 % 100 mL IVPB        1 g 200 mL/hr over 30 Minutes Intravenous Every 24 hours 06/19/21 1714     06/19/21 1630  vancomycin (VANCOREADY) IVPB 1000 mg/200 mL  Status:  Discontinued        1,000 mg 200 mL/hr over 60 Minutes Intravenous  Once 06/19/21 1623 06/19/21 1627   06/19/21 1630  vancomycin (VANCOREADY) IVPB 1500 mg/300 mL  Status:  Discontinued        1,500 mg 150 mL/hr over 120 Minutes Intravenous  Once 06/19/21 1627 06/19/21 1714   06/19/21 1630  ceFEPIme (MAXIPIME) 2 g in sodium chloride 0.9 % 100 mL IVPB  Status:  Discontinued        2 g 200 mL/hr over 30 Minutes Intravenous  Once 06/19/21 1627 06/19/21 1751      Culture/Microbiology    Component Value  Date/Time   SDES URINE, RANDOM 01/12/2020 1134   SPECREQUEST  01/12/2020 1134    NONE Performed at [facility], [address] [place], [place] [address]    CULT >=100,000 COLONIES/mL VIRIDANS STREPTOCOCCUS (A) 01/12/2020 1134   REPTSTATUS 01/15/2020 FINAL 01/12/2020 1134    Other culture-see note  Unresulted Labs (From admission, onward)     Start     Ordered   06/19/21 1710  Hemoglobin A1c  Once,   R       Comments: To assess prior glycemic control    06/19/21 1712   06/19/21 1624  Lactic acid, plasma  Now then every 2 hours,   STAT      06/19/21 1623   06/19/21 1624  Blood culture (routine x 2)  BLOOD CULTURE X 2,  STAT      06/19/21 1623          Data Reviewed: I have personally reviewed following labs and imaging studies CBC: Recent Labs  Lab 06/14/21 1437 06/15/21 0237 06/19/21 1415 06/19/21 1505 06/20/21 0615  WBC  --  11.8* 11.4*  --  11.1*  NEUTROABS  --  9.9* 10.3*  --   --   HGB 11.2* 10.2* 11.5* 12.2 10.6*  HCT 33.0* 33.1* 35.1* 36.0 32.7*  MCV  --  104.7* 98.0  --  97.6  PLT  --  213 278  --  259   Basic Metabolic Panel: Recent Labs  Lab 06/14/21 1437 06/15/21 0237 06/19/21 1415 06/19/21 1419 06/19/21 1505 06/20/21 0615  NA 138 139 130*  --  130* 132*  K 3.7 4.2 3.5  --  3.6 3.4*  CL  --  93* 83*  --   --  83*  CO2  --  39* 35*  --   --  38*  GLUCOSE  --  122* 165*  --   --  105*  BUN  --  17 16  --   --  15  CREATININE  --  0.86 0.73  --   --  0.79  CALCIUM  --  8.9 9.7  --   --  8.8*  MG  --  1.7  --  1.8  --   --    GFR: Estimated Creatinine Clearance: 51.8 mL/min (by C-G formula based on SCr of 0.79 mg/dL). Liver Function Tests: Recent Labs  Lab 06/19/21 1415  AST 34  ALT 30  ALKPHOS 50  BILITOT 0.9  PROT 6.7  ALBUMIN 3.8   No results for input(s): LIPASE, AMYLASE in the last 168 hours. No results for input(s): AMMONIA in the last 168 hours. Coagulation Profile: No results for input(s): INR, PROTIME in the last  168 hours. Cardiac Enzymes: No results for input(s): CKTOTAL, CKMB, CKMBINDEX, TROPONINI in the last 168 hours. BNP (last 3 results) Recent Labs    04/10/21 1518  PROBNP 179   HbA1C: No results for input(s): HGBA1C in the last 72 hours. CBG: Recent Labs  Lab 06/14/21 1033 06/14/21 1639 06/15/21 0758 06/15/21 1135 06/20/21 0750  GLUCAP 123* 191* 112* 134* 101*   Lipid Profile: No results for input(s): CHOL, HDL, LDLCALC, TRIG, CHOLHDL, LDLDIRECT in the last 72 hours. Thyroid Function Tests: No results for input(s): TSH, T4TOTAL, FREET4, T3FREE, THYROIDAB in the last 72 hours. Anemia Panel: No results for input(s): VITAMINB12, FOLATE, FERRITIN, TIBC, IRON, RETICCTPCT in the last 72 hours. Sepsis Labs: Recent Labs  Lab 06/19/21 1640  LATICACIDVEN 0.9    Recent Results (from the past 240 hour(s))  Resp Panel by RT-PCR (Flu A&B, Covid) Nasopharyngeal Swab     Status: None   Collection Time: 06/14/21  5:36 PM   Specimen: Nasopharyngeal Swab; Nasopharyngeal(NP) swabs in vial transport medium  Result Value Ref Range Status   SARS Coronavirus 2 by RT PCR NEGATIVE NEGATIVE Final    Comment: (NOTE) SARS-CoV-2 target nucleic acids are NOT DETECTED.  The SARS-CoV-2 RNA is generally detectable in upper respiratory specimens during the acute phase of infection. The lowest concentration of SARS-CoV-2 viral copies this assay can detect is 138 copies/mL. A negative result does not preclude SARS-Cov-2 infection and should not be used as the sole basis for treatment or other patient management decisions. A negative result may occur with  improper specimen collection/handling, submission of specimen other than nasopharyngeal swab, presence  of viral mutation(s) within the areas targeted by this assay, and inadequate number of viral copies(<138 copies/mL). A negative result must be combined with clinical observations, patient history, and epidemiological information. The expected  result is Negative.  Fact Sheet for Patients:  BloggerCourse.com  Fact Sheet for Healthcare Providers:  SeriousBroker.it  This test is no t yet approved or cleared by the [place] FDA and  has been authorized for detection and/or diagnosis of SARS-CoV-2 by FDA under an Emergency Use Authorization (EUA). This EUA will remain  in effect (meaning this test can be used) for the duration of the COVID-19 declaration under Section 564(b)(1) of the Act, 21 U.S.C.section 360bbb-3(b)(1), unless the authorization is terminated  or revoked sooner.       Influenza A by PCR NEGATIVE NEGATIVE Final   Influenza B by PCR NEGATIVE NEGATIVE Final    Comment: (NOTE) The Xpert Xpress SARS-CoV-2/FLU/RSV plus assay is intended as an aid in the diagnosis of influenza from Nasopharyngeal swab specimens and should not be used as a sole basis for treatment. Nasal washings and aspirates are unacceptable for Xpert Xpress SARS-CoV-2/FLU/RSV testing.  Fact Sheet for Patients: BloggerCourse.com  Fact Sheet for Healthcare Providers: SeriousBroker.it  This test is not yet approved or cleared by the [place] FDA and has been authorized for detection and/or diagnosis of SARS-CoV-2 by FDA under an Emergency Use Authorization (EUA). This EUA will remain in effect (meaning this test can be used) for the duration of the COVID-19 declaration under Section 564(b)(1) of the Act, 21 U.S.C. section 360bbb-3(b)(1), unless the authorization is terminated or revoked.  Performed at [facility], [address] [place], [place] [address]   MRSA Next Gen by PCR, Nasal     Status: None   Collection Time: 06/14/21  5:36 PM   Specimen: Nasopharyngeal Swab; Nasal Swab  Result Value Ref Range Status   MRSA by PCR Next Gen NOT DETECTED NOT DETECTED Final    Comment: (NOTE) The GeneXpert MRSA Assay (FDA  approved for NASAL specimens only), is one component of a comprehensive MRSA colonization surveillance program. It is not intended to diagnose MRSA infection nor to guide or monitor treatment for MRSA infections. Test performance is not FDA approved in patients less than 51 years old. Performed at [facility], [address], [place], [place] [address]   Resp Panel by RT-PCR (Flu A&B, Covid) Nasopharyngeal Swab     Status: None   Collection Time: 06/19/21  4:29 PM   Specimen: Nasopharyngeal Swab; Nasopharyngeal(NP) swabs in vial transport medium  Result Value Ref Range Status   SARS Coronavirus 2 by RT PCR NEGATIVE NEGATIVE Final    Comment: (NOTE) SARS-CoV-2 target nucleic acids are NOT DETECTED.  The SARS-CoV-2 RNA is generally detectable in upper respiratory specimens during the acute phase of infection. The lowest concentration of SARS-CoV-2 viral copies this assay can detect is 138 copies/mL. A negative result does not preclude SARS-Cov-2 infection and should not be used as the sole basis for treatment or other patient management decisions. A negative result may occur with  improper specimen collection/handling, submission of specimen other than nasopharyngeal swab, presence of viral mutation(s) within the areas targeted by this assay, and inadequate number of viral copies(<138 copies/mL). A negative result must be combined with clinical observations, patient history, and epidemiological information. The expected result is Negative.  Fact Sheet for Patients:  BloggerCourse.com  Fact Sheet for Healthcare Providers:  SeriousBroker.it  This test is no t yet  approved or cleared by the [place] and  has been authorized for detection and/or diagnosis of SARS-CoV-2 by FDA under an Emergency Use Authorization (EUA). This EUA will remain  in effect (meaning this test can be used) for the duration of the COVID-19  declaration under Section 564(b)(1) of the Act, 21 U.S.C.section 360bbb-3(b)(1), unless the authorization is terminated  or revoked sooner.       Influenza A by PCR NEGATIVE NEGATIVE Final   Influenza B by PCR NEGATIVE NEGATIVE Final    Comment: (NOTE) The Xpert Xpress SARS-CoV-2/FLU/RSV plus assay is intended as an aid in the diagnosis of influenza from Nasopharyngeal swab specimens and should not be used as a sole basis for treatment. Nasal washings and aspirates are unacceptable for Xpert Xpress SARS-CoV-2/FLU/RSV testing.  Fact Sheet for Patients: BloggerCourse.com  Fact Sheet for Healthcare Providers: SeriousBroker.it  This test is not yet approved or cleared by the [place] FDA and has been authorized for detection and/or diagnosis of SARS-CoV-2 by FDA under an Emergency Use Authorization (EUA). This EUA will remain in effect (meaning this test can be used) for the duration of the COVID-19 declaration under Section 564(b)(1) of the Act, 21 U.S.C. section 360bbb-3(b)(1), unless the authorization is terminated or revoked.  Performed at [facility], [address] [place], [place] [address]      Radiology Studies: CT Angio Chest PE W and/or Wo Contrast  Result Date: 06/19/2021 CLINICAL DATA:  Hemoptysis since recent cardiac catheterization and pacemaker removal last week. Chest soreness. EXAM: CT ANGIOGRAPHY CHEST WITH CONTRAST TECHNIQUE: Multidetector CT imaging of the chest was performed using the standard protocol during bolus administration of intravenous contrast. Multiplanar CT image reconstructions and MIPs were obtained to evaluate the vascular anatomy. CONTRAST:  91mL OMNIPAQUE IOHEXOL 350 MG/ML SOLN COMPARISON:  Plain film of earlier in the day. CTA chest 06/11/2021 FINDINGS: Cardiovascular: The quality of this exam for evaluation of pulmonary embolism is good. Mild motion degradation. No evidence of  pulmonary embolism. Apparent vascular filling defect on 331/7 in the right lower lobe is secondary to fluid within the endobronchial tree and is similar to on the prior. Aortic atherosclerosis. Tortuous thoracic aorta. Normal heart size, without pericardial effusion. Three vessel coronary artery calcification. Mediastinum/Nodes: No mediastinal or hilar adenopathy. Lungs/Pleura: No pleural fluid.  Advanced bullous emphysema. Bibasilar dependent airspace and less so ground-glass opacity, new since the prior CT. Upper Abdomen: Normal imaged portions of the liver, spleen, stomach pancreas, adrenal glands. Upper pole left renal 1.2 cm incompletely imaged low-density lesion is likely a cyst. Musculoskeletal: No acute osseous abnormality. Review of the MIP images confirms the above findings. IMPRESSION: 1. No evidence of pulmonary embolism. Mild motion degradation. 2. New bibasilar airspace and less so ground-glass opacity, favored to represent aspiration or infection. 3. Aortic Atherosclerosis (ICD10-I70.0) and Emphysema (ICD10-J43.9). Coronary artery atherosclerosis. Electronically Signed   By: Jeronimo Greaves M.D.   On: 06/19/2021 16:13   DG Chest Portable 1 View  Result Date: 06/19/2021 CLINICAL DATA:  Hemoptysis x Friday, pt mentioned having a cath done on 12/1 Tech wore surgical mask/gloves, pt had on maskhemoptysis EXAM: PORTABLE CHEST 1 VIEW COMPARISON:  None. FINDINGS: Normal mediastinum and cardiac silhouette. Normal pulmonary vasculature. No evidence of effusion, infiltrate, or pneumothorax. No acute bony abnormality. IMPRESSION: No acute cardiopulmonary process. Electronically Signed   By: Genevive Bi M.D.   On: 06/19/2021 14:22     LOS: 1 day   Lanae Boast, MD [facility]  06/20/2021,  8:19 AM

## 2021-06-20 NOTE — Progress Notes (Signed)
New admission to room 5N21. Patient is A&O*4. Able to verbalize her needs to the staff. No CO pain or discomfort at this time. VS checked and bed kept in low position and locked. Call bell in reach.

## 2021-06-21 ENCOUNTER — Encounter (HOSPITAL_COMMUNITY): Payer: Self-pay | Admitting: Internal Medicine

## 2021-06-21 DIAGNOSIS — R042 Hemoptysis: Secondary | ICD-10-CM | POA: Diagnosis not present

## 2021-06-21 LAB — BASIC METABOLIC PANEL
Anion gap: 8 (ref 5–15)
BUN: 14 mg/dL (ref 8–23)
CO2: 38 mmol/L — ABNORMAL HIGH (ref 22–32)
Calcium: 8.5 mg/dL — ABNORMAL LOW (ref 8.9–10.3)
Chloride: 88 mmol/L — ABNORMAL LOW (ref 98–111)
Creatinine, Ser: 0.7 mg/dL (ref 0.44–1.00)
GFR, Estimated: >60 mL/min (ref >60)
Glucose, Bld: 99 mg/dL (ref 70–99)
Potassium: 4.2 mmol/L (ref 3.5–5.1)
Sodium: 134 mmol/L — ABNORMAL LOW (ref 135–145)

## 2021-06-21 LAB — CBC
HCT: 32.7 % — ABNORMAL LOW (ref 36.0–46.0)
Hemoglobin: 10.4 g/dL — ABNORMAL LOW (ref 12.0–15.0)
MCH: 31.8 pg (ref 26.0–34.0)
MCHC: 31.8 g/dL (ref 30.0–36.0)
MCV: 100 fL (ref 80.0–100.0)
Platelets: 263 10*3/uL (ref 150–400)
RBC: 3.27 MIL/uL — ABNORMAL LOW (ref 3.87–5.11)
RDW: 13.5 % (ref 11.5–15.5)
WBC: 9.8 10*3/uL (ref 4.0–10.5)
nRBC: 0 % (ref 0.0–0.2)

## 2021-06-21 LAB — GLUCOSE, CAPILLARY
Glucose-Capillary: 114 mg/dL — ABNORMAL HIGH (ref 70–99)
Glucose-Capillary: 117 mg/dL — ABNORMAL HIGH (ref 70–99)
Glucose-Capillary: 137 mg/dL — ABNORMAL HIGH (ref 70–99)
Glucose-Capillary: 116 mg/dL — ABNORMAL HIGH (ref 70–99)

## 2021-06-21 MED ORDER — REVEFENACIN 175 MCG/3ML IN SOLN
175.0000 ug | Freq: Every day | RESPIRATORY_TRACT | Status: DC
Start: 2021-06-21 — End: 2021-06-23
  Administered 2021-06-22 – 2021-06-23 (×2): 175 ug via RESPIRATORY_TRACT
  Filled 2021-06-21 (×3): qty 3

## 2021-06-21 MED ORDER — PANTOPRAZOLE SODIUM 40 MG PO TBEC
40.0000 mg | DELAYED_RELEASE_TABLET | Freq: Every day | ORAL | Status: DC
  Administered 2021-06-21 – 2021-06-26 (×6): 40 mg via ORAL
  Filled 2021-06-21 (×5): qty 1

## 2021-06-21 MED ORDER — ALBUTEROL SULFATE (2.5 MG/3ML) 0.083% IN NEBU
2.5000 mg | INHALATION_SOLUTION | RESPIRATORY_TRACT | Status: DC | PRN
  Administered 2021-06-21 – 2021-06-23 (×3): 2.5 mg via RESPIRATORY_TRACT
  Filled 2021-06-21 (×3): qty 3

## 2021-06-21 MED ORDER — SALINE SPRAY 0.65 % NA SOLN
2.0000 | Freq: Two times a day (BID) | NASAL | Status: DC
  Administered 2021-06-21 – 2021-06-26 (×10): 2 via NASAL
  Filled 2021-06-21 (×2): qty 44

## 2021-06-21 MED ORDER — ALBUTEROL SULFATE (2.5 MG/3ML) 0.083% IN NEBU
2.5000 mg | INHALATION_SOLUTION | Freq: Four times a day (QID) | RESPIRATORY_TRACT | Status: DC
Start: 2021-06-21 — End: 2021-06-21
  Administered 2021-06-21: 2.5 mg via RESPIRATORY_TRACT
  Filled 2021-06-21: qty 3

## 2021-06-21 MED ORDER — FLUTICASONE PROPIONATE 50 MCG/ACT NA SUSP
2.0000 | Freq: Every day | NASAL | Status: DC
Start: 2021-06-21 — End: 2021-06-21
  Filled 2021-06-21: qty 16

## 2021-06-21 MED ORDER — FLUTICASONE PROPIONATE 50 MCG/ACT NA SUSP
1.0000 | Freq: Every day | NASAL | Status: DC | PRN
  Administered 2021-06-21 – 2021-06-22 (×2): 1 via NASAL

## 2021-06-21 MED ORDER — POLYETHYLENE GLYCOL 3350 17 G PO PACK
17.0000 g | PACK | Freq: Every day | ORAL | Status: DC
  Administered 2021-06-21 – 2021-06-26 (×6): 17 g via ORAL
  Filled 2021-06-21 (×6): qty 1

## 2021-06-21 NOTE — Plan of Care (Signed)

## 2021-06-21 NOTE — Evaluation (Signed)
Physical Therapy Evaluation Patient Details Name: Rebecca [name] MRN: [id number] DOB: [birth year] Today's Date: 06/21/2021  History of Present Illness  80 yo female presents to [facility] on 12/6 for hemoptysis since heart cath (procedure terminated due to third degree heart block, temporary pacemaker needed) 12/1. Workup for respiratory failure suspect due to COPD and PNA. 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  Clinical Impression   Pt presents with generalized weakness, impaired standing balance at increased risk of falls, dyspnea on exertion, and decreased activity tolerance vs baseline. Pt to benefit from acute PT to address deficits. Pt ambulated room distance with occasional steadying assist and use of RW, at baseline pt walks with rollator without assist. Pt wants to d/c home with increased support (HH aide, HH services), per RN family requests SNF consult. PT feels that if pt wants to d/c home, she will need guaranteed increased support and to be functioning at mod I level given pt lives alone. PT to progress mobility as tolerated, and will continue to follow acutely.         Recommendations for follow up therapy are one component of a multi-disciplinary discharge planning process, led by the attending physician.  Recommendations may be updated based on patient status, additional functional criteria and insurance authorization.  Follow Up Recommendations Skilled nursing-short term rehab (<3 hours/day) (vs HHPT with HH aide; pt wants to d/c home but family expresses concern about this)    Assistance Recommended at Discharge Intermittent Supervision/Assistance  Functional Status Assessment Patient has had a recent decline in their functional status and demonstrates the ability to make significant improvements in function in a reasonable and  predictable amount of time.  Equipment Recommendations  None recommended by PT    Recommendations for Other Services       Precautions / Restrictions Precautions Precautions: Fall Restrictions Weight Bearing Restrictions: No      Mobility  Bed Mobility Overal bed mobility: Needs Assistance             General bed mobility comments: up in chair    Transfers Overall transfer level: Needs assistance Equipment used: Rolling walker (2 wheels) Transfers: Sit to/from Stand Sit to Stand: Min guard           General transfer comment: for safety, slow to rise and steady. STS x2, from recliner and toilet.    Ambulation/Gait Ambulation/Gait assistance: Min guard;Min assist Gait Distance (Feet): 30 Feet (+10 - back from bathroom) Assistive device: Rolling walker (2 wheels) Gait Pattern/deviations: Step-through pattern;Decreased stride length;Trunk flexed Gait velocity: decr     General Gait Details: close guard for safety, cues for closer proximity of RW. x1 period of PT steadying assist for posterior unsteadiness. on 6LO2 via [place] during gait given O2 tank does not have 5L level  Stairs            Wheelchair Mobility    Modified Rankin (Stroke Patients Only)       Balance Overall balance assessment: Needs assistance;History of Falls Sitting-balance support: No upper extremity supported;Feet supported Sitting balance-Leahy Scale: Fair     Standing balance support: Bilateral upper extremity supported;During functional activity;Reliant on assistive device for balance Standing balance-Leahy Scale: Poor                               Pertinent Vitals/Pain Pain Assessment: Faces Faces  Pain Scale: No hurt Pain Intervention(s): Monitored during session    Home Living Family/patient expects to be discharged to:: Private residence Living Arrangements: Alone Available Help at Discharge: Available PRN/intermittently;Home health;Personal care  attendant Type of Home: Independent living facility Home Access: Level entry       Home Layout: One level Home Equipment: Rollator (4 wheels);Shower seat;Grab bars - toilet;Grab bars - tub/shower      Prior Function Prior Level of Function : Independent/Modified Independent             Mobility Comments: pt reports walking with rollator, also has wheelchair for further distances. Pt takes self to and from dining hall ADLs Comments: Not currently receiving assist, states she is working on caregiver assist provided through her facility     Hand Dominance   Dominant Hand: Right    Extremity/Trunk Assessment   Upper Extremity Assessment Upper Extremity Assessment: Defer to OT evaluation    Lower Extremity Assessment Lower Extremity Assessment: Generalized weakness    Cervical / Trunk Assessment Cervical / Trunk Assessment: Kyphotic  Communication   Communication: No difficulties  Cognition Arousal/Alertness: Awake/alert Behavior During Therapy: WFL for tasks assessed/performed Overall Cognitive Status: Within Functional Limits for tasks assessed                                          General Comments General comments (skin integrity, edema, etc.): DOE 2/4    Exercises     Assessment/Plan    PT Assessment Patient needs continued PT services  PT Problem List Decreased strength;Decreased mobility;Decreased safety awareness;Decreased balance;Decreased knowledge of use of DME;Pain;Decreased activity tolerance;Decreased knowledge of precautions;Cardiopulmonary status limiting activity       PT Treatment Interventions DME instruction;Therapeutic activities;Gait training;Therapeutic exercise;Patient/family education;Functional mobility training;Neuromuscular re-education;Balance training    PT Goals (Current goals can be found in the Care Plan section)  Acute Rehab PT Goals Patient Stated Goal: home PT Goal Formulation: With patient Time For Goal  Achievement: 07/05/21 Potential to Achieve Goals: Good    Frequency Min 3X/week   Barriers to discharge Decreased caregiver support      Co-evaluation               AM-PAC PT "6 Clicks" Mobility  Outcome Measure Help needed turning from your back to your side while in a flat bed without using bedrails?: A Little Help needed moving from lying on your back to sitting on the side of a flat bed without using bedrails?: A Little Help needed moving to and from a bed to a chair (including a wheelchair)?: A Little Help needed standing up from a chair using your arms (e.g., wheelchair or bedside chair)?: A Little Help needed to walk in hospital room?: A Little Help needed climbing 3-5 steps with a railing? : A Lot 6 Click Score: 17    End of Session Equipment Utilized During Treatment: Oxygen Activity Tolerance: Patient tolerated treatment well Patient left: in chair;with call bell/phone within reach;Other (comment) (no chair alarm present in room, pt already up in cahir upon PT arrival, states she presses call button and waits for assist prior to mobilizing) Nurse Communication: Mobility status PT Visit Diagnosis: Unsteadiness on feet (R26.81);Muscle weakness (generalized) (M62.81)    Time: 1610-9604 PT Time Calculation (min) (ACUTE ONLY): 18 min   Charges:   PT Evaluation $PT Eval Low Complexity: 1 Low  Marye Round, PT DPT Acute Rehabilitation Services Pager [phone]  Office [phone]   Truddie Coco 06/21/2021, 5:26 PM

## 2021-06-21 NOTE — Plan of Care (Signed)

## 2021-06-21 NOTE — Progress Notes (Signed)
NAME:  Rebecca [name], MRN:  [id number], DOB:  [birth year], LOS: 2 ADMISSION DATE:  06/19/2021, CONSULTATION DATE:  06/19/21 REFERRING MD:  Dr Darl Householder, CHIEF COMPLAINT:  hemoptysis   History of Present Illness:  80 yo female presented with worsening dyspnea, sinus congestion, and cough with hemoptysis starting 12/02.  Was recently started on prednisone for COPD exacerbation as outpt.  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  She is followed by Dr. Elsworth Soho in pulmonary office for severe COPD with emphysema and chronic respiratory failure on 5 liters oxygen.  She had referral placed for palliative care assessment as outpt on 05/24/21.  Pertinent  Medical History  COPD, DM, HLD, Osteoporosis, GERD, Glaucoma, Rhinitis, systolic CHF  Studies:   Spirometry 02/05/18 >> FEV1 0.6 (31%), FEV1% 43 Echo 08/18/20 >> EF 40 to 45%, grade 1 DD CT-PA 06/19/2021 >> severe centrilobular and bullous emphysema, basilar ASD and GGO  Interim History / Subjective:  C/o sinus congestion and post nasal drip.  Says flonase dries her sinuses too much and prefers nasal irrigation.  Gets bloated and belches with meals.  SpO2 drops when this happens.  C/o acid reflux.  Had cough with sputum and some blood mixed in, but better than before.  Objective   Blood pressure (!) 144/64, pulse 94, temperature 98.3 F (36.8 C), temperature source Oral, resp. rate 18, SpO2 94 %.        Intake/Output Summary (Last 24 hours) at 06/21/2021 0845 Last data filed at 06/21/2021 0559 Gross per 24 hour  Intake 918.08 ml  Output 1000 ml  Net -81.92 ml   There were no vitals filed for this visit.  Examination:  General - alert, wearing oxygen Eyes - pupils reactive ENT - no sinus tenderness, no stridor Cardiac - regular rate/rhythm, no murmur Chest - decreased breath sounds, no wheeze Abdomen - soft, non tender, + bowel  sounds Extremities - no cyanosis, clubbing, or edema Skin - no rashes Neuro - normal strength, moves extremities, follows commands Psych - normal mood and behavior    Resolved Hospital Problem list     Assessment & Plan:   Acute on chronic hypoxic/hypercapnic respiratory failure with hemoptysis. - likely combination of pneumonia (aspiration +/- viral) and advanced COPD with emphysema - seems to be improving clinically; defer bronchoscopy (she would be very high risk for this procedure) - day 3 of Abx, currently on rocephin and doxycyline - continue pulmicort and brovana, and add yupelri - wean of prednisone as tolerated - prn albuterol - f/u CXR intermittently - adjust oxygen to keep SpO2 90 to 95% - bronchial hygiene  Chronic rhinitis. - nasal irrigation bid - change flonase to 1 spray daily prn - if this persists, then could add azelastine nasal spray  GERD. - add protonix  Bloating/belching. - explained how she can have difficulty swallowing and develop aerophagia in setting of advanced lung disease - provided advice on how to properly eat her meals  Chronic systolic CHF. - per primary team  Glaucoma. - defer adjustment of eye drops to primary team  Updated pt's daughter at bedside   Labs    CBC Latest Ref Rng & Units 06/21/2021 06/20/2021 06/19/2021  WBC 4.0 - 10.5 K/uL 9.8 11.1(H) -  Hemoglobin 12.0 - 15.0 g/dL 10.4(L) 10.6(L) 12.2  Hematocrit 36.0 - 46.0 % 32.7(L) 32.7(L) 36.0  Platelets 150 - 400 K/uL 263 259 -  CMP Latest Ref Rng & Units 06/21/2021 06/20/2021 06/19/2021  Glucose 70 - 99 mg/dL 99 124(P) -  BUN 8 - 23 mg/dL 14 15 -  Creatinine 8.09 - 1.00 mg/dL 9.83 3.82 -  Sodium 505 - 145 mmol/L 134(L) 132(L) 130(L)  Potassium 3.5 - 5.1 mmol/L 4.2 3.4(L) 3.6  Chloride 98 - 111 mmol/L 88(L) 83(L) -  CO2 22 - 32 mmol/L 38(H) 38(H) -  Calcium 8.9 - 10.3 mg/dL 3.9(J) 6.7(H) -  Total Protein 6.5 - 8.1 g/dL - - -  Total Bilirubin 0.3 - 1.2 mg/dL - - -   Alkaline Phos 38 - 126 U/L - - -  AST 15 - 41 U/L - - -  ALT 0 - 44 U/L - - -    CBG (last 3)  Recent Labs    06/20/21 1147 06/20/21 1615 06/21/21 0745  GLUCAP 126* 128* 114*   ABG    Component Value Date/Time   PHART 7.304 (L) 06/14/2021 1437   PCO2ART 91.0 (HH) 06/14/2021 1437   PO2ART 146 (H) 06/14/2021 1437   HCO3 43.3 (H) 06/19/2021 1505   TCO2 45 (H) 06/19/2021 1505   O2SAT 70.0 06/19/2021 1505   Signature:  Coralyn Helling, MD [facility] Pager - [phone] 06/21/2021, 9:02 AM

## 2021-06-21 NOTE — Progress Notes (Signed)
Patient resting with nasal cannula with no respiratory distress noted.  Bipap ordered for PRN and currently not needed at this time.  Will continue to monitor.

## 2021-06-21 NOTE — Progress Notes (Signed)
PROGRESS NOTE    Rebecca [name]  LEX:[id number] DOB: 02-Jan-1941 DOA: 06/19/2021 PCP: Farris Has, MD   Brief Narrative/Hospital Course: Rebecca [name], 80 y.o. 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  Was not prednisone since beginning of November that was increased to 40 mg from 10 during last admission and is scheduled to return to 10 mg from 12/7 In the ED on 5 L nasal cannula CT angio negative for PE but with bilateral lower field infiltrates suspicious for infection hemoglobin 11.5 g stable, PCCM was consulted and admitted  Subjective: Complains of constipation  earlier this morning patient had episode of hematemesis again. Hemoglobin stable afebrile On nasal cannula oxygen-home setting 5 L  Assessment & Plan:  Hemoptysis: New onset, still with ongoing hemoptysis in-house.  Unclear etiology-could be from infection and being managed with IV antibiotics, add Flonase, continue antitussives cough suppressants.  Pulmonary following closely if continues to be recurrent issues may need bronchoscopy although high risk given her oxygen requirement.  No significant drop in hemoglobin  Community-acquired bacterial pneumonia: CT angio no PE, but with B/L lower field infiltrates. Continue ceftriaxone, doxycycline, bronchodilators  Acute on chronic hypoxic and hypercapnic respiratory failure on 5 L nasal cannula Severe COPD/emphysema with recent exacerbation and was placed on prednisone as OP: Vbg-chronic status compensated hypercapnia.  Continue home inhalers-bronchodilators  and IV antibiotics, monitor respiratory status closely.  She is on a steroid taper from her last discharge.  Chronic rhinitis:added Flonase 1 spray daily, nasal irrigation twice daily if persistent can add azelastine  Chronic systolic CHF on Lasix 20 mg daily continue the same, volume status is stable.  She had recent left and right heart cath  in past week.Monitor daily weight intake output. 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   Mild hyponatremia mild.  Monitor  Hypertension fairly controlled on home losartan.  HLD: Continue statins  DVT prophylaxis: SCDs Start: 06/19/21 1711 Code Status:   Code Status: Full Code Family Communication: plan of care discussed with patient and the daughter at bedside. Status is: Inpatient Remains inpatient appropriate because: For ongoing management of hematemesis pneumonia Disposition: Currently not medically stable for discharge. Anticipated Disposition: TBD  Objective: Vitals last 24 hrs: Vitals:   06/21/21 0754 06/21/21 0814 06/21/21 0822 06/21/21 0905  BP:    (!) 156/79  Pulse:    97  Resp:    16  Temp:    97.7 F (36.5 C)  TempSrc:      SpO2: 94% 94% 94% 97%   Weight change:   Intake/Output Summary (Last 24 hours) at 06/21/2021 1009 Last data filed at 06/21/2021 0900 Gross per 24 hour  Intake 1158.08 ml  Output 1000 ml  Net 158.08 ml   Net IO Since Admission: 261.91 mL [06/21/21 1009]   Physical Examination: General exam: AAOx 3, elderly, not  in distress, pleasant. HEENT:Oral mucosa moist, Ear/Nose WNL grossly, dentition normal. Respiratory system: bilaterally diminished, no use of accessory muscle Cardiovascular system: S1 & S2  +, No JVD,. Gastrointestinal system: Abdomen soft,NT,ND, BS+ Nervous System:[name], awake, moving extremities and grossly nonfocal Extremities: no edema, distal peripheral pulses palpable.  Skin: No rashes,no icterus. MSK: Normal muscle bulk,tone, power   Medications reviewed:  Scheduled Meds:  arformoterol  15 mcg Nebulization BID   benzonatate  200 mg Oral BID   brimonidine  1 drop Left Eye BID   budesonide  0.5 mg Nebulization BID   docusate sodium  100 mg Oral Daily   estradiol  1 Applicatorful Vaginal QODAY   ezetimibe  10 mg Oral Daily   furosemide  20 mg Oral Daily   guaiFENesin  1,200 mg Oral BID   insulin aspart  0-15 Units Subcutaneous TID WC   losartan  50 mg Oral Daily   pantoprazole  40 mg Oral Q1200   polyethylene glycol  17 g Oral Daily   predniSONE  10 mg Oral Q breakfast   revefenacin  175 mcg Nebulization Daily   sodium chloride  2 spray Each Nare BID   Continuous Infusions:  cefTRIAXone (ROCEPHIN)  IV 1 g (06/21/21 0043)   doxycycline (VIBRAMYCIN) IV 100 mg (06/21/21 7614)   Diet Order             Diet heart healthy/carb modified Room service appropriate? Yes; Fluid consistency: Thin  Diet effective now                 Weight change:   Wt Readings from Last 3 Encounters:  06/19/21 71.2 kg  06/14/21 71.1 kg  06/05/21 73 kg     Consultants:see note  Procedures:see note Antimicrobials: Anti-infectives (From admission, onward)    Start     Dose/Rate Route Frequency Ordered Stop   06/20/21 1800  vancomycin (VANCOREADY) IVPB 750 mg/150 mL  Status:  Discontinued        750 mg 150 mL/hr over 60 Minutes Intravenous Every 24 hours 06/19/21 1659 06/19/21 1714   06/20/21 1030  doxycycline (VIBRAMYCIN) 100 mg in sodium chloride 0.9 % 250 mL IVPB        100 mg 125 mL/hr over 120 Minutes Intravenous 2 times daily 06/20/21 1026 06/25/21 0959   06/20/21 1015  doxycycline (VIBRAMYCIN) 100 mg in sodium chloride 0.9 % 250 mL IVPB  Status:  Discontinued         100 mg 125 mL/hr over 120 Minutes Intravenous Every 12 hours 06/20/21 1013 06/20/21 1026   06/20/21 1000  azithromycin (ZITHROMAX) 500 mg in sodium chloride 0.9 % 250 mL IVPB  Status:  Discontinued        500 mg 250 mL/hr over 60 Minutes Intravenous Every 24 hours 06/20/21 0959 06/20/21 1013   06/20/21 0600  ceFEPIme (MAXIPIME) 2 g in sodium chloride 0.9 % 100 mL IVPB  Status:  Discontinued        2 g 200 mL/hr over 30 Minutes Intravenous Every 12 hours 06/19/21 1659 06/19/21 1714   06/20/21 0000  cefTRIAXone (ROCEPHIN) 1 g in sodium chloride 0.9 % 100 mL IVPB        1 g 200 mL/hr over 30 Minutes Intravenous Every 24 hours 06/19/21 1714     06/19/21 1630  vancomycin (VANCOREADY) IVPB 1000 mg/200 mL  Status:  Discontinued        1,000 mg 200 mL/hr over 60 Minutes Intravenous  Once 06/19/21 1623 06/19/21 1627  06/19/21 1630  vancomycin (VANCOREADY) IVPB 1500 mg/300 mL  Status:  Discontinued        1,500 mg 150 mL/hr over 120 Minutes Intravenous  Once 06/19/21 1627 06/19/21 1714   06/19/21 1630  ceFEPIme (MAXIPIME) 2 g in sodium chloride 0.9 % 100 mL IVPB  Status:  Discontinued        2 g 200 mL/hr over 30 Minutes Intravenous  Once 06/19/21 1627 06/19/21 1751      Culture/Microbiology    Component Value Date/Time   SDES BLOOD LEFT HAND 06/19/2021 1704   SPECREQUEST  06/19/2021 1704    BOTTLES DRAWN AEROBIC AND ANAEROBIC Blood Culture results may not be optimal due to an inadequate volume of blood received in culture bottles   CULT  06/19/2021 1704    NO GROWTH < 24 HOURS Performed at [facility], [address], [place], [place] [address]    REPTSTATUS PENDING 06/19/2021 1704    Other culture-see note  Unresulted Labs (From admission, onward)     Start     Ordered   06/21/21 0500  Basic metabolic panel  Daily,   R     Question:  Specimen collection method  Answer:  Lab=Lab collect   06/20/21 0959   06/21/21 0500  CBC  Daily,   R     Question:  Specimen collection  method  Answer:  Lab=Lab collect   06/20/21 0959   06/19/21 1710  Hemoglobin A1c  Once,   R       Comments: To assess prior glycemic control    06/19/21 1712          Data Reviewed: I have personally reviewed following labs and imaging studies CBC: Recent Labs  Lab 06/15/21 0237 06/19/21 1415 06/19/21 1505 06/20/21 0615 06/21/21 0410  WBC 11.8* 11.4*  --  11.1* 9.8  NEUTROABS 9.9* 10.3*  --   --   --   HGB 10.2* 11.5* 12.2 10.6* 10.4*  HCT 33.1* 35.1* 36.0 32.7* 32.7*  MCV 104.7* 98.0  --  97.6 100.0  PLT 213 278  --  259 263   Basic Metabolic Panel: Recent Labs  Lab 06/15/21 0237 06/19/21 1415 06/19/21 1419 06/19/21 1505 06/20/21 0615 06/21/21 0410  NA 139 130*  --  130* 132* 134*  K 4.2 3.5  --  3.6 3.4* 4.2  CL 93* 83*  --   --  83* 88*  CO2 39* 35*  --   --  38* 38*  GLUCOSE 122* 165*  --   --  105* 99  BUN 17 16  --   --  15 14  CREATININE 0.86 0.73  --   --  0.79 0.70  CALCIUM 8.9 9.7  --   --  8.8* 8.5*  MG 1.7  --  1.8  --   --   --    GFR: Estimated Creatinine Clearance: 51.8 mL/min (by C-G formula based on SCr of 0.7 mg/dL). Liver Function Tests: Recent Labs  Lab 06/19/21 1415  AST 34  ALT 30  ALKPHOS 50  BILITOT 0.9  PROT 6.7  ALBUMIN 3.8   No results for input(s): LIPASE, AMYLASE in the last 168 hours. No results for input(s): AMMONIA in the last 168 hours. Coagulation Profile: Recent Labs  Lab 06/20/21 1405  INR 1.0   Cardiac Enzymes: No results for input(s): CKTOTAL, CKMB, CKMBINDEX, TROPONINI in the last 168 hours. BNP (last 3 results) Recent Labs    04/10/21 1518  PROBNP  179   HbA1C: No results for input(s): HGBA1C in the last 72 hours. CBG: Recent Labs  Lab 06/15/21 1135 06/20/21 0750 06/20/21 1147 06/20/21 1615 06/21/21 0745  GLUCAP 134* 101* 126* 128* 114*   Lipid Profile: No results for input(s): CHOL, HDL, LDLCALC, TRIG, CHOLHDL, LDLDIRECT in the last 72 hours. Thyroid Function Tests: No results for  input(s): TSH, T4TOTAL, FREET4, T3FREE, THYROIDAB in the last 72 hours. Anemia Panel: No results for input(s): VITAMINB12, FOLATE, FERRITIN, TIBC, IRON, RETICCTPCT in the last 72 hours. Sepsis Labs: Recent Labs  Lab 06/19/21 1640  LATICACIDVEN 0.9    Recent Results (from the past 240 hour(s))  Resp Panel by RT-PCR (Flu A&B, Covid) Nasopharyngeal Swab     Status: None   Collection Time: 06/14/21  5:36 PM   Specimen: Nasopharyngeal Swab; Nasopharyngeal(NP) swabs in vial transport medium  Result Value Ref Range Status   SARS Coronavirus 2 by RT PCR NEGATIVE NEGATIVE Final    Comment: (NOTE) SARS-CoV-2 target nucleic acids are NOT DETECTED.  The SARS-CoV-2 RNA is generally detectable in upper respiratory specimens during the acute phase of infection. The lowest concentration of SARS-CoV-2 viral copies this assay can detect is 138 copies/mL. A negative result does not preclude SARS-Cov-2 infection and should not be used as the sole basis for treatment or other patient management decisions. A negative result may occur with  improper specimen collection/handling, submission of specimen other than nasopharyngeal swab, presence of viral mutation(s) within the areas targeted by this assay, and inadequate number of viral copies(<138 copies/mL). A negative result must be combined with clinical observations, patient history, and epidemiological information. The expected result is Negative.  Fact Sheet for Patients:  BloggerCourse.com  Fact Sheet for Healthcare Providers:  SeriousBroker.it  This test is no t yet approved or cleared by the [place] FDA and  has been authorized for detection and/or diagnosis of SARS-CoV-2 by FDA under an Emergency Use Authorization (EUA). This EUA will remain  in effect (meaning this test can be used) for the duration of the COVID-19 declaration under Section 564(b)(1) of the Act, 21 U.S.C.section  360bbb-3(b)(1), unless the authorization is terminated  or revoked sooner.       Influenza A by PCR NEGATIVE NEGATIVE Final   Influenza B by PCR NEGATIVE NEGATIVE Final    Comment: (NOTE) The Xpert Xpress SARS-CoV-2/FLU/RSV plus assay is intended as an aid in the diagnosis of influenza from Nasopharyngeal swab specimens and should not be used as a sole basis for treatment. Nasal washings and aspirates are unacceptable for Xpert Xpress SARS-CoV-2/FLU/RSV testing.  Fact Sheet for Patients: BloggerCourse.com  Fact Sheet for Healthcare Providers: SeriousBroker.it  This test is not yet approved or cleared by the [place] FDA and has been authorized for detection and/or diagnosis of SARS-CoV-2 by FDA under an Emergency Use Authorization (EUA). This EUA will remain in effect (meaning this test can be used) for the duration of the COVID-19 declaration under Section 564(b)(1) of the Act, 21 U.S.C. section 360bbb-3(b)(1), unless the authorization is terminated or revoked.  Performed at [facility], [address], [place], [place] [address]   MRSA Next Gen by PCR, Nasal     Status: None   Collection Time: 06/14/21  5:36 PM   Specimen: Nasopharyngeal Swab; Nasal Swab  Result Value Ref Range Status   MRSA by PCR Next Gen NOT DETECTED NOT DETECTED Final    Comment: (NOTE) The GeneXpert MRSA Assay (FDA approved for NASAL specimens only), is one  component of a comprehensive MRSA colonization surveillance program. It is not intended to diagnose MRSA infection nor to guide or monitor treatment for MRSA infections. Test performance is not FDA approved in patients less than 11 years old. Performed at [facility], [address], [place], [place] [address]   Resp Panel by RT-PCR (Flu A&B, Covid) Nasopharyngeal Swab     Status: None   Collection Time: 06/19/21  4:29 PM   Specimen: Nasopharyngeal Swab; Nasopharyngeal(NP)  swabs in vial transport medium  Result Value Ref Range Status   SARS Coronavirus 2 by RT PCR NEGATIVE NEGATIVE Final    Comment: (NOTE) SARS-CoV-2 target nucleic acids are NOT DETECTED.  The SARS-CoV-2 RNA is generally detectable in upper respiratory specimens during the acute phase of infection. The lowest concentration of SARS-CoV-2 viral copies this assay can detect is 138 copies/mL. A negative result does not preclude SARS-Cov-2 infection and should not be used as the sole basis for treatment or other patient management decisions. A negative result may occur with  improper specimen collection/handling, submission of specimen other than nasopharyngeal swab, presence of viral mutation(s) within the areas targeted by this assay, and inadequate number of viral copies(<138 copies/mL). A negative result must be combined with clinical observations, patient history, and epidemiological information. The expected result is Negative.  Fact Sheet for Patients:  BloggerCourse.com  Fact Sheet for Healthcare Providers:  SeriousBroker.it  This test is no t yet approved or cleared by the [place] FDA and  has been authorized for detection and/or diagnosis of SARS-CoV-2 by FDA under an Emergency Use Authorization (EUA). This EUA will remain  in effect (meaning this test can be used) for the duration of the COVID-19 declaration under Section 564(b)(1) of the Act, 21 U.S.C.section 360bbb-3(b)(1), unless the authorization is terminated  or revoked sooner.       Influenza A by PCR NEGATIVE NEGATIVE Final   Influenza B by PCR NEGATIVE NEGATIVE Final    Comment: (NOTE) The Xpert Xpress SARS-CoV-2/FLU/RSV plus assay is intended as an aid in the diagnosis of influenza from Nasopharyngeal swab specimens and should not be used as a sole basis for treatment. Nasal washings and aspirates are unacceptable for Xpert Xpress  SARS-CoV-2/FLU/RSV testing.  Fact Sheet for Patients: BloggerCourse.com  Fact Sheet for Healthcare Providers: SeriousBroker.it  This test is not yet approved or cleared by the [place] FDA and has been authorized for detection and/or diagnosis of SARS-CoV-2 by FDA under an Emergency Use Authorization (EUA). This EUA will remain in effect (meaning this test can be used) for the duration of the COVID-19 declaration under Section 564(b)(1) of the Act, 21 U.S.C. section 360bbb-3(b)(1), unless the authorization is terminated or revoked.  Performed at [facility], [address], [place], [place] [address]   Blood culture (routine x 2)     Status: None (Preliminary result)   Collection Time: 06/19/21  4:40 PM   Specimen: BLOOD  Result Value Ref Range Status   Specimen Description BLOOD LEFT ANTECUBITAL  Final   Special Requests   Final    BOTTLES DRAWN AEROBIC AND ANAEROBIC Blood Culture adequate volume   Culture   Final    NO GROWTH < 24 HOURS Performed at [facility], [address], [place], [place] [address]    Report Status PENDING  Incomplete  Blood culture (routine x 2)     Status: None (Preliminary result)   Collection Time: 06/19/21  5:04 PM   Specimen: BLOOD  LEFT HAND  Result Value Ref Range Status   Specimen Description BLOOD LEFT HAND  Final   Special Requests   Final    BOTTLES DRAWN AEROBIC AND ANAEROBIC Blood Culture results may not be optimal due to an inadequate volume of blood received in culture bottles   Culture   Final    NO GROWTH < 24 HOURS Performed at [facility], [address], [place], [place] [address]    Report Status PENDING  Incomplete     Radiology Studies: CT Angio Chest PE W and/or Wo Contrast  Result Date: 06/19/2021 CLINICAL DATA:  Hemoptysis since recent cardiac catheterization and pacemaker removal last week. Chest soreness. EXAM: CT ANGIOGRAPHY CHEST WITH  CONTRAST TECHNIQUE: Multidetector CT imaging of the chest was performed using the standard protocol during bolus administration of intravenous contrast. Multiplanar CT image reconstructions and MIPs were obtained to evaluate the vascular anatomy. CONTRAST:  75mL OMNIPAQUE IOHEXOL 350 MG/ML SOLN COMPARISON:  Plain film of earlier in the day. CTA chest 06/11/2021 FINDINGS: Cardiovascular: The quality of this exam for evaluation of pulmonary embolism is good. Mild motion degradation. No evidence of pulmonary embolism. Apparent vascular filling defect on 331/7 in the right lower lobe is secondary to fluid within the endobronchial tree and is similar to on the prior. Aortic atherosclerosis. Tortuous thoracic aorta. Normal heart size, without pericardial effusion. Three vessel coronary artery calcification. Mediastinum/Nodes: No mediastinal or hilar adenopathy. Lungs/Pleura: No pleural fluid.  Advanced bullous emphysema. Bibasilar dependent airspace and less so ground-glass opacity, new since the prior CT. Upper Abdomen: Normal imaged portions of the liver, spleen, stomach pancreas, adrenal glands. Upper pole left renal 1.2 cm incompletely imaged low-density lesion is likely a cyst. Musculoskeletal: No acute osseous abnormality. Review of the MIP images confirms the above findings. IMPRESSION: 1. No evidence of pulmonary embolism. Mild motion degradation. 2. New bibasilar airspace and less so ground-glass opacity, favored to represent aspiration or infection. 3. Aortic Atherosclerosis (ICD10-I70.0) and Emphysema (ICD10-J43.9). Coronary artery atherosclerosis. Electronically Signed   By: Jeronimo Greaves M.D.   On: 06/19/2021 16:13   DG Chest Portable 1 View  Result Date: 06/19/2021 CLINICAL DATA:  Hemoptysis x Friday, pt mentioned having a cath done on 12/1 Tech wore surgical mask/gloves, pt had on maskhemoptysis EXAM: PORTABLE CHEST 1 VIEW COMPARISON:  None. FINDINGS: Normal mediastinum and cardiac silhouette. Normal  pulmonary vasculature. No evidence of effusion, infiltrate, or pneumothorax. No acute bony abnormality. IMPRESSION: No acute cardiopulmonary process. Electronically Signed   By: Genevive Bi M.D.   On: 06/19/2021 14:22     LOS: 2 days   Lanae Boast, MD [facility]  06/21/2021, 10:09 AM

## 2021-06-21 NOTE — Plan of Care (Signed)

## 2021-06-21 NOTE — Progress Notes (Signed)
Pt coughed up a quarter size fresh blood this morning. Pt in no apparent distress. Daughter at the bedside.

## 2021-06-22 DIAGNOSIS — R042 Hemoptysis: Secondary | ICD-10-CM | POA: Diagnosis not present

## 2021-06-22 LAB — CBC
HCT: 33.2 % — ABNORMAL LOW (ref 36.0–46.0)
Hemoglobin: 10.8 g/dL — ABNORMAL LOW (ref 12.0–15.0)
MCH: 32.6 pg (ref 26.0–34.0)
MCHC: 32.5 g/dL (ref 30.0–36.0)
MCV: 100.3 fL — ABNORMAL HIGH (ref 80.0–100.0)
Platelets: 242 10*3/uL (ref 150–400)
RBC: 3.31 MIL/uL — ABNORMAL LOW (ref 3.87–5.11)
RDW: 13.4 % (ref 11.5–15.5)
WBC: 9.2 10*3/uL (ref 4.0–10.5)
nRBC: 0 % (ref 0.0–0.2)

## 2021-06-22 LAB — BASIC METABOLIC PANEL
Anion gap: 10 (ref 5–15)
BUN: 17 mg/dL (ref 8–23)
CO2: 34 mmol/L — ABNORMAL HIGH (ref 22–32)
Calcium: 8.3 mg/dL — ABNORMAL LOW (ref 8.9–10.3)
Chloride: 91 mmol/L — ABNORMAL LOW (ref 98–111)
Creatinine, Ser: 0.66 mg/dL (ref 0.44–1.00)
GFR, Estimated: >60 mL/min (ref >60)
Glucose, Bld: 98 mg/dL (ref 70–99)
Potassium: 3.2 mmol/L — ABNORMAL LOW (ref 3.5–5.1)
Sodium: 135 mmol/L (ref 135–145)

## 2021-06-22 LAB — GLUCOSE, CAPILLARY
Glucose-Capillary: 93 mg/dL (ref 70–99)
Glucose-Capillary: 127 mg/dL — ABNORMAL HIGH (ref 70–99)
Glucose-Capillary: 102 mg/dL — ABNORMAL HIGH (ref 70–99)
Glucose-Capillary: 137 mg/dL — ABNORMAL HIGH (ref 70–99)

## 2021-06-22 MED ORDER — DOXYCYCLINE HYCLATE 100 MG PO TABS
100.0000 mg | ORAL_TABLET | Freq: Two times a day (BID) | ORAL | Status: AC
  Administered 2021-06-22 – 2021-06-25 (×6): 100 mg via ORAL
  Filled 2021-06-22 (×6): qty 1

## 2021-06-22 MED ORDER — BISACODYL 10 MG RE SUPP
10.0000 mg | Freq: Every day | RECTAL | Status: DC | PRN
  Administered 2021-06-22: 10 mg via RECTAL
  Filled 2021-06-22: qty 1

## 2021-06-22 MED ORDER — POTASSIUM CHLORIDE CRYS ER 20 MEQ PO TBCR
40.0000 meq | EXTENDED_RELEASE_TABLET | Freq: Once | ORAL | Status: AC
  Administered 2021-06-22: 40 meq via ORAL
  Filled 2021-06-22: qty 2

## 2021-06-22 MED ORDER — PREDNISONE 5 MG PO TABS
5.0000 mg | ORAL_TABLET | Freq: Every day | ORAL | Status: DC
  Administered 2021-06-23 – 2021-06-26 (×4): 5 mg via ORAL
  Filled 2021-06-22 (×4): qty 1

## 2021-06-22 MED ORDER — POTASSIUM CHLORIDE CRYS ER 20 MEQ PO TBCR
20.0000 meq | EXTENDED_RELEASE_TABLET | Freq: Every day | ORAL | Status: DC
Start: 2021-06-22 — End: 2021-06-23
  Administered 2021-06-22 – 2021-06-23 (×2): 20 meq via ORAL
  Filled 2021-06-22 (×2): qty 1

## 2021-06-22 NOTE — Care Management Important Message (Signed)
Important Message  Patient Details  Name: Rebecca [name] MRN: [id number] Date of Birth: Feb 01, 1941   Medicare Important Message Given:  Yes     Sherilyn Banker 06/22/2021, 12:50 PM

## 2021-06-22 NOTE — Progress Notes (Signed)
NAME:  Rebecca [name], MRN:  [id number], DOB:  March 29, 1941, LOS: 3 ADMISSION DATE:  06/19/2021, CONSULTATION DATE:  06/19/21 REFERRING MD:  Dr Silverio Lay, CHIEF COMPLAINT:  hemoptysis   History of Present Illness:  80 yo female presented with worsening dyspnea, sinus congestion, and cough with hemoptysis starting 12/02.  Was recently started on prednisone for COPD exacerbation as outpt.  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  She is followed by Dr. Vassie Loll in pulmonary office for severe COPD with emphysema and chronic respiratory failure on 5 liters oxygen.  She had referral placed for palliative care assessment as outpt on 05/24/21.  Pertinent  Medical History  COPD, DM, HLD, Osteoporosis, GERD, Glaucoma, Rhinitis, systolic CHF  Studies:   Spirometry 02/05/18 >> FEV1 0.6 (31%), FEV1% 43 Echo 08/18/20 >> EF 40 to 45%, grade 1 DD CT-PA 06/19/2021 >> severe centrilobular and bullous emphysema, basilar ASD and GGO  Interim History / Subjective:  Reflux and sinus congestion better.  Walked in her room yesterday with PT.  Felt shaky but breathing was okay.  Coughing up dark blood clots.  Not having chest pain or wheeze.  C/o constipation >> says that she eats prunes at home at this helps.  Objective   Blood pressure (!) 126/58, pulse 87, temperature 98.3 F (36.8 C), resp. rate 18, height 5\' 2"  (1.575 m), weight 72 kg, SpO2 93 %.        Intake/Output Summary (Last 24 hours) at 06/22/2021 14/03/2021 Last data filed at 06/21/2021 1634 Gross per 24 hour  Intake 250 ml  Output 1200 ml  Net -950 ml   Filed Weights   06/20/21 2136  Weight: 72 kg    Examination:  General - alert Eyes - pupils reactive ENT - no sinus tenderness, no stridor Cardiac - regular rate/rhythm, no murmur Chest - decreased BS, no wheeze Abdomen - soft, non tender, + bowel sounds Extremities - no cyanosis, clubbing,  or edema Skin - no rashes Neuro - normal strength, moves extremities, follows commands Psych - normal mood and behavior  Resolved Hospital Problem list     Assessment & Plan:   Acute on chronic hypoxic/hypercapnic respiratory failure with hemoptysis. - likely combination of pneumonia (aspiration +/- viral) and advanced COPD with emphysema - seems to be improving clinically; defer bronchoscopy (she would be very high risk for this procedure) - suspect what she is coughing up now is old blood - day 4 of Abx, currently on rocephin and doxycycline - f/u CXR 12/10 - change prednisone to 5 mg daily on 12/10 and wean off as tolerated - continue yupelri, pulmicort, brovana - prn albuterol - adjust oxygen to keep SpO2 90 to 95% - bronchial hygiene  Chronic rhinitis. - nasal irrigation bid with prn flonase - if this persists, then could add azelastine nasal spray  GERD. - continue protonix  Bloating/belching. - explained how she can have difficulty swallowing and develop aerophagia in setting of advanced lung disease - provided advice on how to properly eat her meals  Constipation. - advised that her family could bring in prunes from home  Chronic systolic CHF. - per primary team  Glaucoma. - defer adjustment of eye drops to primary team  Updated family at bedside  Labs    CBC Latest Ref Rng & Units 06/22/2021 06/21/2021 06/20/2021  WBC 4.0 - 10.5 K/uL 9.2 9.8 11.1(H)  Hemoglobin 12.0 - 15.0  g/dL 10.8(L) 10.4(L) 10.6(L)  Hematocrit 36.0 - 46.0 % 33.2(L) 32.7(L) 32.7(L)  Platelets 150 - 400 K/uL 242 263 259    CMP Latest Ref Rng & Units 06/22/2021 06/21/2021 06/20/2021  Glucose 70 - 99 mg/dL 98 99 105(H)  BUN 8 - 23 mg/dL 17 14 15   Creatinine 0.44 - 1.00 mg/dL 0.66 0.70 0.79  Sodium 135 - 145 mmol/L 135 134(L) 132(L)  Potassium 3.5 - 5.1 mmol/L 3.2(L) 4.2 3.4(L)  Chloride 98 - 111 mmol/L 91(L) 88(L) 83(L)  CO2 22 - 32 mmol/L 34(H) 38(H) 38(H)  Calcium 8.9 - 10.3 mg/dL  8.3(L) 8.5(L) 8.8(L)  Total Protein 6.5 - 8.1 g/dL - - -  Total Bilirubin 0.3 - 1.2 mg/dL - - -  Alkaline Phos 38 - 126 U/L - - -  AST 15 - 41 U/L - - -  ALT 0 - 44 U/L - - -    CBG (last 3)  Recent Labs    06/21/21 1649 06/21/21 2148 06/22/21 0832  GLUCAP 137* 116* 93   ABG    Component Value Date/Time   PHART 7.304 (L) 06/14/2021 1437   PCO2ART 91.0 (HH) 06/14/2021 1437   PO2ART 146 (H) 06/14/2021 1437   HCO3 43.3 (H) 06/19/2021 1505   TCO2 45 (H) 06/19/2021 1505   O2SAT 70.0 06/19/2021 1505   Signature:  Chesley Mires, MD [place] Pager - [phone] 06/22/2021, 9:24 AM

## 2021-06-22 NOTE — Progress Notes (Signed)
PROGRESS NOTE    Rebecca [name]  PTW:[id number] DOB: 1940-10-06 DOA: 06/19/2021 PCP: Farris Has, MD   Brief Narrative/Hospital Course: Rebecca [name] Alert, 80 y.o. 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  Was not prednisone since beginning of November that was increased to 40 mg from 10 during last admission and is scheduled to return to 10 mg from 12/7 In the ED on 5 L nasal cannula CT angio negative for PE but with bilateral lower field infiltrates suspicious for infection hemoglobin 11.5 g stable, PCCM was consulted and admitted  Subjective: Seen and examined this morning.  Daughter at the bedside.  Patient does not member much but daughter reports she has had multiple episodes of coughing and his time with blood Overnight no fever, Hemoglobin is stable, on home oxygen 5 L Potassium low  Assessment & Plan:  Hemoptysis: New onset, with ongoing frequent episodes. Unclear etiology-could be from infection- being managed with IV antibiotics,Flonase, continue antitussives cough suppressants.  Pulmonary following closely -they are deferring bronchoscopy at [facility] hiatus.  No significant drop in hemoglobin.  Repeat chest x-ray 12/10 per pulmonary  Community-acquired bacterial pneumonia:CT angio no PE, but with B/L lower field infiltrates. Continue ceftriaxone, doxycycline, bronchodilators, pulmonary toileting  Acute on chronic hypoxic and hypercapnic respiratory failure on 5 L nasal cannula Severe COPD/emphysema with recent exacerbation and was placed on prednisone as  OP: Vbg-chronic status compensated hypercapnia.  Continue with home bronchodilators, Pulmicort neb, IV antibiotics as above.monitor respiratory status closely.  She is on a steroid taper from her last discharge.  Chronic rhinitis:added Flonase 1 spray daily, nasal irrigation twice daily if persistent can add azelastine monitor.  Chronic systolic CHF  volume status is stable-continue with home Lasix 20 mg daily.he had recent left and right heart cath  in past week.Monitor daily weight intake output. 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   Mild hyponatremia mild.  Monitor  Hypertension fairly controlled on home losartan.  HLD: Continue statins  Physical deconditioning: PT OT input appreciated will need a skilled nursing facility patient agreeable, TOC consulted  DVT prophylaxis: SCDs Start: 06/19/21 1711 chemical prophylaxis on hold due to hemoptysis Code Status:   Code Status: Full Code Family Communication: plan of care discussed with patient and the daughter at bedside. Status is: Inpatient Remains inpatient appropriate because: For ongoing management of hematemesis pneumonia Disposition: Currently not medically stable for discharge. Anticipated Disposition: SNF based on PT OT recommendation  Objective: Vitals last 24 hrs: Vitals:   06/22/21 0749 06/22/21 0752 06/22/21 0753 06/22/21 0835  BP:    (!) 126/58  Pulse:    87  Resp:    18  Temp:    98.3 F (36.8 C)  TempSrc:      SpO2: 90% 90% 90% 93%  Weight:  Height:       Weight change:   Intake/Output Summary (Last 24 hours) at 06/22/2021 1113 Last data filed at 06/21/2021  1634 Gross per 24 hour  Intake 250 ml  Output 1200 ml  Net -950 ml   Net IO Since Admission: -888.09 mL [06/22/21 1113]   Physical Examination: General exam: AAOx 3, elderly frail  HEENT:Oral mucosa moist, Ear/Nose WNL grossly, dentition normal. Respiratory system: bilaterally diminished, minimal wheezing, no use of accessory muscle Cardiovascular system: S1 & S2 +, No JVD,. Gastrointestinal system: Abdomen soft,NT,ND, BS+ Nervous System:Alert, awake, moving extremities and grossly nonfocal Extremities: no edema, distal peripheral pulses palpable.  Skin: No rashes,no icterus. MSK: Normal muscle bulk,tone, power   Medications reviewed:  Scheduled Meds:  arformoterol  15 mcg Nebulization BID   benzonatate  200 mg Oral BID   brimonidine  1 drop Left Eye BID   budesonide  0.5 mg Nebulization BID   docusate sodium  100 mg Oral Daily   estradiol  1 Applicatorful Vaginal QODAY   ezetimibe  10 mg Oral Daily   furosemide  20 mg Oral Daily   guaiFENesin  1,200 mg Oral BID   insulin aspart  0-15 Units Subcutaneous TID WC   losartan  50 mg Oral Daily   pantoprazole  40 mg Oral Q1200   polyethylene glycol  17 g Oral Daily   potassium chloride  20 mEq Oral Daily   [START ON 06/23/2021] predniSONE  5 mg Oral Q breakfast   revefenacin  175 mcg Nebulization Daily   sodium chloride  2 spray Each Nare BID   Continuous Infusions:  cefTRIAXone (ROCEPHIN)  IV 1 g (06/22/21 0018)   doxycycline (VIBRAMYCIN) IV 100 mg (06/22/21 0945)   Diet Order             Diet heart healthy/carb modified Room service appropriate? Yes; Fluid consistency: Thin  Diet effective now                 Weight change:   Wt Readings from Last 3 Encounters:  06/20/21 72 kg  06/19/21 71.2 kg  06/14/21 71.1 kg     Consultants:see note  Procedures:see note Antimicrobials: Anti-infectives (From admission, onward)    Start     Dose/Rate Route Frequency Ordered Stop   06/20/21 1800  vancomycin (VANCOREADY)  IVPB 750 mg/150 mL  Status:  Discontinued        750 mg 150 mL/hr over 60 Minutes Intravenous Every 24 hours 06/19/21 1659 06/19/21 1714   06/20/21 1030  doxycycline (VIBRAMYCIN) 100 mg in sodium chloride 0.9 % 250 mL IVPB        100 mg 125 mL/hr over 120 Minutes Intravenous 2 times daily 06/20/21 1026 06/25/21 0959   06/20/21 1015  doxycycline (VIBRAMYCIN) 100 mg in sodium chloride 0.9 % 250 mL IVPB  Status:  Discontinued        100 mg 125 mL/hr over 120 Minutes Intravenous Every 12 hours 06/20/21 1013 06/20/21 1026   06/20/21 1000  azithromycin (ZITHROMAX) 500 mg in sodium chloride 0.9 % 250 mL IVPB  Status:  Discontinued        500 mg 250 mL/hr over 60 Minutes Intravenous Every 24 hours 06/20/21 0959 06/20/21 1013   06/20/21 0600  ceFEPIme (MAXIPIME) 2 g in sodium chloride 0.9 % 100 mL IVPB  Status:  Discontinued        2 g 200 mL/hr over 30 Minutes Intravenous Every 12 hours 06/19/21 1659 06/19/21 1714  06/20/21 0000  cefTRIAXone (ROCEPHIN) 1 g in sodium chloride 0.9 % 100 mL IVPB        1 g 200 mL/hr over 30 Minutes Intravenous Every 24 hours 06/19/21 1714     06/19/21 1630  vancomycin (VANCOREADY) IVPB 1000 mg/200 mL  Status:  Discontinued        1,000 mg 200 mL/hr over 60 Minutes Intravenous  Once 06/19/21 1623 06/19/21 1627   06/19/21 1630  vancomycin (VANCOREADY) IVPB 1500 mg/300 mL  Status:  Discontinued        1,500 mg 150 mL/hr over 120 Minutes Intravenous  Once 06/19/21 1627 06/19/21 1714   06/19/21 1630  ceFEPIme (MAXIPIME) 2 g in sodium chloride 0.9 % 100 mL IVPB  Status:  Discontinued        2 g 200 mL/hr over 30 Minutes Intravenous  Once 06/19/21 1627 06/19/21 1751      Culture/Microbiology    Component Value Date/Time   SDES BLOOD LEFT HAND 06/19/2021 1704   SPECREQUEST  06/19/2021 1704    BOTTLES DRAWN AEROBIC AND ANAEROBIC Blood Culture results may not be optimal due to an inadequate volume of blood received in culture bottles   CULT  06/19/2021 1704    NO  GROWTH 3 DAYS Performed at [facility], [address], [place], [place] [address]    REPTSTATUS PENDING 06/19/2021 1704    Other culture-see note  Unresulted Labs (From admission, onward)     Start     Ordered   06/21/21 0500  Basic metabolic panel  Daily,   R     Question:  Specimen collection method  Answer:  Lab=Lab collect   06/20/21 0959   06/21/21 0500  CBC  Daily,   R     Question:  Specimen collection method  Answer:  Lab=Lab collect   06/20/21 0959          Data Reviewed: I have personally reviewed following labs and imaging studies CBC: Recent Labs  Lab 06/19/21 1415 06/19/21 1505 06/20/21 0615 06/21/21 0410 06/22/21 0121  WBC 11.4*  --  11.1* 9.8 9.2  NEUTROABS 10.3*  --   --   --   --   HGB 11.5* 12.2 10.6* 10.4* 10.8*  HCT 35.1* 36.0 32.7* 32.7* 33.2*  MCV 98.0  --  97.6 100.0 100.3*  PLT 278  --  259 263 242   Basic Metabolic Panel: Recent Labs  Lab 06/19/21 1415 06/19/21 1419 06/19/21 1505 06/20/21 0615 06/21/21 0410 06/22/21 0121  NA 130*  --  130* 132* 134* 135  K 3.5  --  3.6 3.4* 4.2 3.2*  CL 83*  --   --  83* 88* 91*  CO2 35*  --   --  38* 38* 34*  GLUCOSE 165*  --   --  105* 99 98  BUN 16  --   --  15 14 17   CREATININE 0.73  --   --  0.79 0.70 0.66  CALCIUM 9.7  --   --  8.8* 8.5* 8.3*  MG  --  1.8  --   --   --   --    GFR: Estimated Creatinine Clearance: 52.2 mL/min (by C-G formula based on SCr of 0.66 mg/dL). Liver Function Tests: Recent Labs  Lab 06/19/21 1415  AST 34  ALT 30  ALKPHOS 50  BILITOT 0.9  PROT 6.7  ALBUMIN 3.8   No results for input(s): LIPASE, AMYLASE in the last 168 hours. No results  for input(s): AMMONIA in the last 168 hours. Coagulation Profile: Recent Labs  Lab 06/20/21 1405  INR 1.0   Cardiac Enzymes: No results for input(s): CKTOTAL, CKMB, CKMBINDEX, TROPONINI in the last 168 hours. BNP (last 3 results) Recent Labs    04/10/21 1518  PROBNP 179   HbA1C: No results for input(s):  HGBA1C in the last 72 hours. CBG: Recent Labs  Lab 06/21/21 0745 06/21/21 1152 06/21/21 1649 06/21/21 2148 06/22/21 0832  GLUCAP 114* 117* 137* 116* 93   Lipid Profile: No results for input(s): CHOL, HDL, LDLCALC, TRIG, CHOLHDL, LDLDIRECT in the last 72 hours. Thyroid Function Tests: No results for input(s): TSH, T4TOTAL, FREET4, T3FREE, THYROIDAB in the last 72 hours. Anemia Panel: No results for input(s): VITAMINB12, FOLATE, FERRITIN, TIBC, IRON, RETICCTPCT in the last 72 hours. Sepsis Labs: Recent Labs  Lab 06/19/21 1640  LATICACIDVEN 0.9    Recent Results (from the past 240 hour(s))  Resp Panel by RT-PCR (Flu A&B, Covid) Nasopharyngeal Swab     Status: None   Collection Time: 06/14/21  5:36 PM   Specimen: Nasopharyngeal Swab; Nasopharyngeal(NP) swabs in vial transport medium  Result Value Ref Range Status   SARS Coronavirus 2 by RT PCR NEGATIVE NEGATIVE Final    Comment: (NOTE) SARS-CoV-2 target nucleic acids are NOT DETECTED.  The SARS-CoV-2 RNA is generally detectable in upper respiratory specimens during the acute phase of infection. The lowest concentration of SARS-CoV-2 viral copies this assay can detect is 138 copies/mL. A negative result does not preclude SARS-Cov-2 infection and should not be used as the sole basis for treatment or other patient management decisions. A negative result may occur with  improper specimen collection/handling, submission of specimen other than nasopharyngeal swab, presence of viral mutation(s) within the areas targeted by this assay, and inadequate number of viral copies(<138 copies/mL). A negative result must be combined with clinical observations, patient history, and epidemiological information. The expected result is Negative.  Fact Sheet for Patients:  BloggerCourse.com  Fact Sheet for Healthcare Providers:  SeriousBroker.it  This test is no t yet approved or cleared by  the [place] FDA and  has been authorized for detection and/or diagnosis of SARS-CoV-2 by FDA under an Emergency Use Authorization (EUA). This EUA will remain  in effect (meaning this test can be used) for the duration of the COVID-19 declaration under Section 564(b)(1) of the Act, 21 U.S.C.section 360bbb-3(b)(1), unless the authorization is terminated  or revoked sooner.       Influenza A by PCR NEGATIVE NEGATIVE Final   Influenza B by PCR NEGATIVE NEGATIVE Final    Comment: (NOTE) The Xpert Xpress SARS-CoV-2/FLU/RSV plus assay is intended as an aid in the diagnosis of influenza from Nasopharyngeal swab specimens and should not be used as a sole basis for treatment. Nasal washings and aspirates are unacceptable for Xpert Xpress SARS-CoV-2/FLU/RSV testing.  Fact Sheet for Patients: BloggerCourse.com  Fact Sheet for Healthcare Providers: SeriousBroker.it  This test is not yet approved or cleared by the [place] FDA and has been authorized for detection and/or diagnosis of SARS-CoV-2 by FDA under an Emergency Use Authorization (EUA). This EUA will remain in effect (meaning this test can be used) for the duration of the COVID-19 declaration under Section 564(b)(1) of the Act, 21 U.S.C. section 360bbb-3(b)(1), unless the authorization is terminated or revoked.  Performed at [facility], [address], [place], [place] [address]   MRSA Next Gen by PCR, Nasal     Status: None  Collection Time: 06/14/21  5:36 PM   Specimen: Nasopharyngeal Swab; Nasal Swab  Result Value Ref Range Status   MRSA by PCR Next Gen NOT DETECTED NOT DETECTED Final    Comment: (NOTE) The GeneXpert MRSA Assay (FDA approved for NASAL specimens only), is one component of a comprehensive MRSA colonization surveillance program. It is not intended to diagnose MRSA infection nor to guide or monitor treatment for MRSA infections. Test  performance is not FDA approved in patients less than 34 years old. Performed at [facility], [address] [place], [place] [address]   Resp Panel by RT-PCR (Flu A&B, Covid) Nasopharyngeal Swab     Status: None   Collection Time: 06/19/21  4:29 PM   Specimen: Nasopharyngeal Swab; Nasopharyngeal(NP) swabs in vial transport medium  Result Value Ref Range Status   SARS Coronavirus 2 by RT PCR NEGATIVE NEGATIVE Final    Comment: (NOTE) SARS-CoV-2 target nucleic acids are NOT DETECTED.  The SARS-CoV-2 RNA is generally detectable in upper respiratory specimens during the acute phase of infection. The lowest concentration of SARS-CoV-2 viral copies this assay can detect is 138 copies/mL. A negative result does not preclude SARS-Cov-2 infection and should not be used as the sole basis for treatment or other patient management decisions. A negative result may occur with  improper specimen collection/handling, submission of specimen other than nasopharyngeal swab, presence of viral mutation(s) within the areas targeted by this assay, and inadequate number of viral copies(<138 copies/mL). A negative result must be combined with clinical observations, patient history, and epidemiological information. The expected result is Negative.  Fact Sheet for Patients:  BloggerCourse.com  Fact Sheet for Healthcare Providers:  SeriousBroker.it  This test is no t yet approved or cleared by the [place] FDA and  has been authorized for detection and/or diagnosis of SARS-CoV-2 by FDA under an Emergency Use Authorization (EUA). This EUA will remain  in effect (meaning this test can be used) for the duration of the COVID-19 declaration under Section 564(b)(1) of the Act, 21 U.S.C.section 360bbb-3(b)(1), unless the authorization is terminated  or revoked sooner.       Influenza A by PCR NEGATIVE NEGATIVE Final   Influenza B by PCR NEGATIVE  NEGATIVE Final    Comment: (NOTE) The Xpert Xpress SARS-CoV-2/FLU/RSV plus assay is intended as an aid in the diagnosis of influenza from Nasopharyngeal swab specimens and should not be used as a sole basis for treatment. Nasal washings and aspirates are unacceptable for Xpert Xpress SARS-CoV-2/FLU/RSV testing.  Fact Sheet for Patients: BloggerCourse.com  Fact Sheet for Healthcare Providers: SeriousBroker.it  This test is not yet approved or cleared by the [place] FDA and has been authorized for detection and/or diagnosis of SARS-CoV-2 by FDA under an Emergency Use Authorization (EUA). This EUA will remain in effect (meaning this test can be used) for the duration of the COVID-19 declaration under Section 564(b)(1) of the Act, 21 U.S.C. section 360bbb-3(b)(1), unless the authorization is terminated or revoked.  Performed at [facility], [address], [place], [place] [address]   Blood culture (routine x 2)     Status: None (Preliminary result)   Collection Time: 06/19/21  4:40 PM   Specimen: BLOOD  Result Value Ref Range Status   Specimen Description BLOOD LEFT ANTECUBITAL  Final   Special Requests   Final    BOTTLES DRAWN AEROBIC AND ANAEROBIC Blood Culture adequate volume   Culture   Final    NO GROWTH 3 DAYS  Performed at [facility], [address] [place], [place] [address]    Report Status PENDING  Incomplete  Blood culture (routine x 2)     Status: None (Preliminary result)   Collection Time: 06/19/21  5:04 PM   Specimen: BLOOD LEFT HAND  Result Value Ref Range Status   Specimen Description BLOOD LEFT HAND  Final   Special Requests   Final    BOTTLES DRAWN AEROBIC AND ANAEROBIC Blood Culture results may not be optimal due to an inadequate volume of blood received in culture bottles   Culture   Final    NO GROWTH 3 DAYS Performed at [facility], [address] [place], [place]  [address]    Report Status PENDING  Incomplete     Radiology Studies: No results found.   LOS: 3 days   Lanae Boast, MD [facility]  06/22/2021, 11:13 AM

## 2021-06-22 NOTE — Plan of Care (Signed)

## 2021-06-22 NOTE — Evaluation (Signed)
Occupational Therapy Evaluation Patient Details Name: Rebecca [name] MRN: [id number] DOB: Oct 20, 1940 Today's Date: 06/22/2021   History of Present Illness 80 yo female presents to [facility] on 12/6 for hemoptysis since heart cath (procedure terminated due to third degree heart block, temporary pacemaker needed) 12/1. Workup for respiratory failure suspect due to COPD and PNA. 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   Clinical Impression   Pt lives in ILF, reports being independent at baseline with ADLs and functional mobility, was recently considering hiring caregiver for IADLs prior to hospital admission. Pt set up -min A for ADLs during session min guard for transfers. Pt remained on 5LO2 throughout session, reports SOB episode after chair transfer, SpO2 97%. Pt in a lot of pain this session in LUE IV site, also presents with activity tolerance, balance, and strength impairments at this time. Will continue to follow acutely. Recommend SNF at d/c.     Recommendations for follow up therapy are one component of a multi-disciplinary discharge planning process, led by the attending physician.  Recommendations may be updated based on patient status, additional functional criteria and insurance authorization.   Follow Up Recommendations  Skilled nursing-short term rehab (<3 hours/day)    Assistance Recommended at Discharge Intermittent Supervision/Assistance  Functional Status Assessment  Patient has had a recent decline in their functional status and demonstrates the ability to make significant improvements in function in a reasonable and predictable amount of time.  Equipment Recommendations  None recommended by OT;Other (comment) (TBD at next venue of care)    Recommendations for Other Services PT consult     Precautions / Restrictions  Precautions Precautions: Fall Restrictions Weight Bearing Restrictions: No      Mobility Bed Mobility Overal bed mobility: Needs Assistance             General bed mobility comments: sitting EOB    Transfers Overall transfer level: Needs assistance Equipment used: Rolling walker (2 wheels) Transfers: Sit to/from Stand;Bed to chair/wheelchair/BSC Sit to Stand: Min guard           General transfer comment: requires increased time, cuing for hand placement      Balance Overall balance assessment: Needs assistance;History of Falls Sitting-balance support: No upper extremity supported;Feet supported Sitting balance-Leahy Scale: Good     Standing balance support: Bilateral upper extremity supported;During functional activity;Reliant on assistive device for balance Standing balance-Leahy Scale: Poor                             ADL either performed or assessed with clinical judgement   ADL Overall ADL's : Needs assistance/impaired Eating/Feeding: Set up;Sitting   Grooming: Set up;Sitting   Upper Body Bathing: Minimal assistance;Sitting   Lower Body Bathing: Minimal assistance;Sitting/lateral leans   Upper Body Dressing : Sitting;Set up   Lower Body Dressing: Minimal assistance;Moderate assistance;Sit to/from stand   Toilet Transfer: Minimal assistance;BSC/3in1;Rolling walker (2 wheels) Toilet Transfer Details (indicate cue type and reason): simulated BSC trasnfer to chair Toileting- Clothing Manipulation and Hygiene: Sitting/lateral lean;Supervision/safety Toileting - Clothing Manipulation Details (indicate cue type and reason): performed simulated pericare in chair     Functional mobility during ADLs: Min guard;Minimal assistance General ADL Comments: Pt on 5LO2 throughout session     Vision Baseline Vision/History: 1 Wears glasses Vision Assessment?: No apparent visual deficits     Perception  Praxis      Pertinent Vitals/Pain Pain  Assessment: Faces Pain Score: 10-Worst pain ever Faces Pain Scale: Hurts worst Pain Location: LUE Pain Descriptors / Indicators: Aching;Constant;Discomfort;Grimacing;Guarding Pain Intervention(s): Limited activity within patient's tolerance;Monitored during session;Premedicated before session;Repositioned;Ice applied     Hand Dominance Right   Extremity/Trunk Assessment Upper Extremity Assessment Upper Extremity Assessment: Generalized weakness   Lower Extremity Assessment Lower Extremity Assessment: Generalized weakness   Cervical / Trunk Assessment Cervical / Trunk Assessment: Kyphotic   Communication Communication Communication: No difficulties   Cognition Arousal/Alertness: Awake/alert Behavior During Therapy: WFL for tasks assessed/performed Overall Cognitive Status: Within Functional Limits for tasks assessed                                       General Comments       Exercises     Shoulder Instructions      Home Living Family/patient expects to be discharged to:: Other (Comment) (ILF [facility]) Living Arrangements: Alone Available Help at Discharge: Available PRN/intermittently;Home health;Personal care attendant Type of Home: Independent living facility Home Access: Level entry     Home Layout: One level     Bathroom Shower/Tub: Producer, television/film/video: Handicapped height     Home Equipment: Rollator (4 wheels);Shower seat;Grab bars - toilet;Grab bars - tub/shower   Additional Comments: Pt was independent in ILF, reporting consulting with hiring a caregiver for IADLs 1 week prior to hospital admission      Prior Functioning/Environment Prior Level of Function : Independent/Modified Independent             Mobility Comments: pt reports walking with rollator, also has wheelchair for further distances. Pt takes self to and from dining hall ADLs Comments: Not currently receiving assist, states she is working on  caregiver assist provided through her facility        OT Problem List: Decreased strength;Decreased range of motion;Decreased activity tolerance;Impaired balance (sitting and/or standing);Pain      OT Treatment/Interventions: Self-care/ADL training;Therapeutic exercise;DME and/or AE instruction;Therapeutic activities;Balance training;Patient/family education    OT Goals(Current goals can be found in the care plan section) Acute Rehab OT Goals Patient Stated Goal: decrease pain OT Goal Formulation: With patient Time For Goal Achievement: 07/06/21 Potential to Achieve Goals: Good ADL Goals Pt Will Perform Upper Body Dressing: with supervision;sitting Pt Will Perform Lower Body Dressing: with supervision;sit to/from stand Pt Will Transfer to Toilet: with supervision;ambulating;regular height toilet  OT Frequency: Min 2X/week   Barriers to D/C:            Co-evaluation              AM-PAC OT "6 Clicks" Daily Activity     Outcome Measure Help from another person eating meals?: None Help from another person taking care of personal grooming?: A Little Help from another person toileting, which includes using toliet, bedpan, or urinal?: A Little Help from another person bathing (including washing, rinsing, drying)?: A Lot Help from another person to put on and taking off regular upper body clothing?: A Little Help from another person to put on and taking off regular lower body clothing?: A Lot 6 Click Score: 17   End of Session Equipment Utilized During Treatment: Gait belt;Rolling walker (2 wheels);Oxygen Nurse Communication: Mobility status;Other (comment) (need for chair alarm)  Activity Tolerance: Patient limited by fatigue;Patient limited by pain Patient left: in bed;with call bell/phone within reach;with  nursing/sitter in room;Other (comment) (pt does not need chair alarm per RN)  OT Visit Diagnosis: Unsteadiness on feet (R26.81);Repeated falls (R29.6);Other  abnormalities of gait and mobility (R26.89);Muscle weakness (generalized) (M62.81)                Time: 6286-3817 OT Time Calculation (min): 19 min Charges:  OT General Charges $OT Visit: 1 Visit OT Evaluation $OT Eval Low Complexity: 1 Low  Alfonzo Beers, OTD, OTR/L Acute Rehab [phone]   Mayer Masker 06/22/2021, 11:42 AM

## 2021-06-22 NOTE — TOC Initial Note (Signed)
Transition of Care [facility]) - Initial/Assessment Note    Patient Details  Name: Rebecca [name] MRN: [id number] Date of Birth: 1941-04-17  Transition of Care [facility]) CM/SW Contact:    Joanne Chars, LCSW Phone Number: 06/22/2021, 11:32 AM  Clinical Narrative:   CSW met with pt and daughter [name] to discuss PT recommendation for SNF.  Permission given to speak with [name] present, also to speak with other daughter Jan.  Pt agreeable to SNF, choice document given, permission given to send out referral in the hub.  Pt independent living resident at [facility], reports currently receiving PT/OT services there through [facility].  Pt is vaccinated for covid with 2 boosters.                  Expected Discharge Plan: Skilled Nursing Facility Barriers to Discharge: Continued Medical Work up, SNF Pending bed offer   Patient Goals and CMS Choice Patient states their goals for this hospitalization and ongoing recovery are:: get well CMS Medicare.gov Compare Post Acute Care list provided to:: Patient Represenative (must comment) Choice offered to / list presented to : Adult Children  Expected Discharge Plan and Services Expected Discharge Plan: [place] Acute Care Choice: [place] Living arrangements for the past 2 months: [place] ([employer], [place], [place])                                      Prior Living Arrangements/Services Living arrangements for the past 2 months: [place] ([employer], [place], [place]) Lives with:: Facility Resident Patient language and need for interpreter reviewed:: Yes Do you feel safe going back to the place where you live?: Yes      Need for Family Participation in Patient Care: Yes (Comment) Care giver support system in place?: Yes (comment)   Criminal Activity/Legal Involvement Pertinent to Current Situation/Hospitalization: No -  Comment as needed  Activities of Daily Living Home Assistive Devices/Equipment: Eyeglasses, Oxygen ADL Screening (condition at time of admission) Patient's cognitive ability adequate to safely complete daily activities?: Yes Is the patient deaf or have difficulty hearing?: No Does the patient have difficulty seeing, even when wearing glasses/contacts?: No Does the patient have difficulty concentrating, remembering, or making decisions?: No Patient able to express need for assistance with ADLs?: Yes Does the patient have difficulty dressing or bathing?: No Independently performs ADLs?: Yes (appropriate for developmental age) Does the patient have difficulty walking or climbing stairs?: No Weakness of Legs: None Weakness of Arms/Hands: None  Permission Sought/Granted Permission sought to share information with : Family Supports Permission granted to share information with : Yes, Verbal Permission Granted  Share Information with NAME: both daughters: [name] , Jan  Permission granted to share info w AGENCY: SNF        Emotional Assessment Appearance:: Appears stated age Attitude/Demeanor/Rapport: Engaged Affect (typically observed): Appropriate Orientation: : Oriented to Self, Oriented to Place, Oriented to  Time, Oriented to Situation Alcohol / Substance Use: Not Applicable Psych Involvement: No (comment)  Admission diagnosis:  HCAP (healthcare-associated pneumonia) [J18.9] Hemoptysis [R04.2] Patient Active Problem List   Diagnosis Date Noted   Hemoptysis 06/19/2021   Hematochezia 06/19/2021   HCAP (healthcare-associated pneumonia)    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) 06/18/2021   CAD (coronary artery disease) 06/18/2021   Essential hypertension 06/18/2021   Hyperlipidemia 06/18/2021  Left bundle branch block 06/18/2021   Dyspnea on minimal exertion 06/14/2021   Heart block AV complete ([place]) 06/14/2021   Physical deconditioning 08/03/2020   Chronic obstructive  pulmonary disease with acute exacerbation [facility])    Palliative care encounter    Goals of care, counseling/discussion    Acute respiratory failure with hypoxia ([place]) 01/13/2020   COPD exacerbation ([place]) 01/12/2020   Nonischemic cardiomyopathy ([place]) 07/02/2018   Lung nodule 06/18/2018   Chronic respiratory failure with hypoxia ([place]) 02/05/2018   COPD with chronic bronchitis and emphysema ([place]) 02/05/2018   PCP:  London Pepper, MD Pharmacy:   [id number] [place], [place]Phone: [phone]: [phone] [facility] - [place]Phone: [phone]: [phone] [place] [address] [address]Phone: [phone]: [phone] [place]Phone: [phone]: [phone]    Social Determinants of Health (SDOH) Interventions    Readmission Risk Interventions No flowsheet data found.

## 2021-06-22 NOTE — NC FL2 (Signed)
[place] MEDICAID FL2 LEVEL OF CARE SCREENING TOOL     IDENTIFICATION  Patient Name: Rebecca [name] Birthdate: August 17, 1940 Sex: female Admission Date (Current Location): 06/19/2021  [facility] and [place] Number:  Producer, television/film/video and Address:  [facility], 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) 06/18/2021   CAD (coronary artery disease) 06/18/2021   Essential hypertension 06/18/2021   Hyperlipidemia 06/18/2021   Left bundle branch block 06/18/2021   Dyspnea on minimal exertion 06/14/2021   Heart block AV complete (HCC) 06/14/2021   Physical deconditioning 08/03/2020   Chronic obstructive pulmonary disease with acute exacerbation [facility])    Palliative care encounter    Goals of care, counseling/discussion    Acute respiratory failure with hypoxia (HCC) 01/13/2020   COPD exacerbation (H[facility]) 01/12/2020   Nonischemic cardiomyopathy (H[facility]) 07/02/2018   Lung nodule 06/18/2018   Chronic respiratory failure with hypoxia (H[facility]) 02/05/2018   COPD with chronic bronchitis and emphysema (H[facility]) 02/05/2018    Orientation RESPIRATION BLADDER Height & Weight     Self, Time, Situation, Place  O2 Continent Weight: 158 lb 11.7 oz (72 kg) Height:  5\' 2"  (157.5 cm)  BEHAVIORAL SYMPTOMS/MOOD NEUROLOGICAL BOWEL NUTRITION STATUS      Continent Diet (see discharge summary)  AMBULATORY STATUS COMMUNICATION OF  NEEDS Skin   Limited Assist Verbally Other (Comment) (ecchymosis)                       Personal Care Assistance Level of Assistance  Bathing, Feeding, Dressing Bathing Assistance: Limited assistance Feeding assistance: Limited assistance Dressing Assistance: Limited assistance     Functional Limitations Info  Sight, Hearing, Speech Sight Info: Adequate Hearing Info: Adequate Speech Info: Adequate    SPECIAL CARE FACTORS FREQUENCY  PT (By licensed PT), OT (By licensed OT)     PT Frequency: 5x week OT Frequency: 5x week            Contractures Contractures Info: Not present    Additional Factors Info  Code Status, Allergies, Insulin Sliding Scale Code Status Info: full Allergies Info: Gabapentin, Pravastatin Sodium, Rosuvastatin Calcium, Simvastatin, Amoxicillin, Lisinopril, Sulfacetamide, Tetracyclines & Related   Insulin Sliding Scale Info: Novolog, 0-15 units 3x day with meals, see discharge summary       Current Medications (06/22/2021):  This is the current hospital active medication list Current Facility-Administered Medications  Medication Dose Route Frequency Provider Last Rate Last Admin   acetaminophen (TYLENOL) tablet 1,000 mg  1,000 mg Oral BID PRN 14/03/2021 T, MD       albuterol (PROVENTIL) (2.5 MG/3ML) 0.083% nebulizer solution 2.5 mg  2.5 mg Nebulization Q4H PRN Mikey College, MD   2.5 mg at 06/22/21 0743   arformoterol (BROVANA) nebulizer solution 15 mcg  15 mcg Nebulization BID 14/09/22 T, MD   15 mcg at 06/22/21 0749   benzonatate (TESSALON) capsule 200 mg  200 mg Oral  BID Mikey College T, MD   200 mg at 06/22/21 0940   bisacodyl (DULCOLAX) suppository 10 mg  10 mg Rectal Daily PRN Lanae Boast, MD   10 mg at 06/22/21 0947   brimonidine (ALPHAGAN) 0.2 % ophthalmic solution 1 drop  1 drop Left Eye BID Mikey College T, MD   1 drop at 06/22/21 0942   budesonide (PULMICORT) nebulizer solution 0.5 mg  0.5 mg Nebulization BID Mikey College T, MD   0.5 mg at  06/22/21 0750   cefTRIAXone (ROCEPHIN) 1 g in sodium chloride 0.9 % 100 mL IVPB  1 g Intravenous Q24H Mikey College T, MD 200 mL/hr at 06/22/21 0018 1 g at 06/22/21 0018   docusate sodium (COLACE) capsule 100 mg  100 mg Oral Daily Mikey College T, MD   100 mg at 06/22/21 0941   doxycycline (VIBRAMYCIN) 100 mg in sodium chloride 0.9 % 250 mL IVPB  100 mg Intravenous BID Lanae Boast, MD 125 mL/hr at 06/22/21 0945 100 mg at 06/22/21 0945   estradiol (ESTRACE) vaginal cream 1 Applicatorful  1 Applicatorful Vaginal Venda Rodes, MD   1 Applicatorful at 06/21/21 0959   ezetimibe (ZETIA) tablet 10 mg  10 mg Oral Daily Mikey College T, MD   10 mg at 06/22/21 0941   fluticasone (FLONASE) 50 MCG/ACT nasal spray 1 spray  1 spray Each Nare Daily PRN Coralyn Helling, MD   1 spray at 06/21/21 0959   furosemide (LASIX) tablet 20 mg  20 mg Oral Daily Mikey College T, MD   20 mg at 06/22/21 0941   guaiFENesin (MUCINEX) 12 hr tablet 1,200 mg  1,200 mg Oral BID Mikey College T, MD   1,200 mg at 06/22/21 0940   insulin aspart (novoLOG) injection 0-15 Units  0-15 Units Subcutaneous TID WC Emeline General, MD   2 Units at 06/20/21 1621   losartan (COZAAR) tablet 50 mg  50 mg Oral Daily Mikey College T, MD   50 mg at 06/22/21 0940   pantoprazole (PROTONIX) EC tablet 40 mg  40 mg Oral Q1200 Coralyn Helling, MD   40 mg at 06/22/21 0940   polyethylene glycol (MIRALAX / GLYCOLAX) packet 17 g  17 g Oral Daily Kc, Ramesh, MD   17 g at 06/22/21 0272   polyvinyl alcohol (LIQUIFILM TEARS) 1.4 % ophthalmic solution 1 drop  1 drop Both Eyes TID PRN Mikey College T, MD       potassium chloride SA (KLOR-CON M) CR tablet 20 mEq  20 mEq Oral Daily Kc, Ramesh, MD   20 mEq at 06/22/21 0940   [START ON 06/23/2021] predniSONE (DELTASONE) tablet 5 mg  5 mg Oral Q breakfast Coralyn Helling, MD       revefenacin (YUPELRI) nebulizer solution 175 mcg  175 mcg Nebulization Daily Coralyn Helling, MD   175 mcg at 06/22/21 5366   sodium chloride (OCEAN) 0.65 % nasal  spray 2 spray  2 spray Each Nare BID Coralyn Helling, MD   2 spray at 06/22/21 0941     Discharge Medications: Please see discharge summary for a list of discharge medications.  Relevant Imaging Results:  Relevant Lab Results:   Additional Information SSN [id number]. Pt is vaccinated for covid with 2 boosters.  Lorri Frederick, LCSW

## 2021-06-23 ENCOUNTER — Inpatient Hospital Stay (HOSPITAL_COMMUNITY): Payer: Medicare HMO

## 2021-06-23 DIAGNOSIS — J9612 Chronic respiratory failure with hypercapnia: Secondary | ICD-10-CM

## 2021-06-23 DIAGNOSIS — J9611 Chronic respiratory failure with hypoxia: Secondary | ICD-10-CM

## 2021-06-23 DIAGNOSIS — R042 Hemoptysis: Secondary | ICD-10-CM | POA: Diagnosis not present

## 2021-06-23 DIAGNOSIS — J189 Pneumonia, unspecified organism: Secondary | ICD-10-CM | POA: Diagnosis not present

## 2021-06-23 LAB — GLUCOSE, CAPILLARY
Glucose-Capillary: 107 mg/dL — ABNORMAL HIGH (ref 70–99)
Glucose-Capillary: 119 mg/dL — ABNORMAL HIGH (ref 70–99)
Glucose-Capillary: 101 mg/dL — ABNORMAL HIGH (ref 70–99)
Glucose-Capillary: 148 mg/dL — ABNORMAL HIGH (ref 70–99)

## 2021-06-23 LAB — CBC
HCT: 30.6 % — ABNORMAL LOW (ref 36.0–46.0)
HCT: 31.9 % — ABNORMAL LOW (ref 36.0–46.0)
Hemoglobin: 9.6 g/dL — ABNORMAL LOW (ref 12.0–15.0)
Hemoglobin: 9.9 g/dL — ABNORMAL LOW (ref 12.0–15.0)
MCH: 32.2 pg (ref 26.0–34.0)
MCH: 32.2 pg (ref 26.0–34.0)
MCHC: 31.4 g/dL (ref 30.0–36.0)
MCHC: 31 g/dL (ref 30.0–36.0)
MCV: 102.7 fL — ABNORMAL HIGH (ref 80.0–100.0)
MCV: 103.9 fL — ABNORMAL HIGH (ref 80.0–100.0)
Platelets: 237 10*3/uL (ref 150–400)
Platelets: 234 10*3/uL (ref 150–400)
RBC: 2.98 MIL/uL — ABNORMAL LOW (ref 3.87–5.11)
RBC: 3.07 MIL/uL — ABNORMAL LOW (ref 3.87–5.11)
RDW: 13.4 % (ref 11.5–15.5)
RDW: 13.5 % (ref 11.5–15.5)
WBC: 10.5 10*3/uL (ref 4.0–10.5)
WBC: 12 10*3/uL — ABNORMAL HIGH (ref 4.0–10.5)
nRBC: 0 % (ref 0.0–0.2)
nRBC: 0 % (ref 0.0–0.2)

## 2021-06-23 LAB — COMPREHENSIVE METABOLIC PANEL
ALT: 30 U/L (ref 0–44)
AST: 27 U/L (ref 15–41)
Albumin: 3 g/dL — ABNORMAL LOW (ref 3.5–5.0)
Alkaline Phosphatase: 41 U/L (ref 38–126)
Anion gap: 5 (ref 5–15)
BUN: 21 mg/dL (ref 8–23)
CO2: 36 mmol/L — ABNORMAL HIGH (ref 22–32)
Calcium: 8.4 mg/dL — ABNORMAL LOW (ref 8.9–10.3)
Chloride: 93 mmol/L — ABNORMAL LOW (ref 98–111)
Creatinine, Ser: 0.76 mg/dL (ref 0.44–1.00)
GFR, Estimated: >60 mL/min (ref >60)
Glucose, Bld: 160 mg/dL — ABNORMAL HIGH (ref 70–99)
Potassium: 5.4 mmol/L — ABNORMAL HIGH (ref 3.5–5.1)
Sodium: 134 mmol/L — ABNORMAL LOW (ref 135–145)
Total Bilirubin: 0.7 mg/dL (ref 0.3–1.2)
Total Protein: 5.4 g/dL — ABNORMAL LOW (ref 6.5–8.1)

## 2021-06-23 LAB — BASIC METABOLIC PANEL
Anion gap: 7 (ref 5–15)
Anion gap: 10 (ref 5–15)
BUN: 18 mg/dL (ref 8–23)
BUN: 22 mg/dL (ref 8–23)
CO2: 32 mmol/L (ref 22–32)
CO2: 31 mmol/L (ref 22–32)
Calcium: 7.9 mg/dL — ABNORMAL LOW (ref 8.9–10.3)
Calcium: 8.6 mg/dL — ABNORMAL LOW (ref 8.9–10.3)
Chloride: 94 mmol/L — ABNORMAL LOW (ref 98–111)
Chloride: 92 mmol/L — ABNORMAL LOW (ref 98–111)
Creatinine, Ser: 0.72 mg/dL (ref 0.44–1.00)
Creatinine, Ser: 0.68 mg/dL (ref 0.44–1.00)
GFR, Estimated: >60 mL/min (ref >60)
GFR, Estimated: >60 mL/min (ref >60)
Glucose, Bld: 104 mg/dL — ABNORMAL HIGH (ref 70–99)
Glucose, Bld: 125 mg/dL — ABNORMAL HIGH (ref 70–99)
Potassium: 4.2 mmol/L (ref 3.5–5.1)
Potassium: 5 mmol/L (ref 3.5–5.1)
Sodium: 133 mmol/L — ABNORMAL LOW (ref 135–145)
Sodium: 133 mmol/L — ABNORMAL LOW (ref 135–145)

## 2021-06-23 LAB — PROTIME-INR
INR: 0.9 (ref 0.8–1.2)
Prothrombin Time: 12.5 seconds (ref 11.4–15.2)

## 2021-06-23 LAB — APTT: aPTT: 24 seconds (ref 24–36)

## 2021-06-23 LAB — BRAIN NATRIURETIC PEPTIDE: B Natriuretic Peptide: 50.3 pg/mL (ref 0.0–100.0)

## 2021-06-23 LAB — PROCALCITONIN: Procalcitonin: <0.1 ng/mL

## 2021-06-23 MED ORDER — CALCIUM GLUCONATE-NACL 1-0.675 GM/50ML-% IV SOLN
1.0000 g | Freq: Once | INTRAVENOUS | Status: AC
  Administered 2021-06-23: 1000 mg via INTRAVENOUS
  Filled 2021-06-23: qty 50

## 2021-06-23 MED ORDER — IPRATROPIUM-ALBUTEROL 0.5-2.5 (3) MG/3ML IN SOLN
3.0000 mL | Freq: Four times a day (QID) | RESPIRATORY_TRACT | Status: DC
  Administered 2021-06-23 – 2021-06-26 (×12): 3 mL via RESPIRATORY_TRACT
  Filled 2021-06-23 (×12): qty 3

## 2021-06-23 MED ORDER — BENZONATATE 100 MG PO CAPS
200.0000 mg | ORAL_CAPSULE | Freq: Three times a day (TID) | ORAL | Status: DC
  Administered 2021-06-23 – 2021-06-26 (×10): 200 mg via ORAL
  Filled 2021-06-23 (×10): qty 2

## 2021-06-23 MED ORDER — ALBUTEROL SULFATE (2.5 MG/3ML) 0.083% IN NEBU
2.5000 mg | INHALATION_SOLUTION | Freq: Four times a day (QID) | RESPIRATORY_TRACT | Status: DC
  Administered 2021-06-23: 2.5 mg via RESPIRATORY_TRACT
  Filled 2021-06-23: qty 3

## 2021-06-23 MED ORDER — GUAIFENESIN 100 MG/5ML PO LIQD
10.0000 mL | ORAL | Status: DC | PRN
  Administered 2021-06-23: 10 mL via ORAL
  Filled 2021-06-23: qty 10

## 2021-06-23 NOTE — Plan of Care (Signed)
  Problem: Education: Goal: Knowledge of General Education information will improve Description: Including pain rating scale, medication(s)/side effects and non-pharmacologic comfort measures Outcome: Progressing   Problem: Coping: Goal: Level of anxiety will decrease Outcome: Progressing   

## 2021-06-23 NOTE — Progress Notes (Addendum)
PROGRESS NOTE    Rebecca Rebecca [name]  ZOX:[id number] DOB: [birth year] DOA: 06/19/2021 PCP: Farris Has, MD    Brief Narrative:  Rebecca Rebecca [name], 80 y.o. 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  Was not prednisone since beginning of November that was increased to 40 mg from 10 during last admission and is scheduled to return to 10 mg from 12/7 In the ED on 5 L nasal cannula CT angio negative for PE but with bilateral lower field infiltrates suspicious for infection hemoglobin 11.5 g stable, PCCM was consulted and admitted  06/23/2021 Seen in the morning, talk to the daughter on the phone Plan was to be discharged Monday, she will need to choose the rehab, [facility] no hemoptysis this morning Hemoglobin was stable  Just called around 11:00 Patient coughing up blood with clots  Repeat chest x-ray Stable bilateral infiltrates  Assessment & Plan:  Acute on chronic respiratory failure with hemoptysis Patient with history of COPD emphysema 5 L of oxygen Prednisone was changed to 5 mg daily Continue albuterol Pulmicort Brovana   Hemoptysis No chills etiology probably secondary from pneumonia, CT angio negative for PE Pulmonary following bronchoscopy is deferred high risk  Community-acquired pneumonia Day 5 on antibiotic Rocephin and doxycycline Chest x-ray 12/10 stable bilateral infiltrates  Chronic rhinitis Flonase as needed  GERD Protonix   Chronic systolic CHF on Lasix 20 mg daily continue the same, volume status is stable.  She had recent left and  right heart cath  in past week.Monitor daily weight intake output. Net IO Since Admission: 261.91 mL [06/21/21 1009]   Was evaluated by cardiology     I reviewed her case with Dr. Eden [name] (attending MD).  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  Her beta-blocker was stopped.  She is maintaining sinus rhythm.  We will hold off on rechallenge in her with beta-blocker at this time.  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   Mild hyponatremia mild.  Monitor   Hypertension fairly controlled on home losartan.   HLD: Continue statins     Principal Problem:   Hemoptysis Active Problems:   Hematochezia      DVT prophylaxis: (SCD  Code Status: Full code Family Communication: (Discussed with daughter on the phone Disposition Plan: The plan was to be discharged Monday to rehab or skilled nurse Chose the place by family [facility]  But because of new episode of coughing blood this morning Will hold the discharge this weekend   Consultants:  Pulmonary  Procedures:   Antimicrobials: ( Rocephin doxycycline day 5   Subjective: Coughing blood this morning  around 11:00  Objective: Vitals:   06/23/21 0542 06/23/21 0616 06/23/21 0742 06/23/21 1100  BP:  (!) 114/54    Pulse:  88    Resp:  17    Temp:  98.6 F (37 C)    TempSrc:  Oral    SpO2: 100% 100% 98% 100%  Weight:      Height:        Intake/Output Summary (Last 24 hours) at 06/23/2021 1102 Last data filed at 06/23/2021  0507 Gross per 24 hour  Intake 360 ml  Output 600 ml  Net -240 ml   Filed Weights   06/20/21 2136  Weight: 72 kg    Examination:  General exam: Appears calm and comfortable  Respiratory system: Clear to auscultation. Respiratory effort normal. Cardiovascular system: S1 & S2 heard, RRR. No JVD, murmurs, rubs, gallops or clicks. No pedal edema. Gastrointestinal system: Abdomen is nondistended, soft and nontender. No organomegaly or masses felt. Normal bowel sounds heard. Central nervous system: Rebecca [name] and oriented. No focal neurological deficits. Extremities: Symmetric 5 x 5 power. Skin: No rashes, lesions or ulcers Psychiatry: Judgement and insight appear normal. Mood & affect appropriate.     Data Reviewed: I have personally reviewed following labs and imaging studies  CBC: Recent Labs  Lab 06/19/21 1415 06/19/21 1505 06/20/21 0615 06/21/21 0410 06/22/21 0121 06/23/21 0145  WBC 11.4*  --  11.1* 9.8 9.2 10.5  NEUTROABS 10.3*  --   --   --   --   --   HGB 11.5* 12.2 10.6* 10.4* 10.8* 9.6*  HCT 35.1* 36.0 32.7* 32.7* 33.2* 30.6*  MCV 98.0  --  97.6 100.0 100.3* 102.7*  PLT 278  --  259 263 242 237   Basic Metabolic Panel: Recent Labs  Lab 06/19/21 1415 06/19/21 1419 06/19/21 1505 06/20/21 0615 06/21/21 0410 06/22/21 0121 06/23/21 0145  NA 130*  --  130* 132* 134* 135 133*  K 3.5  --  3.6 3.4* 4.2 3.2* 4.2  CL 83*  --   --  83* 88* 91* 94*  CO2 35*  --   --  38* 38* 34* 32  GLUCOSE 165*  --   --  105* 99 98 104*  BUN 16  --   --  15 14 17 18   CREATININE 0.73  --   --  0.79 0.70 0.66 0.72  CALCIUM 9.7  --   --  8.8* 8.5* 8.3* 7.9*  MG  --  1.8  --   --   --   --   --    GFR: Estimated Creatinine Clearance: 52.2 mL/min (by C-G formula based on SCr of 0.72 mg/dL). Liver Function Tests: Recent Labs  Lab 06/19/21 1415  AST 34  ALT 30  ALKPHOS 50  BILITOT 0.9  PROT 6.7  ALBUMIN 3.8   No results for input(s): LIPASE, AMYLASE in the last 168 hours. No  results for input(s): AMMONIA in the last 168 hours. Coagulation Profile: Recent Labs  Lab 06/20/21 1405  INR 1.0   Cardiac Enzymes: No results for input(s): CKTOTAL, CKMB, CKMBINDEX, TROPONINI in the last 168 hours. BNP (last 3 results) Recent Labs    04/10/21 1518  PROBNP 179   HbA1C: No results for input(s): HGBA1C in the last 72 hours. CBG: Recent Labs  Lab 06/22/21 0832 06/22/21 1135 06/22/21 1649 06/22/21 2045 06/23/21 0640  GLUCAP 93 127* 102* 137* 107*   Lipid Profile: No results for input(s): CHOL, HDL, LDLCALC, TRIG, CHOLHDL, LDLDIRECT in  the last 72 hours. Thyroid Function Tests: No results for input(s): TSH, T4TOTAL, FREET4, T3FREE, THYROIDAB in the last 72 hours. Anemia Panel: No results for input(s): VITAMINB12, FOLATE, FERRITIN, TIBC, IRON, RETICCTPCT in the last 72 hours. Sepsis Labs: Recent Labs  Lab 06/19/21 1640  LATICACIDVEN 0.9    Recent Results (from the past 240 hour(s))  Resp Panel by RT-PCR (Flu A&B, Covid) Nasopharyngeal Swab     Status: None   Collection Time: 06/14/21  5:36 PM   Specimen: Nasopharyngeal Swab; Nasopharyngeal(NP) swabs in vial transport medium  Result Value Ref Range Status   SARS Coronavirus 2 by RT PCR NEGATIVE NEGATIVE Final    Comment: (NOTE) SARS-CoV-2 target nucleic acids are NOT DETECTED.  The SARS-CoV-2 RNA is generally detectable in upper respiratory specimens during the acute phase of infection. The lowest concentration of SARS-CoV-2 viral copies this assay can detect is 138 copies/mL. A negative result does not preclude SARS-Cov-2 infection and should not be used as the sole basis for treatment or other patient management decisions. A negative result may occur with  improper specimen collection/handling, submission of specimen other than nasopharyngeal swab, presence of viral mutation(s) within the areas targeted by this assay, and inadequate number of viral copies(<138 copies/mL). A negative result must  be combined with clinical observations, patient history, and epidemiological information. The expected result is Negative.  Fact Sheet for Patients:  BloggerCourse.com  Fact Sheet for Healthcare Providers:  SeriousBroker.it  This test is no t yet approved or cleared by the [place] FDA and  has been authorized for detection and/or diagnosis of SARS-CoV-2 by FDA under an Emergency Use Authorization (EUA). This EUA will remain  in effect (meaning this test can be used) for the duration of the COVID-19 declaration under Section 564(b)(1) of the Act, 21 U.S.C.section 360bbb-3(b)(1), unless the authorization is terminated  or revoked sooner.       Influenza A by PCR NEGATIVE NEGATIVE Final   Influenza B by PCR NEGATIVE NEGATIVE Final    Comment: (NOTE) The Xpert Xpress SARS-CoV-2/FLU/RSV plus assay is intended as an aid in the diagnosis of influenza from Nasopharyngeal swab specimens and should not be used as a sole basis for treatment. Nasal washings and aspirates are unacceptable for Xpert Xpress SARS-CoV-2/FLU/RSV testing.  Fact Sheet for Patients: BloggerCourse.com  Fact Sheet for Healthcare Providers: SeriousBroker.it  This test is not yet approved or cleared by the [place] FDA and has been authorized for detection and/or diagnosis of SARS-CoV-2 by FDA under an Emergency Use Authorization (EUA). This EUA will remain in effect (meaning this test can be used) for the duration of the COVID-19 declaration under Section 564(b)(1) of the Act, 21 U.S.C. section 360bbb-3(b)(1), unless the authorization is terminated or revoked.  Performed at [facility], [address] [place], [place] [address]   MRSA Next Gen by PCR, Nasal     Status: None   Collection Time: 06/14/21  5:36 PM   Specimen: Nasopharyngeal Swab; Nasal Swab  Result Value Ref Range Status   MRSA  by PCR Next Gen NOT DETECTED NOT DETECTED Final    Comment: (NOTE) The GeneXpert MRSA Assay (FDA approved for NASAL specimens only), is one component of a comprehensive MRSA colonization surveillance program. It is not intended to diagnose MRSA infection nor to guide or monitor treatment for MRSA infections. Test performance is not FDA approved in patients less than 92 years old. Performed at [facility], [address] [place], [place] [address]  Resp Panel by RT-PCR (Flu A&B, Covid) Nasopharyngeal Swab     Status: None   Collection Time: 06/19/21  4:29 PM   Specimen: Nasopharyngeal Swab; Nasopharyngeal(NP) swabs in vial transport medium  Result Value Ref Range Status   SARS Coronavirus 2 by RT PCR NEGATIVE NEGATIVE Final    Comment: (NOTE) SARS-CoV-2 target nucleic acids are NOT DETECTED.  The SARS-CoV-2 RNA is generally detectable in upper respiratory specimens during the acute phase of infection. The lowest concentration of SARS-CoV-2 viral copies this assay can detect is 138 copies/mL. A negative result does not preclude SARS-Cov-2 infection and should not be used as the sole basis for treatment or other patient management decisions. A negative result may occur with  improper specimen collection/handling, submission of specimen other than nasopharyngeal swab, presence of viral mutation(s) within the areas targeted by this assay, and inadequate number of viral copies(<138 copies/mL). A negative result must be combined with clinical observations, patient history, and epidemiological information. The expected result is Negative.  Fact Sheet for Patients:  BloggerCourse.com  Fact Sheet for Healthcare Providers:  SeriousBroker.it  This test is no t yet approved or cleared by the [place] FDA and  has been authorized for detection and/or diagnosis of SARS-CoV-2 by FDA under an Emergency Use Authorization (EUA). This  EUA will remain  in effect (meaning this test can be used) for the duration of the COVID-19 declaration under Section 564(b)(1) of the Act, 21 U.S.C.section 360bbb-3(b)(1), unless the authorization is terminated  or revoked sooner.       Influenza A by PCR NEGATIVE NEGATIVE Final   Influenza B by PCR NEGATIVE NEGATIVE Final    Comment: (NOTE) The Xpert Xpress SARS-CoV-2/FLU/RSV plus assay is intended as an aid in the diagnosis of influenza from Nasopharyngeal swab specimens and should not be used as a sole basis for treatment. Nasal washings and aspirates are unacceptable for Xpert Xpress SARS-CoV-2/FLU/RSV testing.  Fact Sheet for Patients: BloggerCourse.com  Fact Sheet for Healthcare Providers: SeriousBroker.it  This test is not yet approved or cleared by the [place] FDA and has been authorized for detection and/or diagnosis of SARS-CoV-2 by FDA under an Emergency Use Authorization (EUA). This EUA will remain in effect (meaning this test can be used) for the duration of the COVID-19 declaration under Section 564(b)(1) of the Act, 21 U.S.C. section 360bbb-3(b)(1), unless the authorization is terminated or revoked.  Performed at [facility], [address] [place], [place] [address]   Blood culture (routine x 2)     Status: None (Preliminary result)   Collection Time: 06/19/21  4:40 PM   Specimen: BLOOD  Result Value Ref Range Status   Specimen Description BLOOD LEFT ANTECUBITAL  Final   Special Requests   Final    BOTTLES DRAWN AEROBIC AND ANAEROBIC Blood Culture adequate volume   Culture   Final    NO GROWTH 4 DAYS Performed at [facility], [address] [place], [place] [address]    Report Status PENDING  Incomplete  Blood culture (routine x 2)     Status: None (Preliminary result)   Collection Time: 06/19/21  5:04 PM   Specimen: BLOOD LEFT HAND  Result Value Ref Range Status   Specimen  Description BLOOD LEFT HAND  Final   Special Requests   Final    BOTTLES DRAWN AEROBIC AND ANAEROBIC Blood Culture results may not be optimal due to an inadequate volume of blood received in culture bottles   Culture  Final    NO GROWTH 4 DAYS Performed at [facility], [address], [place], [place] [address]    Report Status PENDING  Incomplete         Radiology Studies: DG Chest 2 View  Result Date: 06/23/2021 CLINICAL DATA:  Hemoptysis EXAM: CHEST - 2 VIEW COMPARISON:  06/19/2021 FINDINGS: Left lower lobe infiltrate unchanged. Mild right lower lobe airspace disease unchanged Apical emphysema.  No pneumothorax or heart failure. IMPRESSION: Bibasilar airspace disease left greater than right unchanged. Electronically Signed   By: Marlan Palau M.D.   On: 06/23/2021 09:48        Scheduled Meds:  albuterol  2.5 mg Nebulization QID   arformoterol  15 mcg Nebulization BID   benzonatate  200 mg Oral TID   brimonidine  1 drop Left Eye BID   budesonide  0.5 mg Nebulization BID   docusate sodium  100 mg Oral Daily   doxycycline  100 mg Oral Q12H   estradiol  1 Applicatorful Vaginal QODAY   ezetimibe  10 mg Oral Daily   furosemide  20 mg Oral Daily   insulin aspart  0-15 Units Subcutaneous TID WC   losartan  50 mg Oral Daily   pantoprazole  40 mg Oral Q1200   polyethylene glycol  17 g Oral Daily   potassium chloride  20 mEq Oral Daily   predniSONE  5 mg Oral Q breakfast   revefenacin  175 mcg Nebulization Daily   sodium chloride  2 spray Each Nare BID   Continuous Infusions:  cefTRIAXone (ROCEPHIN)  IV 1 g (06/22/21 2320)     LOS: 4 days    Time spent:  35 minutes   Lamaj Metoyer G Elayah Klooster, MD [facility]   If 7PM-7AM, please contact night-coverage [email or web address] Password [email or web address] 06/23/2021, 11:02 AM

## 2021-06-23 NOTE — Progress Notes (Signed)
NAME:  Rebecca [name], MRN:  [id number], DOB:  1940/12/04, LOS: 4 ADMISSION DATE:  06/19/2021, CONSULTATION DATE:  06/19/21 REFERRING MD:  Dr Silverio Lay, CHIEF COMPLAINT:  hemoptysis   History of Present Illness:  80 yo female presented with worsening dyspnea, sinus congestion, and cough with hemoptysis starting 12/02.  Was recently started on prednisone for COPD exacerbation as outpt.  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  She is followed by Dr. Vassie Loll in pulmonary office for severe COPD with emphysema and chronic respiratory failure on 5 liters oxygen.  She had referral placed for palliative care assessment as outpt on 05/24/21.  Pertinent  Medical History  COPD, DM, HLD, Osteoporosis, GERD, Glaucoma, Rhinitis, systolic CHF  Studies:   Spirometry 02/05/18 >> FEV1 0.6 (31%), FEV1% 43 Echo 08/18/20 >> EF 40 to 45%, grade 1 DD CT-PA 06/19/2021 >> severe centrilobular and bullous emphysema, basilar ASD and GGO  Interim History / Subjective:  Reflux and sinus congestion better.  Walked in her room yesterday with PT.  Felt shaky but breathing was okay.  Coughing up dark blood clots.  Not having chest pain or wheeze.  C/o constipation >> says that she eats prunes at home at this helps.  Objective   Blood pressure (!) 114/54, pulse 88, temperature 98.6 F (37 C), temperature source Oral, resp. rate 17, height 5\' 2"  (1.575 m), weight 72 kg, SpO2 100 %.        Intake/Output Summary (Last 24 hours) at 06/23/2021 1338 Last data filed at 06/23/2021 1300 Gross per 24 hour  Intake 360 ml  Output 825 ml  Net -465 ml   Filed Weights   06/20/21 2136  Weight: 72 kg    Examination:  General: Elderly female sitting on the commode without acute distress reports 3 episodes hemoptysis but getting back HEENT: MM pink/moist Neuro: Grossly intact without focal defect CV: Heart sounds are  distant PULM: Poor air excursion  GI: soft, bsx4 active  GU: Voids Extremities: warm/dry, negative edema  Skin: no rashes or lesions   Resolved Hospital Problem list     Assessment & Plan:   Acute on chronic hypoxic/hypercapnic respiratory failure with hemoptysis. In the setting of pneumonia COPD hemoptysis improving Suspect this is old blood Day 5 antimicrobial therapy No need for bronchoscopy Chest x-ray no significant change Continue to monitor    Chronic rhinitis. As needed Flonase  GERD. PPI c  Bloating/belching. Per primary  Constipation. Prunes have been suggested   Chronic systolic CHF. Per primary  Glaucoma. Per primary  Daughter updated 06/23/2021  Labs    CBC Latest Ref Rng & Units 06/23/2021 06/23/2021 06/22/2021  WBC 4.0 - 10.5 K/uL 12.0(H) 10.5 9.2  Hemoglobin 12.0 - 15.0 g/dL 14/03/2021) 1.9(Q) 10.8(L)  Hematocrit 36.0 - 46.0 % 31.9(L) 30.6(L) 33.2(L)  Platelets 150 - 400 K/uL 234 237 242    CMP Latest Ref Rng & Units 06/23/2021 06/23/2021 06/22/2021  Glucose 70 - 99 mg/dL 14/03/2021) 979(G) 98  BUN 8 - 23 mg/dL 21 18 17   Creatinine 0.44 - 1.00 mg/dL 921(J 9.41  Sodium 135 - 145 mmol/L 134(L) 133(L) 135  Potassium 3.5 - 5.1 mmol/L 5.4(H) 4.2 3.2(L)  Chloride 98 - 111 mmol/L 93(L) 94(L) 91(L)  CO2 22 - 32 mmol/L 36(H) 32 34(H)  Calcium 8.9 - 10.3 mg/dL 7.40) 7.9(L) 8.3(L)  Total Protein 6.5 - 8.1 g/dL 8.14) - -  Total Bilirubin 0.3 - 1.2 mg/dL 0.7 - -  Alkaline Phos 38 - 126 U/L 41 - -  AST 15 - 41 U/L 27 - -  ALT 0 - 44 U/L 30 - -    CBG (last 3)  Recent Labs    06/22/21 2045 06/23/21 0640 06/23/21 1143  GLUCAP 137* 107* 119*   ABG    Component Value Date/Time   PHART 7.304 (L) 06/14/2021 1437   PCO2ART 91.0 (HH) 06/14/2021 1437   PO2ART 146 (H) 06/14/2021 1437   HCO3 43.3 (H) 06/19/2021 1505   TCO2 45 (H) 06/19/2021 1505   O2SAT 70.0 06/19/2021 1505   Signature:  Richardson Landry Asheley Hellberg ACNP Acute Care Nurse Practitioner [place] Please consult Amion 06/23/2021, 1:38 PM

## 2021-06-23 NOTE — TOC Progression Note (Signed)
Transition of Care [facility]) - Progression Note    Patient Details  Name: Rebecca [name] MRN: [id number] Date of Birth: Apr 05, 1941  Transition of Care [facility]) CM/SW Contact  Kailie Polus [place], [place] Phone Number:[phone] 06/23/2021, 9:58 AM  Clinical Narrative:     Patient requesting [facility] for rehab stay. Bed offer pending.  [address], LCSW Transition of Care [phone]   Expected Discharge Plan: Skilled Nursing Facility Barriers to Discharge: Continued Medical Work up, SNF Pending bed offer  Expected Discharge Plan and Services Expected Discharge Plan: Skilled Nursing Facility     Post Acute Care Choice: Skilled Nursing Facility Living arrangements for the past 2 months: Independent Living Facility ([facility], [address], [place])                                       Social Determinants of Health (SDOH) Interventions    Readmission Risk Interventions No flowsheet data found.

## 2021-06-24 DIAGNOSIS — J9621 Acute and chronic respiratory failure with hypoxia: Secondary | ICD-10-CM

## 2021-06-24 DIAGNOSIS — R042 Hemoptysis: Secondary | ICD-10-CM | POA: Diagnosis not present

## 2021-06-24 DIAGNOSIS — J9622 Acute and chronic respiratory failure with hypercapnia: Secondary | ICD-10-CM

## 2021-06-24 DIAGNOSIS — J189 Pneumonia, unspecified organism: Secondary | ICD-10-CM | POA: Diagnosis not present

## 2021-06-24 DIAGNOSIS — J432 Centrilobular emphysema: Secondary | ICD-10-CM | POA: Diagnosis not present

## 2021-06-24 LAB — CULTURE, BLOOD (ROUTINE X 2): Culture: NO GROWTH

## 2021-06-24 LAB — GLUCOSE, CAPILLARY
Glucose-Capillary: 106 mg/dL — ABNORMAL HIGH (ref 70–99)
Glucose-Capillary: 110 mg/dL — ABNORMAL HIGH (ref 70–99)
Glucose-Capillary: 131 mg/dL — ABNORMAL HIGH (ref 70–99)
Glucose-Capillary: 127 mg/dL — ABNORMAL HIGH (ref 70–99)

## 2021-06-24 MED ORDER — CALCIUM GLUCONATE-NACL 1-0.675 GM/50ML-% IV SOLN
1.0000 g | Freq: Once | INTRAVENOUS | Status: AC
  Administered 2021-06-24: 1000 mg via INTRAVENOUS
  Filled 2021-06-24: qty 50

## 2021-06-24 MED ORDER — LOSARTAN POTASSIUM 50 MG PO TABS
100.0000 mg | ORAL_TABLET | Freq: Every day | ORAL | Status: DC
  Administered 2021-06-25 – 2021-06-26 (×2): 100 mg via ORAL
  Filled 2021-06-24 (×2): qty 2

## 2021-06-24 NOTE — Progress Notes (Addendum)
PROGRESS NOTE    Rebecca [name]  WEX:[id number] DOB: 06/21/1941 DOA: 06/19/2021 PCP: Farris Has, MD    Brief Narrative:  Rebecca [name], 80 y.o. 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  Was not prednisone since beginning of November that was increased to 40 mg from 10 during last admission and is scheduled to return to 10 mg from 12/7 In the ED on 5 L nasal cannula CT angio negative for PE but with bilateral lower field infiltrates suspicious for infection hemoglobin 11.5 g stable, PCCM was consulted and admitted   06/23/2021 Seen in the morning, talk to the daughter on the phone Plan was to be discharged Monday, she will need to choose the rehab, [facility] no hemoptysis this morning Hemoglobin was stable   Just called around 11:00 Patient coughing up blood with clots   Repeat chest x-ray Stable bilateral infiltrates  06/24/2021 Many months amount of blood was coughing Pulmonary consult does not seem the source to be pulmonary May be from chronic rhinitis posterior epistaxis May consult ENT Waiting for insurance to approve the rehab to [facility]  ENT consult in the morning was not notified   Assessment & Plan:  Acute on chronic respiratory failure with hemoptysis Patient with history of COPD emphysema 5 L of oxygen Prednisone was changed to 5 mg daily Continue albuterol Pulmicort Brovana     Hemoptysis No chills etiology probably secondary from pneumonia, CT angio negative for PE Pulmonary following bronchoscopy is deferred high risk    Community-acquired pneumonia Day 5 on antibiotic Rocephin and doxycycline Chest x-ray 12/10 stable bilateral infiltrates   Chronic rhinitis Flonase as needed   GERD Protonix  Chronic systolic CHF on Lasix 20 mg daily continue the same, volume status is stable.  She had recent left and right heart cath  in past week.Monitor daily weight intake output. Net IO Since Admission: 261.91 mL [06/21/21 1009]    Was evaluated by cardiology     I reviewed her case with Dr. Eden Emms (attending MD).  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  Her beta-blocker was stopped.  She is maintaining sinus rhythm.  We will hold off on rechallenge in her with beta-blocker at this time.  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 Will increase losartan to 100 mg   HLD: Continue statins  Hyperkalemia resolved   Principal Problem:   Hemoptysis Active Problems:   Hematochezia    DVT prophylaxis: (SCD  Code Status: Full code Family Communication: (Discussed with daughter on the phone Disposition Plan: The plan was to be discharged Monday to rehab or skilled nurse [facility]ose the place by [facility]     Consultants:  Critical care     Antimicrobials:   Rocephin and doxycycline day 6   Subjective: No complaints  Objective: Vitals:   06/23/21 1934 06/23/21  2051 06/24/21 0750 06/24/21 0840  BP: (!) 125/51  (!) 171/78   Pulse: (!) 101  94 84  Resp: 18  17 20   Temp: 98.6 F (37 C)  98.9 F (37.2 C)   TempSrc: Oral  Oral   SpO2: 93% 94% 96% 95%  Weight:      Height:        Intake/Output Summary (Last 24 hours) at 06/24/2021 1244 Last data filed at 06/24/2021 0300 Gross per 24 hour   Intake 339.61 ml  Output 700 ml  Net -360.39 ml   Filed Weights   06/20/21 2136  Weight: 72 kg    Examination:  General exam: Appears calm and comfortable  Respiratory system: Clear to auscultation. Respiratory effort normal. Cardiovascular system: S1 & S2 heard, RRR. No JVD, murmurs, rubs, gallops or clicks. No pedal edema. Gastrointestinal system: Abdomen is nondistended, soft and nontender. No organomegaly or masses felt. Normal bowel sounds heard. Central nervous system: [name] and oriented. No focal neurological deficits. Extremities: Symmetric 5 x 5 power. Skin: No rashes, lesions or ulcers Psychiatry: Judgement and insight appear normal. Mood & affect appropriate.     Data Reviewed: I have personally reviewed following labs and imaging studies  CBC: Recent Labs  Lab 06/19/21 1415 06/19/21 1505 06/20/21 0615 06/21/21 0410 06/22/21 0121 06/23/21 0145 06/23/21 1204  WBC 11.4*  --  11.1* 9.8 9.2 10.5 12.0*  NEUTROABS 10.3*  --   --   --   --   --   --   HGB 11.5*   < > 10.6* 10.4* 10.8* 9.6* 9.9*  HCT 35.1*   < > 32.7* 32.7* 33.2* 30.6* 31.9*  MCV 98.0  --  97.6 100.0 100.3* 102.7* 103.9*  PLT 278  --  259 263 242 237 234   < > = values in this interval not displayed.   Basic Metabolic Panel: Recent Labs  Lab 06/19/21 1419 06/19/21 1505 06/21/21 0410 06/22/21 0121 06/23/21 0145 06/23/21 1204 06/23/21 1523  NA  --    < > 134* 135 133* 134* 133*  K  --    < > 4.2 3.2* 4.2 5.4* 5.0  CL  --    < > 88* 91* 94* 93* 92*  CO2  --    < > 38* 34* 32 36* 31  GLUCOSE  --    < > 99 98 104* 160* 125*  BUN  --    < > 14 17 18 21 22   CREATININE  --    < > 0.70 0.66 0.72 0.76 0.68  CALCIUM  --    < > 8.5* 8.3* 7.9* 8.4* 8.6*  MG 1.8  --   --   --   --   --   --    < > = values in this interval not displayed.   GFR: Estimated Creatinine Clearance: 52.2 mL/min (by C-G formula based on SCr of 0.68 mg/dL). Liver Function Tests: Recent Labs  Lab 06/19/21 1415  06/23/21 1204  AST 34 27  ALT 30 30  ALKPHOS 50 41  BILITOT 0.9 0.7  PROT 6.7 5.4*  ALBUMIN 3.8 3.0*   No results for input(s): LIPASE, AMYLASE in the last 168 hours. No results for input(s): AMMONIA in the last 168 hours. Coagulation Profile: Recent Labs  Lab 06/20/21 1405 06/23/21 1204  INR 1.0 0.9   Cardiac Enzymes: No results for input(s): CKTOTAL, CKMB, CKMBINDEX, TROPONINI in the last 168 hours. BNP (last 3 results) Recent Labs  04/10/21 1518  PROBNP 179   HbA1C: No results for input(s): HGBA1C in the last 72 hours. CBG: Recent Labs  Lab 06/23/21 1143 06/23/21 1604 06/23/21 2024 06/24/21 0749 06/24/21 1206  GLUCAP 119* 101* 148* 106* 110*   Lipid Profile: No results for input(s): CHOL, HDL, LDLCALC, TRIG, CHOLHDL, LDLDIRECT in the last 72 hours. Thyroid Function Tests: No results for input(s): TSH, T4TOTAL, FREET4, T3FREE, THYROIDAB in the last 72 hours. Anemia Panel: No results for input(s): VITAMINB12, FOLATE, FERRITIN, TIBC, IRON, RETICCTPCT in the last 72 hours. Sepsis Labs: Recent Labs  Lab 06/19/21 1640 06/23/21 1528  PROCALCITON  --  <0.10  LATICACIDVEN 0.9  --     Recent Results (from the past 240 hour(s))  Resp Panel by RT-PCR (Flu A&B, Covid) Nasopharyngeal Swab     Status: None   Collection Time: 06/14/21  5:36 PM   Specimen: Nasopharyngeal Swab; Nasopharyngeal(NP) swabs in vial transport medium  Result Value Ref Range Status   SARS Coronavirus 2 by RT PCR NEGATIVE NEGATIVE Final    Comment: (NOTE) SARS-CoV-2 target nucleic acids are NOT DETECTED.  The SARS-CoV-2 RNA is generally detectable in upper respiratory specimens during the acute phase of infection. The lowest concentration of SARS-CoV-2 viral copies this assay can detect is 138 copies/mL. A negative result does not preclude SARS-Cov-2 infection and should not be used as the sole basis for treatment or other patient management decisions. A negative result may occur with   improper specimen collection/handling, submission of specimen other than nasopharyngeal swab, presence of viral mutation(s) within the areas targeted by this assay, and inadequate number of viral copies(<138 copies/mL). A negative result must be combined with clinical observations, patient history, and epidemiological information. The expected result is Negative.  Fact Sheet for Patients:  BloggerCourse.com  Fact Sheet for Healthcare Providers:  SeriousBroker.it  This test is no t yet approved or cleared by the [place] FDA and  has been authorized for detection and/or diagnosis of SARS-CoV-2 by FDA under an Emergency Use Authorization (EUA). This EUA will remain  in effect (meaning this test can be used) for the duration of the COVID-19 declaration under Section 564(b)(1) of the Act, 21 U.S.C.section 360bbb-3(b)(1), unless the authorization is terminated  or revoked sooner.       Influenza A by PCR NEGATIVE NEGATIVE Final   Influenza B by PCR NEGATIVE NEGATIVE Final    Comment: (NOTE) The Xpert Xpress SARS-CoV-2/FLU/RSV plus assay is intended as an aid in the diagnosis of influenza from Nasopharyngeal swab specimens and should not be used as a sole basis for treatment. Nasal washings and aspirates are unacceptable for Xpert Xpress SARS-CoV-2/FLU/RSV testing.  Fact Sheet for Patients: BloggerCourse.com  Fact Sheet for Healthcare Providers: SeriousBroker.it  This test is not yet approved or cleared by the [place] FDA and has been authorized for detection and/or diagnosis of SARS-CoV-2 by FDA under an Emergency Use Authorization (EUA). This EUA will remain in effect (meaning this test can be used) for the duration of the COVID-19 declaration under Section 564(b)(1) of the Act, 21 U.S.C. section 360bbb-3(b)(1), unless the authorization is terminated  or revoked.  Performed at [facility], [address] [place], [place] [address]   MRSA Next Gen by PCR, Nasal     Status: None   Collection Time: 06/14/21  5:36 PM   Specimen: Nasopharyngeal Swab; Nasal Swab  Result Value Ref Range Status   MRSA by PCR Next Gen NOT DETECTED NOT DETECTED Final  Comment: (NOTE) The GeneXpert MRSA Assay (FDA approved for NASAL specimens only), is one component of a comprehensive MRSA colonization surveillance program. It is not intended to diagnose MRSA infection nor to guide or monitor treatment for MRSA infections. Test performance is not FDA approved in patients less than 97 years old. Performed at [facility], [address] [place], [place] [address]   Resp Panel by RT-PCR (Flu A&B, Covid) Nasopharyngeal Swab     Status: None   Collection Time: 06/19/21  4:29 PM   Specimen: Nasopharyngeal Swab; Nasopharyngeal(NP) swabs in vial transport medium  Result Value Ref Range Status   SARS Coronavirus 2 by RT PCR NEGATIVE NEGATIVE Final    Comment: (NOTE) SARS-CoV-2 target nucleic acids are NOT DETECTED.  The SARS-CoV-2 RNA is generally detectable in upper respiratory specimens during the acute phase of infection. The lowest concentration of SARS-CoV-2 viral copies this assay can detect is 138 copies/mL. A negative result does not preclude SARS-Cov-2 infection and should not be used as the sole basis for treatment or other patient management decisions. A negative result may occur with  improper specimen collection/handling, submission of specimen other than nasopharyngeal swab, presence of viral mutation(s) within the areas targeted by this assay, and inadequate number of viral copies(<138 copies/mL). A negative result must be combined with clinical observations, patient history, and epidemiological information. The expected result is Negative.  Fact Sheet for Patients:  BloggerCourse.com  Fact Sheet for  Healthcare Providers:  SeriousBroker.it  This test is no t yet approved or cleared by the [place] FDA and  has been authorized for detection and/or diagnosis of SARS-CoV-2 by FDA under an Emergency Use Authorization (EUA). This EUA will remain  in effect (meaning this test can be used) for the duration of the COVID-19 declaration under Section 564(b)(1) of the Act, 21 U.S.C.section 360bbb-3(b)(1), unless the authorization is terminated  or revoked sooner.       Influenza A by PCR NEGATIVE NEGATIVE Final   Influenza B by PCR NEGATIVE NEGATIVE Final    Comment: (NOTE) The Xpert Xpress SARS-CoV-2/FLU/RSV plus assay is intended as an aid in the diagnosis of influenza from Nasopharyngeal swab specimens and should not be used as a sole basis for treatment. Nasal washings and aspirates are unacceptable for Xpert Xpress SARS-CoV-2/FLU/RSV testing.  Fact Sheet for Patients: BloggerCourse.com  Fact Sheet for Healthcare Providers: SeriousBroker.it  This test is not yet approved or cleared by the [place] FDA and has been authorized for detection and/or diagnosis of SARS-CoV-2 by FDA under an Emergency Use Authorization (EUA). This EUA will remain in effect (meaning this test can be used) for the duration of the COVID-19 declaration under Section 564(b)(1) of the Act, 21 U.S.C. section 360bbb-3(b)(1), unless the authorization is terminated or revoked.  Performed at [facility], [address], [place], [place] [address]   Blood culture (routine x 2)     Status: None   Collection Time: 06/19/21  4:40 PM   Specimen: BLOOD  Result Value Ref Range Status   Specimen Description BLOOD LEFT ANTECUBITAL  Final   Special Requests   Final    BOTTLES DRAWN AEROBIC AND ANAEROBIC Blood Culture adequate volume   Culture  Setup Time   Final    GRAM POSITIVE RODS AEROBIC BOTTLE ONLY CRITICAL RESULT CALLED  TO, READ BACK BY AND VERIFIED WITH: [email or web address]  06/24/21 MK    Culture   Final    NO GROWTH 5 DAYS Performed at [facility], [address] [place], [place] [address]    Report Status 06/24/2021 FINAL  Final  Blood culture (routine x 2)     Status: None   Collection Time: 06/19/21  5:04 PM   Specimen: BLOOD LEFT HAND  Result Value Ref Range Status   Specimen Description BLOOD LEFT HAND  Final   Special Requests   Final    BOTTLES DRAWN AEROBIC AND ANAEROBIC Blood Culture results may not be optimal due to an inadequate volume of blood received in culture bottles   Culture   Final    NO GROWTH 5 DAYS Performed at [facility], [address] [place], [place] [address]    Report Status 06/24/2021 FINAL  Final         Radiology Studies: DG Chest 2 View  Result Date: 06/23/2021 CLINICAL DATA:  Hemoptysis EXAM: CHEST - 2 VIEW COMPARISON:  06/19/2021 FINDINGS: Left lower lobe infiltrate unchanged. Mild right lower lobe airspace disease unchanged Apical emphysema.  No pneumothorax or heart failure. IMPRESSION: Bibasilar airspace disease left greater than right unchanged. Electronically Signed   By: Marlan Palau M.D.   On: 06/23/2021 09:48        Scheduled Meds:  arformoterol  15 mcg Nebulization BID   benzonatate  200 mg Oral TID   brimonidine  1 drop Left Eye BID   budesonide  0.5 mg Nebulization BID   docusate sodium  100 mg Oral Daily   doxycycline  100 mg Oral Q12H   estradiol  1 Applicatorful Vaginal QODAY   ezetimibe  10 mg Oral Daily   furosemide  20 mg Oral Daily   insulin aspart  0-15 Units Subcutaneous TID WC   ipratropium-albuterol  3 mL Nebulization QID   [START ON 06/25/2021] losartan  100 mg Oral Daily   pantoprazole  40 mg Oral Q1200   polyethylene glycol  17 g Oral Daily   predniSONE  5 mg Oral Q breakfast   sodium chloride  2 spray Each Nare BID   Continuous Infusions:  calcium gluconate     cefTRIAXone (ROCEPHIN)  IV 1 g  (06/23/21 2314)     LOS: 5 days    Time spent: 35 minutes    Cebert Dettmann G Moriya Mitchell, MD [facility]   If 7PM-7AM, please contact night-coverage [email or web address] Password [facility] 06/24/2021, 12:44 PM

## 2021-06-24 NOTE — Progress Notes (Signed)
PHARMACY - PHYSICIAN COMMUNICATION CRITICAL VALUE ALERT - BLOOD CULTURE IDENTIFICATION (BCID)  Rebecca [name] is an 80 y.o. female who presented to [facility] on 06/19/2021 with hemoptysis and CAP  Assessment:  Blood cultures show GPR in 1/4 bottles. Likely contaminant  Name of physician (or Provider) Contacted: X. Blount  Current antibiotics: Rocephin  Changes to prescribed antibiotics recommended:  Patient is on recommended antibiotics - No changes needed   Harland German, PharmD Clinical Pharmacist **Pharmacist phone directory can now be found on [email or web address] ([email or web address]).  Listed under [facility].

## 2021-06-24 NOTE — Progress Notes (Addendum)
NAME:  Rebecca [name], MRN:  [id number], DOB:  Jun 20, 1941, LOS: 5 ADMISSION DATE:  06/19/2021, CONSULTATION DATE:  06/19/21 REFERRING MD:  Dr Darl Householder, CHIEF COMPLAINT:  hemoptysis   History of Present Illness:  80 yo female presented with worsening dyspnea, sinus congestion, and cough with hemoptysis starting 12/02.  Was recently started on prednisone for COPD exacerbation as outpt.  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  She is followed by Dr. Elsworth Soho in pulmonary office for severe COPD with emphysema and chronic respiratory failure on 5 liters oxygen.  She had referral placed for palliative care assessment as outpt on 05/24/21.  Pertinent  Medical History  COPD, DM, HLD, Osteoporosis, GERD, Glaucoma, Rhinitis, systolic CHF  Studies:   Spirometry 02/05/18 >> FEV1 0.6 (31%), FEV1% 43 Echo 08/18/20 >> EF 40 to 45%, grade 1 DD CT-PA 06/19/2021 >> severe centrilobular and bullous emphysema, basilar ASD and GGO  Interim History / Subjective:  Feeling shaky. Small amount of foamy looking blood this morning. Nothing further all day  Objective   Blood pressure (!) 141/67, pulse (!) 105, temperature 98.7 F (37.1 C), temperature source Oral, resp. rate 18, height 5\' 2"  (1.575 m), weight 72 kg, SpO2 97 %.        Intake/Output Summary (Last 24 hours) at 06/24/2021 1658 Last data filed at 06/24/2021 1600 Gross per 24 hour  Intake 155 ml  Output 350 ml  Net -195 ml   Filed Weights   06/20/21 2136  Weight: 72 kg    Examination:  Elderly no distress No increased work of breathing On nasal cannula No blood in oropharynx Lung sounds diminished   Resolved Hospital Problem list     Assessment & Plan:   Acute on chronic hypoxic/hypercapnic respiratory failure with hemoptysis. Severe centrilobular emphysema Suspect this is old blood, possible from coughing. Less likely  epistaxis.  Completed five days of antimicrobial therapy.  No need for bronchoscopy based on decreasing amounts of hemoptysis - high risk and low reward given her chronic respiratory failure Chest x-ray no significant change Continue to monitor  Chronic rhinitis. As needed Flonase Surgi-lube in nares as needed for moisture  Goals of Care Daughter Rebecca [name] updated by phone 06/24/21  I discussed at length Rebecca [name]'s condition, hemoptysis, and goals of care with Rebecca [name]. She discussed they have palliative care referral pending.  At this point I am recommending a DNR based on advanced lung disease and progressing symptoms. She says she would like to make things more official but mother has been in a little denial about how sick she is.  Encouraged them to bring it up with Tammy Parrett at upcoming follow up appointment.  PCCM will see as needed - no objection to discharge from pulmonary perspective  Please call with questions  I spent 35 minutes in care of this patient including chart and data review, discussing with family and care team at bedside, coordination of care.   Lenice Llamas, MD Pulmonary and [place] 06/24/2021 5:15 PM Pager: see AMION  If no response to pager, please call critical care on call (see AMION) until 7pm After 7:00 pm call Elink      Labs    CBC Latest Ref Rng & Units 06/23/2021 06/23/2021 06/22/2021  WBC 4.0 - 10.5 K/uL 12.0(H) 10.5 9.2  Hemoglobin 12.0 - 15.0 g/dL 9.9(L) 9.6(L) 10.8(L)  Hematocrit 36.0 - 46.0 %  31.9(L) 30.6(L) 33.2(L)  Platelets 150 - 400 K/uL 234 237 242    CMP Latest Ref Rng & Units 06/23/2021 06/23/2021 06/23/2021  Glucose 70 - 99 mg/dL 409(W) 119(J) 478(G)  BUN 8 - 23 mg/dL 22 21 18   Creatinine 0.44 - 1.00 mg/dL 9.56 2.13  Sodium 135 - 145 mmol/L 133(L) 134(L) 133(L)  Potassium 3.5 - 5.1 mmol/L 5.0 5.4(H) 4.2  Chloride 98 - 111 mmol/L 92(L) 93(L) 94(L)  CO2 22 - 32 mmol/L 31 36(H) 32  Calcium  8.9 - 10.3 mg/dL 0.86) 5.7(Q) 7.9(L)  Total Protein 6.5 - 8.1 g/dL - 5.4(L) -  Total Bilirubin 0.3 - 1.2 mg/dL - 0.7 -  Alkaline Phos 38 - 126 U/L - 41 -  AST 15 - 41 U/L - 27 -  ALT 0 - 44 U/L - 30 -    CBG (last 3)  Recent Labs    06/24/21 0749 06/24/21 1206 06/24/21 1550  GLUCAP 106* 110* 131*   ABG    Component Value Date/Time   PHART 7.304 (L) 06/14/2021 1437   PCO2ART 91.0 (HH) 06/14/2021 1437   PO2ART 146 (H) 06/14/2021 1437   HCO3 43.3 (H) 06/19/2021 1505   TCO2 45 (H) 06/19/2021 1505   O2SAT 70.0 06/19/2021 1505

## 2021-06-24 NOTE — Progress Notes (Signed)
Pt coughed up small amount of bloody sputum. This is first occurrence since yesterday afternoon.  No change in pt status overnight, no increased SOB, or any distress noted.  Will continue to monitor pt.

## 2021-06-25 LAB — CBC
HCT: 29.6 % — ABNORMAL LOW (ref 36.0–46.0)
Hemoglobin: 9.5 g/dL — ABNORMAL LOW (ref 12.0–15.0)
MCH: 32.9 pg (ref 26.0–34.0)
MCHC: 32.1 g/dL (ref 30.0–36.0)
MCV: 102.4 fL — ABNORMAL HIGH (ref 80.0–100.0)
Platelets: 230 10*3/uL (ref 150–400)
RBC: 2.89 MIL/uL — ABNORMAL LOW (ref 3.87–5.11)
RDW: 13.4 % (ref 11.5–15.5)
WBC: 9 10*3/uL (ref 4.0–10.5)
nRBC: 0 % (ref 0.0–0.2)

## 2021-06-25 LAB — RESP PANEL BY RT-PCR (FLU A&B, COVID) ARPGX2
Influenza A by PCR: NEGATIVE
Influenza B by PCR: NEGATIVE
SARS Coronavirus 2 by RT PCR: NEGATIVE

## 2021-06-25 LAB — BASIC METABOLIC PANEL
Anion gap: 8 (ref 5–15)
BUN: 13 mg/dL (ref 8–23)
CO2: 35 mmol/L — ABNORMAL HIGH (ref 22–32)
Calcium: 8.6 mg/dL — ABNORMAL LOW (ref 8.9–10.3)
Chloride: 89 mmol/L — ABNORMAL LOW (ref 98–111)
Creatinine, Ser: 0.68 mg/dL (ref 0.44–1.00)
GFR, Estimated: >60 mL/min (ref >60)
Glucose, Bld: 100 mg/dL — ABNORMAL HIGH (ref 70–99)
Potassium: 3.8 mmol/L (ref 3.5–5.1)
Sodium: 132 mmol/L — ABNORMAL LOW (ref 135–145)

## 2021-06-25 LAB — GLUCOSE, CAPILLARY
Glucose-Capillary: 137 mg/dL — ABNORMAL HIGH (ref 70–99)
Glucose-Capillary: 133 mg/dL — ABNORMAL HIGH (ref 70–99)
Glucose-Capillary: 103 mg/dL — ABNORMAL HIGH (ref 70–99)
Glucose-Capillary: 128 mg/dL — ABNORMAL HIGH (ref 70–99)

## 2021-06-25 NOTE — Progress Notes (Signed)
Physical Therapy Treatment Patient Details Name: Rebecca [name] MRN: [id number] DOB: 14-Aug-1940 Today's Date: 06/25/2021   History of Present Illness 80 yo female presents to [facility] on 12/6 for hemoptysis since heart cath (procedure terminated due to third degree heart block, temporary pacemaker needed) 12/1. Workup for respiratory failure suspect due to COPD and PNA. 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    PT Comments    Patient progressing slowly towards PT goals. Reports not moving much over the weekend and feeling weaker today. Tolerated short bouts of gait with Min guard assist and use of RW for support with multiple rest breaks. Sp02 remained >93% on 4L/min 02 [place] and HR up to 125 bpm max. Pt with 3/4 DOE. Continue to recommend SNF as pt is not at functional baseline and not safe to be home alone at this time. Will follow.    Recommendations for follow up therapy are one component of a multi-disciplinary discharge planning process, led by the attending physician.  Recommendations may be updated based on patient status, additional functional criteria and insurance authorization.  Follow Up Recommendations  Skilled nursing-short term rehab (<3 hours/day)     Assistance Recommended at Discharge Intermittent Supervision/Assistance  Equipment Recommendations  None recommended by PT    Recommendations for Other Services       Precautions / Restrictions Precautions Precautions: Fall Restrictions Weight Bearing Restrictions: No     Mobility  Bed Mobility               General bed mobility comments: Up in chair upon PT arrival.    Transfers Overall transfer level: Needs assistance Equipment used: Rolling walker (2 wheels) Transfers: Sit to/from Stand Sit to Stand: Min guard           General transfer comment: Stood  from chair x3, good placement of hands.    Ambulation/Gait Ambulation/Gait assistance: Min guard Gait Distance (Feet): 12 Feet (+ 8' + 6') Assistive device: Rolling walker (2 wheels) Gait Pattern/deviations: Step-through pattern;Decreased stride length;Trunk flexed Gait velocity: decr Gait velocity interpretation: <1.31 ft/sec, indicative of household ambulator   General Gait Details: Slow, guarded gait with af ew standing rest breaks and 2 seated rest breaks. 3/4 DOE. Sp02 remained >93% on 4L/min 02 . HR up to 125 bpm max. Reports legs feel weak.   Stairs             Wheelchair Mobility    Modified Rankin (Stroke Patients Only)       Balance Overall balance assessment: Needs assistance;History of Falls Sitting-balance support: No upper extremity supported;Feet supported Sitting balance-Leahy Scale: Good     Standing balance support: During functional activity;Reliant on assistive device for balance Standing balance-Leahy Scale: Poor Standing balance comment: Requires UE support on RW.                            Cognition Arousal/Alertness: Awake/alert Behavior During Therapy: WFL for tasks assessed/performed Overall Cognitive Status: Within Functional Limits for tasks assessed                                          Exercises      General Comments General comments (skin integrity, edema, etc.): Daughter present during session.  Pertinent Vitals/Pain Pain Assessment: No/denies pain    Home Living                          Prior Function            PT Goals (current goals can now be found in the care plan section) Progress towards PT goals: Progressing toward goals    Frequency    Min 3X/week      PT Plan Current plan remains appropriate    Co-evaluation              AM-PAC PT "6 Clicks" Mobility   Outcome Measure  Help needed turning from your back to your side while in a flat bed  without using bedrails?: A Little Help needed moving from lying on your back to sitting on the side of a flat bed without using bedrails?: A Little Help needed moving to and from a bed to a chair (including a wheelchair)?: A Little Help needed standing up from a chair using your arms (e.g., wheelchair or bedside chair)?: A Little Help needed to walk in hospital room?: A Little Help needed climbing 3-5 steps with a railing? : A Lot 6 Click Score: 17    End of Session Equipment Utilized During Treatment: Oxygen;Gait belt Activity Tolerance: Patient limited by fatigue Patient left: in chair;with call bell/phone within reach;with family/visitor present Nurse Communication: Mobility status PT Visit Diagnosis: Unsteadiness on feet (R26.81);Muscle weakness (generalized) (M62.81)     Time: 1093-2355 PT Time Calculation (min) (ACUTE ONLY): 25 min  Charges:  $Gait Training: 8-22 mins $Therapeutic Activity: 8-22 mins                     Vale Haven, PT, DPT Acute Rehabilitation Services Pager [phone] Office [phone]      Rebecca [name] 06/25/2021, 12:30 PM

## 2021-06-25 NOTE — Progress Notes (Signed)
[facility] PROGRESS NOTE    Progress Note  Rebecca [name]  [id number] DOB: 19-Dec-1940 DOA: 06/19/2021 PCP: London Pepper, MD     Brief Narrative:   Rebecca [name] is an 80 y.o. female past medical history of nonischemic cardiomyopathy with an EF of 40%, chronic hypoxic and hypercarbic respiratory failure due to COPD on 5 L of oxygen hyperlipidemia, who had an elective cardiac cath that showed nonobstructive disease was complicated by third-degree AV block and needed pacing also with acute dyspnea for which she was treated with steroids came into the hospital with dyspnea and hemoptysis did not really respond to steroids   Assessment/Plan:   Acute on chronic respiratory failure with hypoxia and hemoptysis: Pulmonary was consulted they suspect there is old blood from coughing he completed a 5-day course of antibiotics. Now back on home dose of oxygen Now back to 5 mg of steroids. No changes overnight. Awaiting placement to skilled nursing facility.  Chronic rhinitis: Continue Flonase as needed.  Chronic systolic heart failure: Continue Lasix, appears euvolemic on physical exam  Complete AV block: Visit.  During catheterization in the setting of a left bundle branch block his beta-blockers were stopped he is now in sinus rhythm we will continue to hold.  Insulin-dependent diabetes mellitus type 2: Hold metformin, continue sliding scale blood glucose is fairly controlled on current regimen will continue. Can go back to her home regimen once discharged.  Essential hypertension: Fairly controlled continue losartan.  Hyperlipidemia: Excellent continue statins.  Hyperkalemia: Now resolved.     DVT prophylaxis: lovenox Family Communication:none Status is: Inpatient  Remains inpatient appropriate because: Patient medically stable to be transferred to skilled, they have decided in [place].        Code Status:     Code Status Orders  (From  admission, onward)           Start     Ordered   06/19/21 1712  Full code  Continuous        06/19/21 1712           Code Status History     Date Active Date Inactive Code Status Order ID Comments User Context   06/14/2021 1719 06/15/2021 1800 Full Code [id number]  [name], [name], MD Inpatient   01/12/2020 2008 01/17/2020 2326 Partial Code [id number]  Elmarie Shiley, MD ED   01/12/2020 1738 01/12/2020 2008 Partial Code [id number]  Elmarie Shiley, MD ED         IV Access:   Peripheral IV   Procedures and diagnostic studies:   No results found.   Medical Consultants:   None.   Subjective:    [name] no events overnight  Objective:    Vitals:   06/24/21 2029 06/25/21 0450 06/25/21 0810 06/25/21 0831  BP: 140/72 (!) 128/52  (!) 133/93  Pulse: 86 100  88  Resp: 20 20  17   Temp: 98.5 F (36.9 C) 98.6 F (37 C)  99.1 F (37.3 C)  TempSrc: Oral   Oral  SpO2: 98% 99% 99% 90%  Weight:      Height:       SpO2: 90 % O2 Flow Rate (L/min): 5 L/min   Intake/Output Summary (Last 24 hours) at 06/25/2021 1025 Last data filed at 06/24/2021 1600 Gross per 24 hour  Intake 55 ml  Output 350 ml  Net -295 ml   Filed Weights   06/20/21 2136  Weight: 72 kg    Exam: General exam:  In no acute distress. Respiratory system: Good air movement and clear to auscultation. Cardiovascular system: S1 & S2 heard, RRR. No JVD. Gastrointestinal system: Abdomen is nondistended, soft and nontender.  Extremities: No pedal edema. Skin: No rashes, lesions or ulcers Psychiatry: Judgement and insight appear normal. Mood & affect appropriate.   Data Reviewed:    Labs: Basic Metabolic Panel: Recent Labs  Lab 06/19/21 1419 06/19/21 1505 06/22/21 0121 06/23/21 0145 06/23/21 1204 06/23/21 1523 06/25/21 0258  NA  --    < > 135 133* 134* 133* 132*  K  --    < > 3.2* 4.2 5.4* 5.0 3.8  CL  --    < > 91* 94* 93* 92* 89*  CO2  --    < > 34* 32 36* 31 35*   GLUCOSE  --    < > 98 104* 160* 125* 100*  BUN  --    < > 17 18 21 22 13   CREATININE  --    < > 0.66 0.72 0.76 0.68 0.68  CALCIUM  --    < > 8.3* 7.9* 8.4* 8.6* 8.6*  MG 1.8  --   --   --   --   --   --    < > = values in this interval not displayed.   GFR Estimated Creatinine Clearance: 52.2 mL/min (by C-G formula based on SCr of 0.68 mg/dL). Liver Function Tests: Recent Labs  Lab 06/19/21 1415 06/23/21 1204  AST 34 27  ALT 30 30  ALKPHOS 50 41  BILITOT 0.9 0.7  PROT 6.7 5.4*  ALBUMIN 3.8 3.0*   No results for input(s): LIPASE, AMYLASE in the last 168 hours. No results for input(s): AMMONIA in the last 168 hours. Coagulation profile Recent Labs  Lab 06/20/21 1405 06/23/21 1204  INR 1.0 0.9   COVID-19 Labs  No results for input(s): DDIMER, FERRITIN, LDH, CRP in the last 72 hours.  Lab Results  Component Value Date   SARSCOV2NAA NEGATIVE 06/19/2021   [place] NEGATIVE 06/14/2021   [place] NEGATIVE 01/12/2020   [place] NEGATIVE 09/28/2019    CBC: Recent Labs  Lab 06/19/21 1415 06/19/21 1505 06/21/21 0410 06/22/21 0121 06/23/21 0145 06/23/21 1204 06/25/21 0258  WBC 11.4*   < > 9.8 9.2 10.5 12.0* 9.0  NEUTROABS 10.3*  --   --   --   --   --   --   HGB 11.5*   < > 10.4* 10.8* 9.6* 9.9* 9.5*  HCT 35.1*   < > 32.7* 33.2* 30.6* 31.9* 29.6*  MCV 98.0   < > 100.0 100.3* 102.7* 103.9* 102.4*  PLT 278   < > 263 242 237 234 230   < > = values in this interval not displayed.   Cardiac Enzymes: No results for input(s): CKTOTAL, CKMB, CKMBINDEX, TROPONINI in the last 168 hours. BNP (last 3 results) Recent Labs    04/10/21 1518  PROBNP 179   CBG: Recent Labs  Lab 06/24/21 0749 06/24/21 1206 06/24/21 1550 06/24/21 2028 06/25/21 0834  GLUCAP 106* 110* 131* 127* 137*   D-Dimer: No results for input(s): DDIMER in the last 72 hours. Hgb A1c: No results for input(s): HGBA1C in the last 72 hours. Lipid Profile: No results for input(s): CHOL,  HDL, LDLCALC, TRIG, CHOLHDL, LDLDIRECT in the last 72 hours. Thyroid function studies: No results for input(s): TSH, T4TOTAL, T3FREE, THYROIDAB in the last 72 hours.  Invalid input(s): FREET3 Anemia work up: No results for input(s): VITAMINB12, FOLATE,  FERRITIN, TIBC, IRON, RETICCTPCT in the last 72 hours. Sepsis Labs: Recent Labs  Lab 06/19/21 1640 06/20/21 0615 06/22/21 0121 06/23/21 0145 06/23/21 1204 06/23/21 1528 06/25/21 0258  PROCALCITON  --   --   --   --   --  <0.10  --   WBC  --    < > 9.2 10.5 12.0*  --  9.0  LATICACIDVEN 0.9  --   --   --   --   --   --    < > = values in this interval not displayed.   Microbiology Recent Results (from the past 240 hour(s))  Resp Panel by RT-PCR (Flu A&B, Covid) Nasopharyngeal Swab     Status: None   Collection Time: 06/19/21  4:29 PM   Specimen: Nasopharyngeal Swab; Nasopharyngeal(NP) swabs in vial transport medium  Result Value Ref Range Status   SARS Coronavirus 2 by RT PCR NEGATIVE NEGATIVE Final    Comment: (NOTE) SARS-CoV-2 target nucleic acids are NOT DETECTED.  The SARS-CoV-2 RNA is generally detectable in upper respiratory specimens during the acute phase of infection. The lowest concentration of SARS-CoV-2 viral copies this assay can detect is 138 copies/mL. A negative result does not preclude SARS-Cov-2 infection and should not be used as the sole basis for treatment or other patient management decisions. A negative result may occur with  improper specimen collection/handling, submission of specimen other than nasopharyngeal swab, presence of viral mutation(s) within the areas targeted by this assay, and inadequate number of viral copies(<138 copies/mL). A negative result must be combined with clinical observations, patient history, and epidemiological information. The expected result is Negative.  Fact Sheet for Patients:  BloggerCourse.com  Fact Sheet for Healthcare Providers:   SeriousBroker.it  This test is no t yet approved or cleared by the [place] FDA and  has been authorized for detection and/or diagnosis of SARS-CoV-2 by FDA under an Emergency Use Authorization (EUA). This EUA will remain  in effect (meaning this test can be used) for the duration of the COVID-19 declaration under Section 564(b)(1) of the Act, 21 U.S.C.section 360bbb-3(b)(1), unless the authorization is terminated  or revoked sooner.       Influenza A by PCR NEGATIVE NEGATIVE Final   Influenza B by PCR NEGATIVE NEGATIVE Final    Comment: (NOTE) The Xpert Xpress SARS-CoV-2/FLU/RSV plus assay is intended as an aid in the diagnosis of influenza from Nasopharyngeal swab specimens and should not be used as a sole basis for treatment. Nasal washings and aspirates are unacceptable for Xpert Xpress SARS-CoV-2/FLU/RSV testing.  Fact Sheet for Patients: BloggerCourse.com  Fact Sheet for Healthcare Providers: SeriousBroker.it  This test is not yet approved or cleared by the [place] FDA and has been authorized for detection and/or diagnosis of SARS-CoV-2 by FDA under an Emergency Use Authorization (EUA). This EUA will remain in effect (meaning this test can be used) for the duration of the COVID-19 declaration under Section 564(b)(1) of the Act, 21 U.S.C. section 360bbb-3(b)(1), unless the authorization is terminated or revoked.  Performed at [facility], [address], [place], [place] [address]   Blood culture (routine x 2)     Status: None (Preliminary result)   Collection Time: 06/19/21  4:40 PM   Specimen: BLOOD  Result Value Ref Range Status   Specimen Description BLOOD LEFT ANTECUBITAL  Final   Special Requests   Final    BOTTLES DRAWN AEROBIC AND ANAEROBIC Blood Culture adequate volume   Culture  Setup Time  Final    GRAM POSITIVE RODS AEROBIC BOTTLE ONLY CRITICAL RESULT CALLED  TO, READ BACK BY AND VERIFIED WITH: [email or web address]  06/24/21 [place] Performed at [facility], [place] [address], [place], [place] [address]    Culture   Final    GRAM POSITIVE RODS CORRECTED ON 12/12 AT [id number]: PREVIOUSLY REPORTED AS NO GROWTH 5 DAYS   Report Status PENDING  Incomplete  Blood culture (routine x 2)     Status: None   Collection Time: 06/19/21  5:04 PM   Specimen: BLOOD LEFT HAND  Result Value Ref Range Status   Specimen Description BLOOD LEFT HAND  Final   Special Requests   Final    BOTTLES DRAWN AEROBIC AND ANAEROBIC Blood Culture results may not be optimal due to an inadequate volume of blood received in culture bottles   Culture   Final    NO GROWTH 5 DAYS Performed at [facility], [place] [address], [place], [place] [address]    Report Status 06/24/2021 FINAL  Final     Medications:    arformoterol  15 mcg Nebulization BID   benzonatate  200 mg Oral TID   brimonidine  1 drop Left Eye BID   budesonide  0.5 mg Nebulization BID   docusate sodium  100 mg Oral Daily   estradiol  1 Applicatorful Vaginal QODAY   ezetimibe  10 mg Oral Daily   furosemide  20 mg Oral Daily   insulin aspart  0-15 Units Subcutaneous TID WC   ipratropium-albuterol  3 mL Nebulization QID   losartan  100 mg Oral Daily   pantoprazole  40 mg Oral Q1200   polyethylene glycol  17 g Oral Daily   predniSONE  5 mg Oral Q breakfast   sodium chloride  2 spray Each Nare BID   Continuous Infusions:  cefTRIAXone (ROCEPHIN)  IV 1 g (06/24/21 2326)      LOS: 6 days   Charlynne Cousins  [facility]  06/25/2021, 10:25 AM

## 2021-06-25 NOTE — Discharge Summary (Addendum)
Physician Discharge Summary  MIKEISHA LEMONDS ZRA:[id number] DOB: 01-14-1941 DOA: 06/19/2021  PCP: Farris Has, MD  Admit date: 06/19/2021 Discharge date: 06/26/2021  Admitted From: Home Disposition:  SNF  Recommendations for Outpatient Follow-up:  Follow up with PCP in 1-2 weeks Please obtain BMP/CBC in one week Hospice and palliative care to meet with family at facility to discuss end-of-life and goals of care  Home Health:No Equipment/Devices:none  Discharge Condition:Stable CODE STATUS:Full Diet recommendation: Heart Healthy   Brief/Interim Summary: 80 y.o. female past medical history of nonischemic cardiomyopathy with an EF of 40%, chronic hypoxic and hypercarbic respiratory failure due to COPD on 5 L of oxygen hyperlipidemia, who had an elective cardiac cath that showed nonobstructive disease was complicated by third-degree AV block and needed pacing also with acute dyspnea for which she was treated with steroids came into the hospital with dyspnea and hemoptysis did not really respond to steroids  Discharge Diagnoses:  Principal Problem:   Hemoptysis Active Problems:   Hematochezia  Acute on chronic respiratory failure with hypoxia and hemoptysis possibly secondary to aspiration pneumonia: Pulmonary was consulted they suspect there is probably a pneumonic infectious process, completed a 5-day course of antibiotics. CT angio of the chest was negative for PE but it did show groundglass opacities No need for bronchoscopy. Chest x-ray is significantly unchanged Now back to 5 mg of steroids.   Chronic rhinitis: Continue Flonase as needed.  Chronic systolic heart failure: Continue Lasix, appears euvolemic on physical exam   Complete AV block: Visit.  During catheterization in the setting of a left bundle branch block his beta-blockers were stopped he is now in sinus rhythm we will continue to hold.   Insulin-dependent diabetes mellitus type 2: Hold metformin, continue  sliding scale blood glucose is fairly controlled on current regimen will continue.  Essential hypertension: Fairly controlled continue losartan.  Hyperlipidemia: Excellent continue statins.  Hyperkalemia: Now resolved.   Discharge Instructions  Discharge Instructions     Diet - low sodium heart healthy   Complete by: As directed    Increase activity slowly   Complete by: As directed       Allergies as of 06/25/2021       Reactions   Gabapentin Other (See Comments)   Affects breathing   Pravastatin Sodium Other (See Comments)   Muscle aches    Rosuvastatin Calcium    Muscle Aches    Simvastatin    Muscle aches  Other reaction(s): Muscle pain   Amoxicillin Rash   Lisinopril Rash   Other reaction(s): rash   Sulfacetamide Rash   Tetracyclines & Related Rash        Medication List     TAKE these medications    acetaminophen 500 MG tablet Commonly known as: TYLENOL Take 1,000 mg by mouth 2 (two) times daily as needed for mild pain or headache.   albuterol 108 (90 Base) MCG/ACT inhaler Commonly known as: VENTOLIN HFA Inhale 1-2 puffs into the lungs every 6 (six) hours as needed for wheezing or shortness of breath. What changed: Another medication with the same name was removed. Continue taking this medication, and follow the directions you see here.   arformoterol 15 MCG/2ML Nebu Commonly known as: BROVANA USE 1 VIAL  IN  NEBULIZER TWICE  DAILY - morning and evening   aspirin EC 81 MG tablet Take 81 mg by mouth daily. Swallow whole.   brimonidine 0.2 % ophthalmic solution Commonly known as: ALPHAGAN Place 1 drop into the left eye 2 (  two) times daily.   budesonide 0.5 MG/2ML nebulizer solution Commonly known as: PULMICORT USE 1 VIAL  IN  NEBULIZER TWICE  DAILY - Rinse mouth after treatment   Calcium Carbonate-Vitamin D 600-200 MG-UNIT Tabs Take 1 tablet by mouth every evening.   carboxymethylcellulose 0.5 % Soln Commonly known as: REFRESH PLUS Place  1 drop into both eyes 3 (three) times daily as needed (dry eyes).   docusate sodium 100 MG capsule Commonly known as: COLACE Take 100 mg by mouth daily.   estradiol 0.1 MG/GM vaginal cream Commonly known as: ESTRACE Place 1 Applicatorful vaginally every other day.   EYE VITAMINS PO Take 1 tablet by mouth daily.   ezetimibe 10 MG tablet Commonly known as: ZETIA Take 1 tablet (10 mg total) by mouth daily. What changed: when to take this   furosemide 20 MG tablet Commonly known as: LASIX Take 1 tablet (20 mg total) by mouth daily. Take extra 1 tablet by mouth daily as needed for edema. What changed:  when to take this reasons to take this additional instructions   ipratropium 0.02 % nebulizer solution Commonly known as: ATROVENT Take 2.5 mLs (0.5 mg total) by nebulization 4 (four) times daily.   losartan 50 MG tablet Commonly known as: COZAAR Take 1 tablet (50 mg total) by mouth daily.   metFORMIN 500 MG tablet Commonly known as: GLUCOPHAGE Take 1 tablet (500 mg total) by mouth daily with supper.   predniSONE 10 MG tablet Commonly known as: DELTASONE Take 4 tablets (40 mg total) by mouth daily with breakfast for 5 days. Take 1 tablet (10 mg total) by mouth daily with breakfast.        Allergies  Allergen Reactions   Gabapentin Other (See Comments)    Affects breathing   Pravastatin Sodium Other (See Comments)    Muscle aches    Rosuvastatin Calcium     Muscle Aches    Simvastatin     Muscle aches  Other reaction(s): Muscle pain   Amoxicillin Rash   Lisinopril Rash    Other reaction(s): rash   Sulfacetamide Rash   Tetracyclines & Related Rash    Consultations: Pulmonary   Procedures/Studies: DG Chest 2 View  Result Date: 06/23/2021 CLINICAL DATA:  Hemoptysis EXAM: CHEST - 2 VIEW COMPARISON:  06/19/2021 FINDINGS: Left lower lobe infiltrate unchanged. Mild right lower lobe airspace disease unchanged Apical emphysema.  No pneumothorax or heart failure.  IMPRESSION: Bibasilar airspace disease left greater than right unchanged. Electronically Signed   By: Marlan Palau M.D.   On: 06/23/2021 09:48   CT Angio Chest PE W and/or Wo Contrast  Result Date: 06/19/2021 CLINICAL DATA:  Hemoptysis since recent cardiac catheterization and pacemaker removal last week. Chest soreness. EXAM: CT ANGIOGRAPHY CHEST WITH CONTRAST TECHNIQUE: Multidetector CT imaging of the chest was performed using the standard protocol during bolus administration of intravenous contrast. Multiplanar CT image reconstructions and MIPs were obtained to evaluate the vascular anatomy. CONTRAST:  79mL OMNIPAQUE IOHEXOL 350 MG/ML SOLN COMPARISON:  Plain film of earlier in the day. CTA chest 06/11/2021 FINDINGS: Cardiovascular: The quality of this exam for evaluation of pulmonary embolism is good. Mild motion degradation. No evidence of pulmonary embolism. Apparent vascular filling defect on 331/7 in the right lower lobe is secondary to fluid within the endobronchial tree and is similar to on the prior. Aortic atherosclerosis. Tortuous thoracic aorta. Normal heart size, without pericardial effusion. Three vessel coronary artery calcification. Mediastinum/Nodes: No mediastinal or hilar adenopathy. Lungs/Pleura: No pleural fluid.  Advanced bullous emphysema. Bibasilar dependent airspace and less so ground-glass opacity, new since the prior CT. Upper Abdomen: Normal imaged portions of the liver, spleen, stomach pancreas, adrenal glands. Upper pole left renal 1.2 cm incompletely imaged low-density lesion is likely a cyst. Musculoskeletal: No acute osseous abnormality. Review of the MIP images confirms the above findings. IMPRESSION: 1. No evidence of pulmonary embolism. Mild motion degradation. 2. New bibasilar airspace and less so ground-glass opacity, favored to represent aspiration or infection. 3. Aortic Atherosclerosis (ICD10-I70.0) and Emphysema (ICD10-J43.9). Coronary artery atherosclerosis.  Electronically Signed   By: Jeronimo Greaves M.D.   On: 06/19/2021 16:13   CT Angio Chest Pulmonary Embolism (PE) W or WO Contrast  Result Date: 06/11/2021 CLINICAL DATA:  Shortness of breath. Elevated D-dimer. Previous tobacco abuse. EXAM: CT ANGIOGRAPHY CHEST WITH CONTRAST TECHNIQUE: Multidetector CT imaging of the chest was performed using the standard protocol during bolus administration of intravenous contrast. Multiplanar CT image reconstructions and MIPs were obtained to evaluate the vascular anatomy. CONTRAST:  68mL OMNIPAQUE IOHEXOL 350 MG/ML SOLN COMPARISON:  01/12/2020 FINDINGS: Cardiovascular: Heart size normal. No pericardial effusion. The RV is nondilated. Satisfactory opacification of pulmonary arteries noted, and there is no evidence of pulmonary emboli. Scattered coronary calcifications. Adequate contrast opacification of the thoracic aorta with no evidence of dissection, aneurysm, or stenosis. There is classic 3-vessel brachiocephalic arch anatomy without proximal stenosis. Scattered calcified plaque in the arch and descending thoracic aorta as well as in the proximal visualized nondilated abdominal aorta. Mediastinum/Nodes: No mediastinal hematoma, mass or adenopathy. Small hiatal hernia. Lungs/Pleura: No pleural effusion. No pneumothorax. Pulmonary emphysema most marked in the apices. Fluid/Debris in segmental subsegmental airways in the posterior right lower lobe. No nodule or airspace infiltrate. Upper Abdomen: No acute findings. Stable bilateral low-attenuation renal lesions largest 3.1 cm fluid attenuation mid left lesion possibly cysts. Musculoskeletal: No chest wall abnormality. No acute or significant osseous findings. Review of the MIP images confirms the above findings. IMPRESSION: 1. Negative for acute PE or thoracic aortic dissection. 2. Coronary and aortic Atherosclerosis (ICD10-I70.0) 3. Emphysema (ICD10-J43.9). Electronically Signed   By: Corlis Leak M.D.   On: 06/11/2021 11:04    CARDIAC CATHETERIZATION  Result Date: 06/14/2021   Dist LAD lesion is 30% stenosed.   Mid RCA lesion is 40% stenosed.   LV end diastolic pressure is normal.   There is no aortic valve stenosis. Nonobstructive CAD Normal LV filling pressures with EDP 15 mm Hg Normal RA pressure. Mildly elevated RV pressure 48/5 mm Hg. Unable to measure PA pressure due to development of complete heart block. 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 Plan: will admit to unit for continued monitoring. Continue transvenous pacing. Hold beta blocker.   DG Chest Portable 1 View  Result Date: 06/19/2021 CLINICAL DATA:  Hemoptysis x Friday, pt mentioned having a cath done on 12/1 Tech wore surgical mask/gloves, pt had on maskhemoptysis EXAM: PORTABLE CHEST 1 VIEW COMPARISON:  None. FINDINGS: Normal mediastinum and cardiac silhouette. Normal pulmonary vasculature. No evidence of effusion, infiltrate, or pneumothorax. No acute bony abnormality. IMPRESSION: No acute cardiopulmonary process. Electronically Signed   By: Genevive Bi M.D.   On: 06/19/2021 14:22   DG CHEST PORT 1 VIEW  Result Date: 06/14/2021 CLINICAL DATA:  Shortness of breath. EXAM: PORTABLE CHEST 1 VIEW COMPARISON:  Chest x-ray 01/12/2020. FINDINGS: The aorta is ectatic. Cardiac silhouette is within normal limits. There hazy, patchy and interstitial opacities in the bilateral lower lobes, left greater  than right. There is no pleural effusion or pneumothorax. No acute fractures are seen. IMPRESSION: 1. Bibasilar infiltrates, left greater than right. Findings may be related to infection and or edema. Electronically Signed   By: Darliss Cheney M.D.   On: 06/14/2021 19:08   (Echo, Carotid, EGD, Colonoscopy, ERCP)    Subjective: Breathing she relates is back to baseline.  Discharge Exam: Vitals:   06/25/21 0810 06/25/21 0831  BP:  (!) 133/93  Pulse:  88  Resp:  17  Temp:  99.1 F (37.3 C)  SpO2: 99% 90%   Vitals:    06/24/21 2029 06/25/21 0450 06/25/21 0810 06/25/21 0831  BP: 140/72 (!) 128/52  (!) 133/93  Pulse: 86 100  88  Resp: 20 20  17   Temp: 98.5 F (36.9 C) 98.6 F (37 C)  99.1 F (37.3 C)  TempSrc: Oral   Oral  SpO2: 98% 99% 99% 90%  Weight:      Height:        General: Pt is alert, awake, not in acute distress Cardiovascular: RRR, S1/S2 +, no rubs, no gallops Respiratory: CTA bilaterally, no wheezing, no rhonchi Abdominal: Soft, NT, ND, bowel sounds + Extremities: no edema, no cyanosis    The results of significant diagnostics from this hospitalization (including imaging, microbiology, ancillary and laboratory) are listed below for reference.     Microbiology: Recent Results (from the past 240 hour(s))  Resp Panel by RT-PCR (Flu A&B, Covid) Nasopharyngeal Swab     Status: None   Collection Time: 06/19/21  4:29 PM   Specimen: Nasopharyngeal Swab; Nasopharyngeal(NP) swabs in vial transport medium  Result Value Ref Range Status   SARS Coronavirus 2 by RT PCR NEGATIVE NEGATIVE Final    Comment: (NOTE) SARS-CoV-2 target nucleic acids are NOT DETECTED.  The SARS-CoV-2 RNA is generally detectable in upper respiratory specimens during the acute phase of infection. The lowest concentration of SARS-CoV-2 viral copies this assay can detect is 138 copies/mL. A negative result does not preclude SARS-Cov-2 infection and should not be used as the sole basis for treatment or other patient management decisions. A negative result may occur with  improper specimen collection/handling, submission of specimen other than nasopharyngeal swab, presence of viral mutation(s) within the areas targeted by this assay, and inadequate number of viral copies(<138 copies/mL). A negative result must be combined with clinical observations, patient history, and epidemiological information. The expected result is Negative.  Fact Sheet for Patients:  14/06/22  Fact Sheet  for Healthcare Providers:  BloggerCourse.com  This test is no t yet approved or cleared by the [place] FDA and  has been authorized for detection and/or diagnosis of SARS-CoV-2 by FDA under an Emergency Use Authorization (EUA). This EUA will remain  in effect (meaning this test can be used) for the duration of the COVID-19 declaration under Section 564(b)(1) of the Act, 21 U.S.C.section 360bbb-3(b)(1), unless the authorization is terminated  or revoked sooner.       Influenza A by PCR NEGATIVE NEGATIVE Final   Influenza B by PCR NEGATIVE NEGATIVE Final    Comment: (NOTE) The Xpert Xpress SARS-CoV-2/FLU/RSV plus assay is intended as an aid in the diagnosis of influenza from Nasopharyngeal swab specimens and should not be used as a sole basis for treatment. Nasal washings and aspirates are unacceptable for Xpert Xpress SARS-CoV-2/FLU/RSV testing.  Fact Sheet for Patients: [place]  Fact Sheet for Healthcare Providers: BloggerCourse.com  This test is not yet approved or cleared by the [place] FDA  and has been authorized for detection and/or diagnosis of SARS-CoV-2 by FDA under an Emergency Use Authorization (EUA). This EUA will remain in effect (meaning this test can be used) for the duration of the COVID-19 declaration under Section 564(b)(1) of the Act, 21 U.S.C. section 360bbb-3(b)(1), unless the authorization is terminated or revoked.  Performed at [facility], [address] [place], [place] [address]   Blood culture (routine x 2)     Status: None (Preliminary result)   Collection Time: 06/19/21  4:40 PM   Specimen: BLOOD  Result Value Ref Range Status   Specimen Description BLOOD LEFT ANTECUBITAL  Final   Special Requests   Final    BOTTLES DRAWN AEROBIC AND ANAEROBIC Blood Culture adequate volume   Culture  Setup Time   Final    GRAM POSITIVE RODS AEROBIC BOTTLE  ONLY CRITICAL RESULT CALLED TO, READ BACK BY AND VERIFIED WITH: MEYER,[email or web address]  06/24/21 MK Performed at [facility], [address] [place], [place] [address]    Culture   Final    GRAM POSITIVE RODS CORRECTED ON 12/12 AT 6213: PREVIOUSLY REPORTED AS NO GROWTH 5 DAYS   Report Status PENDING  Incomplete  Blood culture (routine x 2)     Status: None   Collection Time: 06/19/21  5:04 PM   Specimen: BLOOD LEFT HAND  Result Value Ref Range Status   Specimen Description BLOOD LEFT HAND  Final   Special Requests   Final    BOTTLES DRAWN AEROBIC AND ANAEROBIC Blood Culture results may not be optimal due to an inadequate volume of blood received in culture bottles   Culture   Final    NO GROWTH 5 DAYS Performed at [facility], [address] [place], [place] [address]    Report Status 06/24/2021 FINAL  Final     Labs: BNP (last 3 results) Recent Labs    06/19/21 1415 06/23/21 1204  BNP 232.6* 50.3   Basic Metabolic Panel: Recent Labs  Lab 06/19/21 1419 06/19/21 1505 06/22/21 0121 06/23/21 0145 06/23/21 1204 06/23/21 1523 06/25/21 0258  NA  --    < > 135 133* 134* 133* 132*  K  --    < > 3.2* 4.2 5.4* 5.0 3.8  CL  --    < > 91* 94* 93* 92* 89*  CO2  --    < > 34* 32 36* 31 35*  GLUCOSE  --    < > 98 104* 160* 125* 100*  BUN  --    < > CREATININE  --    < > 0.66 0.72 0.76 0.68 0.68  CALCIUM  --    < > 8.3* 7.9* 8.4* 8.6* 8.6*  MG 1.8  --   --   --   --   --   --    < > = values in this interval not displayed.   Liver Function Tests: Recent Labs  Lab 06/19/21 1415 06/23/21 1204  AST 34 27  ALT 30 30  ALKPHOS 50 41  BILITOT 0.9 0.7  PROT 6.7 5.4*  ALBUMIN 3.8 3.0*   No results for input(s): LIPASE, AMYLASE in the last 168 hours. No results for input(s): AMMONIA in the last 168 hours. CBC: Recent Labs  Lab 06/19/21 1415 06/19/21 1505 06/21/21 0410 06/22/21 0121 06/23/21 0145 06/23/21 1204 06/25/21 0258  WBC 11.4*   < > 9.8  9.2 10.5 12.0* 9.0  NEUTROABS 10.3*  --   --   --   --   --   --  HGB 11.5*   < > 10.4* 10.8* 9.6* 9.9* 9.5*  HCT 35.1*   < > 32.7* 33.2* 30.6* 31.9* 29.6*  MCV 98.0   < > 100.0 100.3* 102.7* 103.9* 102.4*  PLT 278   < > 263 242 237 234 230   < > = values in this interval not displayed.   Cardiac Enzymes: No results for input(s): CKTOTAL, CKMB, CKMBINDEX, TROPONINI in the last 168 hours. BNP: Invalid input(s): POCBNP CBG: Recent Labs  Lab 06/24/21 0749 06/24/21 1206 06/24/21 1550 06/24/21 2028 06/25/21 0834  GLUCAP 106* 110* 131* 127* 137*   D-Dimer No results for input(s): DDIMER in the last 72 hours. Hgb A1c No results for input(s): HGBA1C in the last 72 hours. Lipid Profile No results for input(s): CHOL, HDL, LDLCALC, TRIG, CHOLHDL, LDLDIRECT in the last 72 hours. Thyroid function studies No results for input(s): TSH, T4TOTAL, T3FREE, THYROIDAB in the last 72 hours.  Invalid input(s): FREET3 Anemia work up No results for input(s): VITAMINB12, FOLATE, FERRITIN, TIBC, IRON, RETICCTPCT in the last 72 hours. Urinalysis    Component Value Date/Time   COLORURINE YELLOW 01/12/2020 1256   APPEARANCEUR HAZY (A) 01/12/2020 1256   LABSPEC 1.008 01/12/2020 1256   PHURINE 6.0 01/12/2020 1256   GLUCOSEU NEGATIVE 01/12/2020 1256   HGBUR SMALL (A) 01/12/2020 1256   BILIRUBINUR NEGATIVE 01/12/2020 1256   KETONESUR 5 (A) 01/12/2020 1256   PROTEINUR NEGATIVE 01/12/2020 1256   NITRITE NEGATIVE 01/12/2020 1256   LEUKOCYTESUR NEGATIVE 01/12/2020 1256   Sepsis Labs Invalid input(s): PROCALCITONIN,  WBC,  LACTICIDVEN Microbiology Recent Results (from the past 240 hour(s))  Resp Panel by RT-PCR (Flu A&B, Covid) Nasopharyngeal Swab     Status: None   Collection Time: 06/19/21  4:29 PM   Specimen: Nasopharyngeal Swab; Nasopharyngeal(NP) swabs in vial transport medium  Result Value Ref Range Status   SARS Coronavirus 2 by RT PCR NEGATIVE NEGATIVE Final    Comment: (NOTE) SARS-CoV-2  target nucleic acids are NOT DETECTED.  The SARS-CoV-2 RNA is generally detectable in upper respiratory specimens during the acute phase of infection. The lowest concentration of SARS-CoV-2 viral copies this assay can detect is 138 copies/mL. A negative result does not preclude SARS-Cov-2 infection and should not be used as the sole basis for treatment or other patient management decisions. A negative result may occur with  improper specimen collection/handling, submission of specimen other than nasopharyngeal swab, presence of viral mutation(s) within the areas targeted by this assay, and inadequate number of viral copies(<138 copies/mL). A negative result must be combined with clinical observations, patient history, and epidemiological information. The expected result is Negative.  Fact Sheet for Patients:  BloggerCourse.com  Fact Sheet for Healthcare Providers:  [employer]  This test is no t yet approved or cleared by the [place] FDA and  has been authorized for detection and/or diagnosis of SARS-CoV-2 by FDA under an Emergency Use Authorization (EUA). This EUA will remain  in effect (meaning this test can be used) for the duration of the COVID-19 declaration under Section 564(b)(1) of the Act, 21 U.S.C.section 360bbb-3(b)(1), unless the authorization is terminated  or revoked sooner.       Influenza A by PCR NEGATIVE NEGATIVE Final   Influenza B by PCR NEGATIVE NEGATIVE Final    Comment: (NOTE) The Xpert Xpress SARS-CoV-2/FLU/RSV plus assay is intended as an aid in the diagnosis of influenza from Nasopharyngeal swab specimens and should not be used as a sole basis for treatment. Nasal washings and aspirates are  unacceptable for Xpert Xpress SARS-CoV-2/FLU/RSV testing.  Fact Sheet for Patients: BloggerCourse.com  Fact Sheet for Healthcare  Providers: [employer]ious[employer]ker.it  This test is not yet approved or cleared by the [place] FDA and has been authorized for detection and/or diagnosis of SARS-CoV-2 by FDA under an Emergency Use Authorization (EUA). This EUA will remain in effect (meaning this test can be used) for the duration of the COVID-19 declaration under Section 564(b)(1) of the Act, 21 U.S.C. section 360bbb-3(b)(1), unless the authorization is terminated or revoked.  Performed at [facility], [address] [place], [place] [address]   Blood culture (routine x 2)     Status: None (Preliminary result)   Collection Time: 06/19/21  4:40 PM   Specimen: BLOOD  Result Value Ref Range Status   Specimen Description BLOOD LEFT ANTECUBITAL  Final   Special Requests   Final    BOTTLES DRAWN AEROBIC AND ANAEROBIC Blood Culture adequate volume   Culture  Setup Time   Final    GRAM POSITIVE RODS AEROBIC BOTTLE ONLY CRITICAL RESULT CALLED TO, READ BACK BY AND VERIFIED WITH: [email or web address]  06/24/21 MK Performed at [facility], [address], [place], [place] [address]    Culture   Final    GRAM POSITIVE RODS CORRECTED ON 12/12 AT 0981: PREVIOUSLY REPORTED AS NO GROWTH 5 DAYS   Report Status PENDING  Incomplete  Blood culture (routine x 2)     Status: None   Collection Time: 06/19/21  5:04 PM   Specimen: BLOOD LEFT HAND  Result Value Ref Range Status   Specimen Description BLOOD LEFT HAND  Final   Special Requests   Final    BOTTLES DRAWN AEROBIC AND ANAEROBIC Blood Culture results may not be optimal due to an inadequate volume of blood received in culture bottles   Culture   Final    NO GROWTH 5 DAYS Performed at [facility], [address], [place], [place] [address]    Report Status 06/24/2021 FINAL  Final     SIGNED:   Marinda Elk, MD  [facility] 06/25/2021, 10:40 AM Pager   If 7PM-7AM, please contact  night-coverage [email or web address] Password [email or web address]

## 2021-06-25 NOTE — TOC Progression Note (Addendum)
Transition of Care [facility]) - Progression Note    Patient Details  Name: Rebecca [name] MRN: [id number] Date of Birth: 11-Mar-1941  Transition of Care [facility]) CM/SW Contact  Lorri Frederick, LCSW Phone Number: 06/25/2021, 11:19 AM  Clinical Narrative:   CSW spoke with [facility] and they do not manage this [employer] policy for SNF authorization.   CSW spoke with Velna Hatchet at [facility] and she will review referral.    1330: [facility] does not have beds.  1345: CSW spoke with pt and daughter, discussed bed offers.  They chose [facility].  CSW spoke with [employer] at [facility] and she will initiate auth.    Expected Discharge Plan: Skilled Nursing Facility Barriers to Discharge: Continued Medical Work up, SNF Pending bed offer  Expected Discharge Plan and Services Expected Discharge Plan: Skilled Nursing Facility     Post Acute Care Choice: Skilled Nursing Facility Living arrangements for the past 2 months: Independent Living Facility ([facility], [address], [place]) Expected Discharge Date: 06/25/21                                     Social Determinants of Health (SDOH) Interventions    Readmission Risk Interventions No flowsheet data found.

## 2021-06-25 NOTE — Progress Notes (Signed)
Mobility Specialist Progress Note   06/25/21 1741  Mobility  Activity Sat and stood x 3 (x5*)  Level of Assistance Contact guard assist, steadying assist  Assistive Device Front wheel walker  Mobility  (STS)  Mobility Response Tolerated well  Mobility performed by Mobility specialist  $Mobility charge 1 Mobility   Pt needing no physical assistance to stand just minor cues on safe hand placement. X2 standing rest breaks, x1 seated rest break d/t SOB. Pursed lip breathing was practiced to recover, O2 sats remained >91% SpO2 entire session. Left in chair w/ call bell by side and all needs met.   Holland Falling Mobility Specialist Phone Number [phone]

## 2021-06-25 NOTE — Progress Notes (Signed)
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 Workup for respiratory failure suspect due to COPD and PNA. 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   OT comments  Pt is slowly progressing towards OT goals. During session, pt completed LB dressing with Min A. May benefit from use of sock aid in future sessions. Pt also completed toilet transfer with Min guard. VSS stable on 4L O2, however pt reporting significant fatigue throughout, requiring frequent rest breaks. Continue to recommend SNF. Will continue to follow.   Recommendations for follow up therapy are one component of a multi-disciplinary discharge planning process, led by the attending physician.  Recommendations may be updated based on patient status, additional functional criteria and insurance authorization.    Follow Up Recommendations  Skilled nursing-short term rehab (<3 hours/day)    Assistance Recommended at Discharge Intermittent Supervision/Assistance  Equipment Recommendations  Other (comment) (Defer)    Recommendations for Other Services      Precautions / Restrictions Precautions Precautions: Fall Restrictions Weight Bearing Restrictions: No       Mobility Bed Mobility               General bed mobility comments: In recliner    Transfers Overall transfer level: Needs assistance Equipment used: Rolling walker (2 wheels) Transfers: Sit to/from Stand Sit to Stand: Min guard                 Balance Overall balance assessment: Needs assistance;History of Falls Sitting-balance support: No  upper extremity supported;Feet supported Sitting balance-Leahy Scale: Good     Standing balance support: During functional activity;Reliant on assistive device for balance Standing balance-Leahy Scale: Fair Standing balance comment: Able to let go of RW with both hands to pull pants over hips in standing                           ADL either performed or assessed with clinical judgement   ADL Overall ADL's : Needs assistance/impaired                     Lower Body Dressing: Minimal assistance;Sit to/from stand Lower Body Dressing Details (indicate cue type and reason): Assist to don socks. Able to don/doff pants with setup and min guard when standing to pull over hips. Requiring extended time for rest breaks throughout. Toilet Transfer: Min guard;Ambulation;BSC/3in1;Rolling walker (2 wheels) Toilet Transfer Details (indicate cue type and reason): Ambulated approx. 6-8 ft from recliner to [facility]. Reported that this was very fatiguing         Functional mobility during ADLs: Min guard;Rolling walker (2 wheels)      Extremity/Trunk Assessment              Vision       Perception     Praxis      Cognition Arousal/Alertness: Awake/alert Behavior During Therapy: WFL for tasks assessed/performed Overall Cognitive Status: Within Functional Limits for tasks assessed  Exercises     Shoulder Instructions       General Comments      Pertinent Vitals/ Pain       Pain Assessment: No/denies pain  Home Living                                          Prior Functioning/Environment              Frequency  Min 2X/week        Progress Toward Goals  OT Goals(current goals can now be found in the care plan section)  Progress towards OT goals: Progressing toward goals  Acute Rehab OT Goals Patient Stated Goal: increase endurance OT Goal Formulation: With patient Time  For Goal Achievement: 07/06/21 Potential to Achieve Goals: Good ADL Goals Pt Will Perform Upper Body Dressing: with supervision;sitting Pt Will Perform Lower Body Dressing: with supervision;sit to/from stand Pt Will Transfer to Toilet: with supervision;ambulating;regular height toilet  Plan Discharge plan remains appropriate;Frequency remains appropriate    Co-evaluation                 AM-PAC OT "6 Clicks" Daily Activity     Outcome Measure   Help from another person eating meals?: None Help from another person taking care of personal grooming?: A Little Help from another person toileting, which includes using toliet, bedpan, or urinal?: A Little Help from another person bathing (including washing, rinsing, drying)?: A Lot Help from another person to put on and taking off regular upper body clothing?: A Little Help from another person to put on and taking off regular lower body clothing?: A Little 6 Click Score: 18    End of Session Equipment Utilized During Treatment: Gait belt;Rolling walker (2 wheels);Oxygen  OT Visit Diagnosis: Unsteadiness on feet (R26.81);Repeated falls (R29.6);Other abnormalities of gait and mobility (R26.89);Muscle weakness (generalized) (M62.81)   Activity Tolerance Patient limited by fatigue   Patient Left in chair;with call bell/phone within reach;with nursing/sitter in room   Nurse Communication Mobility status        Time: 8466-5993 OT Time Calculation (min): 27 min  Charges: OT General Charges $OT Visit: 1 Visit OT Treatments $Self Care/Home Management : 8-22 mins $Therapeutic Activity: 8-22 mins  Rebecca [name], OT/L  Acute Rehab [phone]  Rebecca [name] 06/25/2021, 5:21 PM

## 2021-06-25 NOTE — Plan of Care (Signed)

## 2021-06-26 DIAGNOSIS — J9621 Acute and chronic respiratory failure with hypoxia: Secondary | ICD-10-CM | POA: Diagnosis not present

## 2021-06-26 DIAGNOSIS — Z7189 Other specified counseling: Secondary | ICD-10-CM | POA: Diagnosis not present

## 2021-06-26 DIAGNOSIS — M6281 Muscle weakness (generalized): Secondary | ICD-10-CM | POA: Diagnosis not present

## 2021-06-26 DIAGNOSIS — R2681 Unsteadiness on feet: Secondary | ICD-10-CM | POA: Diagnosis not present

## 2021-06-26 DIAGNOSIS — E785 Hyperlipidemia, unspecified: Secondary | ICD-10-CM | POA: Diagnosis not present

## 2021-06-26 DIAGNOSIS — I2510 Atherosclerotic heart disease of native coronary artery without angina pectoris: Secondary | ICD-10-CM | POA: Diagnosis not present

## 2021-06-26 DIAGNOSIS — I10 Essential (primary) hypertension: Secondary | ICD-10-CM | POA: Diagnosis not present

## 2021-06-26 DIAGNOSIS — R0902 Hypoxemia: Secondary | ICD-10-CM | POA: Diagnosis not present

## 2021-06-26 DIAGNOSIS — I442 Atrioventricular block, complete: Secondary | ICD-10-CM | POA: Diagnosis not present

## 2021-06-26 DIAGNOSIS — E1169 Type 2 diabetes mellitus with other specified complication: Secondary | ICD-10-CM | POA: Diagnosis not present

## 2021-06-26 DIAGNOSIS — J9600 Acute respiratory failure, unspecified whether with hypoxia or hypercapnia: Secondary | ICD-10-CM | POA: Diagnosis not present

## 2021-06-26 DIAGNOSIS — J441 Chronic obstructive pulmonary disease with (acute) exacerbation: Secondary | ICD-10-CM | POA: Diagnosis not present

## 2021-06-26 DIAGNOSIS — J690 Pneumonitis due to inhalation of food and vomit: Secondary | ICD-10-CM | POA: Diagnosis not present

## 2021-06-26 DIAGNOSIS — I5022 Chronic systolic (congestive) heart failure: Secondary | ICD-10-CM | POA: Diagnosis not present

## 2021-06-26 DIAGNOSIS — R531 Weakness: Secondary | ICD-10-CM | POA: Diagnosis not present

## 2021-06-26 DIAGNOSIS — R000 Tachycardia, unspecified: Secondary | ICD-10-CM | POA: Diagnosis not present

## 2021-06-26 DIAGNOSIS — E440 Moderate protein-calorie malnutrition: Secondary | ICD-10-CM | POA: Diagnosis not present

## 2021-06-26 DIAGNOSIS — J9611 Chronic respiratory failure with hypoxia: Secondary | ICD-10-CM | POA: Diagnosis not present

## 2021-06-26 DIAGNOSIS — J9601 Acute respiratory failure with hypoxia: Secondary | ICD-10-CM | POA: Diagnosis not present

## 2021-06-26 DIAGNOSIS — R1312 Dysphagia, oropharyngeal phase: Secondary | ICD-10-CM | POA: Diagnosis not present

## 2021-06-26 DIAGNOSIS — D539 Nutritional anemia, unspecified: Secondary | ICD-10-CM | POA: Diagnosis not present

## 2021-06-26 DIAGNOSIS — R042 Hemoptysis: Secondary | ICD-10-CM | POA: Diagnosis not present

## 2021-06-26 DIAGNOSIS — J189 Pneumonia, unspecified organism: Secondary | ICD-10-CM | POA: Diagnosis not present

## 2021-06-26 DIAGNOSIS — E119 Type 2 diabetes mellitus without complications: Secondary | ICD-10-CM | POA: Diagnosis not present

## 2021-06-26 DIAGNOSIS — Z515 Encounter for palliative care: Secondary | ICD-10-CM | POA: Diagnosis not present

## 2021-06-26 DIAGNOSIS — R5381 Other malaise: Secondary | ICD-10-CM | POA: Diagnosis not present

## 2021-06-26 DIAGNOSIS — Z743 Need for continuous supervision: Secondary | ICD-10-CM | POA: Diagnosis not present

## 2021-06-26 DIAGNOSIS — J449 Chronic obstructive pulmonary disease, unspecified: Secondary | ICD-10-CM | POA: Diagnosis not present

## 2021-06-26 LAB — GLUCOSE, CAPILLARY
Glucose-Capillary: 108 mg/dL — ABNORMAL HIGH (ref 70–99)
Glucose-Capillary: 189 mg/dL — ABNORMAL HIGH (ref 70–99)

## 2021-06-26 NOTE — TOC Transition Note (Signed)
Transition of Care [facility]) - CM/SW Discharge Note   Patient Details  Name: Rebecca [name] MRN: [id number] Date of Birth: 06-Mar-1941  Transition of Care [facility]) CM/SW Contact:  Lorri Frederick, LCSW Phone Number: 06/26/2021, 1:28 PM   Clinical Narrative:   Pt discharging to [facility].  RN call [phone] for report.  [facility] to transport between 2-230pm.      Final next level of care: Skilled Nursing Facility Barriers to Discharge: Barriers Resolved   Patient Goals and CMS Choice Patient states their goals for this hospitalization and ongoing recovery are:: get well CMS Medicare.gov Compare Post Acute Care list provided to:: Patient Represenative (must comment) Choice offered to / list presented to : Adult Children  Discharge Placement              Patient chooses bed at:  [facility]) Patient to be transferred to facility by: [facility] Name of family member notified: daughter [name] Patient and family notified of of transfer: 06/26/21  Discharge Plan and Services     Post Acute Care Choice: Skilled Nursing Facility                               Social Determinants of Health (SDOH) Interventions     Readmission Risk Interventions No flowsheet data found.

## 2021-06-26 NOTE — Progress Notes (Signed)
Discharge summary packet/pertinent documents provided to [facility]. Pt d/c to [facility] as tolerated. Pt remains alert/oriented in no apparent distress.

## 2021-06-26 NOTE — Progress Notes (Signed)
Report given to [facility], staff nurse at [facility], all questions and concerns were fully answered.

## 2021-06-26 NOTE — Care Management Important Message (Signed)
Important Message  Patient Details  Name: Rebecca [name] MRN: [id number] Date of Birth: Mar 28, 1941   Medicare Important Message Given:  Yes     Sherilyn Banker 06/26/2021, 1:58 PM

## 2021-06-26 NOTE — Progress Notes (Signed)
Messaged floor RN to determine if patient could remain without a PIV until closer to her next dose of IV abx. Patient is receiving IV abx Q24 hours; next dose isn't due until midnight. [place], RN responded that it would be ok for patient to remain without. Instructed her to place new consult when patient is due for next dose.   Latanza Pfefferkorn [place] Mast, RN

## 2021-06-27 ENCOUNTER — Encounter: Payer: Self-pay | Admitting: Adult Health

## 2021-06-27 ENCOUNTER — Non-Acute Institutional Stay (SKILLED_NURSING_FACILITY): Payer: Medicare HMO | Admitting: Adult Health

## 2021-06-27 ENCOUNTER — Telehealth: Payer: Self-pay | Admitting: Adult Health

## 2021-06-27 DIAGNOSIS — I442 Atrioventricular block, complete: Secondary | ICD-10-CM | POA: Diagnosis not present

## 2021-06-27 DIAGNOSIS — J9621 Acute and chronic respiratory failure with hypoxia: Secondary | ICD-10-CM | POA: Diagnosis not present

## 2021-06-27 DIAGNOSIS — I5022 Chronic systolic (congestive) heart failure: Secondary | ICD-10-CM | POA: Diagnosis not present

## 2021-06-27 DIAGNOSIS — R042 Hemoptysis: Secondary | ICD-10-CM

## 2021-06-27 DIAGNOSIS — J449 Chronic obstructive pulmonary disease, unspecified: Secondary | ICD-10-CM | POA: Diagnosis not present

## 2021-06-27 DIAGNOSIS — E1169 Type 2 diabetes mellitus with other specified complication: Secondary | ICD-10-CM | POA: Diagnosis not present

## 2021-06-27 DIAGNOSIS — I10 Essential (primary) hypertension: Secondary | ICD-10-CM

## 2021-06-27 NOTE — Progress Notes (Signed)
Location:  [facility] Room Number: [id number] a Place of Service:  SNF (31) Provider:  Durenda Age, DNP, FNP-BC  Patient Care Team: London Pepper, MD as PCP - General (Family Medicine) Freada Bergeron, MD as PCP - Cardiology (Cardiology)  Extended Emergency Contact Information Primary Emergency Contact: Swing,Carol Home Phone: [phone] Mobile Phone: [phone] Relation: Daughter Secondary Emergency Contact: [place], [place] [address] Johnnette Litter of [place] Phone: [phone] Mobile Phone: [phone] Relation: Daughter  Code Status: FULL CODE   Goals of care: Advanced Directive information Advanced Directives 07/06/2021  Does Patient Have a Medical Advance Directive? No  Type of Advance Directive -  Does patient want to make changes to medical advance directive? No - Patient declined  Copy of [place] in Chart? -  Would patient like information on creating a medical advance directive? -     Chief Complaint  Patient presents with   Hospitalization Follow-up    Hospitalization follow up    HPI:  Pt is a 80 y.o. female who was admitted to [facility] on 06/26/32 post hospital admission 06/19/21 to 06/26/21. 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  She was recently discharged from the hospital on 06/15/2021 for right and left-sided cardiac catheterization.  She was coughing up small amount of blood clot about the partner indecisiveness 3-4 times a day, mostly in the mornings.  CT angiogram negative for PE but showed groundglass opacities.  She completed 5-day course of antibiotics.  She was seen in the room with daughter at bedside. Resident showed  her sputum with blood clot. She denies shortness of breath. Daughter was at bedside. Patient is a resident of  [facility].  Past Medical History:  Diagnosis Date   Congestive heart disease ([place])    COPD (chronic obstructive pulmonary disease) ([place])    Diabetes ([place]) with atherosclerosis    Hyperlipemia    Osteoporosis    Primary hypertension    Past Surgical History:  Procedure Laterality Date   RIGHT/LEFT HEART CATH AND CORONARY ANGIOGRAPHY N/A 06/14/2021   Procedure: RIGHT/LEFT HEART CATH AND CORONARY ANGIOGRAPHY;  Surgeon: [name], [name], MD;  Location: [facility];  Service: Cardiovascular;  Laterality: N/A;   TEMPORARY PACEMAKER N/A 06/14/2021   Procedure: TEMPORARY PACEMAKER;  Surgeon: [name], [name], MD;  Location: [facility];  Service: Cardiovascular;  Laterality: N/A;    Allergies  Allergen Reactions   Gabapentin Other (See Comments)    Affects breathing   Pravastatin Sodium Other (See Comments)    Muscle aches    Rosuvastatin Calcium     Muscle Aches    Simvastatin     Muscle aches  Other reaction(s): Muscle pain   Amoxicillin Rash   Lisinopril Rash    Other reaction(s): rash   Sulfacetamide Rash   Tetracyclines & Related Rash    Outpatient Encounter Medications as of 06/27/2021  Medication Sig   acetaminophen (TYLENOL) 500 MG tablet Take 1,000 mg by mouth 2 (two) times daily as needed for mild pain or headache.   albuterol (VENTOLIN HFA) 108 (90 Base) MCG/ACT inhaler Inhale 1-2 puffs into the lungs every 6 (six) hours as needed for wheezing or shortness of breath. (Patient taking differently: Inhale 2 puffs into the lungs every 6 (six)  hours as needed for wheezing or shortness of breath.)   arformoterol (BROVANA) 15 MCG/2ML NEBU USE 1 VIAL  IN  NEBULIZER TWICE  DAILY - morning and evening   aspirin EC 81 MG tablet Take 81 mg by mouth daily. Swallow whole.   bisacodyl (DULCOLAX) 10 MG suppository If not relieved by MOM, give 10 mg Bisacodyl suppositiory rectally X 1 dose in 24 hours as needed   brimonidine (ALPHAGAN) 0.2 %  ophthalmic solution Place 1 drop into the left eye 2 (two) times daily.   budesonide (PULMICORT) 0.25 MG/2ML nebulizer solution Take 0.25 mg by nebulization 2 (two) times daily. 1 VIAL   Calcium Carbonate-Vitamin D 600-200 MG-UNIT TABS Take 1 tablet by mouth every evening.   carboxymethylcellulose (REFRESH PLUS) 0.5 % SOLN Place 1 drop into both eyes 3 (three) times daily as needed (dry eyes).   docusate sodium (COLACE) 100 MG capsule Take 100 mg by mouth daily.   estradiol (ESTRACE) 0.1 MG/GM vaginal cream Place 1 Applicatorful vaginally every other day.    ezetimibe (ZETIA) 10 MG tablet Take 1 tablet (10 mg total) by mouth daily.   furosemide (LASIX) 20 MG tablet Take 1 tablet (20 mg total) by mouth daily. Take extra 1 tablet by mouth daily as needed for edema.   ipratropium (ATROVENT) 0.02 % nebulizer solution Take 2.5 mLs (0.5 mg total) by nebulization 4 (four) times daily.   losartan (COZAAR) 50 MG tablet Take 1 tablet (50 mg total) by mouth daily.   magnesium hydroxide (MILK OF MAGNESIA) 400 MG/5ML suspension If no BM in 3 days, give 30 cc Milk of Magnesium p.o. x 1 dose in 24 hours as needed   metFORMIN (GLUCOPHAGE) 500 MG tablet Take 1 tablet (500 mg total) by mouth daily with supper.   Multiple Vitamins-Minerals (EYE VITAMINS PO) Take 1 tablet by mouth daily.   Sodium Phosphates (RA SALINE ENEMA RE) If not relieved by Biscodyl suppository, give disposable Saline Enema rectally X 1 dose/24 hrs as needed. Contact MD as needed if no results from enema   [DISCONTINUED] predniSONE (DELTASONE) 10 MG tablet Take 4 tablets (40 mg total) by mouth daily with breakfast for 5 days. Take 1 tablet (10 mg total) by mouth daily with breakfast. (Patient not taking: Reported on 07/03/2021)   [DISCONTINUED] budesonide (PULMICORT) 0.5 MG/2ML nebulizer solution USE 1 VIAL  IN  NEBULIZER TWICE  DAILY - Rinse mouth after treatment   No facility-administered encounter medications on file as of 06/27/2021.     Review of Systems  GENERAL: No change in appetite, no fatigue, no weight changes, no fever or chills  MOUTH and THROAT: Denies oral discomfort, gingival pain or bleeding RESPIRATORY: no cough, SOB, DOE, wheezing, +hemoptysis CARDIAC: No chest pain, edema or palpitations GI: No abdominal pain, diarrhea, constipation, heart burn, nausea or vomiting GU: Denies dysuria, frequency, hematuria, incontinence, or discharge NEUROLOGICAL: Denies dizziness, syncope, numbness, or headache PSYCHIATRIC: Denies feelings of depression or anxiety. No report of hallucinations, insomnia, paranoia, or agitation   Immunization History  Administered Date(s) Administered   Fluad Quad(high Dose 65+) 04/07/2020   Influenza Split 04/14/2018, 03/16/2019, 04/07/2019   Influenza, High Dose Seasonal PF 04/14/2018, 05/01/2019   Influenza-Unspecified 04/27/2021   Moderna SARS-COV2 Booster Vaccination 02/10/2021   Moderna Sars-Covid-2 Vaccination 08/16/2019, 09/13/2019, 05/22/2020   Pneumococcal Conjugate-13 11/15/2016, 02/05/2018   Tdap 07/27/2018   Zoster Recombinat (Shingrix) 07/14/2018, 09/14/2018   Zoster, Live 07/14/2018, 09/14/2018   Pertinent  Health Maintenance Due  Topic Date Due  INFLUENZA VACCINE  Completed   DEXA SCAN  Completed   Fall Risk 06/24/2021 06/24/2021 06/25/2021 06/25/2021 06/26/2021  Falls in the past year? - - - - -  Patient Fall Risk Level High fall risk Moderate fall risk Moderate fall risk Moderate fall risk Moderate fall risk  Patient at Risk for Falls Due to - - - - -     Vitals:   06/27/21 0920  BP: (!) 134/54  Pulse: 66  Resp: 18  Temp: (!) 97.4 F (36.3 C)  Weight: 153 lb 6.4 oz (69.6 kg)  Height: 5\' 2"  (1.575 m)   Body mass index is 28.06 kg/m.  Physical Exam  GENERAL APPEARANCE: Well nourished. In no acute distress. Normal body habitus SKIN:  Skin is warm and dry.  MOUTH and THROAT: Lips are without lesions. Oral mucosa is moist and without lesions.   RESPIRATORY: Breathing is even & unlabored, BS CTAB. O2 @ 5L/min via [place] CARDIAC: RRR, no murmur,no extra heart sounds, no edema GI: Abdomen soft, normal BS, no masses, no tenderness NEUROLOGICAL: There is no tremor. Speech is clear. Alert and oriented X 3. PSYCHIATRIC:  Affect and behavior are appropriate  Labs reviewed: Recent Labs    06/15/21 0237 06/19/21 1415 06/19/21 1419 06/19/21 1505 06/23/21 1204 06/23/21 1523 06/25/21 0258 07/04/21 0000  NA 139   < >  --    < > 134* 133* 132* 140  K 4.2   < >  --    < > 5.4* 5.0 3.8 4.5  CL 93*   < >  --    < > 93* 92* 89* 93*  CO2 39*   < >  --    < > 36* 31 35*  --   GLUCOSE 122*   < >  --    < > 160* 125* 100*  --   BUN 17   < >  --    < > 21 22 13 16   CREATININE 0.86   < >  --    < > 0.76 0.68 0.68 0.7  CALCIUM 8.9   < >  --    < > 8.4* 8.6* 8.6* 9.2  MG 1.7  --  1.8  --   --   --   --   --    < > = values in this interval not displayed.   Recent Labs    06/19/21 1415 06/23/21 1204  AST 34 27  ALT 30 30  ALKPHOS 50 41  BILITOT 0.9 0.7  PROT 6.7 5.4*  ALBUMIN 3.8 3.0*   Recent Labs    06/05/21 1054 06/14/21 1437 06/15/21 0237 06/19/21 1415 06/19/21 1505 06/23/21 0145 06/23/21 1204 06/25/21 0258 07/04/21 0000  WBC 9.3  --  11.8* 11.4*   < > 10.5 12.0* 9.0 6.2  NEUTROABS 7.8*  --  9.9* 10.3*  --   --   --   --   --   HGB 11.1*   < > 10.2* 11.5*   < > 9.6* 9.9* 9.5* 9.5*  HCT 34.2*   < > 33.1* 35.1*   < > 30.6* 31.9* 29.6* 29*  MCV 98.2  --  104.7* 98.0   < > 102.7* 103.9* 102.4*  --   PLT 226.0  --  213 278   < > 237 234 230 234   < > = values in this interval not displayed.   Lab Results  Component Value Date   TSH 0.661 01/13/2020   No  results found for: HGBA1C No results found for: CHOL, HDL, LDLCALC, LDLDIRECT, TRIG, CHOLHDL  Significant Diagnostic Results in last 30 days:  DG Chest 2 View  Result Date: 06/23/2021 CLINICAL DATA:  Hemoptysis EXAM: CHEST - 2 VIEW COMPARISON:  06/19/2021 FINDINGS: Left  lower lobe infiltrate unchanged. Mild right lower lobe airspace disease unchanged Apical emphysema.  No pneumothorax or heart failure. IMPRESSION: Bibasilar airspace disease left greater than right unchanged. Electronically Signed   By: Marlan Palau M.D.   On: 06/23/2021 09:48   CT Angio Chest PE W and/or Wo Contrast  Result Date: 06/19/2021 CLINICAL DATA:  Hemoptysis since recent cardiac catheterization and pacemaker removal last week. Chest soreness. EXAM: CT ANGIOGRAPHY CHEST WITH CONTRAST TECHNIQUE: Multidetector CT imaging of the chest was performed using the standard protocol during bolus administration of intravenous contrast. Multiplanar CT image reconstructions and MIPs were obtained to evaluate the vascular anatomy. CONTRAST:  41mL OMNIPAQUE IOHEXOL 350 MG/ML SOLN COMPARISON:  Plain film of earlier in the day. CTA chest 06/11/2021 FINDINGS: Cardiovascular: The quality of this exam for evaluation of pulmonary embolism is good. Mild motion degradation. No evidence of pulmonary embolism. Apparent vascular filling defect on 331/7 in the right lower lobe is secondary to fluid within the endobronchial tree and is similar to on the prior. Aortic atherosclerosis. Tortuous thoracic aorta. Normal heart size, without pericardial effusion. Three vessel coronary artery calcification. Mediastinum/Nodes: No mediastinal or hilar adenopathy. Lungs/Pleura: No pleural fluid.  Advanced bullous emphysema. Bibasilar dependent airspace and less so ground-glass opacity, new since the prior CT. Upper Abdomen: Normal imaged portions of the liver, spleen, stomach pancreas, adrenal glands. Upper pole left renal 1.2 cm incompletely imaged low-density lesion is likely a cyst. Musculoskeletal: No acute osseous abnormality. Review of the MIP images confirms the above findings. IMPRESSION: 1. No evidence of pulmonary embolism. Mild motion degradation. 2. New bibasilar airspace and less so ground-glass opacity, favored to represent  aspiration or infection. 3. Aortic Atherosclerosis (ICD10-I70.0) and Emphysema (ICD10-J43.9). Coronary artery atherosclerosis. Electronically Signed   By: Jeronimo Greaves M.D.   On: 06/19/2021 16:13   CT Angio Chest Pulmonary Embolism (PE) W or WO Contrast  Result Date: 06/11/2021 CLINICAL DATA:  Shortness of breath. Elevated D-dimer. Previous tobacco abuse. EXAM: CT ANGIOGRAPHY CHEST WITH CONTRAST TECHNIQUE: Multidetector CT imaging of the chest was performed using the standard protocol during bolus administration of intravenous contrast. Multiplanar CT image reconstructions and MIPs were obtained to evaluate the vascular anatomy. CONTRAST:  91mL OMNIPAQUE IOHEXOL 350 MG/ML SOLN COMPARISON:  01/12/2020 FINDINGS: Cardiovascular: Heart size normal. No pericardial effusion. The RV is nondilated. Satisfactory opacification of pulmonary arteries noted, and there is no evidence of pulmonary emboli. Scattered coronary calcifications. Adequate contrast opacification of the thoracic aorta with no evidence of dissection, aneurysm, or stenosis. There is classic 3-vessel brachiocephalic arch anatomy without proximal stenosis. Scattered calcified plaque in the arch and descending thoracic aorta as well as in the proximal visualized nondilated abdominal aorta. Mediastinum/Nodes: No mediastinal hematoma, mass or adenopathy. Small hiatal hernia. Lungs/Pleura: No pleural effusion. No pneumothorax. Pulmonary emphysema most marked in the apices. Fluid/Debris in segmental subsegmental airways in the posterior right lower lobe. No nodule or airspace infiltrate. Upper Abdomen: No acute findings. Stable bilateral low-attenuation renal lesions largest 3.1 cm fluid attenuation mid left lesion possibly cysts. Musculoskeletal: No chest wall abnormality. No acute or significant osseous findings. Review of the MIP images confirms the above findings. IMPRESSION: 1. Negative for acute PE or thoracic aortic dissection. 2. Coronary and aortic  Atherosclerosis (ICD10-I70.0) 3. Emphysema (ICD10-J43.9). Electronically Signed   By: Lucrezia Europe M.D.   On: 06/11/2021 11:04   CARDIAC CATHETERIZATION  Result Date: 06/14/2021   Dist LAD lesion is 30% stenosed.   Mid RCA lesion is 40% stenosed.   LV end diastolic pressure is normal.   There is no aortic valve stenosis. Nonobstructive CAD Normal LV filling pressures with EDP 15 mm Hg Normal RA pressure. Mildly elevated RV pressure 48/5 mm Hg. Unable to measure PA pressure due to development of complete heart block. 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 Plan: will admit to unit for continued monitoring. Continue transvenous pacing. Hold beta blocker.   DG Chest Portable 1 View  Result Date: 06/19/2021 CLINICAL DATA:  Hemoptysis x Friday, pt mentioned having a cath done on 12/1 Tech wore surgical mask/gloves, pt had on maskhemoptysis EXAM: PORTABLE CHEST 1 VIEW COMPARISON:  None. FINDINGS: Normal mediastinum and cardiac silhouette. Normal pulmonary vasculature. No evidence of effusion, infiltrate, or pneumothorax. No acute bony abnormality. IMPRESSION: No acute cardiopulmonary process. Electronically Signed   By: Suzy Bouchard M.D.   On: 06/19/2021 14:22   DG CHEST PORT 1 VIEW  Result Date: 06/14/2021 CLINICAL DATA:  Shortness of breath. EXAM: PORTABLE CHEST 1 VIEW COMPARISON:  Chest x-ray 01/12/2020. FINDINGS: The aorta is ectatic. Cardiac silhouette is within normal limits. There hazy, patchy and interstitial opacities in the bilateral lower lobes, left greater than right. There is no pleural effusion or pneumothorax. No acute fractures are seen. IMPRESSION: 1. Bibasilar infiltrates, left greater than right. Findings may be related to infection and or edema. Electronically Signed   By: Ronney Asters M.D.   On: 06/14/2021 19:08    Assessment/Plan  1. Acute on chronic respiratory failure with hypoxia (HCC) -  secondary to aspiration pneumonia -   Completed  5-day course of antibiotics -   Continue prednisone 10 mg 1 tab daily x5 days then Prednisone 5 mg daily X 5 days  2. Hemoptysis -   CT angio of the chest showed groundglass opacities and was treated with antibiotics x5 days -   continue Prednisone as above  3. Chronic systolic heart failure (HCC) -  no SOB, continue Lasix 20 mg 1 tab daily  4. COPD with chronic bronchitis and emphysema (HCC) -  no wheezing, stable -Continue arformoterol 15 mcg / 2 mL solution 1 vial via nebulizer twice a day  5. Type 2 diabetes mellitus with other specified complication, without long-term current use of insulin (HCC) -   Continue metformin 500 mg 1 tab PO with supper  6. Heart block AV complete (HCC) -   Coreg was discontinued due to having complete heart block secondary to catheter induced blood in setting of LBBB  7. Essential hypertension -  BPs stable, continue losartan 50 mg 1 tab daily   Family/ staff Communication: Discussed plan of care with resident, daughter and charge nurse.  Labs/tests ordered: CBC and BMP on 07/03/2021  Goals of care:   Short-term care/Palliative care   Durenda Age, DNP, MSN, FNP-BC [facility] [phone] (Monday-Friday 8:00 a.m. - 5:00 p.m.) [phone] (after hours)

## 2021-06-27 NOTE — Telephone Encounter (Signed)
See MyChart message from 12/14.

## 2021-06-28 ENCOUNTER — Encounter: Payer: Self-pay | Admitting: Internal Medicine

## 2021-06-28 ENCOUNTER — Non-Acute Institutional Stay (SKILLED_NURSING_FACILITY): Payer: Medicare HMO | Admitting: Internal Medicine

## 2021-06-28 DIAGNOSIS — E440 Moderate protein-calorie malnutrition: Secondary | ICD-10-CM

## 2021-06-28 DIAGNOSIS — D539 Nutritional anemia, unspecified: Secondary | ICD-10-CM | POA: Diagnosis not present

## 2021-06-28 DIAGNOSIS — E46 Unspecified protein-calorie malnutrition: Secondary | ICD-10-CM | POA: Insufficient documentation

## 2021-06-28 DIAGNOSIS — J9601 Acute respiratory failure with hypoxia: Secondary | ICD-10-CM | POA: Diagnosis not present

## 2021-06-28 DIAGNOSIS — I2510 Atherosclerotic heart disease of native coronary artery without angina pectoris: Secondary | ICD-10-CM | POA: Diagnosis not present

## 2021-06-28 LAB — CULTURE, BLOOD (ROUTINE X 2): Special Requests: ADEQUATE

## 2021-06-28 NOTE — Assessment & Plan Note (Signed)
Current total protein 5.4 and albumin 3.0.  Nutrition consult at SNF.

## 2021-06-28 NOTE — Assessment & Plan Note (Addendum)
12/6 - 06/26/2021 admitted with multiple day hemoptysis.  Initial H/H 10.6/33.7 with normochromic, normocytic indices.  [place] H/H 9.5/29.6 with macrocytosis, MCV 102.4. Minimal hemoptysis reported here at [facility]; continue to monitor.

## 2021-06-28 NOTE — Patient Instructions (Signed)
See assessment and plan under each diagnosis in the problem list and acutely for this visit 

## 2021-06-28 NOTE — Assessment & Plan Note (Addendum)
12/1 - 06/15/2021 elective cardiac catheterization to evaluate exertional dyspnea.  30% distal LAD and 40% mid RCA lesion present, both nonobstructive.  Catheterization complicated by complete heart block in the context of pre-existing LBBB.  Temporary pacer placed.  Interventions necessitated prolonged supine positioning possibly predisposing to aspiration pneumonia.  See 12/6 - 06/26/2021 hospitalization notes.

## 2021-06-28 NOTE — Progress Notes (Signed)
NURSING HOME LOCATION:  [facility] ROOM NUMBER:  [id number]  CODE STATUS:  Full Code  PCP:  Farris Has MD  This is a comprehensive admission note to this SNFperformed on this date less than 30 days from date of admission. Included are preadmission medical/surgical history; reconciled medication list; family history; social history and comprehensive review of systems.  Corrections and additions to the records were documented. Comprehensive physical exam was also performed. Additionally a clinical summary was entered for each active diagnosis pertinent to this admission in the Problem List to enhance continuity of care.  HPI: The patient actually had been hospitalized twice prior to discharge to this SNF. On 12/1 - 06/15/2021 she was admitted for cardiac catheterization to evaluate exertional dyspnea.  Cath revealed distal LAD 30% lesion and a mid RCA 40% lesion which were nonobstructive.  The catheterization was complicated by complete heart block in the context of pre-existing left bundle branch block.  Temporary pacer had to be placed. She was discharged but returned 12/6 with hemoptysis over the 4-5 days prior to the second admission in the context of chronic hypoxic and hypercarbic respiratory failure & O2 dependent COPD. She presented with progressive dyspnea unresponsive to steroids. Pulmonary consulted; CT angio of the chest was negative for PTE but did show some new bibasilar groundglass opacities.  It was questioned whether she had aspirated while she was in the supine position for the complicated cardiac catheterization.  Steroids were weaned to a dose of 5 mg of prednisone daily. Significant labs included normochromic, normocytic anemia with H/H of 10.6/32.7.  Subsequently H/H was 9.5/29.6 with macrocytosis with an MCV of 102.4.  Mild hyponatremia was present with a value of 132.  High-grade stage II CKD was present with a creatinine of 0.68 and GFR of 88.  Protein  caloric malnutrition was suggested by total protein of 5.4 and albumin of 3.0. She was discharged to the SNF for rehab with PT/OT.  Past medical and surgical history: Includes history of congestive heart failure, dyslipidemia, osteoporosis, essential hypertension, and diabetes with aortic atherosclerosis.  Social history: drinks socially; 60 pack year hx of smoking  Family history: noncontributory due to age   Review of systems: She is an extremely good historian.  She states that she has been on oxygen for 12 years.  She describes the reason for admission as "congestive heart failure complicated by a viral infection affecting the heart".  She has intermittent nonproductive cough.  She has chronic dyspnea with even minimal exertion such as dressing.   She describes double vision which precludes reading.   She has chronic constipation and uses MiraLAX and Colace.   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   She describes some anxiety related to the most recent hospitalization stating "all of this is new".  Constitutional: No fever, significant weight change  Eyes: No redness, discharge, pain ENT/mouth: No nasal congestion, purulent discharge, earache, change in hearing, sore throat  Cardiovascular: No chest pain, palpitations, paroxysmal nocturnal dyspnea, claudication, edema  Respiratory: No significant snoring, apnea  Gastrointestinal: No heartburn, dysphagia, abdominal pain, nausea /vomiting, rectal bleeding, melena Genitourinary: No dysuria, hematuria, pyuria, incontinence, nocturia Dermatologic: No rash, pruritus, change in appearance of skin Neurologic: No dizziness, headache, syncope, seizures Psychiatric: No significant insomnia, anorexia Endocrine: No change in hair/skin/nails, excessive thirst, excessive hunger, excessive urination  Hematologic/lymphatic: No significant bruising, lymphadenopathy, abnormal bleeding Allergy/immunology: No  itchy/watery eyes, significant sneezing, urticaria, angioedema  Physical exam:  Pertinent or positive findings: Bilateral ptosis is present.  She is on nasal oxygen.  She has complete dentures.  Breath sounds are decreased.  Heart sounds are markedly distant and the rhythm is slightly irregular.  There may be some respiratory variation to the heart rhythm.  Abdomen is protuberant.  Pedal pulses are decreased.  She has 1/2+ edema at the sock line.  General appearance: Adequately nourished; no acute distress, increased work of breathing is present.   Lymphatic: No lymphadenopathy about the head, neck, axilla. Eyes: No conjunctival inflammation or lid edema is present. There is no scleral icterus. Ears:  External ear exam shows no significant lesions or deformities.   Nose:  External nasal examination shows no deformity or inflammation. Nasal mucosa are pink and moist without lesions, exudates Neck:  No thyromegaly, masses, tenderness noted.    Heart:  No gallop, murmur, click, rub.  Lungs: without wheezes, rhonchi, rales, rubs. Abdomen: Bowel sounds are normal.  Abdomen is soft and nontender with no organomegaly, hernias, masses. GU: Deferred  Extremities:  No cyanosis, clubbing. Neurologic exam:  Balance, Rhomberg, finger to nose testing could not be completed due to clinical state Skin: Warm & dry w/o tenting. No significant lesions or rash.  See clinical summary under each active problem in the Problem List with associated updated therapeutic plan

## 2021-06-29 NOTE — Assessment & Plan Note (Signed)
O2 sats adequate on present regimen . Pulmonary OP F/U pending

## 2021-07-03 ENCOUNTER — Ambulatory Visit: Payer: Medicare HMO | Admitting: Adult Health

## 2021-07-03 ENCOUNTER — Telehealth: Payer: Self-pay | Admitting: *Deleted

## 2021-07-03 ENCOUNTER — Encounter: Payer: Self-pay | Admitting: Adult Health

## 2021-07-03 ENCOUNTER — Other Ambulatory Visit: Payer: Self-pay

## 2021-07-03 ENCOUNTER — Non-Acute Institutional Stay: Payer: Medicare HMO | Admitting: Hospice

## 2021-07-03 ENCOUNTER — Ambulatory Visit: Payer: Medicare HMO

## 2021-07-03 VITALS — BP 130/68 | HR 107 | Temp 98.5°F | Ht 62.0 in | Wt 157.2 lb

## 2021-07-03 DIAGNOSIS — J690 Pneumonitis due to inhalation of food and vomit: Secondary | ICD-10-CM | POA: Diagnosis not present

## 2021-07-03 DIAGNOSIS — J9601 Acute respiratory failure with hypoxia: Secondary | ICD-10-CM

## 2021-07-03 DIAGNOSIS — R5381 Other malaise: Secondary | ICD-10-CM

## 2021-07-03 DIAGNOSIS — J441 Chronic obstructive pulmonary disease with (acute) exacerbation: Secondary | ICD-10-CM

## 2021-07-03 DIAGNOSIS — J9611 Chronic respiratory failure with hypoxia: Secondary | ICD-10-CM | POA: Diagnosis not present

## 2021-07-03 DIAGNOSIS — E119 Type 2 diabetes mellitus without complications: Secondary | ICD-10-CM | POA: Diagnosis not present

## 2021-07-03 DIAGNOSIS — Z7189 Other specified counseling: Secondary | ICD-10-CM

## 2021-07-03 DIAGNOSIS — R531 Weakness: Secondary | ICD-10-CM | POA: Diagnosis not present

## 2021-07-03 DIAGNOSIS — Z515 Encounter for palliative care: Secondary | ICD-10-CM | POA: Diagnosis not present

## 2021-07-03 DIAGNOSIS — J449 Chronic obstructive pulmonary disease, unspecified: Secondary | ICD-10-CM | POA: Diagnosis not present

## 2021-07-03 NOTE — Assessment & Plan Note (Signed)
Recent acute on chronic hypoxic respiratory failure with probable aspiration pneumonia and COPD flare.  Now improved and back to baseline.  Patient continue on oxygen 4 L at rest and 5 L with activity.

## 2021-07-03 NOTE — Assessment & Plan Note (Signed)
Continue with physical therapy and Occupational Therapy at rehab.  Would recommend home PT if covered.

## 2021-07-03 NOTE — Assessment & Plan Note (Signed)
Recent exacerbation now resolved.  Patient is no longer on prednisone.  And seems to be doing well.  We will continue on aggressive maintenance regimen with triple therapy budesonide, Brovana, ipratropium.  Plan  Patient Instructions  Continue on Budesonide Neb Twice daily  .  Continue on Brovana Twice daily  .  Continue on Ipratropium Neb four times a day   Activity as tolerated Saline nasal spray Twice daily   Saline nasal gel At bedtime   Robitussin Cough syrup As needed  cough/congestion  Palliative care as discussed. Continue on oxygen 4l/m rest and 5l/m activity . O2 sats goal >88-90%.  Follow-up with Dr. Vassie Loll or Pennye Beeghly NP in 4-6 weeks  and As needed  Please contact office for sooner follow up if symptoms do not improve or worsen or seek emergency care

## 2021-07-03 NOTE — Assessment & Plan Note (Signed)
Aspiration pneumonia suspected.  Patient is encouraged on aspiration precautions.  She has completed her course of antibiotics clinically is much improved.  Plan  Patient Instructions  Continue on Budesonide Neb Twice daily  .  Continue on Brovana Twice daily  .  Continue on Ipratropium Neb four times a day   Activity as tolerated Saline nasal spray Twice daily   Saline nasal gel At bedtime   Robitussin Cough syrup As needed  cough/congestion  Palliative care as discussed. Continue on oxygen 4l/m rest and 5l/m activity . O2 sats goal >88-90%.  Follow-up with Dr. Vassie Loll or Rebecca Woolen NP in 4-6 weeks  and As needed  Please contact office for sooner follow up if symptoms do not improve or worsen or seek emergency care

## 2021-07-03 NOTE — Telephone Encounter (Signed)
[facility] to get full list of medications for patient, the line just rang and no one ever answered [phone]).

## 2021-07-03 NOTE — Assessment & Plan Note (Signed)
Palliative care consult is pending for today.

## 2021-07-03 NOTE — Progress Notes (Signed)
[facility] Consult Note Telephone: [phone]  Fax: [phone]  PATIENT NAME: Rebecca [name] [address] [place] [address] [phone] (home)  DOB: 1940-09-13 MRN: [id number]  PRIMARY CARE PROVIDER:    London Pepper, MD,  [place] [address] [address] [phone]  REFERRING PROVIDER:   Dr Gean Birchwood  RESPONSIBLE PARTY:   Self/Carol Contact Information     Name Relation Home Work Mobile   Swing,Carol Daughter [phone]  [phone]   [name],Jan Daughter [phone]  [phone]        I met face to face with patient at facility. Palliative Care was asked to follow this patient by consultation request of  Dr Gean Birchwood to address advance care planning, complex medical decision making and goals of care clarification. NP called [name] and updated her on visit. Patient and [name] endorsed palliative care.This is the initial visit.    ASSESSMENT AND / RECOMMENDATIONS:   Advance Care Planning: Our advance care planning conversation included a discussion about:    The value and importance of advance care planning  Difference between Hospice and Palliative care Exploration of goals of care in the event of a sudden injury or illness  Identification and preparation of a healthcare agent  Review and updating or creation of an  advance directive document .  CODE STATUS: Patient affirmed she is a full code.  Goals of Care: Goals include to maximize quality of life and symptom management  I spent 16 minutes providing this initial consultation. More than 50% of the time in this consultation was spent on counseling patient and coordinating communication. --------------------------------------------------------------------------------------------------------------------------------------  Symptom Management/Plan: Acute respiratory failure with hypoxia: Continue oxygen  supplementation as ordered.  Breathing treatments on hand as needed for respiratory distress. Weakness: PT OT is ongoing for strengthening.  PT reports patient ambulated 100 feet today with her rolling walker/portable oxygen.  Balance of rest and performance activity. COPD: Oxygen and steroid dependent.  Continue prednisone 5 mg daily as ordered along with breathing treatments. Diabetes Mellitus: Managed with Metformin. Check CBG and A1c per protocol.  Follow up: Palliative care will continue to follow for complex medical decision making, advance care planning, and clarification of goals. Return 6 weeks or prn. Encouraged to call provider sooner with any concerns.   Family /Caregiver/Community Supports: Patient in SNF for ongoing rehab  HOSPICE ELIGIBILITY/DIAGNOSIS: TBD  Chief Complaint: Initial Palliative care visit  HISTORY OF PRESENT ILLNESS:  Rebecca [name] is a 80 y.o. year old female  with multiple medical conditions including acute respiratory failure with hypoxia following hemoptysis possibly secondary to aspiration pneumonia for which patient was hospitalized 12/6 - 06/26/2021.  Patient completed 5-day course of antibiotics, discharged to SNF for acute rehab.  She endorses shortness of breath on exertion and this impairs her independence.  Ongoing breathing treatments and oxygen supplementation are helpful.  She is no longer coughing out bright red sputum.  She denies pain/discomfort. History obtained from review of EMR, discussion with primary team, caregiver, family and/or Ms. Mechling.  Review and summarization of Epic records shows history from other than patient. Rest of 10 point ROS asked and negative.  I reviewed as needed, available labs, patient records, imaging, studies and related documents from the EMR.  ROS General: NAD EYES: denies vision changes ENMT: denies dysphagia Cardiovascular: denies chest pain/discomfort Pulmonary: denies cough, endorses SOB on moderate  exertion Abdomen: endorses good appetite, denies constipation/diarrhea GU: denies dysuria, urinary  frequency MSK:  endorses weakness,  no falls reported Skin: denies rashes or wounds Neurological: denies pain, denies insomnia Psych: Endorses positive mood Heme/lymph/immuno: denies bruises, abnormal bleeding  Physical Exam: Constitutional: NAD General: Well groomed, cooperative EYES: anicteric sclera, lids intact, no discharge  ENMT: Moist mucous membrane CV: S1 S2, RRR, no LE edema Pulmonary: LCTA, no increased work of breathing, no cough, oxygen supplementation 5 L/min Abdomen: active BS + 4 quadrants, soft and non tender GU: no suprapubic tenderness MSK: weakness, ambulatory with rolling walker Skin: warm and dry, no rashes or wounds on visible skin Neuro:  weakness, otherwise non focal Psych: non-anxious affect Hem/lymph/immuno: no widespread bruising   PAST MEDICAL HISTORY:  Active Ambulatory Problems    Diagnosis Date Noted   Chronic respiratory failure with hypoxia ([place] 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   CAD (coronary artery disease) 06/18/2021   Essential hypertension 06/18/2021   Hyperlipidemia 06/18/2021   Left bundle branch block 06/18/2021   Hemoptysis 06/19/2021   Hematochezia 06/19/2021   HCAP (healthcare-associated pneumonia)    Macrocytic anemia 06/28/2021   Unspecified protein-calorie malnutrition ([facility]) 06/28/2021   Aspiration  pneumonia ([facility]) 07/03/2021   Resolved Ambulatory Problems    Diagnosis Date Noted   No Resolved Ambulatory Problems   Past Medical History:  Diagnosis Date   Congestive heart disease ([facility])    COPD (chronic obstructive pulmonary disease) ([facility])    Diabetes ([facility]) with atherosclerosis    Hyperlipemia    Osteoporosis    Primary hypertension     SOCIAL HX:  Social History   Tobacco Use   Smoking status: Former    Packs/day: 1.50    Years: 40.00    Pack years: 60.00    Types: Cigarettes    Quit date: 2003    Years since quitting: 19.9   Smokeless tobacco: Never  Substance Use Topics   Alcohol use: Yes    Alcohol/week: 1.0 standard drink    Types: 1 Glasses of wine per week     FAMILY HX:  Mother: heart disease Father: Diabetes Mellitus   ALLERGIES:  Allergies  Allergen Reactions   Gabapentin Other (See Comments)    Affects breathing   Pravastatin Sodium Other (See Comments)    Muscle aches    Rosuvastatin Calcium     Muscle Aches    Simvastatin     Muscle aches  Other reaction(s): Muscle pain   Amoxicillin Rash   Lisinopril Rash    Other reaction(s): rash   Sulfacetamide Rash   Tetracyclines & Related Rash      PERTINENT MEDICATIONS:  Outpatient Encounter Medications as of 07/03/2021  Medication Sig   acetaminophen (TYLENOL) 500 MG tablet Take 1,000 mg by mouth 2 (two) times daily as needed for mild pain or headache.   albuterol (VENTOLIN HFA) 108 (90 Base) MCG/ACT inhaler Inhale 1-2 puffs into the lungs every 6 (six) hours as needed for wheezing or shortness of breath. (Patient taking differently: Inhale 2 puffs into the lungs every 6 (six)  hours as needed for wheezing or shortness of breath.)   arformoterol (BROVANA) 15 MCG/2ML NEBU USE 1 VIAL  IN  NEBULIZER TWICE  DAILY - morning and evening   aspirin EC 81 MG tablet Take 81 mg by mouth daily. Swallow whole.   bisacodyl (DULCOLAX) 10 MG suppository If not relieved by MOM, give 10 mg Bisacodyl suppositiory  rectally X 1 dose in 24 hours as needed   brimonidine (ALPHAGAN) 0.2 % ophthalmic solution Place 1 drop into the left eye 2 (two) times daily.   budesonide (PULMICORT) 0.25 MG/2ML nebulizer solution Take 0.25 mg by nebulization 2 (two) times daily. 1 VIAL   Calcium Carbonate-Vitamin D 600-200 MG-UNIT TABS Take 1 tablet by mouth every evening.   carboxymethylcellulose (REFRESH PLUS) 0.5 % SOLN Place 1 drop into both eyes 3 (three) times daily as needed (dry eyes).   docusate sodium (COLACE) 100 MG capsule Take 100 mg by mouth daily.   estradiol (ESTRACE) 0.1 MG/GM vaginal cream Place 1 Applicatorful vaginally every other day.    ezetimibe (ZETIA) 10 MG tablet Take 1 tablet (10 mg total) by mouth daily. (Patient taking differently: Take 10 mg by mouth at bedtime.)   furosemide (LASIX) 20 MG tablet Take 1 tablet (20 mg total) by mouth daily. Take extra 1 tablet by mouth daily as needed for edema. (Patient taking differently: Take 20 mg by mouth daily as needed for fluid or edema.)   ipratropium (ATROVENT) 0.02 % nebulizer solution Take 2.5 mLs (0.5 mg total) by nebulization 4 (four) times daily.   losartan (COZAAR) 50 MG tablet Take 1 tablet (50 mg total) by mouth daily.   magnesium hydroxide (MILK OF MAGNESIA) 400 MG/5ML suspension If no BM in 3 days, give 30 cc Milk of Magnesium p.o. x 1 dose in 24 hours as needed   metFORMIN (GLUCOPHAGE) 500 MG tablet Take 1 tablet (500 mg total) by mouth daily with supper.   Multiple Vitamins-Minerals (EYE VITAMINS PO) Take 1 tablet by mouth daily.   predniSONE (DELTASONE) 10 MG tablet Take 4 tablets (40 mg total) by mouth daily with breakfast for 5 days. Take 1 tablet (10 mg total) by mouth daily with breakfast. (Patient not taking: Reported on 07/03/2021)   Sodium Phosphates (RA SALINE ENEMA RE) If not relieved by Biscodyl suppository, give disposable Saline Enema rectally X 1 dose/24 hrs as needed. Contact MD as needed if no results from enema   No  facility-administered encounter medications on file as of 07/03/2021.     Thank you for the opportunity to participate in the care of Ms. [place].  The palliative care team will continue to follow. Please call our office at [phone] if we can be of additional assistance.   Note: Portions of this note were generated with [employer]. Dictation errors may occur despite best attempts at proofreading.  Teodoro Spray, NP

## 2021-07-03 NOTE — Progress Notes (Signed)
@Patient  ID: Rebecca [name], female    DOB: January 15, 1941, 80 y.o.   MRN: [id number]  Chief Complaint  Patient presents with   Follow-up    Referring provider: London Pepper, MD  HPI: 80 year old female former smoker followed for severe COPD and chronic respiratory failure on oxygen since 2010 and lung nodules (stable on serial CT) Medical history significant for nonischemic cardiomyopathy, diabetes and hypertension Lives in independent living at [place]  TEST/EVENTS :  Spirometry 01/2018 showed severe airway obstruction with ratio 43, FEV1 of 0.57-31% and FVC of 53%.   CT angio 06/18/18 LUL nodule 85mm   CT chest on February 16, 2019 that showed a stable 1.2 cm left thyroid nodule.  Severe emphysema.  Irregular 6 mm left upper lobe pulmonary nodule-stable and stable right upper lobe 5 mm pulmonary nodule.    Overnight oximetry test September 2021 showed no desaturations on 5 L of oxygen.   CT chest 09/2019 -No CT evidence of pulmonary embolism. 2. Marked severity emphysematous lung disease.(no mention of lung nodules )    CT chest January 12, 2020 negative for PE.  Small sliding hiatal hernia.  Emphysematous changes.  Basilar atelectasis. (no mention of lung nodules )  Chest x-ray February 21, 2020 interstitial thickening of the lung bases and emphysema in the upper lobes.   Hospitalization June 2021 for COPD exacerbation and hypercarbic hypoxic respiratory failure required BiPAP support.  07/03/2021 Follow up: COPD and oxygen dependent respiratory failure, post hospital follow-up Patient returns for a follow-up visit.  She was recently hospitalized for hemoptysis and acute on chronic respiratory failure.  Patient recently had a elective cardiac cath that showed nonobstructive disease this was complicated by third-degree AV heart block.  She did require pacing.  Patient was released but continued to have ongoing shortness of breath and hemoptysis.  On admission she was felt to have  possible aspiration pneumonia.  CT chest was negative for PE.  Showed severe bullous emphysema, bibasilar airspace opacities and groundglass opacities.  She was treated with empiric antibiotics.  Did not feel that bronchoscopy was indicated.  Hemoptysis slowly improved. Prior to admission patient had been referred to palliative care, this was also recommended at discharge. Since discharge patient is feeling is feeling better, less cough and dyspnea. Still coughing up thick dark brown mucus.  AT rehab center now, doing PT and OT.  She wants to go back to Independent Living . Palliative care is coming this evening.      Allergies  Allergen Reactions   Gabapentin Other (See Comments)    Affects breathing   Pravastatin Sodium Other (See Comments)    Muscle aches    Rosuvastatin Calcium     Muscle Aches    Simvastatin     Muscle aches  Other reaction(s): Muscle pain   Amoxicillin Rash   Lisinopril Rash    Other reaction(s): rash   Sulfacetamide Rash   Tetracyclines & Related Rash    Immunization History  Administered Date(s) Administered   Fluad Quad(high Dose 65+) 04/07/2020   Influenza Split 04/14/2018, 03/16/2019, 04/07/2019   Influenza, High Dose Seasonal PF 04/14/2018, 05/01/2019   Influenza-Unspecified 04/27/2021   Moderna SARS-COV2 Booster Vaccination 02/10/2021   Moderna Sars-Covid-2 Vaccination 08/16/2019, 09/13/2019, 05/22/2020   Pneumococcal Conjugate-13 11/15/2016, 02/05/2018   Tdap 07/27/2018   Zoster Recombinat (Shingrix) 07/14/2018, 09/14/2018   Zoster, Live 07/14/2018, 09/14/2018    Past Medical History:  Diagnosis Date   Congestive heart disease ([place])    COPD (chronic obstructive  pulmonary disease) (HCC)    Diabetes (HCC) with atherosclerosis    Hyperlipemia    Osteoporosis    Primary hypertension     Tobacco History: Social History   Tobacco Use  Smoking Status Former   Packs/day: 1.50   Years: 40.00   Pack years: 60.00   Types: Cigarettes    Quit date: 2003   Years since quitting: 19.9  Smokeless Tobacco Never   Counseling given: Not Answered   Outpatient Medications Prior to Visit  Medication Sig Dispense Refill   acetaminophen (TYLENOL) 500 MG tablet Take 1,000 mg by mouth 2 (two) times daily as needed for mild pain or headache.     albuterol (VENTOLIN HFA) 108 (90 Base) MCG/ACT inhaler Inhale 1-2 puffs into the lungs every 6 (six) hours as needed for wheezing or shortness of breath. (Patient taking differently: Inhale 2 puffs into the lungs every 6 (six) hours as needed for wheezing or shortness of breath.) 8 g 5   aspirin EC 81 MG tablet Take 81 mg by mouth daily. Swallow whole.     bisacodyl (DULCOLAX) 10 MG suppository If not relieved by MOM, give 10 mg Bisacodyl suppositiory rectally X 1 dose in 24 hours as needed     brimonidine (ALPHAGAN) 0.2 % ophthalmic solution Place 1 drop into the left eye 2 (two) times daily.     budesonide (PULMICORT) 0.25 MG/2ML nebulizer solution Take 0.25 mg by nebulization 2 (two) times daily. 1 VIAL     Calcium Carbonate-Vitamin D 600-200 MG-UNIT TABS Take 1 tablet by mouth every evening.     carboxymethylcellulose (REFRESH PLUS) 0.5 % SOLN Place 1 drop into both eyes 3 (three) times daily as needed (dry eyes).     docusate sodium (COLACE) 100 MG capsule Take 100 mg by mouth daily.     estradiol (ESTRACE) 0.1 MG/GM vaginal cream Place 1 Applicatorful vaginally every other day.      ezetimibe (ZETIA) 10 MG tablet Take 1 tablet (10 mg total) by mouth daily. (Patient taking differently: Take 10 mg by mouth at bedtime.) 90 tablet 3   furosemide (LASIX) 20 MG tablet Take 1 tablet (20 mg total) by mouth daily. Take extra 1 tablet by mouth daily as needed for edema. (Patient taking differently: Take 20 mg by mouth daily as needed for fluid or edema.) 100 tablet 3   losartan (COZAAR) 50 MG tablet Take 1 tablet (50 mg total) by mouth daily. 90 tablet 3   magnesium hydroxide (MILK OF MAGNESIA) 400 MG/5ML  suspension If no BM in 3 days, give 30 cc Milk of Magnesium p.o. x 1 dose in 24 hours as needed     metFORMIN (GLUCOPHAGE) 500 MG tablet Take 1 tablet (500 mg total) by mouth daily with supper.     Multiple Vitamins-Minerals (EYE VITAMINS PO) Take 1 tablet by mouth daily.     Sodium Phosphates (RA SALINE ENEMA RE) If not relieved by Biscodyl suppository, give disposable Saline Enema rectally X 1 dose/24 hrs as needed. Contact MD as needed if no results from enema     arformoterol (BROVANA) 15 MCG/2ML NEBU USE 1 VIAL  IN  NEBULIZER TWICE  DAILY - morning and evening 120 mL 11   ipratropium (ATROVENT) 0.02 % nebulizer solution Take 2.5 mLs (0.5 mg total) by nebulization 4 (four) times daily. 300 mL 5   predniSONE (DELTASONE) 10 MG tablet Take 4 tablets (40 mg total) by mouth daily with breakfast for 5 days. Take 1 tablet (10 mg  total) by mouth daily with breakfast. (Patient not taking: Reported on 07/03/2021) 45 tablet 0   No facility-administered medications prior to visit.     Review of Systems:   Constitutional:   No  weight loss, night sweats,  Fevers, chills, + fatigue, or  lassitude.  HEENT:   No headaches,  Difficulty swallowing,  Tooth/dental problems, or  Sore throat,                No sneezing, itching, ear ache, +nasal congestion, post nasal drip,   CV:  No chest pain,  Orthopnea, PND, swelling in lower extremities, anasarca, dizziness, palpitations, syncope.   GI  No heartburn, indigestion, abdominal pain, nausea, vomiting, diarrhea, change in bowel habits, loss of appetite, bloody stools.   Resp:   No chest wall deformity  Skin: no rash or lesions.  GU: no dysuria, change in color of urine, no urgency or frequency.  No flank pain, no hematuria   MS:  No joint pain or swelling.  No decreased range of motion.  No back pain.    Physical Exam  BP 130/68 (BP Location: Right Arm, Patient Position: Sitting, Cuff Size: Large)    Pulse (!) 107    Temp 98.5 F (36.9 C) (Oral)     Ht 5\' 2"  (1.575 m)    Wt 157 lb 3.2 oz (71.3 kg)    SpO2 98%    BMI 28.75 kg/m   GEN: A/Ox3; pleasant , NAD, wheel chair on O2    HEENT:  [place]/AT,   NOSE-clear, THROAT-clear, no lesions, no postnasal drip or exudate noted.   NECK:  Supple w/ fair ROM; no JVD; normal carotid impulses w/o bruits; no thyromegaly or nodules palpated; no lymphadenopathy.    RESP  Clear  P & A; w/o, wheezes/ rales/ or rhonchi. no accessory muscle use, no dullness to percussion  CARD:  RRR, no m/r/g,  No  peripheral edema, pulses intact, no cyanosis or clubbing.  GI:   Soft & nt; nml bowel sounds; no organomegaly or masses detected.   Musco: Warm bil, no deformities or joint swelling noted.   Neuro: alert, no focal deficits noted.    Skin: Warm, no lesions or rashes    Lab Results:    BMET   BNP   Imaging: DG Chest 2 View  Result Date: 06/23/2021 CLINICAL DATA:  Hemoptysis EXAM: CHEST - 2 VIEW COMPARISON:  06/19/2021 FINDINGS: Left lower lobe infiltrate unchanged. Mild right lower lobe airspace disease unchanged Apical emphysema.  No pneumothorax or heart failure. IMPRESSION: Bibasilar airspace disease left greater than right unchanged. Electronically Signed   By: Franchot Gallo M.D.   On: 06/23/2021 09:48   CT Angio Chest PE W and/or Wo Contrast  Result Date: 06/19/2021 CLINICAL DATA:  Hemoptysis since recent cardiac catheterization and pacemaker removal last week. Chest soreness. EXAM: CT ANGIOGRAPHY CHEST WITH CONTRAST TECHNIQUE: Multidetector CT imaging of the chest was performed using the standard protocol during bolus administration of intravenous contrast. Multiplanar CT image reconstructions and MIPs were obtained to evaluate the vascular anatomy. CONTRAST:  79mL OMNIPAQUE IOHEXOL 350 MG/ML SOLN COMPARISON:  Plain film of earlier in the day. CTA chest 06/11/2021 FINDINGS: Cardiovascular: The quality of this exam for evaluation of pulmonary embolism is good. Mild motion degradation. No evidence  of pulmonary embolism. Apparent vascular filling defect on 331/7 in the right lower lobe is secondary to fluid within the endobronchial tree and is similar to on the prior. Aortic atherosclerosis. Tortuous thoracic aorta. Normal  heart size, without pericardial effusion. Three vessel coronary artery calcification. Mediastinum/Nodes: No mediastinal or hilar adenopathy. Lungs/Pleura: No pleural fluid.  Advanced bullous emphysema. Bibasilar dependent airspace and less so ground-glass opacity, new since the prior CT. Upper Abdomen: Normal imaged portions of the liver, spleen, stomach pancreas, adrenal glands. Upper pole left renal 1.2 cm incompletely imaged low-density lesion is likely a cyst. Musculoskeletal: No acute osseous abnormality. Review of the MIP images confirms the above findings. IMPRESSION: 1. No evidence of pulmonary embolism. Mild motion degradation. 2. New bibasilar airspace and less so ground-glass opacity, favored to represent aspiration or infection. 3. Aortic Atherosclerosis (ICD10-I70.0) and Emphysema (ICD10-J43.9). Coronary artery atherosclerosis. Electronically Signed   By: Abigail Miyamoto M.D.   On: 06/19/2021 16:13   CT Angio Chest Pulmonary Embolism (PE) W or WO Contrast  Result Date: 06/11/2021 CLINICAL DATA:  Shortness of breath. Elevated D-dimer. Previous tobacco abuse. EXAM: CT ANGIOGRAPHY CHEST WITH CONTRAST TECHNIQUE: Multidetector CT imaging of the chest was performed using the standard protocol during bolus administration of intravenous contrast. Multiplanar CT image reconstructions and MIPs were obtained to evaluate the vascular anatomy. CONTRAST:  36mL OMNIPAQUE IOHEXOL 350 MG/ML SOLN COMPARISON:  01/12/2020 FINDINGS: Cardiovascular: Heart size normal. No pericardial effusion. The RV is nondilated. Satisfactory opacification of pulmonary arteries noted, and there is no evidence of pulmonary emboli. Scattered coronary calcifications. Adequate contrast opacification of the thoracic  aorta with no evidence of dissection, aneurysm, or stenosis. There is classic 3-vessel brachiocephalic arch anatomy without proximal stenosis. Scattered calcified plaque in the arch and descending thoracic aorta as well as in the proximal visualized nondilated abdominal aorta. Mediastinum/Nodes: No mediastinal hematoma, mass or adenopathy. Small hiatal hernia. Lungs/Pleura: No pleural effusion. No pneumothorax. Pulmonary emphysema most marked in the apices. Fluid/Debris in segmental subsegmental airways in the posterior right lower lobe. No nodule or airspace infiltrate. Upper Abdomen: No acute findings. Stable bilateral low-attenuation renal lesions largest 3.1 cm fluid attenuation mid left lesion possibly cysts. Musculoskeletal: No chest wall abnormality. No acute or significant osseous findings. Review of the MIP images confirms the above findings. IMPRESSION: 1. Negative for acute PE or thoracic aortic dissection. 2. Coronary and aortic Atherosclerosis (ICD10-I70.0) 3. Emphysema (ICD10-J43.9). Electronically Signed   By: Lucrezia Europe M.D.   On: 06/11/2021 11:04   CARDIAC CATHETERIZATION  Result Date: 06/14/2021   Dist LAD lesion is 30% stenosed.   Mid RCA lesion is 40% stenosed.   LV end diastolic pressure is normal.   There is no aortic valve stenosis. Nonobstructive CAD Normal LV filling pressures with EDP 15 mm Hg Normal RA pressure. Mildly elevated RV pressure 48/5 mm Hg. Unable to measure PA pressure due to development of complete heart block. 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 Plan: will admit to unit for continued monitoring. Continue transvenous pacing. Hold beta blocker.   DG Chest Portable 1 View  Result Date: 06/19/2021 CLINICAL DATA:  Hemoptysis x Friday, pt mentioned having a cath done on 12/1 Tech wore surgical mask/gloves, pt had on maskhemoptysis EXAM: PORTABLE CHEST 1 VIEW COMPARISON:  None. FINDINGS: Normal mediastinum and cardiac silhouette.  Normal pulmonary vasculature. No evidence of effusion, infiltrate, or pneumothorax. No acute bony abnormality. IMPRESSION: No acute cardiopulmonary process. Electronically Signed   By: Suzy Bouchard M.D.   On: 06/19/2021 14:22   DG CHEST PORT 1 VIEW  Result Date: 06/14/2021 CLINICAL DATA:  Shortness of breath. EXAM: PORTABLE CHEST 1 VIEW COMPARISON:  Chest x-ray 01/12/2020. FINDINGS: The aorta  is ectatic. Cardiac silhouette is within normal limits. There hazy, patchy and interstitial opacities in the bilateral lower lobes, left greater than right. There is no pleural effusion or pneumothorax. No acute fractures are seen. IMPRESSION: 1. Bibasilar infiltrates, left greater than right. Findings may be related to infection and or edema. Electronically Signed   By: Ronney Asters M.D.   On: 06/14/2021 19:08      No flowsheet data found.  No results found for: NITRICOXIDE      Assessment & Plan:   Chronic obstructive pulmonary disease with acute exacerbation (HCC) Recent exacerbation now resolved.  Patient is no longer on prednisone.  And seems to be doing well.  We will continue on aggressive maintenance regimen with triple therapy budesonide, Brovana, ipratropium.  Plan  Patient Instructions  Continue on Budesonide Neb Twice daily  .  Continue on Brovana Twice daily  .  Continue on Ipratropium Neb four times a day   Activity as tolerated Saline nasal spray Twice daily   Saline nasal gel At bedtime   Robitussin Cough syrup As needed  cough/congestion  Palliative care as discussed. Continue on oxygen 4l/m rest and 5l/m activity . O2 sats goal >88-90%.  Follow-up with Dr. Elsworth Soho or Slyvia Lartigue NP in 4-6 weeks  and As needed  Please contact office for sooner follow up if symptoms do not improve or worsen or seek emergency care       Chronic respiratory failure with hypoxia [facility]) Recent acute on chronic hypoxic respiratory failure with probable aspiration pneumonia and COPD flare.  Now  improved and back to baseline.  Patient continue on oxygen 4 L at rest and 5 L with activity.  Goals of care, counseling/discussion Palliative care consult is pending for today.  Physical deconditioning Continue with physical therapy and Occupational Therapy at rehab.  Would recommend home PT if covered.  Aspiration pneumonia ([place]) Aspiration pneumonia suspected.  Patient is encouraged on aspiration precautions.  She has completed her course of antibiotics clinically is much improved.  Plan  Patient Instructions  Continue on Budesonide Neb Twice daily  .  Continue on Brovana Twice daily  .  Continue on Ipratropium Neb four times a day   Activity as tolerated Saline nasal spray Twice daily   Saline nasal gel At bedtime   Robitussin Cough syrup As needed  cough/congestion  Palliative care as discussed. Continue on oxygen 4l/m rest and 5l/m activity . O2 sats goal >88-90%.  Follow-up with Dr. Elsworth Soho or Anwyn Kriegel NP in 4-6 weeks  and As needed  Please contact office for sooner follow up if symptoms do not improve or worsen or seek emergency care         Rexene Edison, NP 07/03/2021

## 2021-07-03 NOTE — Patient Instructions (Addendum)
Continue on Budesonide Neb Twice daily  .  Continue on Brovana Twice daily  .  Continue on Ipratropium Neb four times a day   Activity as tolerated Saline nasal spray Twice daily   Saline nasal gel At bedtime   Robitussin Cough syrup As needed  cough/congestion  Palliative care as discussed. Continue on oxygen 4l/m rest and 5l/m activity . O2 sats goal >88-90%.  Follow-up with Dr. Vassie Loll or Nester Bachus NP in 4-6 weeks  and As needed  Please contact office for sooner follow up if symptoms do not improve or worsen or seek emergency care

## 2021-07-04 LAB — COMPREHENSIVE METABOLIC PANEL
Calcium: 9.2 (ref 8.7–10.7)
GFR calc Af Amer: >90
GFR calc non Af Amer: 81.72

## 2021-07-04 LAB — CBC AND DIFFERENTIAL
HCT: 29 — AB (ref 36–46)
Hemoglobin: 9.5 — AB (ref 12.0–16.0)
Platelets: 234 (ref 150–399)
WBC: 6.2

## 2021-07-04 LAB — BASIC METABOLIC PANEL
BUN: 16 (ref 4–21)
Chloride: 93 — AB (ref 99–108)
Creatinine: 0.7 (ref 0.5–1.1)
Glucose: 114
Potassium: 4.5 (ref 3.4–5.3)
Sodium: 140 (ref 137–147)

## 2021-07-04 LAB — CBC: RBC: 2.92 — AB (ref 3.87–5.11)

## 2021-07-05 ENCOUNTER — Ambulatory Visit (HOSPITAL_BASED_OUTPATIENT_CLINIC_OR_DEPARTMENT_OTHER): Payer: Medicare HMO | Admitting: Family

## 2021-07-06 ENCOUNTER — Telehealth: Payer: Self-pay | Admitting: Cardiology

## 2021-07-06 ENCOUNTER — Non-Acute Institutional Stay (SKILLED_NURSING_FACILITY): Payer: Medicare HMO | Admitting: Adult Health

## 2021-07-06 ENCOUNTER — Encounter: Payer: Self-pay | Admitting: Adult Health

## 2021-07-06 DIAGNOSIS — I10 Essential (primary) hypertension: Secondary | ICD-10-CM | POA: Diagnosis not present

## 2021-07-06 DIAGNOSIS — R000 Tachycardia, unspecified: Secondary | ICD-10-CM

## 2021-07-06 DIAGNOSIS — E1169 Type 2 diabetes mellitus with other specified complication: Secondary | ICD-10-CM | POA: Diagnosis not present

## 2021-07-06 DIAGNOSIS — J4489 Other specified chronic obstructive pulmonary disease: Secondary | ICD-10-CM

## 2021-07-06 DIAGNOSIS — I5022 Chronic systolic (congestive) heart failure: Secondary | ICD-10-CM | POA: Diagnosis not present

## 2021-07-06 DIAGNOSIS — J449 Chronic obstructive pulmonary disease, unspecified: Secondary | ICD-10-CM | POA: Diagnosis not present

## 2021-07-06 NOTE — Telephone Encounter (Signed)
° °  Pt's daughter calling, she said pt is in rehab right now and she said rehab doctor wants pt to be seen sooner by Dr. Shari Prows regarding pt's beta blocker

## 2021-07-06 NOTE — Progress Notes (Signed)
Location:  [facility] Room Number: [id number] Place of Service:  SNF (31) Provider:  Durenda Age, DNP, FNP-BC  Patient Care Team: London Pepper, MD as PCP - General (Family Medicine) Freada Bergeron, MD as PCP - Cardiology (Cardiology)  Extended Emergency Contact Information Primary Emergency Contact: Swing,Carol Home Phone: [phone] Mobile Phone: [phone] Relation: Daughter Secondary Emergency Contact: [place], [place] [address] Johnnette Litter of [place] Phone: [phone] Mobile Phone: [phone] Relation: Daughter  Code Status: Full Code   Goals of care: Advanced Directive information Advanced Directives 07/06/2021  Does Patient Have a Medical Advance Directive? No  Type of Advance Directive -  Does patient want to make changes to medical advance directive? No - Patient declined  Copy of [place] in Chart? -  Would patient like information on creating a medical advance directive? -     Chief Complaint  Patient presents with   Acute Visit    Tachycardia    HPI:  Pt is a 80 y.o. female who is being seen for tachycardia. Noted HRs as follows: 92, 104, 112, 100, 102.  Coreg was recently discontinued due to having complete heart block during cardiac catheterization. SBPs ranging from 114-149, with outlier 175.  She takes losartan 50 mg 1 tablet daily for hypertension. No SOB nor wheezing noted. She recently completed prednisone 10 mg daily x5 days.  She takes budesonide 0.25 mg / 2 mL 1 vial via nebulizer twice a day and ipratropium-albuterol 0.5-3 (2.5) mg/3 mL 1 vial via nebulization 4 times a day for COPD.  On chronic O2 at 5 L/min via [place].  No CBGs noted on record.  She takes metformin 500 mg 1 tab orally every evening with supper for diabetes mellitus.  She is currently having short-term rehabilitation post hospital admission 06/19/2021 to 06/26/2021.   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    Hyperlipemia    Osteoporosis    Primary hypertension    Past Surgical History:  Procedure Laterality Date   RIGHT/LEFT HEART CATH AND CORONARY ANGIOGRAPHY N/A 06/14/2021   Procedure: RIGHT/LEFT HEART CATH AND CORONARY ANGIOGRAPHY;  Surgeon: [name], [name], MD;  Location: [facility];  Service: Cardiovascular;  Laterality: N/A;   TEMPORARY PACEMAKER N/A 06/14/2021   Procedure: TEMPORARY PACEMAKER;  Surgeon: [name], [name], MD;  Location: [facility];  Service: Cardiovascular;  Laterality: N/A;    Allergies  Allergen Reactions   Gabapentin Other (See Comments)    Affects breathing   Pravastatin Sodium Other (See Comments)    Muscle aches    Rosuvastatin Calcium     Muscle Aches    Simvastatin     Muscle aches  Other reaction(s): Muscle pain   Amoxicillin Rash   Lisinopril Rash    Other reaction(s): rash   Sulfacetamide Rash   Tetracyclines & Related Rash    Outpatient Encounter Medications as of 07/06/2021  Medication Sig   acetaminophen (TYLENOL) 500 MG tablet Take 1,000 mg by mouth 2 (two) times daily as needed for mild pain or headache.   albuterol (VENTOLIN HFA) 108 (90 Base) MCG/ACT inhaler Inhale 1-2 puffs into the lungs every 6 (six) hours as needed for wheezing or shortness of breath. (Patient taking differently: Inhale 2 puffs into the lungs every 6 (six) hours as  needed for wheezing or shortness of breath.)   arformoterol (BROVANA) 15 MCG/2ML NEBU USE 1 VIAL  IN  NEBULIZER TWICE  DAILY - morning and evening   aspirin EC 81 MG tablet Take 81 mg by mouth daily. Swallow whole.   bisacodyl (DULCOLAX) 10 MG suppository If not relieved by MOM, give 10 mg Bisacodyl suppositiory rectally X 1 dose in 24 hours as needed   brimonidine (ALPHAGAN) 0.2 % ophthalmic solution Place 1 drop into the left eye 2 (two) times daily.   budesonide  (PULMICORT) 0.25 MG/2ML nebulizer solution Take 0.25 mg by nebulization 2 (two) times daily. 1 VIAL   Calcium Carbonate-Vitamin D 600-200 MG-UNIT TABS Take 1 tablet by mouth every evening.   carboxymethylcellulose (REFRESH PLUS) 0.5 % SOLN Place 1 drop into both eyes 3 (three) times daily as needed (dry eyes).   docusate sodium (COLACE) 100 MG capsule Take 100 mg by mouth daily.   estradiol (ESTRACE) 0.1 MG/GM vaginal cream Place 1 Applicatorful vaginally every other day.    ezetimibe (ZETIA) 10 MG tablet Take 1 tablet (10 mg total) by mouth daily.   furosemide (LASIX) 20 MG tablet Take 1 tablet (20 mg total) by mouth daily. Take extra 1 tablet by mouth daily as needed for edema.   guaiFENesin (ROBITUSSIN) 100 MG/5ML liquid Take 10 mLs by mouth in the morning, at noon, in the evening, and at bedtime. For 2 weeks   losartan (COZAAR) 50 MG tablet Take 1 tablet (50 mg total) by mouth daily.   magnesium hydroxide (MILK OF MAGNESIA) 400 MG/5ML suspension If no BM in 3 days, give 30 cc Milk of Magnesium p.o. x 1 dose in 24 hours as needed   metFORMIN (GLUCOPHAGE) 500 MG tablet Take 1 tablet (500 mg total) by mouth daily with supper.   Multiple Vitamins-Minerals (EYE VITAMINS PO) Take 1 tablet by mouth daily.   NON FORMULARY Diet:HH chopped/thin diet consistency   Sodium Phosphates (RA SALINE ENEMA RE) If not relieved by Biscodyl suppository, give disposable Saline Enema rectally X 1 dose/24 hrs as needed. Contact MD as needed if no results from enema   ipratropium (ATROVENT) 0.02 % nebulizer solution Take 2.5 mLs (0.5 mg total) by nebulization 4 (four) times daily.   [DISCONTINUED] predniSONE (DELTASONE) 10 MG tablet Take 4 tablets (40 mg total) by mouth daily with breakfast for 5 days. Take 1 tablet (10 mg total) by mouth daily with breakfast. (Patient not taking: Reported on 07/03/2021)   No facility-administered encounter medications on file as of 07/06/2021.    Review of Systems  GENERAL: No  change in appetite, no fatigue, no weight changes, no fever or chills, MOUTH and THROAT: Denies oral discomfort, gingival pain or bleeding RESPIRATORY: no cough, SOB, DOE, wheezing, hemoptysis CARDIAC: No chest pain, edema or palpitations GI: No abdominal pain, diarrhea, constipation, heart burn, nausea or vomiting GU: Denies dysuria, frequency, hematuria, incontinence, or discharge NEUROLOGICAL: Denies dizziness, syncope, numbness, or headache PSYCHIATRIC: Denies feelings of depression or anxiety. No report of hallucinations, insomnia, paranoia, or agitation   Immunization History  Administered Date(s) Administered   Fluad Quad(high Dose 65+) 04/07/2020   Influenza Split 04/14/2018, 03/16/2019, 04/07/2019   Influenza, High Dose Seasonal PF 04/14/2018, 05/01/2019   Influenza-Unspecified 04/27/2021   Moderna SARS-COV2 Booster Vaccination 02/10/2021   Moderna Sars-Covid-2 Vaccination 08/16/2019, 09/13/2019, 05/22/2020   Pneumococcal Conjugate-13 11/15/2016, 02/05/2018   Tdap 07/27/2018   Zoster Recombinat (Shingrix) 07/14/2018, 09/14/2018   Zoster, Live 07/14/2018, 09/14/2018   Pertinent  Health Maintenance  Due  Topic Date Due   INFLUENZA VACCINE  Completed   DEXA SCAN  Completed   Fall Risk 06/24/2021 06/24/2021 06/25/2021 06/25/2021 06/26/2021  Falls in the past year? - - - - -  Patient Fall Risk Level High fall risk Moderate fall risk Moderate fall risk Moderate fall risk Moderate fall risk  Patient at Risk for Falls Due to - - - - -     Vitals:   07/06/21 0959  BP: 129/76  Pulse: 92  Resp: 20  Temp: (!) 97.5 F (36.4 C)  Weight: 156 lb (70.8 kg)  Height: 5\' 2"  (1.575 m)   Body mass index is 28.53 kg/m.  Physical Exam  GENERAL APPEARANCE: Well nourished. In no acute distress. Normal body habitus SKIN:  Skin is warm and dry.  MOUTH and THROAT: Lips are without lesions. Oral mucosa is moist and without lesions.  RESPIRATORY: Breathing is even & unlabored, BS  CTAB CARDIAC: RRR, no murmur,no extra heart sounds, no edema GI: Abdomen soft, normal BS, no masses, no tenderness, NEUROLOGICAL: There is no tremor. Speech is clear. Alert and oriented X 3. PSYCHIATRIC:  Affect and behavior are appropriate  Labs reviewed: Recent Labs    06/15/21 0237 06/19/21 1415 06/19/21 1419 06/19/21 1505 06/23/21 1204 06/23/21 1523 06/25/21 0258 07/04/21 0000  NA 139   < >  --    < > 134* 133* 132* 140  K 4.2   < >  --    < > 5.4* 5.0 3.8 4.5  CL 93*   < >  --    < > 93* 92* 89* 93*  CO2 39*   < >  --    < > 36* 31 35*  --   GLUCOSE 122*   < >  --    < > 160* 125* 100*  --   BUN 17   < >  --    < > 21 22 13 16   CREATININE 0.86   < >  --    < > 0.76 0.68 0.68 0.7  CALCIUM 8.9   < >  --    < > 8.4* 8.6* 8.6* 9.2  MG 1.7  --  1.8  --   --   --   --   --    < > = values in this interval not displayed.   Recent Labs    06/19/21 1415 06/23/21 1204  AST 34 27  ALT 30 30  ALKPHOS 50 41  BILITOT 0.9 0.7  PROT 6.7 5.4*  ALBUMIN 3.8 3.0*   Recent Labs    06/05/21 1054 06/14/21 1437 06/15/21 0237 06/19/21 1415 06/19/21 1505 06/23/21 0145 06/23/21 1204 06/25/21 0258 07/04/21 0000  WBC 9.3  --  11.8* 11.4*   < > 10.5 12.0* 9.0 6.2  NEUTROABS 7.8*  --  9.9* 10.3*  --   --   --   --   --   HGB 11.1*   < > 10.2* 11.5*   < > 9.6* 9.9* 9.5* 9.5*  HCT 34.2*   < > 33.1* 35.1*   < > 30.6* 31.9* 29.6* 29*  MCV 98.2  --  104.7* 98.0   < > 102.7* 103.9* 102.4*  --   PLT 226.0  --  213 278   < > 237 234 230 234   < > = values in this interval not displayed.   Lab Results  Component Value Date   TSH 0.661 01/13/2020   No results  found for: HGBA1C No results found for: CHOL, HDL, LDLCALC, LDLDIRECT, TRIG, CHOLHDL  Significant Diagnostic Results in last 30 days:  DG Chest 2 View  Result Date: 06/23/2021 CLINICAL DATA:  Hemoptysis EXAM: CHEST - 2 VIEW COMPARISON:  06/19/2021 FINDINGS: Left lower lobe infiltrate unchanged. Mild right lower lobe airspace  disease unchanged Apical emphysema.  No pneumothorax or heart failure. IMPRESSION: Bibasilar airspace disease left greater than right unchanged. Electronically Signed   By: Marlan Palau M.D.   On: 06/23/2021 09:48   CT Angio Chest PE W and/or Wo Contrast  Result Date: 06/19/2021 CLINICAL DATA:  Hemoptysis since recent cardiac catheterization and pacemaker removal last week. Chest soreness. EXAM: CT ANGIOGRAPHY CHEST WITH CONTRAST TECHNIQUE: Multidetector CT imaging of the chest was performed using the standard protocol during bolus administration of intravenous contrast. Multiplanar CT image reconstructions and MIPs were obtained to evaluate the vascular anatomy. CONTRAST:  22mL OMNIPAQUE IOHEXOL 350 MG/ML SOLN COMPARISON:  Plain film of earlier in the day. CTA chest 06/11/2021 FINDINGS: Cardiovascular: The quality of this exam for evaluation of pulmonary embolism is good. Mild motion degradation. No evidence of pulmonary embolism. Apparent vascular filling defect on 331/7 in the right lower lobe is secondary to fluid within the endobronchial tree and is similar to on the prior. Aortic atherosclerosis. Tortuous thoracic aorta. Normal heart size, without pericardial effusion. Three vessel coronary artery calcification. Mediastinum/Nodes: No mediastinal or hilar adenopathy. Lungs/Pleura: No pleural fluid.  Advanced bullous emphysema. Bibasilar dependent airspace and less so ground-glass opacity, new since the prior CT. Upper Abdomen: Normal imaged portions of the liver, spleen, stomach pancreas, adrenal glands. Upper pole left renal 1.2 cm incompletely imaged low-density lesion is likely a cyst. Musculoskeletal: No acute osseous abnormality. Review of the MIP images confirms the above findings. IMPRESSION: 1. No evidence of pulmonary embolism. Mild motion degradation. 2. New bibasilar airspace and less so ground-glass opacity, favored to represent aspiration or infection. 3. Aortic Atherosclerosis (ICD10-I70.0)  and Emphysema (ICD10-J43.9). Coronary artery atherosclerosis. Electronically Signed   By: Jeronimo Greaves M.D.   On: 06/19/2021 16:13   CT Angio Chest Pulmonary Embolism (PE) W or WO Contrast  Result Date: 06/11/2021 CLINICAL DATA:  Shortness of breath. Elevated D-dimer. Previous tobacco abuse. EXAM: CT ANGIOGRAPHY CHEST WITH CONTRAST TECHNIQUE: Multidetector CT imaging of the chest was performed using the standard protocol during bolus administration of intravenous contrast. Multiplanar CT image reconstructions and MIPs were obtained to evaluate the vascular anatomy. CONTRAST:  67mL OMNIPAQUE IOHEXOL 350 MG/ML SOLN COMPARISON:  01/12/2020 FINDINGS: Cardiovascular: Heart size normal. No pericardial effusion. The RV is nondilated. Satisfactory opacification of pulmonary arteries noted, and there is no evidence of pulmonary emboli. Scattered coronary calcifications. Adequate contrast opacification of the thoracic aorta with no evidence of dissection, aneurysm, or stenosis. There is classic 3-vessel brachiocephalic arch anatomy without proximal stenosis. Scattered calcified plaque in the arch and descending thoracic aorta as well as in the proximal visualized nondilated abdominal aorta. Mediastinum/Nodes: No mediastinal hematoma, mass or adenopathy. Small hiatal hernia. Lungs/Pleura: No pleural effusion. No pneumothorax. Pulmonary emphysema most marked in the apices. Fluid/Debris in segmental subsegmental airways in the posterior right lower lobe. No nodule or airspace infiltrate. Upper Abdomen: No acute findings. Stable bilateral low-attenuation renal lesions largest 3.1 cm fluid attenuation mid left lesion possibly cysts. Musculoskeletal: No chest wall abnormality. No acute or significant osseous findings. Review of the MIP images confirms the above findings. IMPRESSION: 1. Negative for acute PE or thoracic aortic dissection. 2. Coronary and aortic Atherosclerosis (  ICD10-I70.0) 3. Emphysema (ICD10-J43.9).  Electronically Signed   By: Lucrezia Europe M.D.   On: 06/11/2021 11:04   CARDIAC CATHETERIZATION  Result Date: 06/14/2021   Dist LAD lesion is 30% stenosed.   Mid RCA lesion is 40% stenosed.   LV end diastolic pressure is normal.   There is no aortic valve stenosis. Nonobstructive CAD Normal LV filling pressures with EDP 15 mm Hg Normal RA pressure. Mildly elevated RV pressure 48/5 mm Hg. Unable to measure PA pressure due to development of complete heart block. 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 Plan: will admit to unit for continued monitoring. Continue transvenous pacing. Hold beta blocker.   DG Chest Portable 1 View  Result Date: 06/19/2021 CLINICAL DATA:  Hemoptysis x Friday, pt mentioned having a cath done on 12/1 Tech wore surgical mask/gloves, pt had on maskhemoptysis EXAM: PORTABLE CHEST 1 VIEW COMPARISON:  None. FINDINGS: Normal mediastinum and cardiac silhouette. Normal pulmonary vasculature. No evidence of effusion, infiltrate, or pneumothorax. No acute bony abnormality. IMPRESSION: No acute cardiopulmonary process. Electronically Signed   By: Suzy Bouchard M.D.   On: 06/19/2021 14:22   DG CHEST PORT 1 VIEW  Result Date: 06/14/2021 CLINICAL DATA:  Shortness of breath. EXAM: PORTABLE CHEST 1 VIEW COMPARISON:  Chest x-ray 01/12/2020. FINDINGS: The aorta is ectatic. Cardiac silhouette is within normal limits. There hazy, patchy and interstitial opacities in the bilateral lower lobes, left greater than right. There is no pleural effusion or pneumothorax. No acute fractures are seen. IMPRESSION: 1. Bibasilar infiltrates, left greater than right. Findings may be related to infection and or edema. Electronically Signed   By: Ronney Asters M.D.   On: 06/14/2021 19:08    Assessment/Plan  1. Tachycardia -   will start on metoprolol tartrate 25 mg give 1/2 tab = 12.5 mg BID -  follow up with cardiology  2. Essential hypertension -  BPs stable, continue  losartan  3. COPD with chronic bronchitis and emphysema ([place]) -  no SOB -   Completed prednisone 10 mg daily x5 days and will start on prednisone 5 mg daily Continue O2 @ 5L/min via [place], PRN albuterol HFA, arformoterol nebulization and ipratropium BR nebulization -   Follows up with [facility] ([place] pulmonology  4. Chronic systolic heart failure (HCC) -   no SOB, continue Lasix  5. Type 2 diabetes mellitus without long-term use of insulin -   Continue metformin and start CBG checks   Family/ staff Communication: Discussed plan of care with daughter, patient and charge nurse.  Labs/tests ordered:   TSH, free T4 and free T3, A1c  Goals of care:   Short-term care   Durenda Age, DNP, MSN, FNP-BC [facility] [phone] (Monday-Friday 8:00 a.m. - 5:00 p.m.) [phone] (after hours)

## 2021-07-06 NOTE — Telephone Encounter (Signed)
Pts daughter called to report that the pt is in rehab currently and should be D/C in the next week or so... she saw Pulmonary and the rehab provider and they suggested that she changes her Coreg to Toprol XL 25 mg since her HR has continued to be the low 100's.   She says they plan to continue to monitor her while he is there but have asked her to call and move up her appt with Dr. Shari Prows. I moved it up to 07/17/21 but if she is still in rehab or just getting home she will call back and see if she can move it to another date.   I will forward to Dr. Shari Prows for her review.    Unable to confirm med change so med list may not be updated.

## 2021-07-10 LAB — HEMOGLOBIN A1C: Hemoglobin A1C: 5.5

## 2021-07-10 LAB — TSH: TSH: 1.49 (ref 0.41–5.90)

## 2021-07-11 NOTE — Telephone Encounter (Signed)
Pt will see Dr. Shari Prows in clinic next week on 07/17/21, to further discuss this matter.

## 2021-07-13 ENCOUNTER — Non-Acute Institutional Stay (SKILLED_NURSING_FACILITY): Payer: Medicare HMO | Admitting: Adult Health

## 2021-07-13 ENCOUNTER — Encounter: Payer: Self-pay | Admitting: Adult Health

## 2021-07-13 DIAGNOSIS — M6281 Muscle weakness (generalized): Secondary | ICD-10-CM | POA: Diagnosis not present

## 2021-07-13 DIAGNOSIS — E785 Hyperlipidemia, unspecified: Secondary | ICD-10-CM

## 2021-07-13 DIAGNOSIS — J9611 Chronic respiratory failure with hypoxia: Secondary | ICD-10-CM | POA: Diagnosis not present

## 2021-07-13 DIAGNOSIS — J9621 Acute and chronic respiratory failure with hypoxia: Secondary | ICD-10-CM

## 2021-07-13 DIAGNOSIS — R000 Tachycardia, unspecified: Secondary | ICD-10-CM

## 2021-07-13 DIAGNOSIS — I5022 Chronic systolic (congestive) heart failure: Secondary | ICD-10-CM | POA: Diagnosis not present

## 2021-07-13 DIAGNOSIS — E1169 Type 2 diabetes mellitus with other specified complication: Secondary | ICD-10-CM

## 2021-07-13 DIAGNOSIS — E119 Type 2 diabetes mellitus without complications: Secondary | ICD-10-CM | POA: Diagnosis not present

## 2021-07-13 DIAGNOSIS — R2681 Unsteadiness on feet: Secondary | ICD-10-CM | POA: Diagnosis not present

## 2021-07-13 DIAGNOSIS — J449 Chronic obstructive pulmonary disease, unspecified: Secondary | ICD-10-CM | POA: Diagnosis not present

## 2021-07-13 DIAGNOSIS — I10 Essential (primary) hypertension: Secondary | ICD-10-CM

## 2021-07-13 DIAGNOSIS — R042 Hemoptysis: Secondary | ICD-10-CM | POA: Diagnosis not present

## 2021-07-13 DIAGNOSIS — R1312 Dysphagia, oropharyngeal phase: Secondary | ICD-10-CM | POA: Diagnosis not present

## 2021-07-13 NOTE — Progress Notes (Signed)
Location:  [facility] Room Number: 311-A Place of Service:  SNF (31) Provider:  Kenard Gower, DNP, FNP-BC  Patient Care Team: Farris Has, MD as PCP - General (Family Medicine) Meriam Sprague, MD as PCP - Cardiology (Cardiology)  Extended Emergency Contact Information Primary Emergency Contact: Swing,Carol Home Phone: [phone] Mobile Phone: [phone] Relation: Daughter Secondary Emergency Contact: [place], [place] [address] D[place] of [place] Home Phone: [phone] Mobile Phone: [phone] Relation: Daughter  Code Status:  Full Code  Goals of care: Advanced Directive information Advanced Directives 07/13/2021  Does Patient Have a Medical Advance Directive? No  Type of Advance Directive -  Does patient want to make changes to medical advance directive? No - Patient declined  Copy of Healthcare Power of Attorney in Chart? -  Would patient like information on creating a medical advance directive? -     Chief Complaint  Patient presents with   Discharge Note    HPI:  Pt is a 80 y.o. female who is for discharge home to [facility] on 07/14/21 with Home health PT and OT.  She was admitted to [facility] on 06/26/21 post hospitalization 06/19/21 to 06/26/21.  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  She was recently discharged from the hospital on 06/15/2021 for right and left-sided cardiac catheterization.  She was coughing up small amount of blood clot about the size of a quarter 3-4X a day, mostly in the mornings. CT angiogram negative for PE but showed groundglass opacities.  She completed 5-day course of antibiotics.  At [facility], she was started on Metoprolol 12.5 mg BID due to tachycardia. HRs are now controlled, ranging from 72 to 80.  Patient was  admitted to this facility for short-term rehabilitation after the patient's recent hospitalization.  Patient has completed SNF rehabilitation and therapy has cleared the patient for discharge.   Past Medical History:  Diagnosis Date   Congestive heart disease (HCC)    COPD (chronic obstructive pulmonary disease) (HCC)    Diabetes (HCC) with atherosclerosis    Hyperlipemia    Osteoporosis    Primary hypertension    Past Surgical History:  Procedure Laterality Date   RIGHT/LEFT HEART CATH AND CORONARY ANGIOGRAPHY N/A 06/14/2021   Procedure: RIGHT/LEFT HEART CATH AND CORONARY ANGIOGRAPHY;  Surgeon: Swaziland, [name], MD;  Location: [facility] INVASIVE CV LAB;  Service: Cardiovascular;  Laterality: N/A;   TEMPORARY PACEMAKER N/A 06/14/2021   Procedure: TEMPORARY PACEMAKER;  Surgeon: [place]il[place], [name], MD;  Location: [facility] INVASIVE CV LAB;  Service: Cardiovascular;  Laterality: N/A;    Allergies  Allergen Reactions   Gabapentin Other (See Comments)    Affects breathing   Pravastatin Sodium Other (See Comments)    Muscle aches    Rosuvastatin Calcium     Muscle Aches    Simvastatin     Muscle aches  Other reaction(s): Muscle pain   Amoxicillin Rash   Lisinopril Rash    Other reaction(s): rash   Sulfacetamide Rash   Tetracyclines & Related Rash    Outpatient Encounter Medications as of 07/13/2021  Medication Sig   acetaminophen (TYLENOL) 500 MG tablet Take 1,000 mg by mouth 2 (two) times daily as needed for mild pain or headache.   albuterol (VENTOLIN HFA) 108 (90 Base) MCG/ACT inhaler Inhale 1-2 puffs into  the lungs every 6 (six) hours as needed for wheezing or shortness of breath. (Patient taking differently: Inhale 2 puffs into the lungs every 6 (six) hours as needed for wheezing or shortness of breath.)   arformoterol (BROVANA) 15 MCG/2ML NEBU USE 1 VIAL  IN  NEBULIZER TWICE  DAILY - morning and evening   aspirin EC 81 MG tablet Take 81 mg by mouth daily. Swallow whole.   bisacodyl (DULCOLAX)  10 MG suppository If not relieved by MOM, give 10 mg Bisacodyl suppositiory rectally X 1 dose in 24 hours as needed   brimonidine (ALPHAGAN) 0.2 % ophthalmic solution Place 1 drop into the left eye 2 (two) times daily.   budesonide (PULMICORT) 0.25 MG/2ML nebulizer solution Take 0.25 mg by nebulization 2 (two) times daily. 1 VIAL   Calcium Carbonate-Vitamin D 600-200 MG-UNIT TABS Take 1 tablet by mouth every evening.   carboxymethylcellulose (REFRESH PLUS) 0.5 % SOLN Place 1 drop into both eyes 3 (three) times daily as needed (dry eyes).   docusate sodium (COLACE) 100 MG capsule Take 100 mg by mouth daily.   estradiol (ESTRACE) 0.1 MG/GM vaginal cream Place 1 Applicatorful vaginally every other day.    ezetimibe (ZETIA) 10 MG tablet Take 1 tablet (10 mg total) by mouth daily.   furosemide (LASIX) 20 MG tablet Take 1 tablet (20 mg total) by mouth daily. Take extra 1 tablet by mouth daily as needed for edema.   losartan (COZAAR) 50 MG tablet Take 1 tablet (50 mg total) by mouth daily.   magnesium hydroxide (MILK OF MAGNESIA) 400 MG/5ML suspension If no BM in 3 days, give 30 cc Milk of Magnesium p.o. x 1 dose in 24 hours as needed   metFORMIN (GLUCOPHAGE) 500 MG tablet Take 1 tablet (500 mg total) by mouth daily with supper.   METOPROLOL TARTRATE PO Take by mouth. 25 MG TAB TAKE 1/2 TABLET (12.5MG ) BY MOUTH TWICE DAILY FOR TACHYCARDIA   Multiple Vitamins-Minerals (EYE VITAMINS PO) Take 1 tablet by mouth daily.   NON FORMULARY Diet:HH chopped/thin diet consistency   Polyethylene Glycol 3350 POWD by Does not apply route. TAKE 17GM IN 4-80z OF FLUID OF CHOICE ONCE DAILY FOR CONSTIPATION   predniSONE (DELTASONE) 5 MG tablet Take 5 mg by mouth daily.   senna (SENOKOT) 8.6 MG TABS tablet Take 2 tablets by mouth 2 (two) times daily.   Sodium Phosphates (RA SALINE ENEMA RE) If not relieved by Biscodyl suppository, give disposable Saline Enema rectally X 1 dose/24 hrs as needed. Contact MD as needed if no  results from enema   ipratropium (ATROVENT) 0.02 % nebulizer solution Take 2.5 mLs (0.5 mg total) by nebulization 4 (four) times daily.   No facility-administered encounter medications on file as of 07/13/2021.    Review of Systems  Constitutional:  Negative for appetite change, chills, fatigue and fever.  HENT:  Negative for congestion, hearing loss, rhinorrhea and sore throat.   Eyes: Negative.   Respiratory:  Positive for cough. Negative for shortness of breath and wheezing.   Cardiovascular:  Negative for chest pain, palpitations and leg swelling.  Gastrointestinal:  Negative for abdominal pain, constipation, diarrhea, nausea and vomiting.  Genitourinary:  Negative for dysuria.  Musculoskeletal:  Negative for arthralgias, back pain and myalgias.  Skin:  Negative for color change, rash and wound.  Neurological:  Negative for dizziness, weakness and headaches.  Psychiatric/Behavioral:  Negative for behavioral problems. The patient is not nervous/anxious.       Immunization History  Administered  Date(s) Administered   Fluad Quad(high Dose 65+) 04/07/2020   Influenza Split 04/14/2018, 03/16/2019, 04/07/2019   Influenza, High Dose Seasonal PF 04/14/2018, 05/01/2019   Influenza-Unspecified 04/27/2021   Moderna SARS-COV2 Booster Vaccination 02/10/2021   Moderna Sars-Covid-2 Vaccination 08/16/2019, 09/13/2019, 05/22/2020   Pneumococcal Conjugate-13 11/15/2016, 02/05/2018   Tdap 07/27/2018   Zoster Recombinat (Shingrix) 07/14/2018, 09/14/2018   Zoster, Live 07/14/2018, 09/14/2018   Pertinent  Health Maintenance Due  Topic Date Due   INFLUENZA VACCINE  Completed   DEXA SCAN  Completed   Fall Risk 06/24/2021 06/24/2021 06/25/2021 06/25/2021 06/26/2021  Falls in the past year? - - - - -  Patient Fall Risk Level High fall risk Moderate fall risk Moderate fall risk Moderate fall risk Moderate fall risk  Patient at Risk for Falls Due to - - - - -     Vitals:   07/13/21 1057  BP:  140/86  Pulse: 72  Resp: 19  Temp: 97.9 F (36.6 C)  SpO2: 94%  Weight: 155 lb (70.3 kg)  Height: 5\' 2"  (1.575 m)   Body mass index is 28.35 kg/m.  Physical Exam Constitutional:      Appearance: Normal appearance.  HENT:     Head: Normocephalic and atraumatic.     Nose: Nose normal. No congestion.     Mouth/Throat:     Mouth: Mucous membranes are moist.  Eyes:     Conjunctiva/sclera: Conjunctivae normal.  Cardiovascular:     Rate and Rhythm: Normal rate and regular rhythm.  Pulmonary:     Effort: Pulmonary effort is normal.     Breath sounds: Normal breath sounds.     Comments: Has O2 @ 4L/min via [place] continuously Abdominal:     General: Bowel sounds are normal.     Palpations: Abdomen is soft.  Musculoskeletal:        General: Normal range of motion.     Cervical back: Normal range of motion.     Right lower leg: No edema.     Left lower leg: No edema.  Skin:    General: Skin is warm and dry.  Neurological:     General: No focal deficit present.     Mental Status: She is alert and oriented to person, place, and time.  Psychiatric:        Mood and Affect: Mood normal.        Behavior: Behavior normal.        Thought Content: Thought content normal.        Judgment: Judgment normal.       Labs reviewed: Recent Labs    06/15/21 0237 06/19/21 1415 06/19/21 1419 06/19/21 1505 06/23/21 1204 06/23/21 1523 06/25/21 0258 07/04/21 0000  NA 139   < >  --    < > 134* 133* 132* 140  K 4.2   < >  --    < > 5.4* 5.0 3.8 4.5  CL 93*   < >  --    < > 93* 92* 89* 93*  CO2 39*   < >  --    < > 36* 31 35*  --   GLUCOSE 122*   < >  --    < > 160* 125* 100*  --   BUN 17   < >  --    < > 21 22 13 16   CREATININE 0.86   < >  --    < > 0.76 0.68 0.68 0.7  CALCIUM 8.9   < >  --    < >  8.4* 8.6* 8.6* 9.2  MG 1.7  --  1.8  --   --   --   --   --    < > = values in this interval not displayed.   Recent Labs    06/19/21 1415 06/23/21 1204  AST 34 27  ALT 30 30  ALKPHOS  50 41  BILITOT 0.9 0.7  PROT 6.7 5.4*  ALBUMIN 3.8 3.0*   Recent Labs    06/05/21 1054 06/14/21 1437 06/15/21 0237 06/19/21 1415 06/19/21 1505 06/23/21 0145 06/23/21 1204 06/25/21 0258 07/04/21 0000  WBC 9.3  --  11.8* 11.4*   < > 10.5 12.0* 9.0 6.2  NEUTROABS 7.8*  --  9.9* 10.3*  --   --   --   --   --   HGB 11.1*   < > 10.2* 11.5*   < > 9.6* 9.9* 9.5* 9.5*  HCT 34.2*   < > 33.1* 35.1*   < > 30.6* 31.9* 29.6* 29*  MCV 98.2  --  104.7* 98.0   < > 102.7* 103.9* 102.4*  --   PLT 226.0  --  213 278   < > 237 234 230 234   < > = values in this interval not displayed.   Lab Results  Component Value Date   TSH 1.49 07/10/2021   Lab Results  Component Value Date   HGBA1C 5.5 07/10/2021   No results found for: CHOL, HDL, LDLCALC, LDLDIRECT, TRIG, CHOLHDL  Significant Diagnostic Results in last 30 days:  DG Chest 2 View  Result Date: 06/23/2021 CLINICAL DATA:  Hemoptysis EXAM: CHEST - 2 VIEW COMPARISON:  06/19/2021 FINDINGS: Left lower lobe infiltrate unchanged. Mild right lower lobe airspace disease unchanged Apical emphysema.  No pneumothorax or heart failure. IMPRESSION: Bibasilar airspace disease left greater than right unchanged. Electronically Signed   By: Marlan Palau M.D.   On: 06/23/2021 09:48   CT Angio Chest PE W and/or Wo Contrast  Result Date: 06/19/2021 CLINICAL DATA:  Hemoptysis since recent cardiac catheterization and pacemaker removal last week. Chest soreness. EXAM: CT ANGIOGRAPHY CHEST WITH CONTRAST TECHNIQUE: Multidetector CT imaging of the chest was performed using the standard protocol during bolus administration of intravenous contrast. Multiplanar CT image reconstructions and MIPs were obtained to evaluate the vascular anatomy. CONTRAST:  47mL OMNIPAQUE IOHEXOL 350 MG/ML SOLN COMPARISON:  Plain film of earlier in the day. CTA chest 06/11/2021 FINDINGS: Cardiovascular: The quality of this exam for evaluation of pulmonary embolism is good. Mild motion  degradation. No evidence of pulmonary embolism. Apparent vascular filling defect on 331/7 in the right lower lobe is secondary to fluid within the endobronchial tree and is similar to on the prior. Aortic atherosclerosis. Tortuous thoracic aorta. Normal heart size, without pericardial effusion. Three vessel coronary artery calcification. Mediastinum/Nodes: No mediastinal or hilar adenopathy. Lungs/Pleura: No pleural fluid.  Advanced bullous emphysema. Bibasilar dependent airspace and less so ground-glass opacity, new since the prior CT. Upper Abdomen: Normal imaged portions of the liver, spleen, stomach pancreas, adrenal glands. Upper pole left renal 1.2 cm incompletely imaged low-density lesion is likely a cyst. Musculoskeletal: No acute osseous abnormality. Review of the MIP images confirms the above findings. IMPRESSION: 1. No evidence of pulmonary embolism. Mild motion degradation. 2. New bibasilar airspace and less so ground-glass opacity, favored to represent aspiration or infection. 3. Aortic Atherosclerosis (ICD10-I70.0) and Emphysema (ICD10-J43.9). Coronary artery atherosclerosis. Electronically Signed   By: Jeronimo Greaves M.D.   On: 06/19/2021 16:13   CARDIAC CATHETERIZATION  Result Date: 06/14/2021   Dist LAD lesion is 30% stenosed.   Mid RCA lesion is 40% stenosed.   LV end diastolic pressure is normal.   There is no aortic valve stenosis. Nonobstructive CAD Normal LV filling pressures with EDP 15 mm Hg Normal RA pressure. Mildly elevated RV pressure 48/5 mm Hg. Unable to measure PA pressure due to development of complete heart block. 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 Plan: will admit to unit for continued monitoring. Continue transvenous pacing. Hold beta blocker.   DG Chest Portable 1 View  Result Date: 06/19/2021 CLINICAL DATA:  Hemoptysis x Friday, pt mentioned having a cath done on 12/1 Tech wore surgical mask/gloves, pt had on maskhemoptysis EXAM:  PORTABLE CHEST 1 VIEW COMPARISON:  None. FINDINGS: Normal mediastinum and cardiac silhouette. Normal pulmonary vasculature. No evidence of effusion, infiltrate, or pneumothorax. No acute bony abnormality. IMPRESSION: No acute cardiopulmonary process. Electronically Signed   By: Genevive Bi M.D.   On: 06/19/2021 14:22   DG CHEST PORT 1 VIEW  Result Date: 06/14/2021 CLINICAL DATA:  Shortness of breath. EXAM: PORTABLE CHEST 1 VIEW COMPARISON:  Chest x-ray 01/12/2020. FINDINGS: The aorta is ectatic. Cardiac silhouette is within normal limits. There hazy, patchy and interstitial opacities in the bilateral lower lobes, left greater than right. There is no pleural effusion or pneumothorax. No acute fractures are seen. IMPRESSION: 1. Bibasilar infiltrates, left greater than right. Findings may be related to infection and or edema. Electronically Signed   By: Darliss Cheney M.D.   On: 06/14/2021 19:08    Assessment/Plan  1. Acute on chronic respiratory failure with hypoxia (HCC) -  continue O2 @ 4-5L/min continuously - albuterol (VENTOLIN HFA) 108 (90 Base) MCG/ACT inhaler; Inhale 2 puffs into the lungs every 6 (six) hours as needed for wheezing or shortness of breath.  Dispense: 6.7 g; Refill: 1  2. Hemoptysis -   CT angio of the chest showed groundglass opacities and was treated with antibiotics x5 days -   Continue prednisone  3. COPD with chronic bronchitis and emphysema (HCC) - budesonide (PULMICORT) 0.25 MG/2ML nebulizer solution; Take 2 mLs (0.25 mg total) by nebulization 2 (two) times daily. 1 VIAL  Dispense: 60 mL; Refill: 1 - arformoterol (BROVANA) 15 MCG/2ML NEBU; USE 1 VIAL  IN  NEBULIZER TWICE  DAILY - morning and evening  Dispense: 120 mL; Refill: 0 - predniSONE (DELTASONE) 5 MG tablet; Take 1 tablet (5 mg total) by mouth daily.  Dispense: 30 tablet; Refill: 0 - ipratropium (ATROVENT) 0.02 % nebulizer solution; Take 2.5 mLs (0.5 mg total) by nebulization 4 (four) times daily.  Dispense:  300 mL; Refill: 0  4. Type 2 diabetes mellitus with other specified complication, without long-term current use of insulin (HCC) - metFORMIN (GLUCOPHAGE) 500 MG tablet; Take 1 tablet (500 mg total) by mouth daily with supper.  Dispense: 30 tablet; Refill: 0  5. Essential hypertension - losartan (COZAAR) 50 MG tablet; Take 1 tablet (50 mg total) by mouth daily.  Dispense: 30 tablet; Refill: 0  6. Tachycardia - metoprolol tartrate (LOPRESSOR) 25 MG tablet; Take 0.5 tablets (12.5 mg total) by mouth 2 (two) times daily. 25 MG TAB TAKE 1/2 TABLET (12.5MG ) BY MOUTH TWICE DAILY FOR TACHYCARDIA  Dispense: 30 tablet; Refill: 0  7. Hyperlipidemia, unspecified hyperlipidemia type - ezetimibe (ZETIA) 10 MG tablet; Take 1 tablet (10 mg total) by mouth daily.  Dispense: 30 tablet; Refill: 0  8. Chronic systolic heart failure (HCC) -  furosemide (LASIX) 20 MG tablet; Take 1 tablet (20 mg total) by mouth daily. Take extra 1 tablet by mouth daily as needed for edema.  Dispense: 45 tablet; Refill: 0      I have filled out patient's discharge paperwork and e-prescribed medications.  Patient will have home health PT and OT.  DME provided:  None  Total discharge time: Greater than 30 minutes Greater than 50% was spent in counseling and coordination of care.    Discharge time involved coordination of the discharge process with social worker, nursing staff and therapy department. Medical justification for home health services verified.    Kenard Gower, DNP, MSN, FNP-BC [facility] [phone] (Monday-Friday 8:00 a.m. - 5:00 p.m.) [phone] (after hours)

## 2021-07-13 NOTE — Progress Notes (Signed)
Cardiology Office Note:    Date:  07/17/2021   ID:  Artist Pais, DOB 1941-01-08, MRN [id number]  PCP:  London Pepper, MD   [facility]  Cardiologist:  Freada Bergeron, MD  Advanced Practice Provider:  No care team member to display Electrophysiologist:  None    Referring MD: London Pepper, MD    History of Present Illness:    Rebecca [name] is a 80 y.o. 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  Initially seen on 07/27/20. Patient reported that she has a history of viral cardiomyopathy in 2010 for which she was followed by Cardiology with serial TTEs. She was told by her Cardiologist in [place] that her EF recovered and she did not need to follow up further. During our visit, she was complaining of worsening DOE worse than her chronic SOB associated with her COPD for which she is on continuous O2. We obtained a TTE which revealed LVEF 40-45% with anteroseptal, mid inferoseptal and basal inferoseptal hypokinesis. Follow-up SPECT showed no evidence of ischemia or infarction but abnormal septal motion consistent with LBBB.   Seen in clinic on 10/31/20 where she continued to have DOE as well as elevated blood pressure. We discussed the option of cath vs medical optimization and she wanted to pursue medication at that time.  We increased losartan to 100mg  daily and started spiro at that time.  Was admitted 06/14/21-06/15/21 for planned cath due to persistent dyspnea on exertion despite treatment of COPD, known WMA and depressed EF. She underwent LHC/RHC which showed nonobstructive CAD, LVEDP 84mmHg, RV 48/5. Cath course complicated by CHB in the setting of known LBBB requiring transvenous pacing. This resolved and the patient was discharged home off BB.  Last seen in clinic on 06/19/21 by Richardson Dopp. At that time she was more SOB and  coughing up blood clots. CT 06/11/21 without PE. She then was referred to the ER for further management.   The patient was admitted to [facility] from 06/19/21-06/26/21. Seen by Pulm and thought secondary to pneumonic infectious process. She was placed on ABX. Repeat CTA negative for PE but showed ground glass opacities.  Today, the patient states that she just got home from SNF two days ago. Overall, she is feeling better, but not back to baseline. She continues to have a mild cough that is intermittently productive. Currently on 4.5L of oxygen at home. She resumed her home coreg as her heart rates were going to 120s and she felt more SOB with the racing heart. No episodes of lightheadedness, dizziness or syncope. She has noted bilateral LE edema since her hospitalization. Has been taking lasix 20mg  daily.  Past Medical History:  Diagnosis Date   Congestive heart disease ([place])    COPD (chronic obstructive pulmonary disease) ([place])    Diabetes ([place]) with atherosclerosis    Hyperlipemia    Osteoporosis    Primary hypertension     Past Surgical History:  Procedure Laterality Date   RIGHT/LEFT HEART CATH AND CORONARY ANGIOGRAPHY N/A 06/14/2021   Procedure: RIGHT/LEFT HEART CATH AND CORONARY ANGIOGRAPHY;  Surgeon: [name], [name], MD;  Location: [facility];  Service: Cardiovascular;  Laterality: N/A;   TEMPORARY PACEMAKER N/A 06/14/2021   Procedure: TEMPORARY PACEMAKER;  Surgeon: [name], [name], MD;  Location: [facility];  Service: Cardiovascular;  Laterality: N/A;  Current Medications: Current Meds  Medication Sig   acetaminophen (TYLENOL) 500 MG tablet Take 1,000 mg by mouth 2 (two) times daily as needed for mild pain or headache.   albuterol (VENTOLIN HFA) 108 (90 Base) MCG/ACT inhaler Inhale 2 puffs into the lungs every 6 (six) hours as needed for wheezing or shortness of breath.   arformoterol (BROVANA) 15 MCG/2ML NEBU USE 1 VIAL  IN  NEBULIZER TWICE  DAILY - morning and evening    aspirin EC 81 MG tablet Take 81 mg by mouth daily. Swallow whole.   bisacodyl (DULCOLAX) 10 MG suppository If not relieved by MOM, give 10 mg Bisacodyl suppositiory rectally X 1 dose in 24 hours as needed   brimonidine (ALPHAGAN) 0.2 % ophthalmic solution Place 1 drop into the left eye 2 (two) times daily.   budesonide (PULMICORT) 0.25 MG/2ML nebulizer solution Take 2 mLs (0.25 mg total) by nebulization 2 (two) times daily. 1 VIAL   Calcium Carbonate-Vitamin D 600-200 MG-UNIT TABS Take 1 tablet by mouth every evening.   carboxymethylcellulose (REFRESH PLUS) 0.5 % SOLN Place 1 drop into both eyes 3 (three) times daily as needed (dry eyes).   carvedilol (COREG) 25 MG tablet Take 25 mg by mouth 2 (two) times daily with a meal.   docusate sodium (COLACE) 100 MG capsule Take 100 mg by mouth daily.   estradiol (ESTRACE) 0.1 MG/GM vaginal cream Place 1 Applicatorful vaginally every other day.   ezetimibe (ZETIA) 10 MG tablet Take 1 tablet (10 mg total) by mouth daily.   furosemide (LASIX) 20 MG tablet Take 2 tablets (40 mg total) by mouth x 3 days, then decrease to taking 1 tablet (20 mg total) by mouth daily thereafter.   ipratropium (ATROVENT) 0.02 % nebulizer solution Take 2.5 mLs (0.5 mg total) by nebulization 4 (four) times daily.   ivabradine (CORLANOR) 5 MG TABS tablet Take 1 tablet (5 mg total) by mouth 2 (two) times daily with a meal.   losartan (COZAAR) 50 MG tablet Take 1 tablet (50 mg total) by mouth daily.   magnesium hydroxide (MILK OF MAGNESIA) 400 MG/5ML suspension If no BM in 3 days, give 30 cc Milk of Magnesium p.o. x 1 dose in 24 hours as needed   metFORMIN (GLUCOPHAGE) 500 MG tablet Take 1 tablet (500 mg total) by mouth daily with supper.   Multiple Vitamins-Minerals (EYE VITAMINS PO) Take 1 tablet by mouth daily.   NON FORMULARY Diet:HH chopped/thin diet consistency   Polyethylene Glycol 3350 POWD by Does not apply route. TAKE 17GM IN 4-80z OF FLUID OF CHOICE ONCE DAILY FOR  CONSTIPATION   potassium chloride (KLOR-CON M) 10 MEQ tablet Take 2 tablets (20 mEq total) by mouth x 3 days, then decrease to taking 1 tablet (10 mEq total) by mouth daily thereafter.   predniSONE (DELTASONE) 5 MG tablet Take 1 tablet (5 mg total) by mouth daily.   senna (SENOKOT) 8.6 MG TABS tablet Take 2 tablets by mouth 2 (two) times daily.   Sodium Phosphates (RA SALINE ENEMA RE) If not relieved by Biscodyl suppository, give disposable Saline Enema rectally X 1 dose/24 hrs as needed. Contact MD as needed if no results from enema   [DISCONTINUED] furosemide (LASIX) 20 MG tablet Take 1 tablet (20 mg total) by mouth daily. Take extra 1 tablet by mouth daily as needed for edema.     Allergies:   Gabapentin, Pravastatin sodium, Rosuvastatin calcium, Simvastatin, Amoxicillin, Lisinopril, Sulfacetamide, and Tetracyclines & related   Social History  Socioeconomic History   Marital status: Widowed    Spouse name: Not on file   Number of children: Not on file   Years of education: Not on file   Highest education level: Not on file  Occupational History   Not on file  Tobacco Use   Smoking status: Former    Packs/day: 1.50    Years: 40.00    Pack years: 60.00    Types: Cigarettes    Quit date: 2003    Years since quitting: 20.0   Smokeless tobacco: Never  Vaping Use   Vaping Use: Never used  Substance and Sexual Activity   Alcohol use: Yes    Alcohol/week: 1.0 standard drink    Types: 1 Glasses of wine per week   Drug use: Never   Sexual activity: Not on file  Other Topics Concern   Not on file  Social History Narrative   Not on file   Social Determinants of Health   Financial Resource Strain: Not on file  Food Insecurity: Not on file  Transportation Needs: Not on file  Physical Activity: Not on file  Stress: Not on file  Social Connections: Not on file     Family History: The patient's family history is not on file.  ROS:   Please see the history of present  illness. Review of Systems  Constitutional:  Positive for malaise/fatigue. Negative for chills and fever.  HENT:  Negative for sore throat.   Eyes:  Negative for blurred vision.  Respiratory:  Positive for sputum production and shortness of breath.   Cardiovascular:  Positive for palpitations and leg swelling. Negative for chest pain, orthopnea, claudication and PND.  Gastrointestinal:  Negative for nausea and vomiting.  Musculoskeletal:  Positive for joint pain. Negative for falls.  Neurological:  Negative for dizziness and loss of consciousness.  Psychiatric/Behavioral:  Negative for substance abuse.     EKGs/Labs/Other Studies Reviewed:    The following studies were reviewed today: CTA 07-05-2021: FINDINGS: Cardiovascular: The quality of this exam for evaluation of pulmonary embolism is good. Mild motion degradation. No evidence of pulmonary embolism. Apparent vascular filling defect on 331/7 in the right lower lobe is secondary to fluid within the endobronchial tree and is similar to on the prior.   Aortic atherosclerosis. Tortuous thoracic aorta. Normal heart size, without pericardial effusion. Three vessel coronary artery calcification.   Mediastinum/Nodes: No mediastinal or hilar adenopathy.   Lungs/Pleura: No pleural fluid.  Advanced bullous emphysema.   Bibasilar dependent airspace and less so ground-glass opacity, new since the prior CT.   Upper Abdomen: Normal imaged portions of the liver, spleen, stomach pancreas, adrenal glands. Upper pole left renal 1.2 cm incompletely imaged low-density lesion is likely a cyst.   Musculoskeletal: No acute osseous abnormality.   Review of the MIP images confirms the above findings.   IMPRESSION: 1. No evidence of pulmonary embolism. Mild motion degradation. 2. New bibasilar airspace and less so ground-glass opacity, favored to represent aspiration or infection. 3. Aortic Atherosclerosis (ICD10-I70.0) and Emphysema  (ICD10-J43.9). Coronary artery atherosclerosis.    LHC/RHC 06/15/21: Right and left heart cath 06/14/21:   Dist LAD lesion is 30% stenosed.   Mid RCA lesion is 40% stenosed.   LV end diastolic pressure is normal.   There is no aortic valve stenosis.   Nonobstructive CAD Normal LV filling pressures with EDP 15 mm Hg Normal RA pressure. Mildly elevated RV pressure 48/5 mm Hg. Unable to measure PA pressure due to development of complete  heart block. 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   Plan: will admit to unit for continued monitoring. Continue transvenous pacing. Hold beta blocker.   TTE 08/18/20: IMPRESSIONS   1. Left ventricular ejection fraction, by estimation, is 40 to 45%. The  left ventricle has mildly decreased function. The left ventricle  demonstrates regional wall motion abnormalities (see scoring  diagram/findings for description). Left ventricular  diastolic parameters are consistent with Grade I diastolic dysfunction  (impaired relaxation).   2. Right ventricular systolic function is normal. The right ventricular  size is normal.   3. The mitral valve is normal in structure. No evidence of mitral valve  regurgitation.   4. The aortic valve is grossly normal. Aortic valve regurgitation is not  visualized.   FINDINGS   Left Ventricle: Left ventricular ejection fraction, by estimation, is 40  to 45%. The left ventricle has mildly decreased function. The left  ventricle demonstrates regional wall motion abnormalities. The left  ventricular internal cavity size was small.  There is no left ventricular hypertrophy. Left ventricular diastolic  parameters are consistent with Grade I diastolic dysfunction (impaired  relaxation).      LV Wall Scoring:  The anterior septum, mid inferoseptal segment, and basal inferoseptal  segment  are hypokinetic.   Right Ventricle: The right ventricular size is normal. No increase in  right  ventricular wall thickness. Right ventricular systolic function is  normal.   Left Atrium: Left atrial size was normal in size.   Right Atrium: Right atrial size was not well visualized.   Pericardium: Trivial pericardial effusion is present.   Mitral Valve: The mitral valve is normal in structure. No evidence of  mitral valve regurgitation.   Tricuspid Valve: The tricuspid valve is normal in structure. Tricuspid  valve regurgitation is not demonstrated.   Aortic Valve: The aortic valve is grossly normal. There is mild aortic  valve annular calcification. Aortic valve regurgitation is not visualized.   Pulmonic Valve: The pulmonic valve was not well visualized. Pulmonic valve  regurgitation is not visualized.   Aorta: The aortic root is normal in size and structure and the ascending  aorta was not well visualized.   IAS/Shunts: The interatrial septum is aneurysmal. The atrial septum is  grossly normal.   Myoview 08/07/20: The left ventricular ejection fraction is mildly decreased (45-54%). Nuclear stress EF: 46%. There was no ST segment deviation noted during stress. No T wave inversion was noted during stress. The study is normal. This is a low risk study.   1. There are reduced counts in the septal segments on rest imaging that improve on stress imaging with normal wall motion consistent with left bundle branch block artifact. No evidence of ischemia or infarction.  2. Mildly reduced LVEF, 46%. Septal movement consistent with LBBB. 3. This is an low- to intermediate-risk study.   CTA chest 01/12/20: FINDINGS: Cardiovascular: Satisfactory opacification of the pulmonary arteries to the segmental level. No evidence of pulmonary embolism. Normal heart size. No pericardial effusion. Coronary artery calcifications are noted. Atherosclerosis of thoracic aorta is noted without aneurysm formation.   Mediastinum/Nodes: Small sliding-type hiatal hernia is noted. No adenopathy  is noted. Thyroid gland is unremarkable.   Lungs/Pleura: No pneumothorax or pleural effusion is noted. 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 Mild bilateral posterior basilar subsegmental atelectasis is noted.   Upper Abdomen: Bilateral nephrolithiasis is noted.   Musculoskeletal: No chest wall abnormality. No acute or significant osseous findings.  Review of the MIP images confirms the above findings.   IMPRESSION: 1. No definite evidence of pulmonary embolus. 2. Coronary artery calcifications are noted suggesting coronary artery disease. 3. Small sliding-type hiatal hernia. 4. Bilateral nephrolithiasis.  EKG:   No ECG done today  Recent Labs: 04/10/2021: NT-Pro BNP 179 06/19/2021: Magnesium 1.8 06/23/2021: ALT 30; B Natriuretic Peptide 50.3 07/04/2021: BUN 16; Creatinine 0.7; Hemoglobin 9.5; Platelets 234; Potassium 4.5; Sodium 140 07/10/2021: TSH 1.49  Recent Lipid Panel No results found for: CHOL, TRIG, HDL, CHOLHDL, VLDL, LDLCALC, LDLDIRECT   Risk Assessment/Calculations:       Physical Exam:    VS:  BP (!) 148/78    Pulse 89    Ht 5\' 2"  (1.575 m)    Wt 154 lb 9.6 oz (70.1 kg)    SpO2 93%    BMI 28.28 kg/m     Wt Readings from Last 3 Encounters:  07/17/21 154 lb 9.6 oz (70.1 kg)  07/13/21 155 lb (70.3 kg)  07/06/21 156 lb (70.8 kg)     GEN: Comfortable, O2 in place, in wheel chair HEENT: Normal NECK: No JVD; No carotid bruits CARDIAC: Distant, regular, no murmurs RESPIRATORY:  Diminished but clear, no wheezing ABDOMEN: Soft, non-tender, non-distended MUSCULOSKELETAL:  trace-1+ LE edema to the mid-shin. Warm. SKIN: Warm and dry NEUROLOGIC:  Alert and oriented x 3 PSYCHIATRIC:  Normal affect   ASSESSMENT:    1. HFrEF (heart failure with reduced ejection fraction) (HCC)   2. Chronic systolic heart failure (HCC)   3. Medication management   4. Coronary artery disease involving native coronary artery of native heart without angina pectoris    5. Heart block AV complete (HCC)   6. Essential hypertension   7. Hemoptysis   8. Left bundle branch block   9. Complete heart block (HCC)     PLAN:    In order of problems listed above:  #Chronic Systolic Heart Failure with LVEF 40-45% #History of viral cardiomyopathy #Wall Motion Abnormalities on TTE; no obstructive disease on cath Patient with reported history of viral cardiomyopathy diagnosed in [place]. Cath in 2010 performed that was reportedly normal. Was told that her pumping function recovered and she did not need to follow with Cardiology in [place]. During visit on 07/2020, she complained of worsening SOB, DOE, and LE edema. TTE showed LVEF 40-45% with septal and basal inferior wall hypokinesis. Myoview without ischemia or infarction. She initially declined cath but due to persistence of symptoms, she changed her mind and underwent RHC/LHC in 06/2021. As detailed above, cath with nonobstructive CAD. RHC with mildly elevated RV systolic pressures. -Increase lasix to 40mg  PO BID x3 days with K 07/2021 and then 20mg  daily (without K as had hyperK in the past)  -BMET in 7 days -Continue losartan 50mg  daily -Start corlenor 5mg  BID given episode of CHB during hospitalization -If unable to afford corlenor, can plan for low dose metoprolol and monitoring for recurrence of CHB -No SGLT-2i due to frequent UTIs -Off spiro due to hyperK -Low Na diet  #Nonobstuctive CAD: -Continue zetia 10mg  daily -Can refer to lipid clinic for consideration of PCSK9i if patient amenable at next visit  #Hemoptysis: Resolved. Thought to be due to pneumonia. CTA 06/19/21 without PE.  #Chronic hypoxic respiratory failure #COPD: Follows closely with pulm. -Follow-up with pulm as scheduled -Continue supplemental O2   #HTN Elevated today.  -Off coreg due to CHB during hospitalization -Continue losartan 50mg  daily  -Off spiro due to hyperK -Monitor blood pressures with  goal <120s/80s; if not  at goal at home, will increase losartan   #LBBB: #Transient CHB: Patient with chronic LBBB and transient episode of CHB following LHC in 06/2021. Required temporary pacing. LHC without obstructive disease. Off BB. -Off BB -No obstructive disease on cath -Will continue to monitor EF with serial TTEs   #HLD Managed by PCP. Intolerant to crestor and simva -Continue zetia 10mg  daily -Will check if wants to be referred to lipid clinic for consideration of PCSK9i   #DMII -Continue metformin -No SGLT2i due to frequent UTI    Medication Adjustments/Labs and Tests Ordered: Current medicines are reviewed at length with the patient today.  Concerns regarding medicines are outlined above.  Orders Placed This Encounter  Procedures   Basic metabolic panel    Meds ordered this encounter  Medications   furosemide (LASIX) 20 MG tablet    Sig: Take 2 tablets (40 mg total) by mouth x 3 days, then decrease to taking 1 tablet (20 mg total) by mouth daily thereafter.    Dispense:  36 tablet    Refill:  0   potassium chloride (KLOR-CON M) 10 MEQ tablet    Sig: Take 2 tablets (20 mEq total) by mouth x 3 days, then decrease to taking 1 tablet (10 mEq total) by mouth daily thereafter.    Dispense:  36 tablet    Refill:  0   ivabradine (CORLANOR) 5 MG TABS tablet    Sig: Take 1 tablet (5 mg total) by mouth 2 (two) times daily with a meal.    Dispense:  60 tablet    Refill:  2     Patient Instructions  Medication Instructions:   START TAKING LASIX 40 MG BY MOUTH DAILY FOR 3 DAYS ONLY, THEN DECREASE TO TAKING 20 MG BY MOUTH DAILY THEREAFTER.   START TAKING POTASSIUM CHLORIDE 20 mEq BY MOUTH FOR 3 DAYS ONLY (IN CONCURRENT USE WITH LASIX), THEN DECREASE TO TAKING 10 mEq BY MOUTH DAILY THEREAFTER.   START TAKING CORLANOR 5 MG BY MOUTH TWICE DAILY   *If you need a refill on your cardiac medications before your next appointment, please call your pharmacy*   Lab Work:  IN [facility]  OFFICE--CHECK BMET  If you have labs (blood work) drawn today and your tests are completely normal, you will receive your results only by: [place] (if you have MyChart) OR A paper copy in the mail If you have any lab test that is abnormal or we need to change your treatment, we will call you to review the results.   Follow-Up:  2 MONTHS IN THE OFFICE WITH AN EXTENDER      Follow-up in 4-6 months.    Signed, Freada Bergeron, MD  07/17/2021 1:27 PM    [facility]

## 2021-07-14 MED ORDER — LOSARTAN POTASSIUM 50 MG PO TABS: 50.0000 mg | ORAL_TABLET | Freq: Every day | ORAL | 0 refills | Status: AC

## 2021-07-14 MED ORDER — METOPROLOL TARTRATE 25 MG PO TABS: 12.5000 mg | ORAL_TABLET | Freq: Two times a day (BID) | ORAL | 0 refills | Status: DC

## 2021-07-14 MED ORDER — EZETIMIBE 10 MG PO TABS
10.0000 mg | ORAL_TABLET | Freq: Every day | ORAL | 0 refills | Status: DC
Start: 2021-07-14 — End: 2021-10-19

## 2021-07-14 MED ORDER — ALBUTEROL SULFATE HFA 108 (90 BASE) MCG/ACT IN AERS: 2.0000 | INHALATION_SPRAY | Freq: Four times a day (QID) | RESPIRATORY_TRACT | 1 refills | Status: AC | PRN

## 2021-07-14 MED ORDER — BRIMONIDINE TARTRATE 0.2 % OP SOLN: 1.0000 [drp] | Freq: Two times a day (BID) | OPHTHALMIC | 0 refills | Status: AC

## 2021-07-14 MED ORDER — PREDNISONE 5 MG PO TABS: 5.0000 mg | ORAL_TABLET | Freq: Every day | ORAL | 0 refills | Status: DC

## 2021-07-14 MED ORDER — ARFORMOTEROL TARTRATE 15 MCG/2ML IN NEBU: INHALATION_SOLUTION | RESPIRATORY_TRACT | 0 refills | Status: DC

## 2021-07-14 MED ORDER — IPRATROPIUM BROMIDE 0.02 % IN SOLN
0.5000 mg | Freq: Four times a day (QID) | RESPIRATORY_TRACT | 0 refills | Status: DC
Start: 2021-07-14 — End: 2021-08-21

## 2021-07-14 MED ORDER — METFORMIN HCL 500 MG PO TABS: 500.0000 mg | ORAL_TABLET | Freq: Every day | ORAL | 0 refills | Status: AC

## 2021-07-14 MED ORDER — FUROSEMIDE 20 MG PO TABS
20.0000 mg | ORAL_TABLET | Freq: Every day | ORAL | 0 refills | Status: DC
Start: 2021-07-14 — End: 2021-07-17

## 2021-07-14 MED ORDER — BUDESONIDE 0.25 MG/2ML IN SUSP: 0.2500 mg | Freq: Two times a day (BID) | RESPIRATORY_TRACT | 1 refills | Status: DC

## 2021-07-14 MED ORDER — ESTRADIOL 0.1 MG/GM VA CREA: 1.0000 | TOPICAL_CREAM | VAGINAL | 1 refills | Status: DC

## 2021-07-16 DIAGNOSIS — M6281 Muscle weakness (generalized): Secondary | ICD-10-CM | POA: Diagnosis not present

## 2021-07-17 ENCOUNTER — Encounter: Payer: Self-pay | Admitting: Cardiology

## 2021-07-17 ENCOUNTER — Ambulatory Visit: Payer: Medicare HMO | Admitting: Cardiology

## 2021-07-17 ENCOUNTER — Other Ambulatory Visit: Payer: Self-pay

## 2021-07-17 VITALS — BP 148/78 | HR 89 | Ht 62.0 in | Wt 154.6 lb

## 2021-07-17 DIAGNOSIS — Z79899 Other long term (current) drug therapy: Secondary | ICD-10-CM | POA: Diagnosis not present

## 2021-07-17 DIAGNOSIS — I442 Atrioventricular block, complete: Secondary | ICD-10-CM

## 2021-07-17 DIAGNOSIS — I2510 Atherosclerotic heart disease of native coronary artery without angina pectoris: Secondary | ICD-10-CM | POA: Diagnosis not present

## 2021-07-17 DIAGNOSIS — I10 Essential (primary) hypertension: Secondary | ICD-10-CM | POA: Diagnosis not present

## 2021-07-17 DIAGNOSIS — I5022 Chronic systolic (congestive) heart failure: Secondary | ICD-10-CM

## 2021-07-17 DIAGNOSIS — I5020 Unspecified systolic (congestive) heart failure: Secondary | ICD-10-CM | POA: Diagnosis not present

## 2021-07-17 DIAGNOSIS — I447 Left bundle-branch block, unspecified: Secondary | ICD-10-CM

## 2021-07-17 DIAGNOSIS — R042 Hemoptysis: Secondary | ICD-10-CM | POA: Diagnosis not present

## 2021-07-17 MED ORDER — FUROSEMIDE 20 MG PO TABS: ORAL_TABLET | ORAL | 0 refills | Status: DC

## 2021-07-17 MED ORDER — POTASSIUM CHLORIDE CRYS ER 10 MEQ PO TBCR: EXTENDED_RELEASE_TABLET | ORAL | 0 refills | Status: DC

## 2021-07-17 MED ORDER — IVABRADINE HCL 5 MG PO TABS: 5.0000 mg | ORAL_TABLET | Freq: Two times a day (BID) | ORAL | 2 refills | Status: DC

## 2021-07-17 NOTE — Patient Instructions (Signed)
Medication Instructions:   START TAKING LASIX 40 MG BY MOUTH DAILY FOR 3 DAYS ONLY, THEN DECREASE TO TAKING 20 MG BY MOUTH DAILY THEREAFTER.   START TAKING POTASSIUM CHLORIDE 20 mEq BY MOUTH FOR 3 DAYS ONLY (IN CONCURRENT USE WITH LASIX), THEN DECREASE TO TAKING 10 mEq BY MOUTH DAILY THEREAFTER.   START TAKING CORLANOR 5 MG BY MOUTH TWICE DAILY   *If you need a refill on your cardiac medications before your next appointment, please call your pharmacy*   Lab Work:  IN 7 DAYS HERE IN THE OFFICE--CHECK BMET  If you have labs (blood work) drawn today and your tests are completely normal, you will receive your results only by: MyChart Message (if you have MyChart) OR A paper copy in the mail If you have any lab test that is abnormal or we need to change your treatment, we will call you to review the results.   Follow-Up:  2 MONTHS IN THE OFFICE WITH AN EXTENDER

## 2021-07-18 ENCOUNTER — Encounter: Payer: Self-pay | Admitting: Cardiology

## 2021-07-19 ENCOUNTER — Encounter: Payer: Self-pay | Admitting: Cardiology

## 2021-07-19 DIAGNOSIS — I5020 Unspecified systolic (congestive) heart failure: Secondary | ICD-10-CM

## 2021-07-19 DIAGNOSIS — I2510 Atherosclerotic heart disease of native coronary artery without angina pectoris: Secondary | ICD-10-CM

## 2021-07-19 DIAGNOSIS — R2689 Other abnormalities of gait and mobility: Secondary | ICD-10-CM | POA: Diagnosis not present

## 2021-07-19 DIAGNOSIS — Z79899 Other long term (current) drug therapy: Secondary | ICD-10-CM

## 2021-07-19 DIAGNOSIS — J449 Chronic obstructive pulmonary disease, unspecified: Secondary | ICD-10-CM | POA: Diagnosis not present

## 2021-07-19 DIAGNOSIS — I5022 Chronic systolic (congestive) heart failure: Secondary | ICD-10-CM

## 2021-07-19 DIAGNOSIS — I10 Essential (primary) hypertension: Secondary | ICD-10-CM

## 2021-07-19 DIAGNOSIS — M6281 Muscle weakness (generalized): Secondary | ICD-10-CM | POA: Diagnosis not present

## 2021-07-19 NOTE — Telephone Encounter (Signed)
Order for BMET placed for the pt to have drawn at our [facility] location on 07/24/21, for commute reasons.  Order placed and released in the system for [facility] to review when drawing on the pt. Requisition faxed to Primary Care at [facility] at [phone] so they can provide to [facility] tech when drawing on the pt. Pt aware via mychart message that BMET order placed and will be done at her request at [facility] location.   "Your provider has recommended lab work. Please have this collected at [facility] at [place]. The lab is open 8:00 am - 4:30 pm. Please avoid 12:00p - 1:00p for lunch hour. You do not need an appointment. Please go to [address] [place], [place] [address]. This is in the Primary Care office on the 3rd floor, let them know you are there for blood work and they will direct you to the lab."

## 2021-07-20 ENCOUNTER — Telehealth: Payer: Self-pay | Admitting: Cardiology

## 2021-07-20 DIAGNOSIS — M6281 Muscle weakness (generalized): Secondary | ICD-10-CM | POA: Diagnosis not present

## 2021-07-20 NOTE — Telephone Encounter (Signed)
Will send this information to Dr. Shari Prows as a general FYI, that pt will proceed with taking Corlanor, for it's cost effective for her.

## 2021-07-20 NOTE — Telephone Encounter (Signed)
Patient called stating the Corlandor is only a $100 copay, she can take it.

## 2021-07-23 ENCOUNTER — Encounter: Payer: Self-pay | Admitting: Cardiology

## 2021-07-23 DIAGNOSIS — M6281 Muscle weakness (generalized): Secondary | ICD-10-CM | POA: Diagnosis not present

## 2021-07-24 ENCOUNTER — Other Ambulatory Visit: Payer: Medicare HMO

## 2021-07-24 DIAGNOSIS — I5020 Unspecified systolic (congestive) heart failure: Secondary | ICD-10-CM | POA: Diagnosis not present

## 2021-07-24 DIAGNOSIS — I10 Essential (primary) hypertension: Secondary | ICD-10-CM | POA: Diagnosis not present

## 2021-07-24 DIAGNOSIS — J449 Chronic obstructive pulmonary disease, unspecified: Secondary | ICD-10-CM | POA: Diagnosis not present

## 2021-07-24 DIAGNOSIS — Z79899 Other long term (current) drug therapy: Secondary | ICD-10-CM | POA: Diagnosis not present

## 2021-07-24 DIAGNOSIS — R2689 Other abnormalities of gait and mobility: Secondary | ICD-10-CM | POA: Diagnosis not present

## 2021-07-24 DIAGNOSIS — I5022 Chronic systolic (congestive) heart failure: Secondary | ICD-10-CM | POA: Diagnosis not present

## 2021-07-24 DIAGNOSIS — I2510 Atherosclerotic heart disease of native coronary artery without angina pectoris: Secondary | ICD-10-CM | POA: Diagnosis not present

## 2021-07-24 DIAGNOSIS — M6281 Muscle weakness (generalized): Secondary | ICD-10-CM | POA: Diagnosis not present

## 2021-07-25 DIAGNOSIS — M6281 Muscle weakness (generalized): Secondary | ICD-10-CM | POA: Diagnosis not present

## 2021-07-25 DIAGNOSIS — J449 Chronic obstructive pulmonary disease, unspecified: Secondary | ICD-10-CM | POA: Diagnosis not present

## 2021-07-25 DIAGNOSIS — I428 Other cardiomyopathies: Secondary | ICD-10-CM | POA: Diagnosis not present

## 2021-07-25 DIAGNOSIS — H9193 Unspecified hearing loss, bilateral: Secondary | ICD-10-CM | POA: Diagnosis not present

## 2021-07-25 DIAGNOSIS — I509 Heart failure, unspecified: Secondary | ICD-10-CM | POA: Diagnosis not present

## 2021-07-25 LAB — BASIC METABOLIC PANEL
BUN/Creatinine Ratio: 17 (ref 12–28)
BUN: 12 mg/dL (ref 8–27)
CO2: 26 mmol/L (ref 20–29)
Calcium: 9.1 mg/dL (ref 8.7–10.3)
Chloride: 91 mmol/L — ABNORMAL LOW (ref 96–106)
Creatinine, Ser: 0.72 mg/dL (ref 0.57–1.00)
Glucose: 165 mg/dL — ABNORMAL HIGH (ref 70–99)
Potassium: 5.2 mmol/L (ref 3.5–5.2)
Sodium: 140 mmol/L (ref 134–144)
eGFR: 84 mL/min/{1.73_m2} (ref >59)

## 2021-07-26 DIAGNOSIS — R2689 Other abnormalities of gait and mobility: Secondary | ICD-10-CM | POA: Diagnosis not present

## 2021-07-26 DIAGNOSIS — M6281 Muscle weakness (generalized): Secondary | ICD-10-CM | POA: Diagnosis not present

## 2021-07-27 DIAGNOSIS — M6281 Muscle weakness (generalized): Secondary | ICD-10-CM | POA: Diagnosis not present

## 2021-07-27 DIAGNOSIS — R2689 Other abnormalities of gait and mobility: Secondary | ICD-10-CM | POA: Diagnosis not present

## 2021-07-30 DIAGNOSIS — R2689 Other abnormalities of gait and mobility: Secondary | ICD-10-CM | POA: Diagnosis not present

## 2021-07-30 DIAGNOSIS — M6281 Muscle weakness (generalized): Secondary | ICD-10-CM | POA: Diagnosis not present

## 2021-07-31 ENCOUNTER — Ambulatory Visit (INDEPENDENT_AMBULATORY_CARE_PROVIDER_SITE_OTHER): Payer: Medicare HMO

## 2021-07-31 ENCOUNTER — Ambulatory Visit: Payer: Medicare HMO | Admitting: Adult Health

## 2021-07-31 ENCOUNTER — Other Ambulatory Visit: Payer: Self-pay

## 2021-07-31 ENCOUNTER — Encounter: Payer: Self-pay | Admitting: Adult Health

## 2021-07-31 VITALS — BP 160/70 | HR 98 | Temp 98.5°F | Ht 62.0 in | Wt 155.0 lb

## 2021-07-31 DIAGNOSIS — J449 Chronic obstructive pulmonary disease, unspecified: Secondary | ICD-10-CM

## 2021-07-31 DIAGNOSIS — J439 Emphysema, unspecified: Secondary | ICD-10-CM | POA: Diagnosis not present

## 2021-07-31 DIAGNOSIS — J690 Pneumonitis due to inhalation of food and vomit: Secondary | ICD-10-CM

## 2021-07-31 DIAGNOSIS — J9611 Chronic respiratory failure with hypoxia: Secondary | ICD-10-CM

## 2021-07-31 DIAGNOSIS — R5381 Other malaise: Secondary | ICD-10-CM

## 2021-07-31 DIAGNOSIS — J9811 Atelectasis: Secondary | ICD-10-CM | POA: Diagnosis not present

## 2021-07-31 DIAGNOSIS — J189 Pneumonia, unspecified organism: Secondary | ICD-10-CM | POA: Diagnosis not present

## 2021-07-31 DIAGNOSIS — M6281 Muscle weakness (generalized): Secondary | ICD-10-CM | POA: Diagnosis not present

## 2021-07-31 DIAGNOSIS — Z7189 Other specified counseling: Secondary | ICD-10-CM | POA: Diagnosis not present

## 2021-07-31 MED ORDER — ALBUTEROL SULFATE HFA 108 (90 BASE) MCG/ACT IN AERS: 2.0000 | INHALATION_SPRAY | Freq: Four times a day (QID) | RESPIRATORY_TRACT | 3 refills | Status: DC | PRN

## 2021-07-31 NOTE — Assessment & Plan Note (Signed)
Very severe COPD with emphysema -recent flare now improving  Resume duoneb Four times a day  .  For now remain on low dose steroids on return if stable consider slow taper off.    Plan  Patient Instructions  Chest xray today .  Continue on Prednisone 5mg  daily .  Continue on Budesonide Neb Twice daily  .  Continue on Brovana Twice daily  .  Restart on Albuterol /Ipratropium Neb four times a day   Activity as tolerated Saline nasal spray Twice daily   Saline nasal gel At bedtime   Robitussin Cough syrup As needed  cough/congestion  Palliative care as discussed. Continue on PT/OT as planned.  Continue on oxygen 4l/m rest and 5l/m activity . O2 sats goal >88-90%.  Follow-up with Dr. or Salaam Battershell NP in 6-8 weeks  and As needed  Please contact office for sooner follow up if symptoms do not improve or worsen or seek emergency care

## 2021-07-31 NOTE — Assessment & Plan Note (Signed)
Cont w/ PT/OT

## 2021-07-31 NOTE — Progress Notes (Signed)
@Patient  ID: Rebecca [name], female    DOB: 11-Nov-1940, 81 y.o.   MRN: [id number]  Chief Complaint  Patient presents with   Follow-up    Referring provider: Farris Has, MD  HPI: 81 yo female former smoker followed for severe COPD and Chronic Respiratory Failure on Oxygen since 2010 , Lung nodules (stable on serial CT )  Medical history significant for nonischemic cardiomyopathy, diabetes, hypertension Lives in independent living at [place]  TEST/EVENTS :  Spirometry 01/2018 showed severe airway obstruction with ratio 43, FEV1 of 0.57-31% and FVC of 53%.   CT angio 06/18/18 LUL nodule 34mm   CT chest on February 16, 2019 that showed a stable 1.2 cm left thyroid nodule.  Severe emphysema.  Irregular 6 mm left upper lobe pulmonary nodule-stable and stable right upper lobe 5 mm pulmonary nodule.    Overnight oximetry test September 2021 showed no desaturations on 5 L of oxygen.   CT chest 09/2019 -No CT evidence of pulmonary embolism. 2. Marked severity emphysematous lung disease.(no mention of lung nodules )    CT chest January 12, 2020 negative for PE.  Small sliding hiatal hernia.  Emphysematous changes.  Basilar atelectasis. (no mention of lung nodules )  Chest x-ray February 21, 2020 interstitial thickening of the lung bases and emphysema in the upper lobes.   Hospitalization June 2021 for COPD exacerbation and hypercarbic hypoxic respiratory failure required BiPAP support.  07/31/2021 Follow up : COPD and O2 RF  Patient returns for 1 month follow up.  Patient was admitted last month for hemoptysis and acute on chronic respiratory failure.  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  Patient was felt to have possible aspiration pneumonia.  CT chest was negative for PE.  It showed bullous emphysema and bibasilar airspace opacities and groundglass opacities.  She was treated  with empiric antibiotics.  She did require discharge to rehab center.  Patient is slowly improving with decreased cough and congestion.  She is had no further hemoptysis.  She has returned back to independent living.  Palliative care is now following her at her apartment.  She remains on budesonide and Brovana nebulizer twice daily.  She is on ipratropium nebulizer 4 times daily.  She wants to add back in albuterol nebs Four times a day   , feels this helped her a lot. She remains on oxygen 4 L at rest and 5 L with activity.  She is had no increased oxygen demands. She gets winded with minimal activity and has dyspnea at rest.   Walks with rolling walker.  PT/OT at home each day.        Allergies  Allergen Reactions   Gabapentin Other (See Comments)    Affects breathing   Pravastatin Sodium Other (See Comments)    Muscle aches    Rosuvastatin Calcium     Muscle Aches    Simvastatin     Muscle aches  Other reaction(s): Muscle pain   Amoxicillin Rash   Lisinopril Rash    Other reaction(s): rash   Sulfacetamide Rash   Tetracyclines & Related Rash    Immunization History  Administered Date(s) Administered   Fluad Quad(high Dose 65+) 04/07/2020   Influenza Split 04/14/2018, 03/16/2019, 04/07/2019   Influenza, High Dose Seasonal PF 04/14/2018, 05/01/2019   Influenza-Unspecified 04/27/2021   Moderna SARS-COV2 Booster Vaccination 02/10/2021   Moderna Sars-Covid-2 Vaccination 08/16/2019, 09/13/2019, 05/22/2020  Pneumococcal Conjugate-13 11/15/2016, 02/05/2018   Tdap 07/27/2018   Zoster Recombinat (Shingrix) 07/14/2018, 09/14/2018   Zoster, Live 07/14/2018, 09/14/2018    Past Medical History:  Diagnosis Date   Congestive heart disease ([place])    COPD (chronic obstructive pulmonary disease) ([place])    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     albuterol (VENTOLIN HFA) 108 (90 Base) MCG/ACT inhaler Inhale 2 puffs into the lungs every 6 (six) hours as needed for wheezing or shortness of breath. 6.7 g 1   arformoterol (BROVANA) 15 MCG/2ML NEBU USE 1 VIAL  IN  NEBULIZER TWICE  DAILY - morning and evening 120 mL 0   aspirin EC 81 MG tablet Take 81 mg by mouth daily. Swallow whole.     bisacodyl (DULCOLAX) 10 MG suppository If not relieved by MOM, give 10 mg Bisacodyl suppositiory rectally X 1 dose in 24 hours as needed     brimonidine (ALPHAGAN) 0.2 % ophthalmic solution Place 1 drop into the left eye 2 (two) times daily. 5 mL 0   budesonide (PULMICORT) 0.25 MG/2ML nebulizer solution Take 2 mLs (0.25 mg total) by nebulization 2 (two) times daily. 1 VIAL 60 mL 1   Calcium Carbonate-Vitamin D 600-200 MG-UNIT TABS Take 1 tablet by mouth every evening.     carboxymethylcellulose (REFRESH PLUS) 0.5 % SOLN Place 1 drop into both eyes 3 (three) times daily as needed (dry eyes).     docusate sodium (COLACE) 100 MG capsule Take 100 mg by mouth daily.     estradiol (ESTRACE) 0.1 MG/GM vaginal cream Place 1 Applicatorful vaginally every other day. 42.5 g 1   ezetimibe (ZETIA) 10 MG tablet Take 1 tablet (10 mg total) by mouth daily. 30 tablet 0   furosemide (LASIX) 20 MG tablet Take 2 tablets (40 mg total) by mouth x 3 days, then decrease to taking 1 tablet (20 mg total) by mouth daily thereafter. 36 tablet 0   ipratropium (ATROVENT) 0.02 % nebulizer solution Take 2.5 mLs (0.5 mg total) by nebulization 4 (four) times daily. 300 mL 0   ivabradine (CORLANOR) 5 MG TABS tablet Take 1  tablet (5 mg total) by mouth 2 (two) times daily with a meal. 60 tablet 2   losartan (COZAAR) 50 MG tablet Take 1 tablet (50 mg total) by mouth daily. 30 tablet 0   magnesium hydroxide (MILK OF MAGNESIA) 400 MG/5ML suspension If no BM in 3 days, give 30 cc Milk of Magnesium p.o. x 1 dose in 24 hours as needed     metFORMIN (GLUCOPHAGE) 500 MG tablet Take 1 tablet (500 mg total) by mouth daily with supper. 30 tablet 0   Multiple Vitamins-Minerals (EYE VITAMINS PO) Take 1 tablet by mouth daily.     NON FORMULARY Diet:HH chopped/thin diet consistency     Polyethylene Glycol 3350 POWD by Does not apply route. TAKE 17GM IN 4-80z OF FLUID OF CHOICE ONCE  DAILY FOR CONSTIPATION     potassium chloride (KLOR-CON M) 10 MEQ tablet Take 2 tablets (20 mEq total) by mouth x 3 days, then decrease to taking 1 tablet (10 mEq total) by mouth daily thereafter. 36 tablet 0   predniSONE (DELTASONE) 5 MG tablet Take 1 tablet (5 mg total) by mouth daily. 30 tablet 0   senna (SENOKOT) 8.6 MG TABS tablet Take 2 tablets by mouth 2 (two) times daily.     Sodium Phosphates (RA SALINE ENEMA RE) If not relieved by Biscodyl suppository, give disposable Saline Enema rectally X 1 dose/24 hrs as needed. Contact MD as needed if no results from enema     carvedilol (COREG) 25 MG tablet Take 25 mg by mouth 2 (two) times daily with a meal. (Patient not taking: Reported on 07/31/2021)     No facility-administered medications prior to visit.     Review of Systems:   Constitutional:   No  weight loss, night sweats,  Fevers, chills, + fatigue, or  lassitude.  HEENT:   No headaches,  Difficulty swallowing,  Tooth/dental problems, or  Sore throat,                No sneezing, itching, ear ache, nasal congestion, post nasal drip,   CV:  No chest pain,  Orthopnea, PND, swelling in lower extremities, anasarca, dizziness, palpitations, syncope.   GI  No heartburn, indigestion, abdominal pain, nausea, vomiting, diarrhea, change in bowel  habits, loss of appetite, bloody stools.   Resp:   No chest wall deformity  Skin: no rash or lesions.  GU: no dysuria, change in color of urine, no urgency or frequency.  No flank pain, no hematuria   MS:  No joint pain or swelling.  No decreased range of motion.  No back pain.    Physical Exam  BP (!) 160/70 (BP Location: Left Arm, Patient Position: Sitting, Cuff Size: Normal)    Pulse 98    Temp 98.5 F (36.9 C) (Oral)    Ht 5\' 2"  (1.575 m)    Wt 155 lb (70.3 kg)    SpO2 93%    BMI 28.35 kg/m   GEN: A/Ox3; pleasant , NAD, chronically ill appearing on O2 , rolling walker    HEENT:  [place]/AT,   NOSE-clear, THROAT-clear, no lesions, no postnasal drip or exudate noted.   NECK:  Supple w/ fair ROM; no JVD; normal carotid impulses w/o bruits; no thyromegaly or nodules palpated; no lymphadenopathy.    RESP  Clear  P & A; w/o, wheezes/ rales/ or rhonchi. no accessory muscle use, no dullness to percussion  CARD:  RRR, no m/r/g, no peripheral edema, pulses intact, no cyanosis or clubbing.  GI:   Soft & nt; nml bowel sounds; no organomegaly or masses detected.   Musco: Warm bil, no deformities or joint swelling noted.   Neuro: [name], no focal deficits noted.    Skin: Warm, no lesions or rashes    Lab Results:  CBC   BMET   BNP   ProBNP   Imaging: No results found.    No flowsheet data found.  No results found for: NITRICOXIDE      Assessment & Plan:   COPD with chronic bronchitis and emphysema ([place]) Very severe COPD with emphysema -recent flare now improving  Resume duoneb Four times a day  .  For now remain on low dose steroids on return if stable consider slow taper off.    Plan  Patient Instructions  Chest xray today .  Continue on Prednisone 5mg  daily .  Continue on Budesonide Neb Twice daily  .  Continue on Brovana Twice daily  .  Restart on Albuterol /Ipratropium Neb four times a day   Activity as tolerated Saline nasal spray Twice daily    Saline nasal gel At bedtime   Robitussin Cough syrup As needed  cough/congestion  Palliative care as discussed. Continue on PT/OT as planned.  Continue on oxygen 4l/m rest and 5l/m activity . O2 sats goal >88-90%.  Follow-up with Dr. Elsworth Soho or Aleyssa Pike NP in 6-8 weeks  and As needed  Please contact office for sooner follow up if symptoms do not improve or worsen or seek emergency care       Chronic respiratory failure with hypoxia ([place]) Continue on O2 to keep sats >88-90%  Aspiration pneumonia [facility]) Recent admission now improved after antibiotics  Check chest xray today   Goals of care, counseling/discussion Cont w/ palliative care   Physical deconditioning Cont w/ PT/OT      Rexene Edison, NP 07/31/2021

## 2021-07-31 NOTE — Assessment & Plan Note (Signed)
Cont w/ palliative care

## 2021-07-31 NOTE — Patient Instructions (Addendum)
Chest xray today .  Continue on Prednisone 5mg  daily .  Continue on Budesonide Neb Twice daily  .  Continue on Brovana Twice daily  .  Restart on Albuterol /Ipratropium Neb four times a day   Activity as tolerated Saline nasal spray Twice daily   Saline nasal gel At bedtime   Robitussin Cough syrup As needed  cough/congestion  Palliative care as discussed. Continue on PT/OT as planned.  Continue on oxygen 4l/m rest and 5l/m activity . O2 sats goal >88-90%.  Follow-up with Dr. Elsworth Soho or Sundae Maners NP in 6-8 weeks  and As needed  Please contact office for sooner follow up if symptoms do not improve or worsen or seek emergency care

## 2021-07-31 NOTE — Assessment & Plan Note (Signed)
Recent admission now improved after antibiotics  Check chest xray today

## 2021-07-31 NOTE — Assessment & Plan Note (Signed)
Continue on O2 to keep sats >88-90% 

## 2021-08-01 ENCOUNTER — Telehealth: Payer: Self-pay | Admitting: Adult Health

## 2021-08-01 DIAGNOSIS — M6281 Muscle weakness (generalized): Secondary | ICD-10-CM | POA: Diagnosis not present

## 2021-08-01 DIAGNOSIS — R2689 Other abnormalities of gait and mobility: Secondary | ICD-10-CM | POA: Diagnosis not present

## 2021-08-01 NOTE — Telephone Encounter (Signed)
ATC patient.  LMTCB. 

## 2021-08-01 NOTE — Progress Notes (Signed)
ATC x1.  LVM to return call. 

## 2021-08-02 DIAGNOSIS — M6281 Muscle weakness (generalized): Secondary | ICD-10-CM | POA: Diagnosis not present

## 2021-08-03 ENCOUNTER — Other Ambulatory Visit: Payer: Self-pay | Admitting: Adult Health

## 2021-08-03 DIAGNOSIS — E1169 Type 2 diabetes mellitus with other specified complication: Secondary | ICD-10-CM

## 2021-08-03 NOTE — Telephone Encounter (Signed)
Sorry to hear she is sick again That is fine to take zpack along with her other meds  Please call back if not improving   Please contact office for sooner follow up if symptoms do not improve or worsen or seek emergency care

## 2021-08-03 NOTE — Telephone Encounter (Signed)
Called and spoke with patient who states that day before yesterday she developed a bad cough with yellow sputum. She states she had a Z pack at home and started taking it last night. Denies fever states that she is a little more short of breath and chest hurts. Sending to Tammy as FYI and to see if she has any further recommendations.   Tammy please advise

## 2021-08-06 ENCOUNTER — Telehealth: Payer: Self-pay | Admitting: *Deleted

## 2021-08-06 DIAGNOSIS — R2689 Other abnormalities of gait and mobility: Secondary | ICD-10-CM | POA: Diagnosis not present

## 2021-08-06 DIAGNOSIS — M6281 Muscle weakness (generalized): Secondary | ICD-10-CM | POA: Diagnosis not present

## 2021-08-06 NOTE — Telephone Encounter (Signed)
Called to give patient the results of her cxr, patient advised me that she had taken a zpack.  She states her cough is no better, but it is no worse.  What she is coughing up is green and she is blowing out yellow mucous from her nose.  She has one more tablet left.  I let her now I would make Tammy aware and if she feels she needs something further I would let her know.  She verbalized understanding.  I spoke with Tammy, she said to give the zpack some time to work.  Nothing further needed.

## 2021-08-06 NOTE — Progress Notes (Signed)
Called and spoke with patient advised of results/recommendations per Tammy Parrett NP, she verbalized understanding.  Nothing further needed.

## 2021-08-07 ENCOUNTER — Telehealth: Payer: Self-pay

## 2021-08-07 NOTE — Telephone Encounter (Signed)
Spoke with patient regarding scheduling a Palliative Care consult. She states she already has PC and does not know the name of the company. She declined services at this time. Will cancel referral and notify referring provider.

## 2021-08-08 DIAGNOSIS — J9611 Chronic respiratory failure with hypoxia: Secondary | ICD-10-CM | POA: Diagnosis not present

## 2021-08-08 DIAGNOSIS — J449 Chronic obstructive pulmonary disease, unspecified: Secondary | ICD-10-CM | POA: Diagnosis not present

## 2021-08-08 MED ORDER — LEVOFLOXACIN 500 MG PO TABS: 500.0000 mg | ORAL_TABLET | Freq: Every day | ORAL | 0 refills | Status: DC

## 2021-08-08 NOTE — Telephone Encounter (Signed)
I called and spoke with the pt and notified of response per TP  She verbalized understanding  Rx sent to pharm

## 2021-08-08 NOTE — Telephone Encounter (Signed)
Spoke with the pt  She finished zpack 2 days ago  She states her SOB has not improved and still coughing up thick, light yellow sputum  No fevers or aches  She is using robitussin instead of mucinex now  I offered appt and she refused  Please advise, thanks!  Allergies  Allergen Reactions   Gabapentin Other (See Comments)    Affects breathing   Pravastatin Sodium Other (See Comments)    Muscle aches    Rosuvastatin Calcium     Muscle Aches    Simvastatin     Muscle aches  Other reaction(s): Muscle pain   Amoxicillin Rash   Lisinopril Rash    Other reaction(s): rash   Sulfacetamide Rash   Tetracyclines & Related Rash

## 2021-08-08 NOTE — Telephone Encounter (Signed)
Sorry to hear this .  I can given another course of antibiotics (has multiple abx allergies)  She has been on multiple abx over last month with recent hospital stay.   Recommend Levaquin 500mg  daily for 7 days , take with food.   This will cover bronchitis and UTI  If not improving will need ov or got to ER.   Did see in notes she declined Palliative care.   She will need to follow up with PCP for UTI if sx persist.    Continue on Budesonide Neb Twice daily  .  Continue on Brovana Twice daily  .  Continue on Ipratropium Neb four times a day   Activity as tolerated Saline nasal spray Twice daily   Saline nasal gel At bedtime   Robitussin Cough syrup As needed  cough/congestion  Palliative care as discussed. Continue on oxygen 4l/m rest and 5l/m activity . O2 sats goal >88-90%.    Please contact office for sooner follow up if symptoms do not improve or worsen or seek emergency care   Needs ov with Dr. for next visit

## 2021-08-09 DIAGNOSIS — R2689 Other abnormalities of gait and mobility: Secondary | ICD-10-CM | POA: Diagnosis not present

## 2021-08-09 DIAGNOSIS — M6281 Muscle weakness (generalized): Secondary | ICD-10-CM | POA: Diagnosis not present

## 2021-08-13 ENCOUNTER — Other Ambulatory Visit: Payer: Self-pay | Admitting: Adult Health

## 2021-08-13 ENCOUNTER — Other Ambulatory Visit: Payer: Self-pay | Admitting: Pulmonary Disease

## 2021-08-13 DIAGNOSIS — M6281 Muscle weakness (generalized): Secondary | ICD-10-CM | POA: Diagnosis not present

## 2021-08-13 DIAGNOSIS — J449 Chronic obstructive pulmonary disease, unspecified: Secondary | ICD-10-CM

## 2021-08-13 DIAGNOSIS — R2689 Other abnormalities of gait and mobility: Secondary | ICD-10-CM | POA: Diagnosis not present

## 2021-08-13 NOTE — Telephone Encounter (Signed)
Tammy please advise on the following My Chart message:  [facility] (supporting Parrett, Tammy S, NP) 8 minutes ago (11:50 AM)   SE I continued to take the  med threw last night, I am Still  coughing  but not as much  flem is not as thick color is much li g hter. I mayas well finish I o n ly have 2 more pills. Today  is the first time I have  been  out of g my room in 3 days    You  [name] 1 hour ago (10:44 AM)   Peri [place] morning Dois Davenport,    How are you doing today? Please let  us know.    [facility]     [facility] Pulling [facility] (supporting Parrett, Tammy S, NP) 3 days ago   SE Too late, I already took tonight's dose  will call 911 if I  need to    Jacquiline Doe, LPN  [name] 3 days ago   LT I apologize for the delay in contacting you. If you are having numbness in your arms you need to be seen.  I suggest Urgent Care or ED for numbness or possible allergic reaction to the antibiotic and stopping the antibiotic until seen.     [name]  P [facility] (supporting Parrett, Tammy S, NP) 3 days ago   SE Pt would like to know if you want to continue her Prednisone as well and if so she needs a refill.

## 2021-08-13 NOTE — Telephone Encounter (Signed)
See pt advice note from 08/10/21.

## 2021-08-13 NOTE — Telephone Encounter (Signed)
Message from patient received for Tammy,NP-  I am getting  better,  still have a lot of congestion   but  isn't  as thick and dark  yellow  like it was. I am using  netipot and  washing  out  a bunch of  stuff once a day   Seems to be working if anything  changes I will call. Do you want t me to stay on prednisone  5MG , if so I need a refill  Thank you

## 2021-08-13 NOTE — Telephone Encounter (Signed)
Glad things are improving Yes remain on Prednisone 5mg  daily.  Can refill

## 2021-08-13 NOTE — Telephone Encounter (Signed)
Tried to call patient .  So is she having n/v due to Levaquin ?  Numbness ? Is she still having  Sounds like she needs office visit  Please contact office for sooner follow up if symptoms do not improve or worsen or seek emergency care

## 2021-08-14 ENCOUNTER — Ambulatory Visit: Payer: Medicare HMO | Admitting: Cardiology

## 2021-08-14 DIAGNOSIS — J449 Chronic obstructive pulmonary disease, unspecified: Secondary | ICD-10-CM | POA: Diagnosis not present

## 2021-08-14 MED ORDER — PREDNISONE 5 MG PO TABS: 5.0000 mg | ORAL_TABLET | Freq: Every day | ORAL | 0 refills | Status: DC

## 2021-08-15 DIAGNOSIS — M6281 Muscle weakness (generalized): Secondary | ICD-10-CM | POA: Diagnosis not present

## 2021-08-16 DIAGNOSIS — M6281 Muscle weakness (generalized): Secondary | ICD-10-CM | POA: Diagnosis not present

## 2021-08-17 DIAGNOSIS — R2689 Other abnormalities of gait and mobility: Secondary | ICD-10-CM | POA: Diagnosis not present

## 2021-08-17 DIAGNOSIS — M6281 Muscle weakness (generalized): Secondary | ICD-10-CM | POA: Diagnosis not present

## 2021-08-19 ENCOUNTER — Encounter: Payer: Self-pay | Admitting: Cardiology

## 2021-08-20 DIAGNOSIS — M6281 Muscle weakness (generalized): Secondary | ICD-10-CM | POA: Diagnosis not present

## 2021-08-20 NOTE — Telephone Encounter (Signed)
Called the pt.  She just wanted to go over her cardiac meds verbatim to make sure her list matches up with ours. Spoke with the pt and went over her cardiac med list verbatim as we have it listed in her med list in the chart. Pts list matches up with ours.  Pt verbalized understanding and was gracious for all the assistance provided.

## 2021-08-21 ENCOUNTER — Telehealth: Payer: Self-pay | Admitting: Pulmonary Disease

## 2021-08-21 DIAGNOSIS — M6281 Muscle weakness (generalized): Secondary | ICD-10-CM | POA: Diagnosis not present

## 2021-08-21 DIAGNOSIS — J449 Chronic obstructive pulmonary disease, unspecified: Secondary | ICD-10-CM

## 2021-08-21 DIAGNOSIS — R2689 Other abnormalities of gait and mobility: Secondary | ICD-10-CM | POA: Diagnosis not present

## 2021-08-21 MED ORDER — IPRATROPIUM BROMIDE 0.02 % IN SOLN: 0.5000 mg | Freq: Four times a day (QID) | RESPIRATORY_TRACT | 0 refills | Status: DC

## 2021-08-21 NOTE — Telephone Encounter (Signed)
Ipratropium 0.02% has been sent to [facility] pharmacy.  Patient is aware and voiced her understanding.  Nothing further needed at this time.

## 2021-08-22 DIAGNOSIS — M6281 Muscle weakness (generalized): Secondary | ICD-10-CM | POA: Diagnosis not present

## 2021-08-23 DIAGNOSIS — H93A1 Pulsatile tinnitus, right ear: Secondary | ICD-10-CM | POA: Diagnosis not present

## 2021-08-23 DIAGNOSIS — Z822 Family history of deafness and hearing loss: Secondary | ICD-10-CM | POA: Diagnosis not present

## 2021-08-23 DIAGNOSIS — H906 Mixed conductive and sensorineural hearing loss, bilateral: Secondary | ICD-10-CM | POA: Diagnosis not present

## 2021-08-23 DIAGNOSIS — J449 Chronic obstructive pulmonary disease, unspecified: Secondary | ICD-10-CM | POA: Diagnosis not present

## 2021-08-23 DIAGNOSIS — E119 Type 2 diabetes mellitus without complications: Secondary | ICD-10-CM | POA: Diagnosis not present

## 2021-08-23 DIAGNOSIS — Z77122 Contact with and (suspected) exposure to noise: Secondary | ICD-10-CM | POA: Diagnosis not present

## 2021-08-24 DIAGNOSIS — M6281 Muscle weakness (generalized): Secondary | ICD-10-CM | POA: Diagnosis not present

## 2021-08-24 NOTE — Telephone Encounter (Signed)
Mychart message sent by pt: [facility] (supporting Parrett, Tammy S, NP) 3 hours ago (10:08 AM)   SE Please take me off of prednisone  a n d see how I do  I shake inside and out all the time.      Tammy, please advise.

## 2021-08-24 NOTE — Telephone Encounter (Signed)
May decrease prednisone 5 mg to half a tablet daily for 1 week and then half a tablet every other day for 1 week and stop We will follow-up in the office next month as planned and as needed Please contact office for sooner follow up if symptoms do not improve or worsen or seek emergency care

## 2021-08-27 DIAGNOSIS — R2689 Other abnormalities of gait and mobility: Secondary | ICD-10-CM | POA: Diagnosis not present

## 2021-08-27 DIAGNOSIS — M6281 Muscle weakness (generalized): Secondary | ICD-10-CM | POA: Diagnosis not present

## 2021-08-28 DIAGNOSIS — M6281 Muscle weakness (generalized): Secondary | ICD-10-CM | POA: Diagnosis not present

## 2021-08-30 DIAGNOSIS — H401131 Primary open-angle glaucoma, bilateral, mild stage: Secondary | ICD-10-CM | POA: Diagnosis not present

## 2021-08-30 DIAGNOSIS — H524 Presbyopia: Secondary | ICD-10-CM | POA: Diagnosis not present

## 2021-08-30 DIAGNOSIS — E119 Type 2 diabetes mellitus without complications: Secondary | ICD-10-CM | POA: Diagnosis not present

## 2021-08-30 DIAGNOSIS — H532 Diplopia: Secondary | ICD-10-CM | POA: Diagnosis not present

## 2021-08-30 DIAGNOSIS — H31093 Other chorioretinal scars, bilateral: Secondary | ICD-10-CM | POA: Diagnosis not present

## 2021-08-31 ENCOUNTER — Other Ambulatory Visit: Payer: Self-pay | Admitting: Adult Health

## 2021-08-31 DIAGNOSIS — M6281 Muscle weakness (generalized): Secondary | ICD-10-CM | POA: Diagnosis not present

## 2021-08-31 DIAGNOSIS — J449 Chronic obstructive pulmonary disease, unspecified: Secondary | ICD-10-CM

## 2021-09-03 DIAGNOSIS — R2689 Other abnormalities of gait and mobility: Secondary | ICD-10-CM | POA: Diagnosis not present

## 2021-09-03 DIAGNOSIS — M6281 Muscle weakness (generalized): Secondary | ICD-10-CM | POA: Diagnosis not present

## 2021-09-04 DIAGNOSIS — M6281 Muscle weakness (generalized): Secondary | ICD-10-CM | POA: Diagnosis not present

## 2021-09-05 DIAGNOSIS — M6281 Muscle weakness (generalized): Secondary | ICD-10-CM | POA: Diagnosis not present

## 2021-09-05 DIAGNOSIS — R2689 Other abnormalities of gait and mobility: Secondary | ICD-10-CM | POA: Diagnosis not present

## 2021-09-05 DIAGNOSIS — J449 Chronic obstructive pulmonary disease, unspecified: Secondary | ICD-10-CM | POA: Diagnosis not present

## 2021-09-06 DIAGNOSIS — M6281 Muscle weakness (generalized): Secondary | ICD-10-CM | POA: Diagnosis not present

## 2021-09-07 DIAGNOSIS — M6281 Muscle weakness (generalized): Secondary | ICD-10-CM | POA: Diagnosis not present

## 2021-09-08 ENCOUNTER — Other Ambulatory Visit: Payer: Self-pay | Admitting: Adult Health

## 2021-09-08 DIAGNOSIS — J449 Chronic obstructive pulmonary disease, unspecified: Secondary | ICD-10-CM | POA: Diagnosis not present

## 2021-09-08 DIAGNOSIS — J9611 Chronic respiratory failure with hypoxia: Secondary | ICD-10-CM | POA: Diagnosis not present

## 2021-09-11 DIAGNOSIS — M6281 Muscle weakness (generalized): Secondary | ICD-10-CM | POA: Diagnosis not present

## 2021-09-12 DIAGNOSIS — M6281 Muscle weakness (generalized): Secondary | ICD-10-CM | POA: Diagnosis not present

## 2021-09-12 DIAGNOSIS — R2689 Other abnormalities of gait and mobility: Secondary | ICD-10-CM | POA: Diagnosis not present

## 2021-09-13 DIAGNOSIS — M6281 Muscle weakness (generalized): Secondary | ICD-10-CM | POA: Diagnosis not present

## 2021-09-13 DIAGNOSIS — R2689 Other abnormalities of gait and mobility: Secondary | ICD-10-CM | POA: Diagnosis not present

## 2021-09-14 DIAGNOSIS — M6281 Muscle weakness (generalized): Secondary | ICD-10-CM | POA: Diagnosis not present

## 2021-09-17 DIAGNOSIS — R2689 Other abnormalities of gait and mobility: Secondary | ICD-10-CM | POA: Diagnosis not present

## 2021-09-17 DIAGNOSIS — M6281 Muscle weakness (generalized): Secondary | ICD-10-CM | POA: Diagnosis not present

## 2021-09-18 DIAGNOSIS — M6281 Muscle weakness (generalized): Secondary | ICD-10-CM | POA: Diagnosis not present

## 2021-09-19 DIAGNOSIS — R2689 Other abnormalities of gait and mobility: Secondary | ICD-10-CM | POA: Diagnosis not present

## 2021-09-19 DIAGNOSIS — M6281 Muscle weakness (generalized): Secondary | ICD-10-CM | POA: Diagnosis not present

## 2021-09-21 DIAGNOSIS — M6281 Muscle weakness (generalized): Secondary | ICD-10-CM | POA: Diagnosis not present

## 2021-09-24 DIAGNOSIS — R2689 Other abnormalities of gait and mobility: Secondary | ICD-10-CM | POA: Diagnosis not present

## 2021-09-24 DIAGNOSIS — M6281 Muscle weakness (generalized): Secondary | ICD-10-CM | POA: Diagnosis not present

## 2021-09-25 ENCOUNTER — Encounter: Payer: Self-pay | Admitting: Pulmonary Disease

## 2021-09-25 ENCOUNTER — Telehealth: Payer: Self-pay | Admitting: Pulmonary Disease

## 2021-09-25 ENCOUNTER — Other Ambulatory Visit: Payer: Self-pay

## 2021-09-25 ENCOUNTER — Other Ambulatory Visit (HOSPITAL_BASED_OUTPATIENT_CLINIC_OR_DEPARTMENT_OTHER)
Admission: EM | Admit: 2021-09-25 | Discharge: 2021-09-25 | Disposition: A | Discharge status: Home / Self Care | Payer: Medicare HMO | Attending: Pulmonary Disease | Admitting: Pulmonary Disease

## 2021-09-25 ENCOUNTER — Ambulatory Visit: Payer: Medicare HMO | Admitting: Pulmonary Disease

## 2021-09-25 VITALS — BP 160/84 | HR 102 | Temp 98.5°F | Ht 62.0 in | Wt 153.4 lb

## 2021-09-25 DIAGNOSIS — J189 Pneumonia, unspecified organism: Secondary | ICD-10-CM | POA: Diagnosis not present

## 2021-09-25 DIAGNOSIS — J9612 Chronic respiratory failure with hypercapnia: Secondary | ICD-10-CM

## 2021-09-25 DIAGNOSIS — J9611 Chronic respiratory failure with hypoxia: Secondary | ICD-10-CM | POA: Diagnosis not present

## 2021-09-25 DIAGNOSIS — J449 Chronic obstructive pulmonary disease, unspecified: Secondary | ICD-10-CM | POA: Diagnosis not present

## 2021-09-25 LAB — I-STAT ARTERIAL BLOOD GAS, ED
Acid-Base Excess: 16 mmol/L — ABNORMAL HIGH (ref 0.0–2.0)
Bicarbonate: 44.7 mmol/L — ABNORMAL HIGH (ref 20.0–28.0)
Calcium, Ion: 1.29 mmol/L (ref 1.15–1.40)
HCT: 36 % (ref 36.0–46.0)
Hemoglobin: 12.2 g/dL (ref 12.0–15.0)
O2 Saturation: 78 %
Potassium: 4.4 mmol/L (ref 3.5–5.1)
Sodium: 136 mmol/L (ref 135–145)
TCO2: 47 mmol/L — ABNORMAL HIGH (ref 22–32)
pCO2 arterial: 80.6 mmHg (ref 32–48)
pH, Arterial: 7.352 (ref 7.35–7.45)
pO2, Arterial: 47 mmHg — ABNORMAL LOW (ref 83–108)

## 2021-09-25 NOTE — Assessment & Plan Note (Signed)
Bibasilar infiltrates noted during hospitalization have resolved ?

## 2021-09-25 NOTE — ED Notes (Signed)
Patient arrived from [facility] for an outpatient ABG. ABG ran via iSTAT.  ?

## 2021-09-25 NOTE — Progress Notes (Signed)
? ?  Subjective:  ? ? Patient ID: Rebecca [name], female    DOB: December 17, 1940, 81 y.o.   MRN: [id number] ? ?HPI ? ?81 yo ex-smoker for follow-up of COPD and chronic respiratory failure on oxygen since 2010 ?  ?She smoked about a pack per day until she quit in 2003, more than 40 pack years ?PMH -nonischemic cardiomyopathy, last EF 46% 07/2020 ,diabetes and hypertension ?Lives in independent living at [place] ? ?-Hosp 6/ 2021 for COPD exacerbation and hypercarbic hypoxic respiratory failure required BiPAP support. ? ?admitted 06/2021 for hemoptysis and acute on chronic respiratory failure.  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  Patient was felt to have possible aspiration pneumonia.  CT chest was negative for PE.  It showed bullous emphysema and bibasilar airspace opacities and bibasilar groundglass opacities.  She was treated with empiric antibiotics.   ? ?Accompanied by daughter [name] who comes in from [place]. ?She feels that since her hospitalization in December, mom has never recovered and has a "new low" she reports decreased energy and persistent shortness of breath.  She is compliant with Pulmicort and Brovana nebs twice daily and uses Atrovent nebs 4 times a day. ? ?Chest x-ray 1/17 was reviewed which shows resolved bibasilar infiltrates and hyperinflation. ?She reports sinus drainage and increased tremors.  She completed longer course of 5 mg of prednisone ?She is now taking 20 mg of Lasix daily and denies significant pedal edema ? ? ?Significant tests/ events reviewed ? ?Spirometry 01/2018 showed severe airway obstruction with ratio 43, FEV1 of 0.57-31% and FVC of 53%. ?  ?CT angio 06/2018 LUL nodule 75mm ?  ?CT chest angio 12/2019 >> severe emphysema ? ?Review of Systems ?neg for any significant sore throat, dysphagia, itching, sneezing, nasal congestion or excess/  purulent secretions, fever, chills, sweats, unintended wt loss, pleuritic or exertional cp, hempoptysis, orthopnea pnd or change in chronic leg swelling. Also denies presyncope, palpitations, heartburn, abdominal pain, nausea, vomiting, diarrhea or change in bowel or urinary habits, dysuria,hematuria, rash, arthralgias, visual complaints, headache, numbness weakness or ataxia. ? ?   ?Objective:  ? Physical Exam ? ?Gen. Pleasant, obese, in no distress, normal affect ?ENT - no pallor,icterus, no post nasal drip, class 2-3 airway ?Neck: No JVD, no thyromegaly, no carotid bruits ?Lungs: no use of accessory muscles, no dullness to percussion, decreased without rales or rhonchi  ?Cardiovascular: Rhythm regular, heart sounds  normal, no murmurs or gallops, no peripheral edema ?Abdomen: soft and non-tender, no hepatosplenomegaly, BS normal. ?Musculoskeletal: No deformities, no cyanosis or clubbing ?Neuro:  alert, non focal, no tremors ? ? ? ?   ?Assessment & Plan:  ? ? ?Assessment:  ? ? 1 or more chronic illnesses with severe exacerbation, ?progression, or side effects of treatment; ? ? 1 acute or chronic illness or injury that poses a threat to ?life or bodily function -chronic hypoxic and hypercarbic respiratory failure ? ?Plan Following Extensive Data Review & Interpretation:  ? I reviewed prior external note(s) from hospital discharge summary ? I reviewed the result(s) of chest x-ray from January ? I have ordered ABG ? ?Independent interpretation of tests ? Review of patient's chest x-ray images revealed improved bibasilar infiltrates. The patient's images have been independently reviewed by me.   ? ?Discussion of management or test interpretation with another colleague PCP.  ? ? ? ?

## 2021-09-25 NOTE — Assessment & Plan Note (Signed)
Continue current regimen of budesonide and Brovana with Atrovent nebs and albuterol for breakthrough. ?I offered her another course of prednisone but she would like to avoid due to side effects ? ?If NIV does not work, we may have to consider low-dose opiates for persistent dyspnea and initiated palliative conversation ?

## 2021-09-25 NOTE — Telephone Encounter (Signed)
Spoke to [place] with [facility] bridge. she wanted to make Dr. Vassie Loll aware that they are not allowed to do out patient ABG. They were able to do a work around for patient today but in the future hey will not be able to do so. ?Nothing further needed.  ? ?Routing to Dr. Vassie Loll, as an [place] Childes.    ?

## 2021-09-25 NOTE — Assessment & Plan Note (Addendum)
She has persistent shortness of breath ?Oxygen is compensated with 4 L. ?ABG was obtained today which shows 7.3 5/81/40 5/78% on 4 L nasal cannula,, corresponding saturation was 95% so I presume this is mixed ABG but she does have persistent hypercarbia and would qualify for NIV.  She is willing to trial and we will send in a prescription to DME ? 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  Interruption or failure to provide NIV would quickly lead to exacerbation of the patient's condition, hospital readmission, and likely harm the patient.  Continued use is preferred.  The use of the NIV will treat the patient's PCO2 levels and can reduce the risk of exacerbations and future hospitalizations when used at night and during the day.  Bilevel/RAD therapy with and without a rate would be ineffective as the patient requires a volume targeted mode.  Ventilation is required to decrease the work of breathing and improve pulmonary status.  Interruption of ventilator support would lead to a decline of health status.  Patient is able to protect their airways and clear secretions on their own. ? ?

## 2021-09-25 NOTE — ED Notes (Signed)
iStat ABG completed, but transfer to MyChart/Epic delayed. Results 7.352/80.6/47/44.7/78%. Dr Vassie Loll made aware of values at the time of result. No further needs during this encounter.  ?

## 2021-09-25 NOTE — Patient Instructions (Signed)
?  X Check ABG on 4l O2 ? ?X we will send Rx to [facility] for nocturnal BiPAP ?

## 2021-09-26 DIAGNOSIS — M6281 Muscle weakness (generalized): Secondary | ICD-10-CM | POA: Diagnosis not present

## 2021-09-26 DIAGNOSIS — R2689 Other abnormalities of gait and mobility: Secondary | ICD-10-CM | POA: Diagnosis not present

## 2021-09-27 DIAGNOSIS — M6281 Muscle weakness (generalized): Secondary | ICD-10-CM | POA: Diagnosis not present

## 2021-09-27 DIAGNOSIS — R2689 Other abnormalities of gait and mobility: Secondary | ICD-10-CM | POA: Diagnosis not present

## 2021-09-28 DIAGNOSIS — R2689 Other abnormalities of gait and mobility: Secondary | ICD-10-CM | POA: Diagnosis not present

## 2021-09-28 DIAGNOSIS — M6281 Muscle weakness (generalized): Secondary | ICD-10-CM | POA: Diagnosis not present

## 2021-10-01 DIAGNOSIS — M6281 Muscle weakness (generalized): Secondary | ICD-10-CM | POA: Diagnosis not present

## 2021-10-02 DIAGNOSIS — R2689 Other abnormalities of gait and mobility: Secondary | ICD-10-CM | POA: Diagnosis not present

## 2021-10-02 DIAGNOSIS — M6281 Muscle weakness (generalized): Secondary | ICD-10-CM | POA: Diagnosis not present

## 2021-10-03 ENCOUNTER — Ambulatory Visit: Payer: Medicare HMO | Admitting: Physician Assistant

## 2021-10-03 DIAGNOSIS — M6281 Muscle weakness (generalized): Secondary | ICD-10-CM | POA: Diagnosis not present

## 2021-10-03 DIAGNOSIS — R2689 Other abnormalities of gait and mobility: Secondary | ICD-10-CM | POA: Diagnosis not present

## 2021-10-04 ENCOUNTER — Encounter: Payer: Self-pay | Admitting: Pulmonary Disease

## 2021-10-04 DIAGNOSIS — J342 Deviated nasal septum: Secondary | ICD-10-CM | POA: Diagnosis not present

## 2021-10-04 DIAGNOSIS — R519 Headache, unspecified: Secondary | ICD-10-CM | POA: Diagnosis not present

## 2021-10-04 DIAGNOSIS — Z87891 Personal history of nicotine dependence: Secondary | ICD-10-CM | POA: Diagnosis not present

## 2021-10-04 DIAGNOSIS — H938X3 Other specified disorders of ear, bilateral: Secondary | ICD-10-CM | POA: Diagnosis not present

## 2021-10-04 DIAGNOSIS — Z8709 Personal history of other diseases of the respiratory system: Secondary | ICD-10-CM | POA: Diagnosis not present

## 2021-10-04 DIAGNOSIS — Z9089 Acquired absence of other organs: Secondary | ICD-10-CM | POA: Diagnosis not present

## 2021-10-04 DIAGNOSIS — Z9889 Other specified postprocedural states: Secondary | ICD-10-CM | POA: Diagnosis not present

## 2021-10-04 DIAGNOSIS — J449 Chronic obstructive pulmonary disease, unspecified: Secondary | ICD-10-CM | POA: Diagnosis not present

## 2021-10-04 DIAGNOSIS — J343 Hypertrophy of nasal turbinates: Secondary | ICD-10-CM | POA: Diagnosis not present

## 2021-10-05 DIAGNOSIS — M6281 Muscle weakness (generalized): Secondary | ICD-10-CM | POA: Diagnosis not present

## 2021-10-05 MED ORDER — PREDNISONE 5 MG PO TABS: ORAL_TABLET | ORAL | 0 refills | Status: DC

## 2021-10-05 NOTE — Telephone Encounter (Signed)
Received message from patient stating that her ENT Dr. Fredric Dine. Dr. Fredric Dine wanted her to resume the daily prednisone but she did not send in any nor made any recommendations.  ? ?She wanted to know if she would need to start back at the prednisone 5mg  or 10mg  dosage.  ? ?RA, can you please advise? Thanks!  ?

## 2021-10-06 ENCOUNTER — Other Ambulatory Visit: Payer: Self-pay | Admitting: Adult Health

## 2021-10-06 ENCOUNTER — Encounter: Payer: Self-pay | Admitting: Pulmonary Disease

## 2021-10-06 DIAGNOSIS — J9611 Chronic respiratory failure with hypoxia: Secondary | ICD-10-CM | POA: Diagnosis not present

## 2021-10-06 DIAGNOSIS — E785 Hyperlipidemia, unspecified: Secondary | ICD-10-CM

## 2021-10-06 DIAGNOSIS — J449 Chronic obstructive pulmonary disease, unspecified: Secondary | ICD-10-CM | POA: Diagnosis not present

## 2021-10-08 DIAGNOSIS — R2689 Other abnormalities of gait and mobility: Secondary | ICD-10-CM | POA: Diagnosis not present

## 2021-10-08 DIAGNOSIS — M6281 Muscle weakness (generalized): Secondary | ICD-10-CM | POA: Diagnosis not present

## 2021-10-08 NOTE — Telephone Encounter (Signed)
I am  sorry,  I misunderstood  ENT  Dr. She said NO need to go on steroid  a t this time  ? ?Patient is referring to last mychart message encounter.  ? ?

## 2021-10-09 DIAGNOSIS — M6281 Muscle weakness (generalized): Secondary | ICD-10-CM | POA: Diagnosis not present

## 2021-10-10 ENCOUNTER — Other Ambulatory Visit: Payer: Self-pay | Admitting: *Deleted

## 2021-10-10 DIAGNOSIS — I5022 Chronic systolic (congestive) heart failure: Secondary | ICD-10-CM

## 2021-10-10 DIAGNOSIS — M6281 Muscle weakness (generalized): Secondary | ICD-10-CM | POA: Diagnosis not present

## 2021-10-10 DIAGNOSIS — I5020 Unspecified systolic (congestive) heart failure: Secondary | ICD-10-CM

## 2021-10-10 DIAGNOSIS — R2689 Other abnormalities of gait and mobility: Secondary | ICD-10-CM | POA: Diagnosis not present

## 2021-10-10 DIAGNOSIS — I2510 Atherosclerotic heart disease of native coronary artery without angina pectoris: Secondary | ICD-10-CM

## 2021-10-10 DIAGNOSIS — Z79899 Other long term (current) drug therapy: Secondary | ICD-10-CM

## 2021-10-10 DIAGNOSIS — I442 Atrioventricular block, complete: Secondary | ICD-10-CM

## 2021-10-10 MED ORDER — IVABRADINE HCL 5 MG PO TABS: 5.0000 mg | ORAL_TABLET | Freq: Two times a day (BID) | ORAL | 2 refills | Status: DC

## 2021-10-11 DIAGNOSIS — R2689 Other abnormalities of gait and mobility: Secondary | ICD-10-CM | POA: Diagnosis not present

## 2021-10-11 DIAGNOSIS — M6281 Muscle weakness (generalized): Secondary | ICD-10-CM | POA: Diagnosis not present

## 2021-10-11 DIAGNOSIS — J449 Chronic obstructive pulmonary disease, unspecified: Secondary | ICD-10-CM | POA: Diagnosis not present

## 2021-10-12 DIAGNOSIS — M6281 Muscle weakness (generalized): Secondary | ICD-10-CM | POA: Diagnosis not present

## 2021-10-15 ENCOUNTER — Other Ambulatory Visit: Payer: Self-pay | Admitting: Pulmonary Disease

## 2021-10-15 DIAGNOSIS — R0989 Other specified symptoms and signs involving the circulatory and respiratory systems: Secondary | ICD-10-CM | POA: Diagnosis not present

## 2021-10-15 DIAGNOSIS — J449 Chronic obstructive pulmonary disease, unspecified: Secondary | ICD-10-CM | POA: Diagnosis not present

## 2021-10-17 ENCOUNTER — Other Ambulatory Visit: Payer: Self-pay | Admitting: Adult Health

## 2021-10-17 ENCOUNTER — Encounter: Payer: Self-pay | Admitting: Pulmonary Disease

## 2021-10-17 DIAGNOSIS — R2689 Other abnormalities of gait and mobility: Secondary | ICD-10-CM | POA: Diagnosis not present

## 2021-10-17 DIAGNOSIS — J449 Chronic obstructive pulmonary disease, unspecified: Secondary | ICD-10-CM

## 2021-10-17 DIAGNOSIS — M6281 Muscle weakness (generalized): Secondary | ICD-10-CM | POA: Diagnosis not present

## 2021-10-18 DIAGNOSIS — M6281 Muscle weakness (generalized): Secondary | ICD-10-CM | POA: Diagnosis not present

## 2021-10-18 NOTE — Telephone Encounter (Signed)
No I haven't seen anything. Routing to Dr. Vassie Loll ?

## 2021-10-18 NOTE — Telephone Encounter (Signed)
Mandi, did you see if Dr. Vassie Loll received this denial? Thanks.  ?

## 2021-10-19 ENCOUNTER — Other Ambulatory Visit: Payer: Self-pay

## 2021-10-19 ENCOUNTER — Other Ambulatory Visit: Payer: Self-pay | Admitting: Adult Health

## 2021-10-19 ENCOUNTER — Other Ambulatory Visit (HOSPITAL_COMMUNITY): Payer: Self-pay

## 2021-10-19 DIAGNOSIS — I442 Atrioventricular block, complete: Secondary | ICD-10-CM

## 2021-10-19 DIAGNOSIS — E1169 Type 2 diabetes mellitus with other specified complication: Secondary | ICD-10-CM

## 2021-10-19 DIAGNOSIS — E785 Hyperlipidemia, unspecified: Secondary | ICD-10-CM

## 2021-10-19 DIAGNOSIS — I5022 Chronic systolic (congestive) heart failure: Secondary | ICD-10-CM

## 2021-10-19 DIAGNOSIS — Z79899 Other long term (current) drug therapy: Secondary | ICD-10-CM

## 2021-10-19 DIAGNOSIS — I5020 Unspecified systolic (congestive) heart failure: Secondary | ICD-10-CM

## 2021-10-19 DIAGNOSIS — I2510 Atherosclerotic heart disease of native coronary artery without angina pectoris: Secondary | ICD-10-CM

## 2021-10-19 DIAGNOSIS — J449 Chronic obstructive pulmonary disease, unspecified: Secondary | ICD-10-CM

## 2021-10-19 MED ORDER — POTASSIUM CHLORIDE CRYS ER 10 MEQ PO TBCR: 10.0000 meq | EXTENDED_RELEASE_TABLET | Freq: Every day | ORAL | 2 refills | Status: DC

## 2021-10-19 MED ORDER — EZETIMIBE 10 MG PO TABS: 10.0000 mg | ORAL_TABLET | Freq: Every day | ORAL | 2 refills | Status: DC

## 2021-10-19 NOTE — Telephone Encounter (Signed)
Pt's medication was sent to pt's pharmacy as requested. Confirmation received.  °

## 2021-10-22 DIAGNOSIS — M6281 Muscle weakness (generalized): Secondary | ICD-10-CM | POA: Diagnosis not present

## 2021-10-22 DIAGNOSIS — R2689 Other abnormalities of gait and mobility: Secondary | ICD-10-CM | POA: Diagnosis not present

## 2021-10-23 DIAGNOSIS — M6281 Muscle weakness (generalized): Secondary | ICD-10-CM | POA: Diagnosis not present

## 2021-10-23 DIAGNOSIS — R2689 Other abnormalities of gait and mobility: Secondary | ICD-10-CM | POA: Diagnosis not present

## 2021-10-24 DIAGNOSIS — M6281 Muscle weakness (generalized): Secondary | ICD-10-CM | POA: Diagnosis not present

## 2021-10-25 DIAGNOSIS — M6281 Muscle weakness (generalized): Secondary | ICD-10-CM | POA: Diagnosis not present

## 2021-10-25 NOTE — Telephone Encounter (Signed)
Received denial from [facility] for NIV ?'we do not have evidence of failure of a standard biPAP in AVAPS mode " ? ?Please let pt know & send request to DME for BiPAP - auto AVAPS mode ?EPAP min 5, IPAP max 20 , PS +4 ?

## 2021-10-26 DIAGNOSIS — N200 Calculus of kidney: Secondary | ICD-10-CM | POA: Diagnosis not present

## 2021-10-26 DIAGNOSIS — R339 Retention of urine, unspecified: Secondary | ICD-10-CM | POA: Diagnosis not present

## 2021-10-26 DIAGNOSIS — N952 Postmenopausal atrophic vaginitis: Secondary | ICD-10-CM | POA: Diagnosis not present

## 2021-10-26 DIAGNOSIS — N281 Cyst of kidney, acquired: Secondary | ICD-10-CM | POA: Diagnosis not present

## 2021-10-26 DIAGNOSIS — N819 Female genital prolapse, unspecified: Secondary | ICD-10-CM | POA: Diagnosis not present

## 2021-10-26 DIAGNOSIS — N8111 Cystocele, midline: Secondary | ICD-10-CM | POA: Diagnosis not present

## 2021-10-29 DIAGNOSIS — M6281 Muscle weakness (generalized): Secondary | ICD-10-CM | POA: Diagnosis not present

## 2021-10-29 DIAGNOSIS — R2689 Other abnormalities of gait and mobility: Secondary | ICD-10-CM | POA: Diagnosis not present

## 2021-10-29 NOTE — Progress Notes (Signed)
? ?Cardiology Office Note   ? ?Date:  11/07/2021  ? ?ID:  Rebecca [name], DOB [birth year], MRN [id number] ? ? ?PCP:  London Pepper, MD ?  ?[place]  ?Cardiologist:  Freada Bergeron, MD  ?Advanced Practice Provider:  No care team member to display ?Electrophysiologist:  None  ? ?[id number]  ? ?No chief complaint on file. ? ? ?History of Present Illness:  ?Rebecca [name] is a 81 y.o. 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 ?viral cardiomyopathy in 2010 for which she was followed by Cardiology with serial TTEs. She was told by her Cardiologist in [place] that her EF recovered and she did not need to follow up further.  ? ?DOE worse than her chronic SOB associated with her COPD for which she is on continuous O2. We obtained a TTE which revealed LVEF 40-45% with anteroseptal, mid inferoseptal and basal inferoseptal hypokinesis. Follow-up SPECT showed no evidence of ischemia or infarction but abnormal septal motion consistent with LBBB.   ? ?Was admitted 06/14/21-06/15/21 for planned cath due to persistent dyspnea on exertion despite treatment of COPD, known WMA and depressed EF. She underwent LHC/RHC which showed nonobstructive CAD, LVEDP 76mmHg, RV 48/5. Cath course complicated by CHB in the setting of known LBBB requiring transvenous pacing. This resolved and the patient was discharged home off BB.  ? ?Admitted with pneumonia 12/6-12/13/22. She saw Dr. Johney Frame and had self resumed her coreg for HR 120/s. Lasix increased 40 mg bid for 3 days, corlenor started, if can't afford will need to monitor for CHB on low dose metoprolol. No SGLT-2i with freq UTI's and off spiro due to hyperk. ? ?Patient comes in with worsening short of breath. BP running high 152/72. Not able to do activities at facility but doing PT/OT daily.Corlanor not affordable and she feels much worse. Says she was on metoprolol  before and did well on it. Patient says HR goes up with little activity. Can't walk across the hall without heart racing. Patient has tried taking higher dose lasix without help and has stopped lasix and didn't make a difference. She thinks she's allergic to it.  ? ?Past Medical History:  ?Diagnosis Date  ? Congestive heart disease ([place])   ? COPD (chronic obstructive pulmonary disease) ([place])   ? Diabetes ([place]) with atherosclerosis   ? Hyperlipemia   ? Osteoporosis   ? Primary hypertension   ? ? ?Past Surgical History:  ?Procedure Laterality Date  ? RIGHT/LEFT HEART CATH AND CORONARY ANGIOGRAPHY N/A 06/14/2021  ? Procedure: RIGHT/LEFT HEART CATH AND CORONARY ANGIOGRAPHY;  Surgeon: [name], [name], MD;  Location: [facility];  Service: Cardiovascular;  Laterality: N/A;  ? TEMPORARY PACEMAKER N/A 06/14/2021  ? Procedure: TEMPORARY PACEMAKER;  Surgeon: [name], [name], MD;  Location: [facility];  Service: Cardiovascular;  Laterality: N/A;  ? ? ?Current Medications: ?Current Meds  ?Medication Sig  ? [DISCONTINUED] metoprolol tartrate (LOPRESSOR) 25 MG tablet Take 0.5 tablets (12.5 mg total) by mouth 2 (two) times daily.  ? [DISCONTINUED] torsemide (DEMADEX) 20 MG tablet Take 1 tablet (20 mg total) by mouth daily.  ?  ? ?Allergies:   Gabapentin, Pravastatin sodium, Rosuvastatin calcium, Simvastatin, Amoxicillin, Lisinopril, Sulfacetamide, and Tetracyclines & related  ? ?Social History  ? ?Socioeconomic History  ? Marital status: Widowed  ?  Spouse name: Not on file  ? Number of children:  Not on file  ? Years of education: Not on file  ? Highest education level: Not on file  ?Occupational History  ? Not on file  ?Tobacco Use  ? Smoking status: Former  ?  Packs/day: 1.50  ?  Years: 40.00  ?  Pack years: 60.00  ?  Types: Cigarettes  ?  Quit date: 2003  ?  Years since quitting: 20.3  ? Smokeless tobacco: Never  ?Vaping Use  ? Vaping Use: Never used  ?Substance and Sexual Activity  ? Alcohol use: Yes  ?   Alcohol/week: 1.0 standard drink  ?  Types: 1 Glasses of wine per week  ? Drug use: Never  ? Sexual activity: Not on file  ?Other Topics Concern  ? Not on file  ?Social History Narrative  ? Not on file  ? ?Social Determinants of Health  ? ?Financial Resource Strain: Not on file  ?Food Insecurity: Not on file  ?Transportation Needs: Not on file  ?Physical Activity: Not on file  ?Stress: Not on file  ?Social Connections: Not on file  ?  ? ?Family History:  The patient's  family history is not on file.  ? ?ROS:   ?Please see the history of present illness.    ?ROS All other systems reviewed and are negative. ? ? ?PHYSICAL EXAM:   ?VS:  BP (!) 178/54   Pulse 90   Wt 149 lb (67.6 kg)   SpO2 (!) 89%   BMI 27.25 kg/m?   ?Physical Exam  ?GEN: Well nourished, well developed, in no acute distress  ?Neck: no JVD, carotid bruits, or masses ?Cardiac:RRR; no murmurs, rubs, or gallops  ?Respiratory:  decreased breath sounds but clear to auscultation bilaterally, normal work of breathing ?GI: soft, nontender, nondistended, + BS ?Ext: plus 1 edema otherwise without cyanosis, clubbing,Good distal pulses bilaterally ?Neuro:  Alert and Oriented x 3,  ?Psych: euthymic mood, full affect ? ?Wt Readings from Last 3 Encounters:  ?11/07/21 149 lb (67.6 kg)  ?09/25/21 153 lb 6.4 oz (69.6 kg)  ?07/31/21 155 lb (70.3 kg)  ?  ? ? ?Studies/Labs Reviewed:  ? ?EKG:  EKG is not ordered today.    ? ?Recent Labs: ?04/10/2021: NT-Pro BNP 179 ?06/19/2021: Magnesium 1.8 ?06/23/2021: ALT 30; B Natriuretic Peptide 50.3 ?07/04/2021: Platelets 234 ?07/10/2021: TSH 1.49 ?07/24/2021: BUN 12; Creatinine, Ser 0.72 ?09/25/2021: Hemoglobin 12.2; Potassium 4.4; Sodium 136  ? ?Lipid Panel ?No results found for: CHOL, TRIG, HDL, CHOLHDL, VLDL, LDLCALC, LDLDIRECT ? ?Additional studies/ records that were reviewed today include:  ?CTA 06/19/21: ?FINDINGS: ?Cardiovascular: The quality of this exam for evaluation of pulmonary ?embolism is good. Mild motion degradation. No  evidence of pulmonary ?embolism. Apparent vascular filling defect on 331/7 in the right ?lower lobe is secondary to fluid within the endobronchial tree and ?is similar to on the prior. ?  ?Aortic atherosclerosis. Tortuous thoracic aorta. Normal heart size, ?without pericardial effusion. Three vessel coronary artery ?calcification. ?  ?Mediastinum/Nodes: No mediastinal or hilar adenopathy. ?  ?Lungs/Pleura: No pleural fluid.  Advanced bullous emphysema. ?  ?Bibasilar dependent airspace and less so ground-glass opacity, new ?since the prior CT. ?  ?Upper Abdomen: Normal imaged portions of the liver, spleen, stomach ?pancreas, adrenal glands. Upper pole left renal 1.2 cm incompletely ?imaged low-density lesion is likely a cyst. ?  ?Musculoskeletal: No acute osseous abnormality. ?  ?Review of the MIP images confirms the above findings. ?  ?IMPRESSION: ?1. No evidence of pulmonary embolism. Mild motion degradation. ?2. New bibasilar airspace  and less so ground-glass opacity, favored ?to represent aspiration or infection. ?3. Aortic Atherosclerosis (ICD10-I70.0) and Emphysema (ICD10-J43.9). ?Coronary artery atherosclerosis. ?  ?  ?LHC/RHC 06/15/21: ?Right and left heart cath 06/14/21: ?  Dist LAD lesion is 30% stenosed. ?  Mid RCA lesion is 40% stenosed. ?  LV end diastolic pressure is normal. ?  There is no aortic valve stenosis. ?  ?Nonobstructive CAD ?Normal LV filling pressures with EDP 15 mm Hg ?Normal RA pressure. Mildly elevated RV pressure 48/5 mm Hg. Unable to measure PA pressure due to development of complete heart block. 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 ?  ?Plan: will admit to unit for continued monitoring. Continue transvenous pacing. Hold beta blocker.  ?  ?TTE 08/18/20: ?IMPRESSIONS  ? 1. Left ventricular ejection fraction, by estimation, is 40 to 45%. The  ?left ventricle has mildly decreased function. The left ventricle  ?demonstrates regional wall motion  abnormalities (see scoring  ?diagram/findings for description). Left ventricular  ?diastolic parameters are consistent with Grade I diastolic dysfunction  ?(impaired relaxation).  ? 2. Right ventricular systolic function is n

## 2021-10-30 DIAGNOSIS — R2689 Other abnormalities of gait and mobility: Secondary | ICD-10-CM | POA: Diagnosis not present

## 2021-10-30 DIAGNOSIS — M6281 Muscle weakness (generalized): Secondary | ICD-10-CM | POA: Diagnosis not present

## 2021-10-31 DIAGNOSIS — J449 Chronic obstructive pulmonary disease, unspecified: Secondary | ICD-10-CM | POA: Diagnosis not present

## 2021-10-31 DIAGNOSIS — M6281 Muscle weakness (generalized): Secondary | ICD-10-CM | POA: Diagnosis not present

## 2021-11-01 DIAGNOSIS — M6281 Muscle weakness (generalized): Secondary | ICD-10-CM | POA: Diagnosis not present

## 2021-11-02 DIAGNOSIS — E785 Hyperlipidemia, unspecified: Secondary | ICD-10-CM | POA: Diagnosis not present

## 2021-11-02 DIAGNOSIS — I10 Essential (primary) hypertension: Secondary | ICD-10-CM | POA: Diagnosis not present

## 2021-11-02 DIAGNOSIS — J449 Chronic obstructive pulmonary disease, unspecified: Secondary | ICD-10-CM | POA: Diagnosis not present

## 2021-11-02 DIAGNOSIS — J9611 Chronic respiratory failure with hypoxia: Secondary | ICD-10-CM | POA: Diagnosis not present

## 2021-11-02 DIAGNOSIS — I509 Heart failure, unspecified: Secondary | ICD-10-CM | POA: Diagnosis not present

## 2021-11-02 DIAGNOSIS — E1169 Type 2 diabetes mellitus with other specified complication: Secondary | ICD-10-CM | POA: Diagnosis not present

## 2021-11-03 ENCOUNTER — Encounter: Payer: Self-pay | Admitting: Pulmonary Disease

## 2021-11-05 DIAGNOSIS — M6281 Muscle weakness (generalized): Secondary | ICD-10-CM | POA: Diagnosis not present

## 2021-11-05 DIAGNOSIS — R2689 Other abnormalities of gait and mobility: Secondary | ICD-10-CM | POA: Diagnosis not present

## 2021-11-05 NOTE — Telephone Encounter (Signed)
Routing to [place] to be on lookout for paperwork.  ?

## 2021-11-06 DIAGNOSIS — J449 Chronic obstructive pulmonary disease, unspecified: Secondary | ICD-10-CM | POA: Diagnosis not present

## 2021-11-06 DIAGNOSIS — M6281 Muscle weakness (generalized): Secondary | ICD-10-CM | POA: Diagnosis not present

## 2021-11-07 ENCOUNTER — Ambulatory Visit (INDEPENDENT_AMBULATORY_CARE_PROVIDER_SITE_OTHER): Payer: Medicare HMO

## 2021-11-07 ENCOUNTER — Ambulatory Visit: Payer: Medicare HMO | Admitting: Physician Assistant

## 2021-11-07 VITALS — BP 178/54 | HR 90 | Wt 149.0 lb

## 2021-11-07 DIAGNOSIS — I447 Left bundle-branch block, unspecified: Secondary | ICD-10-CM | POA: Diagnosis not present

## 2021-11-07 DIAGNOSIS — R000 Tachycardia, unspecified: Secondary | ICD-10-CM

## 2021-11-07 DIAGNOSIS — I428 Other cardiomyopathies: Secondary | ICD-10-CM | POA: Diagnosis not present

## 2021-11-07 DIAGNOSIS — I5022 Chronic systolic (congestive) heart failure: Secondary | ICD-10-CM | POA: Diagnosis not present

## 2021-11-07 DIAGNOSIS — E785 Hyperlipidemia, unspecified: Secondary | ICD-10-CM

## 2021-11-07 DIAGNOSIS — I2510 Atherosclerotic heart disease of native coronary artery without angina pectoris: Secondary | ICD-10-CM

## 2021-11-07 DIAGNOSIS — I5020 Unspecified systolic (congestive) heart failure: Secondary | ICD-10-CM

## 2021-11-07 DIAGNOSIS — I10 Essential (primary) hypertension: Secondary | ICD-10-CM

## 2021-11-07 DIAGNOSIS — I442 Atrioventricular block, complete: Secondary | ICD-10-CM

## 2021-11-07 DIAGNOSIS — E118 Type 2 diabetes mellitus with unspecified complications: Secondary | ICD-10-CM | POA: Diagnosis not present

## 2021-11-07 DIAGNOSIS — J449 Chronic obstructive pulmonary disease, unspecified: Secondary | ICD-10-CM | POA: Diagnosis not present

## 2021-11-07 DIAGNOSIS — Z79899 Other long term (current) drug therapy: Secondary | ICD-10-CM

## 2021-11-07 MED ORDER — POTASSIUM CHLORIDE CRYS ER 10 MEQ PO TBCR: 10.0000 meq | EXTENDED_RELEASE_TABLET | Freq: Two times a day (BID) | ORAL | 2 refills | Status: AC

## 2021-11-07 MED ORDER — METOPROLOL TARTRATE 25 MG PO TABS: 12.5000 mg | ORAL_TABLET | Freq: Two times a day (BID) | ORAL | 0 refills | Status: DC

## 2021-11-07 MED ORDER — TORSEMIDE 20 MG PO TABS: 20.0000 mg | ORAL_TABLET | Freq: Every day | ORAL | 0 refills | Status: DC

## 2021-11-07 NOTE — Progress Notes (Unsigned)
Enrolled patient for a 14 day Zio XT  monitor to be mailed to patients home  °

## 2021-11-07 NOTE — Patient Instructions (Addendum)
Medication Instructions:  ? ?START TAKING: METOPROLOL 12.5 MG TWICE A DAY  ? ?START TAKING: POTASSIUM 10 MEQ TWICE A DAY  ? ?START TAKING : TORSEMIDE 20 MG ONCE A DAY  ? ?STOP TAKING AND REMOVE THIS MEDICATION FROM YOUR MEDICATION LIST:  CORLANOR 5 MG  AND FUROSEMIDE 20 MG  ? ?*If you need a refill on your cardiac medications before your next appointment, please call your pharmacy* ? ? ?Lab Work:  BMET CBC AND BNP TODAY  ? ?If you have labs (blood work) drawn today and your tests are completely normal, you will receive your results only by: ?MyChart Message (if you have MyChart) OR ?A paper copy in the mail ?If you have any lab test that is abnormal or we need to change your treatment, we will call you to review the results. ? ? ?Testing/Procedures: Your physician has recommended that you wear an event monitor. Event monitors are medical devices that record the heart?s electrical activity. Doctors most often [place] these monitors to diagnose arrhythmias. Arrhythmias are problems with the speed or rhythm of the heartbeat. The monitor is a small, portable device. You can wear one while you do your normal daily activities. This is usually used to diagnose what is causing palpitations/syncope (passing out). ? ? ? ? ?Follow-Up: ?At [facility], you and your health needs are our priority.  As part of our continuing mission to provide you with exceptional heart care, we have created designated Provider Care Teams.  These Care Teams include your primary Cardiologist (physician) and Advanced Practice Providers (APPs -  Physician Assistants and Nurse Practitioners) who all work together to provide you with the care you need, when you need it. ? ?We recommend signing up for the patient portal called "MyChart".  Sign up information is provided on this After Visit Summary.  MyChart is used to connect with patients for Virtual Visits (Telemedicine).  Patients are able to view lab/test results, encounter notes, upcoming  appointments, etc.  Non-urgent messages can be sent to your provider as well.   ?To learn more about what you can do with MyChart, go to [email or web address].   ? ?Your next appointment:   ?1 month(s) ? ?The format for your next appointment:   ?In Person ? ?Provider:   ?Jacolyn Reedy, PA-C     Then, Meriam Sprague, MD will plan to see you again in 1 month(s).  ? ? ?Other Instructions ? ? ?Important Information About [employer] ? ? ? ? ?  ?ZIO XT- Long Term Monitor Instructions ? ?Your physician has requested you wear a ZIO patch monitor for 14 days.  ?This is a single patch monitor. [employer] supplies one patch monitor per enrollment. Additional ?stickers are not available. Please do not apply patch if you will be having a Nuclear Stress Test,  ?Echocardiogram, Cardiac CT, MRI, or Chest Xray during the period you would be wearing the  ?monitor. The patch cannot be worn during these tests. You cannot remove and re-apply the  ?ZIO XT patch monitor.  ?Your ZIO patch monitor will be mailed 3 day USPS to your address on file. It may take 3-5 days  ?to receive your monitor after you have been enrolled.  ?Once you have received your monitor, please review the enclosed instructions. Your monitor  ?has already been registered assigning a specific monitor serial # to you. ? ?Billing and Patient Assistance Program Information ? ?We have supplied [employer] with any of your insurance information on file for billing purposes. ?[employer]  offers a sliding scale Patient Assistance Program for patients that do not have  ?insurance, or whose insurance does not completely cover the cost of the ZIO monitor.  ?You must apply for the Patient Assistance Program to qualify for this discounted rate.  ?To apply, please call [employer] at [phone], select option 4, select option 2, ask to apply for  ?Patient Assistance Program. Meredeth Ide will ask your household income, and how many people  ?are in your household. They will quote your out-of-pocket  cost based on that information.  ?[employer] will also be able to set up a 42-month, interest-free payment plan if needed. ? ?Applying the monitor ?  ?Shave hair from upper left chest.  ?Hold abrader disc by orange tab. Rub abrader in 40 strokes over the upper left chest as  ?indicated in your monitor instructions.  ?Clean area with 4 enclosed alcohol pads. Let dry.  ?Apply patch as indicated in monitor instructions. Patch will be placed under collarbone on left  ?side of chest with arrow pointing upward.  ?Rub patch adhesive wings for 2 minutes. Remove white label marked "1". Remove the white  ?label marked "2". Rub patch adhesive wings for 2 additional minutes.  ?While looking in a mirror, press and release button in center of patch. A small green light will  ?flash 3-4 times. This will be your only indicator that the monitor has been turned on.  ?Do not shower for the first 24 hours. You may shower after the first 24 hours.  ?Press the button if you feel a symptom. You will hear a small click. Record Date, Time and  ?Symptom in the Patient Logbook.  ?When you are ready to remove the patch, follow instructions on the last 2 pages of Patient  ?Logbook. Stick patch monitor onto the last page of Patient Logbook.  ?Place Patient Logbook in the blue and white box. Use locking tab on box and tape box closed  ?securely. The blue and white box has prepaid postage on it. Please place it in the mailbox as  ?soon as possible. Your physician should have your test results approximately 7 days after the  ?monitor has been mailed back to [facility].  ?Call [facility] at [phone] if you have questions regarding  ?your ZIO XT patch monitor. Call them immediately if you see an orange light blinking on your  ?monitor.  ?If your monitor falls off in less than 4 days, contact our Monitor department at [phone].  ?If your monitor becomes loose or falls off after 4 days call [employer] at [phone] for   ?suggestions on securing your monitor ? ?

## 2021-11-08 DIAGNOSIS — M6281 Muscle weakness (generalized): Secondary | ICD-10-CM | POA: Diagnosis not present

## 2021-11-08 LAB — PRO B NATRIURETIC PEPTIDE: NT-Pro BNP: 279 pg/mL (ref 0–738)

## 2021-11-08 LAB — CBC
Hematocrit: 32.1 % — ABNORMAL LOW (ref 34.0–46.6)
Hemoglobin: 10.3 g/dL — ABNORMAL LOW (ref 11.1–15.9)
MCH: 30.4 pg (ref 26.6–33.0)
MCHC: 32.1 g/dL (ref 31.5–35.7)
MCV: 95 fL (ref 79–97)
Platelets: 238 10*3/uL (ref 150–450)
RBC: 3.39 x10E6/uL — ABNORMAL LOW (ref 3.77–5.28)
RDW: 13.3 % (ref 11.7–15.4)
WBC: 6.5 10*3/uL (ref 3.4–10.8)

## 2021-11-08 LAB — BASIC METABOLIC PANEL
BUN/Creatinine Ratio: 28 (ref 12–28)
BUN: 18 mg/dL (ref 8–27)
CO2: 34 mmol/L — ABNORMAL HIGH (ref 20–29)
Calcium: 10.1 mg/dL (ref 8.7–10.3)
Chloride: 93 mmol/L — ABNORMAL LOW (ref 96–106)
Creatinine, Ser: 0.65 mg/dL (ref 0.57–1.00)
Glucose: 97 mg/dL (ref 70–99)
Potassium: 4.3 mmol/L (ref 3.5–5.2)
Sodium: 143 mmol/L (ref 134–144)
eGFR: 89 mL/min/{1.73_m2} (ref >59)

## 2021-11-09 DIAGNOSIS — M6281 Muscle weakness (generalized): Secondary | ICD-10-CM | POA: Diagnosis not present

## 2021-11-10 DIAGNOSIS — R000 Tachycardia, unspecified: Secondary | ICD-10-CM

## 2021-11-12 ENCOUNTER — Telehealth: Payer: Self-pay | Admitting: Pulmonary Disease

## 2021-11-12 DIAGNOSIS — M6281 Muscle weakness (generalized): Secondary | ICD-10-CM | POA: Diagnosis not present

## 2021-11-12 DIAGNOSIS — J449 Chronic obstructive pulmonary disease, unspecified: Secondary | ICD-10-CM

## 2021-11-12 MED ORDER — IPRATROPIUM BROMIDE 0.02 % IN SOLN: RESPIRATORY_TRACT | 11 refills | Status: DC

## 2021-11-12 NOTE — Addendum Note (Signed)
Addended by: Jacquiline Doe on: 11/12/2021 04:15 PM ? ? Modules accepted: Orders ? ?

## 2021-11-12 NOTE — Telephone Encounter (Signed)
New ipratropium neb prescription electronically sent to [facility] pharmacy.  Left detailed message of patient VM to let her know prescription has been resent and to call office with any issues. ?

## 2021-11-13 DIAGNOSIS — M6281 Muscle weakness (generalized): Secondary | ICD-10-CM | POA: Diagnosis not present

## 2021-11-13 DIAGNOSIS — R2689 Other abnormalities of gait and mobility: Secondary | ICD-10-CM | POA: Diagnosis not present

## 2021-11-14 ENCOUNTER — Encounter: Payer: Self-pay | Admitting: Pulmonary Disease

## 2021-11-14 DIAGNOSIS — J449 Chronic obstructive pulmonary disease, unspecified: Secondary | ICD-10-CM

## 2021-11-14 MED ORDER — IPRATROPIUM BROMIDE 0.02 % IN SOLN: RESPIRATORY_TRACT | 11 refills | Status: DC

## 2021-11-15 ENCOUNTER — Telehealth: Payer: Self-pay | Admitting: Pulmonary Disease

## 2021-11-15 DIAGNOSIS — M6281 Muscle weakness (generalized): Secondary | ICD-10-CM | POA: Diagnosis not present

## 2021-11-15 DIAGNOSIS — R2689 Other abnormalities of gait and mobility: Secondary | ICD-10-CM | POA: Diagnosis not present

## 2021-11-15 MED ORDER — IPRATROPIUM BROMIDE 0.02 % IN SOLN: RESPIRATORY_TRACT | 11 refills | Status: DC

## 2021-11-15 MED ORDER — IPRATROPIUM BROMIDE 0.02 % IN SOLN: 0.2500 mg | Freq: Four times a day (QID) | RESPIRATORY_TRACT | 11 refills | Status: DC

## 2021-11-15 NOTE — Addendum Note (Signed)
Addended by: Claudette Head A on: 11/15/2021 01:54 PM ? ? Modules accepted: Orders ? ?

## 2021-11-15 NOTE — Telephone Encounter (Signed)
Updated Atrovent Rx sent to [facility] pharmacy.  ?Morrie Sheldon with [facility] is aware and voiced his understanding.  ?Nothing further needed.  ? ?

## 2021-11-15 NOTE — Addendum Note (Signed)
Addended by: Wyvonne Lenz on: 11/15/2021 04:38 PM ? ? Modules accepted: Orders ? ?

## 2021-11-16 DIAGNOSIS — M6281 Muscle weakness (generalized): Secondary | ICD-10-CM | POA: Diagnosis not present

## 2021-11-16 DIAGNOSIS — R2689 Other abnormalities of gait and mobility: Secondary | ICD-10-CM | POA: Diagnosis not present

## 2021-11-18 ENCOUNTER — Encounter: Payer: Self-pay | Admitting: Pulmonary Disease

## 2021-11-18 DIAGNOSIS — J449 Chronic obstructive pulmonary disease, unspecified: Secondary | ICD-10-CM

## 2021-11-19 DIAGNOSIS — M6281 Muscle weakness (generalized): Secondary | ICD-10-CM | POA: Diagnosis not present

## 2021-11-19 DIAGNOSIS — R2689 Other abnormalities of gait and mobility: Secondary | ICD-10-CM | POA: Diagnosis not present

## 2021-11-19 NOTE — Telephone Encounter (Signed)
Dr. Vassie Loll should ipratropium be taken 3 or 4 time a day?  ?

## 2021-11-20 ENCOUNTER — Telehealth: Payer: Self-pay | Admitting: Pulmonary Disease

## 2021-11-20 DIAGNOSIS — M6281 Muscle weakness (generalized): Secondary | ICD-10-CM | POA: Diagnosis not present

## 2021-11-21 ENCOUNTER — Other Ambulatory Visit: Payer: Self-pay

## 2021-11-21 DIAGNOSIS — M6281 Muscle weakness (generalized): Secondary | ICD-10-CM | POA: Diagnosis not present

## 2021-11-21 DIAGNOSIS — J449 Chronic obstructive pulmonary disease, unspecified: Secondary | ICD-10-CM

## 2021-11-21 MED ORDER — IPRATROPIUM BROMIDE 0.02 % IN SOLN: 0.2500 mg | Freq: Four times a day (QID) | RESPIRATORY_TRACT | 11 refills | Status: DC

## 2021-11-21 MED ORDER — IPRATROPIUM BROMIDE 0.02 % IN SOLN: 0.2500 mg | RESPIRATORY_TRACT | 11 refills | Status: DC | PRN

## 2021-11-21 NOTE — Telephone Encounter (Signed)
Called and confirmed pharmacy with patient. She wanted ipratropium to go to [facility].  ?Confirmed that refill was sent with the directions.  ?Called [facility] and spoke to pharmacy tech and cancelled RX of ipratropium.  ?Nothing further needed.  ?

## 2021-11-22 DIAGNOSIS — R2689 Other abnormalities of gait and mobility: Secondary | ICD-10-CM | POA: Diagnosis not present

## 2021-11-22 DIAGNOSIS — M6281 Muscle weakness (generalized): Secondary | ICD-10-CM | POA: Diagnosis not present

## 2021-11-23 DIAGNOSIS — M6281 Muscle weakness (generalized): Secondary | ICD-10-CM | POA: Diagnosis not present

## 2021-11-26 DIAGNOSIS — M6281 Muscle weakness (generalized): Secondary | ICD-10-CM | POA: Diagnosis not present

## 2021-11-26 DIAGNOSIS — R2689 Other abnormalities of gait and mobility: Secondary | ICD-10-CM | POA: Diagnosis not present

## 2021-11-27 DIAGNOSIS — M6281 Muscle weakness (generalized): Secondary | ICD-10-CM | POA: Diagnosis not present

## 2021-11-28 DIAGNOSIS — M6281 Muscle weakness (generalized): Secondary | ICD-10-CM | POA: Diagnosis not present

## 2021-11-29 ENCOUNTER — Other Ambulatory Visit: Payer: Self-pay | Admitting: Physician Assistant

## 2021-11-29 DIAGNOSIS — M6281 Muscle weakness (generalized): Secondary | ICD-10-CM | POA: Diagnosis not present

## 2021-11-29 DIAGNOSIS — R000 Tachycardia, unspecified: Secondary | ICD-10-CM | POA: Diagnosis not present

## 2021-11-29 DIAGNOSIS — R2689 Other abnormalities of gait and mobility: Secondary | ICD-10-CM | POA: Diagnosis not present

## 2021-11-30 DIAGNOSIS — M6281 Muscle weakness (generalized): Secondary | ICD-10-CM | POA: Diagnosis not present

## 2021-12-03 DIAGNOSIS — M6281 Muscle weakness (generalized): Secondary | ICD-10-CM | POA: Diagnosis not present

## 2021-12-04 DIAGNOSIS — M6281 Muscle weakness (generalized): Secondary | ICD-10-CM | POA: Diagnosis not present

## 2021-12-04 DIAGNOSIS — R2689 Other abnormalities of gait and mobility: Secondary | ICD-10-CM | POA: Diagnosis not present

## 2021-12-04 NOTE — Telephone Encounter (Signed)
Pt aware may take extra Metoprolol 25 mg as needed for fast heart rate .Rebecca [name]

## 2021-12-04 NOTE — Telephone Encounter (Signed)
Lm to call back ./cy 

## 2021-12-04 NOTE — Telephone Encounter (Signed)
Patient is returning call.  °

## 2021-12-05 DIAGNOSIS — M6281 Muscle weakness (generalized): Secondary | ICD-10-CM | POA: Diagnosis not present

## 2021-12-06 DIAGNOSIS — J449 Chronic obstructive pulmonary disease, unspecified: Secondary | ICD-10-CM | POA: Diagnosis not present

## 2021-12-07 DIAGNOSIS — R2689 Other abnormalities of gait and mobility: Secondary | ICD-10-CM | POA: Diagnosis not present

## 2021-12-07 DIAGNOSIS — M6281 Muscle weakness (generalized): Secondary | ICD-10-CM | POA: Diagnosis not present

## 2021-12-10 DIAGNOSIS — R2689 Other abnormalities of gait and mobility: Secondary | ICD-10-CM | POA: Diagnosis not present

## 2021-12-10 DIAGNOSIS — M6281 Muscle weakness (generalized): Secondary | ICD-10-CM | POA: Diagnosis not present

## 2021-12-11 DIAGNOSIS — M6281 Muscle weakness (generalized): Secondary | ICD-10-CM | POA: Diagnosis not present

## 2021-12-11 DIAGNOSIS — J449 Chronic obstructive pulmonary disease, unspecified: Secondary | ICD-10-CM | POA: Diagnosis not present

## 2021-12-11 DIAGNOSIS — J432 Centrilobular emphysema: Secondary | ICD-10-CM | POA: Diagnosis not present

## 2021-12-12 ENCOUNTER — Ambulatory Visit: Payer: Medicare HMO | Admitting: Physician Assistant

## 2021-12-12 DIAGNOSIS — R2689 Other abnormalities of gait and mobility: Secondary | ICD-10-CM | POA: Diagnosis not present

## 2021-12-12 DIAGNOSIS — M6281 Muscle weakness (generalized): Secondary | ICD-10-CM | POA: Diagnosis not present

## 2021-12-13 DIAGNOSIS — M6281 Muscle weakness (generalized): Secondary | ICD-10-CM | POA: Diagnosis not present

## 2021-12-14 DIAGNOSIS — M6281 Muscle weakness (generalized): Secondary | ICD-10-CM | POA: Diagnosis not present

## 2021-12-17 DIAGNOSIS — M6281 Muscle weakness (generalized): Secondary | ICD-10-CM | POA: Diagnosis not present

## 2021-12-17 DIAGNOSIS — R2689 Other abnormalities of gait and mobility: Secondary | ICD-10-CM | POA: Diagnosis not present

## 2021-12-18 DIAGNOSIS — M6281 Muscle weakness (generalized): Secondary | ICD-10-CM | POA: Diagnosis not present

## 2021-12-19 DIAGNOSIS — M6281 Muscle weakness (generalized): Secondary | ICD-10-CM | POA: Diagnosis not present

## 2021-12-19 DIAGNOSIS — R2689 Other abnormalities of gait and mobility: Secondary | ICD-10-CM | POA: Diagnosis not present

## 2021-12-21 DIAGNOSIS — R2689 Other abnormalities of gait and mobility: Secondary | ICD-10-CM | POA: Diagnosis not present

## 2021-12-21 DIAGNOSIS — M6281 Muscle weakness (generalized): Secondary | ICD-10-CM | POA: Diagnosis not present

## 2021-12-24 DIAGNOSIS — M6281 Muscle weakness (generalized): Secondary | ICD-10-CM | POA: Diagnosis not present

## 2021-12-25 ENCOUNTER — Ambulatory Visit: Payer: Medicare HMO | Admitting: Physician Assistant

## 2021-12-25 DIAGNOSIS — M6281 Muscle weakness (generalized): Secondary | ICD-10-CM | POA: Diagnosis not present

## 2021-12-26 DIAGNOSIS — M6281 Muscle weakness (generalized): Secondary | ICD-10-CM | POA: Diagnosis not present

## 2021-12-26 DIAGNOSIS — R2689 Other abnormalities of gait and mobility: Secondary | ICD-10-CM | POA: Diagnosis not present

## 2021-12-27 DIAGNOSIS — M6281 Muscle weakness (generalized): Secondary | ICD-10-CM | POA: Diagnosis not present

## 2021-12-27 DIAGNOSIS — R2689 Other abnormalities of gait and mobility: Secondary | ICD-10-CM | POA: Diagnosis not present

## 2021-12-28 ENCOUNTER — Encounter: Payer: Self-pay | Admitting: Pulmonary Disease

## 2021-12-28 ENCOUNTER — Ambulatory Visit: Payer: Medicare HMO | Admitting: Pulmonary Disease

## 2021-12-28 ENCOUNTER — Telehealth: Payer: Self-pay | Admitting: General Practice

## 2021-12-28 ENCOUNTER — Telehealth: Payer: Self-pay | Admitting: Pulmonary Disease

## 2021-12-28 VITALS — BP 132/64 | HR 75 | Temp 98.4°F | Ht 62.0 in | Wt 150.2 lb

## 2021-12-28 DIAGNOSIS — J9611 Chronic respiratory failure with hypoxia: Secondary | ICD-10-CM | POA: Diagnosis not present

## 2021-12-28 DIAGNOSIS — J449 Chronic obstructive pulmonary disease, unspecified: Secondary | ICD-10-CM

## 2021-12-28 DIAGNOSIS — J9612 Chronic respiratory failure with hypercapnia: Secondary | ICD-10-CM

## 2021-12-28 DIAGNOSIS — Z515 Encounter for palliative care: Secondary | ICD-10-CM | POA: Diagnosis not present

## 2021-12-28 MED ORDER — PREDNISONE 10 MG PO TABS: ORAL_TABLET | ORAL | 0 refills | Status: DC

## 2021-12-28 MED ORDER — IPRATROPIUM BROMIDE 0.02 % IN SOLN: 0.2500 mg | Freq: Four times a day (QID) | RESPIRATORY_TRACT | 11 refills | Status: AC

## 2021-12-28 MED ORDER — ALPRAZOLAM 0.5 MG PO TABS: 0.2500 mg | ORAL_TABLET | Freq: Two times a day (BID) | ORAL | 5 refills | Status: DC | PRN

## 2021-12-28 NOTE — Assessment & Plan Note (Addendum)
Meanwhile poor prognosis was discussed with the patient and her daughter.  I discussed the possibility of using low-dose narcotics for persistent dyspnea but she is not ready for this.  I will initiate outpatient palliative care referral She was amenable to low-dose Xanax and we will send this prescription

## 2021-12-28 NOTE — Progress Notes (Signed)
   Subjective:    Patient ID: Rebecca [name], female    DOB: 1941-03-20, 81 y.o.   MRN: [id number]  HPI  81 yo ex-smoker for follow-up of COPD and chronic respiratory failure on oxygen since 2010   She smoked about a pack per day until she quit in 2003, more than 40 pack years PMH -nonischemic cardiomyopathy, last EF 46% 07/2020 ,diabetes and hypertension Lives in independent living at [facility] 6/ 2021 for COPD exacerbation and hypercarbic hypoxic respiratory failure required BiPAP support.   admitted 06/2021 for hemoptysis and acute on chronic respiratory failure.  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  Patient was felt to have possible aspiration pneumonia.  CT chest was negative for PE.  It showed bullous emphysema and bibasilar airspace opacities .She was treated with empiric antibiotics  She completed longer course of 5 mg of prednisone  Chief Complaint  Patient presents with   Follow-up    Feels like she keeps getting worse. Increased SOB but SAT stays ok. No coughing, wheezing, tightness. Insurance declined Bipap and want appeal   Last office visit, we sent in prescription for NIV for chronic hypercarbic respiratory failure Received denial from [facility] for NIV 'we do not have evidence of failure of a standard biPAP in AVAPS mode "   Sent request to DME for BiPAP - auto AVAPS mode EPAP min 5, IPAP max 20 , PS +4 But she has not received this yet and we have not received any denial from DME/insurance.  She arrives accompanied by her daughter today who corroborates history. She complains of persistent shortness of breath.  She is completing physical therapy.  On some days she can walk 500 feet with physical therapy, she uses a walker. She is compliant with budesonide and Brovana and uses Atrovent 3-4 times daily.  Some discrepancy with prescription that  was sent to [facility] for Atrovent  She is really frustrated and would like to know her prognosis.  I have reviewed phone notes and office visit with cardiology since her last visit with me  Significant tests/ events reviewed  ABG 09/2021 7.3 5/81/40 5/78% on 4 L nasal cannula,, corresponding saturation was 95% Spirometry 01/2018 showed severe airway obstruction with ratio 43, FEV1 of 0.57-31% and FVC of 53%.   CT angio 06/2018 LUL nodule 70mm   CT chest angio 12/2019 >> severe emphysema   Review of Systems neg for any significant sore throat, dysphagia, itching, sneezing, nasal congestion or excess/ purulent secretions, fever, chills, sweats, unintended wt loss, pleuritic or exertional cp, hempoptysis, orthopnea pnd or change in chronic leg swelling. Also denies presyncope, palpitations, heartburn, abdominal pain, nausea, vomiting, diarrhea or change in bowel or urinary habits, dysuria,hematuria, rash, arthralgias, visual complaints, headache, numbness weakness or ataxia.     Objective:   Physical Exam  Gen. Pleasant, well-nourished, elderly in no distress, depressed affect ENT - no pallor,icterus, no post nasal drip Neck: No JVD, no thyromegaly, no carotid bruits Lungs: no use of accessory muscles, no dullness to percussion, decreased without rales or rhonchi  Cardiovascular: Rhythm regular, heart sounds  normal, no murmurs or gallops, no peripheral edema Abdomen: soft and non-tender, no hepatosplenomegaly, BS normal. Musculoskeletal: No deformities, no cyanosis or clubbing Neuro:  [name], non focal       Assessment & Plan:   Total encounter time was 46 minutes

## 2021-12-28 NOTE — Assessment & Plan Note (Addendum)
Continue Brovana and budesonide. I offered to substitute Yupelri for Atrovent nebs but she prefers to take this 4 times daily.  We will confirm with [facility] that they have the right prescription I also will provide her with an extended dose of low-dose prednisone starting at 20 mg and rapidly tapered to 10 mg and maintain for about a month

## 2021-12-28 NOTE — Telephone Encounter (Signed)
Called [facility] to resolve issue with getting BiPap. They said they had an order for [facility] but had to look in to the BiPap order. The RT who manages all of the case research will look in to it and get back with Dr Vassie Loll if a new Rx is needed or call the patient if the BiPap is ready. Consandra Laske C 2:49 PM

## 2021-12-28 NOTE — Patient Instructions (Signed)
  X outpatient palliative care referral   X Rx for xanax 0.25 mg twice daily as needed  X Prednisone 10 mg tabs  Take 2 tabs daily with food x 5ds, then 1 tab daily with food x  60   X we will check with [facility] about bipap

## 2021-12-28 NOTE — Telephone Encounter (Signed)
Called and spoke with [facility] for clarification regarding the patient's Iprotropium neb solution.  She stated that it was just filled and shipped out to the patient.  She was not eligible for another refill.  She just wanted to let us know.  Nothing further needed.

## 2021-12-28 NOTE — Assessment & Plan Note (Signed)
It is unclear to me why NIV was denied by insurance.  She clearly has chronic hypercarbia and meets criteria.  It was stated that BiPAP should be tried first.  I have sent in a prescription for BiPAP and not clear why this has not been supplied yet. We will check with DME and insurance.

## 2021-12-31 ENCOUNTER — Encounter: Payer: Self-pay | Admitting: Pulmonary Disease

## 2021-12-31 DIAGNOSIS — M6281 Muscle weakness (generalized): Secondary | ICD-10-CM | POA: Diagnosis not present

## 2021-12-31 MED ORDER — PREDNISONE 10 MG PO TABS
ORAL_TABLET | ORAL | 0 refills | Status: AC
Start: 2021-12-31 — End: 2022-03-06

## 2021-12-31 MED ORDER — ALPRAZOLAM 0.5 MG PO TABS: 0.2500 mg | ORAL_TABLET | Freq: Two times a day (BID) | ORAL | 5 refills | Status: DC | PRN

## 2022-01-01 DIAGNOSIS — M6281 Muscle weakness (generalized): Secondary | ICD-10-CM | POA: Diagnosis not present

## 2022-01-01 DIAGNOSIS — J432 Centrilobular emphysema: Secondary | ICD-10-CM | POA: Diagnosis not present

## 2022-01-01 DIAGNOSIS — J449 Chronic obstructive pulmonary disease, unspecified: Secondary | ICD-10-CM | POA: Diagnosis not present

## 2022-01-01 NOTE — Progress Notes (Signed)
Cardiology Office Note    Date:  01/09/2022   ID:  Rebecca [name], DOB June 09, 1941, MRN [id number]   PCP:  London Pepper, MD   [place]  Cardiologist:  Freada Bergeron, MD   Advanced Practice Provider:  No care team member to display Electrophysiologist:  None   [phone]   Chief Complaint  Patient presents with   Follow-up    History of Present Illness:  Rebecca [name] is a 81 y.o. 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 She was told by her Cardiologist in [place] that her EF recovered and she did not need to follow up further.    DOE worse than her chronic SOB associated with her COPD for which she is on continuous O2. We obtained a TTE which revealed LVEF 40-45% with anteroseptal, mid inferoseptal and basal inferoseptal hypokinesis. Follow-up SPECT showed no evidence of ischemia or infarction but abnormal septal motion consistent with LBBB.     Was admitted 06/14/21-06/15/21 for planned cath due to persistent dyspnea on exertion despite treatment of COPD, known WMA and depressed EF. She underwent LHC/RHC which showed nonobstructive CAD, LVEDP 2mmHg, RV 48/5. Cath course complicated by CHB in the setting of known LBBB requiring transvenous pacing. This resolved and the patient was discharged home off BB.    Admitted with pneumonia 12/6-12/13/22. She saw Dr. Johney Frame and had self resumed her coreg for HR 120/s. Lasix increased 40 mg bid for 3 days, corlenor started, if can't afford will need to monitor for CHB on low dose metoprolol. No SGLT-2i with freq UTI's and off spiro due to hyperk.   I saw the patient 11/07/21 with worsening dyspnea and elevated BP.  Corlanor was not affordable and she wanted to go back on metoprolol.  Heart rate was going up with little activity and was racing when  she walked across the hall.  She has tried taking higher dose Lasix without help.  I added low-dose metoprolol and placed a 2 weeks ZIO which showed some fast heart rates 6 runs of NSVT and 2 runs of SVT.  I told her she could increase her metoprolol to 25 mg twice daily.  Patient comes in with a different daughter this visit. She hasn't noticed any improvement form the beta blocker. Whenever she uses her arms or walks to the bathroom she gets short of breath and heart races. She is having a lot of hurting in her joints and hand cramps that she thinks is coming from zetia as she's had problems with pravastatin, rosuvastatin. She saw Dr. Elsworth Soho 12/28/21 and was given predinsone, xanax, palliative care referal. He offered Yupelri for atrovent but patient didn't want to change.     Past Medical History:  Diagnosis Date   Congestive heart disease ([name])    COPD (chronic obstructive pulmonary disease) ([name])    Diabetes ([name]) with atherosclerosis    Hyperlipemia    Osteoporosis    Primary hypertension     Past Surgical History:  Procedure Laterality Date   RIGHT/LEFT HEART CATH AND CORONARY ANGIOGRAPHY N/A 06/14/2021   Procedure: RIGHT/LEFT HEART CATH AND CORONARY ANGIOGRAPHY;  Surgeon: [name], [name], MD;  Location: [facility];  Service: Cardiovascular;  Laterality: N/A;   TEMPORARY PACEMAKER N/A 06/14/2021   Procedure: TEMPORARY PACEMAKER;  Surgeon: [name], [name]  M, MD;  Location: [facility];  Service: Cardiovascular;  Laterality: N/A;    Current Medications: Current Meds  Medication Sig   acetaminophen (TYLENOL) 500 MG tablet Take 1,000 mg by mouth 2 (two) times daily as needed for mild pain or headache.   albuterol (PROVENTIL) (2.5 MG/3ML) 0.083% nebulizer solution INHALE 1 VIAL BY NEBULIZATION EVERY DAY IN THE MORNING, AT NOON, AND AT BEDTIME. USE AS DIRECTED.   albuterol (VENTOLIN HFA) 108 (90 Base) MCG/ACT inhaler Inhale 2 puffs into the lungs every 6 (six) hours as needed  for wheezing or shortness of breath.   ALPRAZolam (XANAX) 0.5 MG tablet Take 0.5 tablets (0.25 mg total) by mouth 2 (two) times daily as needed for anxiety.   arformoterol (BROVANA) 15 MCG/2ML NEBU USE 1 VIAL  IN  NEBULIZER TWICE  DAILY - morning and evening   aspirin EC 81 MG tablet Take 81 mg by mouth daily. Swallow whole.   bisacodyl (DULCOLAX) 10 MG suppository If not relieved by MOM, give 10 mg Bisacodyl suppositiory rectally X 1 dose in 24 hours as needed   brimonidine (ALPHAGAN) 0.2 % ophthalmic solution Place 1 drop into the left eye 2 (two) times daily.   budesonide (PULMICORT) 0.5 MG/2ML nebulizer solution USE 1 VIAL  IN  NEBULIZER TWICE  DAILY - Rinse mouth after treatment   Calcium Carbonate-Vitamin D 600-200 MG-UNIT TABS Take 1 tablet by mouth every evening.   carboxymethylcellulose (REFRESH PLUS) 0.5 % SOLN Place 1 drop into both eyes 3 (three) times daily as needed (dry eyes).   docusate sodium (COLACE) 100 MG capsule Take 100 mg by mouth daily.   ipratropium (ATROVENT) 0.02 % nebulizer solution Take 1.25 mLs (0.25 mg total) by nebulization in the morning, at noon, in the evening, and at bedtime. USE 1 VIAL IN NEBULIZER 3 TIMES DAILY   losartan (COZAAR) 50 MG tablet Take 1 tablet (50 mg total) by mouth daily.   magnesium hydroxide (MILK OF MAGNESIA) 400 MG/5ML suspension If no BM in 3 days, give 30 cc Milk of Magnesium p.o. x 1 dose in 24 hours as needed   metFORMIN (GLUCOPHAGE) 500 MG tablet Take 1 tablet (500 mg total) by mouth daily with supper.   metoprolol tartrate (LOPRESSOR) 25 MG tablet Take 0.5 tablets (12.5 mg total) by mouth 2 (two) times daily.   Multiple Vitamins-Minerals (EYE VITAMINS PO) Take 1 tablet by mouth daily.   NON FORMULARY Diet:HH chopped/thin diet consistency   Polyethylene Glycol 3350 POWD by Does not apply route. TAKE 17GM IN 4-80z OF FLUID OF CHOICE ONCE DAILY FOR CONSTIPATION   potassium chloride (KLOR-CON M) 10 MEQ tablet Take 1 tablet (10 mEq total) by  mouth 2 (two) times daily.   predniSONE (DELTASONE) 10 MG tablet Take 2 tablets (20 mg total) by mouth daily with breakfast for 5 days, THEN 1 tablet (10 mg total) daily with breakfast.   senna (SENOKOT) 8.6 MG TABS tablet Take 2 tablets by mouth 2 (two) times daily.   Sodium Phosphates (RA SALINE ENEMA RE) If not relieved by Biscodyl suppository, give disposable Saline Enema rectally X 1 dose/24 hrs as needed. Contact MD as needed if no results from enema   torsemide (DEMADEX) 20 MG tablet Take 1 tablet (20 mg total) by mouth daily.   [DISCONTINUED] ezetimibe (ZETIA) 10 MG tablet Take 1 tablet (10 mg total) by mouth daily.     Allergies:   Gabapentin, Pravastatin sodium, Rosuvastatin calcium, Simvastatin, Amoxicillin, Lisinopril, Sulfacetamide, and Tetracyclines &  related   Social History   Socioeconomic History   Marital status: Widowed    Spouse name: Not on file   Number of children: Not on file   Years of education: Not on file   Highest education level: Not on file  Occupational History   Not on file  Tobacco Use   Smoking status: Former    Packs/day: 1.50    Years: 40.00    Total pack years: 60.00    Types: Cigarettes    Quit date: 2003    Years since quitting: 20.5   Smokeless tobacco: Never  Vaping Use   Vaping Use: Never used  Substance and Sexual Activity   Alcohol use: Yes    Alcohol/week: 1.0 standard drink of alcohol    Types: 1 Glasses of wine per week   Drug use: Never   Sexual activity: Not on file  Other Topics Concern   Not on file  Social History Narrative   Not on file   Social Determinants of Health   Financial Resource Strain: Not on file  Food Insecurity: Not on file  Transportation Needs: Not on file  Physical Activity: Not on file  Stress: Not on file  Social Connections: Not on file     Family History:  The patient's  family history is not on file.   ROS:   Please see the history of present illness.    ROS All other systems reviewed  and are negative.   PHYSICAL EXAM:   VS:  BP (!) 150/64   Pulse 83   Ht 5\' 2"  (1.575 m)   Wt 148 lb 12.8 oz (67.5 kg)   SpO2 98%   BMI 27.22 kg/m   Physical Exam  GEN: Well nourished, well developed, in no acute distress, on O2  Neck: no JVD, carotid bruits, or masses Cardiac:RRR; no murmurs, rubs, or gallops  Respiratory:  decreased breath sounds throughout, no wheezing, rhonchi, rales GI: soft, nontender, nondistended, + BS Ext: without cyanosis, clubbing, or edema, Good distal pulses bilaterally Neuro:  Alert and Oriented x 3,  Psych: euthymic mood, full affect  Wt Readings from Last 3 Encounters:  01/09/22 148 lb 12.8 oz (67.5 kg)  12/28/21 150 lb 3.2 oz (68.1 kg)  11/07/21 149 lb (67.6 kg)      Studies/Labs Reviewed:   EKG:  EKG is not ordered today.    Recent Labs: 06/19/2021: Magnesium 1.8 06/23/2021: ALT 30; B Natriuretic Peptide 50.3 07/10/2021: TSH 1.49 11/07/2021: BUN 18; Creatinine, Ser 0.65; Hemoglobin 10.3; NT-Pro BNP 279; Platelets 238; Potassium 4.3; Sodium 143   Lipid Panel No results found for: "CHOL", "TRIG", "HDL", "CHOLHDL", "VLDL", "LDLCALC", "LDLDIRECT"  Additional studies/ records that were reviewed today include:  2 weeks ZIO 11/2021 Patch wear time was 12 days and 22 hours Predominant rhythm was NSR with average HR 85bpm There were 6 runs of nonsustained VT with longest lasting 5 beats There were 2 runs of SVT with longest lasting 5 beats Rare SVE, rare VE (<1%) Patient triggered events correlate with NSR, sinus tach and PVCs No Afib or sustained arrhythmias     Patch Wear Time:  12 days and 22 hours (2023-04-29T10:59:35-0400 to 2023-05-12T09:47:16-0400)   Patient had a min HR of 69 bpm, max HR of 210 bpm, and avg HR of 85 bpm. Predominant underlying rhythm was Sinus Rhythm. Bundle Branch Block/IVCD was present. 6 Ventricular Tachycardia runs occurred, the run with the fastest interval lasting 5 beats with  a max rate of  210 bpm, the  longest lasting 10 beats with an avg rate of 153 bpm. 2 Supraventricular Tachycardia runs occurred, the run with the fastest interval lasting 5 beats with a max rate of 136 bpm (avg 121 bpm); the run with the fastest interval  was also the longest. Idioventricular Rhythm was present. Isolated SVEs were rare (<1.0%), SVE Couplets were rare (<1.0%), and SVE Triplets were rare (<1.0%). Isolated VEs were rare (<1.0%, 1685), VE Couplets were rare (<1.0%, 18), and VE Triplets were  rare (<1.0%, 2).   Gwyndolyn Kaufman, MD  CTA 06/19/21: FINDINGS: Cardiovascular: The quality of this exam for evaluation of pulmonary embolism is good. Mild motion degradation. No evidence of pulmonary embolism. Apparent vascular filling defect on 331/7 in the right lower lobe is secondary to fluid within the endobronchial tree and is similar to on the prior.   Aortic atherosclerosis. Tortuous thoracic aorta. Normal heart size, without pericardial effusion. Three vessel coronary artery calcification.   Mediastinum/Nodes: No mediastinal or hilar adenopathy.   Lungs/Pleura: No pleural fluid.  Advanced bullous emphysema.   Bibasilar dependent airspace and less so ground-glass opacity, new since the prior CT.   Upper Abdomen: Normal imaged portions of the liver, spleen, stomach pancreas, adrenal glands. Upper pole left renal 1.2 cm incompletely imaged low-density lesion is likely a cyst.   Musculoskeletal: No acute osseous abnormality.   Review of the MIP images confirms the above findings.   IMPRESSION: 1. No evidence of pulmonary embolism. Mild motion degradation. 2. New bibasilar airspace and less so ground-glass opacity, favored to represent aspiration or infection. 3. Aortic Atherosclerosis (ICD10-I70.0) and Emphysema (ICD10-J43.9). Coronary artery atherosclerosis.     LHC/RHC 06/15/21: Right and left heart cath 06/14/21:   Dist LAD lesion is 30% stenosed.   Mid RCA lesion is 40% stenosed.   LV end  diastolic pressure is normal.   There is no aortic valve stenosis.   Nonobstructive CAD Normal LV filling pressures with EDP 15 mm Hg Normal RA pressure. Mildly elevated RV pressure 48/5 mm Hg. Unable to measure PA pressure due to development of complete heart block. 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   Plan: will admit to unit for continued monitoring. Continue transvenous pacing. Hold beta blocker.    TTE 08/18/20: IMPRESSIONS   1. Left ventricular ejection fraction, by estimation, is 40 to 45%. The  left ventricle has mildly decreased function. The left ventricle  demonstrates regional wall motion abnormalities (see scoring  diagram/findings for description). Left ventricular  diastolic parameters are consistent with Grade I diastolic dysfunction  (impaired relaxation).   2. Right ventricular systolic function is normal. The right ventricular  size is normal.   3. The mitral valve is normal in structure. No evidence of mitral valve  regurgitation.   4. The aortic valve is grossly normal. Aortic valve regurgitation is not  visualized.   FINDINGS   Left Ventricle: Left ventricular ejection fraction, by estimation, is 40  to 45%. The left ventricle has mildly decreased function. The left  ventricle demonstrates regional wall motion abnormalities. The left  ventricular internal cavity size was small.  There is no left ventricular hypertrophy. Left ventricular diastolic  parameters are consistent with Grade I diastolic dysfunction (impaired  relaxation).      LV Wall Scoring:  The anterior septum, mid inferoseptal segment, and basal inferoseptal  segment  are hypokinetic.   Right Ventricle: The right ventricular size is normal. No increase in  right ventricular wall  thickness. Right ventricular systolic function is  normal.   Left Atrium: Left atrial size was normal in size.   Right Atrium: Right atrial size was not well  visualized.   Pericardium: Trivial pericardial effusion is present.   Mitral Valve: The mitral valve is normal in structure. No evidence of  mitral valve regurgitation.   Tricuspid Valve: The tricuspid valve is normal in structure. Tricuspid  valve regurgitation is not demonstrated.   Aortic Valve: The aortic valve is grossly normal. There is mild aortic  valve annular calcification. Aortic valve regurgitation is not visualized.   Pulmonic Valve: The pulmonic valve was not well visualized. Pulmonic valve  regurgitation is not visualized.   Aorta: The aortic root is normal in size and structure and the ascending  aorta was not well visualized.   IAS/Shunts: The interatrial septum is aneurysmal. The atrial septum is  grossly normal.    Myoview 08/07/20: The left ventricular ejection fraction is mildly decreased (45-54%). Nuclear stress EF: 46%. There was no ST segment deviation noted during stress. No T wave inversion was noted during stress. The study is normal. This is a low risk study.   1. There are reduced counts in the septal segments on rest imaging that improve on stress imaging with normal wall motion consistent with left bundle branch block artifact. No evidence of ischemia or infarction.  2. Mildly reduced LVEF, 46%. Septal movement consistent with LBBB. 3. This is an low- to intermediate-risk study.    CTA chest 01/12/20: FINDINGS: Cardiovascular: Satisfactory opacification of the pulmonary arteries to the segmental level. No evidence of pulmonary embolism. Normal heart size. No pericardial effusion. Coronary artery calcifications are noted. Atherosclerosis of thoracic aorta is noted without aneurysm formation.   Mediastinum/Nodes: Small sliding-type hiatal hernia is noted. No adenopathy is noted. Thyroid gland is unremarkable.   Lungs/Pleura: No pneumothorax or pleural effusion is noted. 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 Mild  bilateral posterior basilar subsegmental atelectasis is noted.   Upper Abdomen: Bilateral nephrolithiasis is noted.   Musculoskeletal: No chest wall abnormality. No acute or significant osseous findings.   Review of the MIP images confirms the above findings.   IMPRESSION: 1. No definite evidence of pulmonary embolus. 2. Coronary artery calcifications are noted suggesting coronary artery disease. 3. Small sliding-type hiatal hernia. 4. Bilateral nephrolithiasis.       Risk Assessment/Calculations:         ASSESSMENT:    1. Chronic systolic CHF (congestive heart failure) (HCC)   2. NICM (nonischemic cardiomyopathy) (HCC)   3. Tachycardia   4. Coronary artery disease involving native coronary artery of native heart without angina pectoris   5. COPD with chronic bronchitis and emphysema (HCC)   6. Essential hypertension   7. Left bundle branch block   8. Hyperlipidemia, unspecified hyperlipidemia type   9. Type 2 diabetes mellitus with complication, without long-term current use of insulin (HCC)      PLAN:  In order of problems listed above:   Chronic Systolic Heart Failure with LVEF 40-45%/ History of viral cardiomyopathy/Wall Motion Abnormalities on TTE; no obstructive disease on cath  07/2020,  TTE showed LVEF 40-45% with septal and basal inferior wall hypokinesis. Myoview without ischemia or infarction.  RHC/LHC in 06/2021 nonobstructive CAD. RHC with mildly elevated RV systolic pressures. -Continue losartan 50mg  daily - corlenor stopped because she can't afford and feels worse on.   2 week Zio monitor  no recurrence of CHB -No SGLT-2i due to frequent UTIs -Off spiro  due to hyperK -Low Na diet   Tachcardia with 2 week Zio showing some NSVT/SVT-short runs, no recurrent CHF. I increased metoprolol 25 mg bid. Patient hasn't noticed any difference. DOE and tachycardic with little activity. On atrovent nebulizers 4 x a day, COPD, prednisone are all contributing to  tachycardia. With transient CHB in hospital I'm reluctant to push BB. Palliative care consult put in by Dr. Elsworth Soho.  Nonobstuctive CAD: -muscle aches on  zetia 10mg  daily-will stop and see if symptoms improve -Can refer to lipid clinic for consideration of PCSK9i if patient amenable at next visit but with palliative care not sure we need to push this issue     Chronic hypoxic respiratory failure/COPD: Followed by Dr. Elsworth Soho -Continue supplemental O2   HTN Elevated today but will not push meds -Continue losartan 50mg  daily  -Off spiro due to hyperK   LBBB/Transient CHB: Patient with chronic LBBB and transient episode of CHB following LHC in 06/2021. Required temporary pacing. LHC without obstructive disease. Off BB. -now back on low dose BB and Zio no bradycardia. Won't push further -No obstructive disease on cath    HLD Managed by PCP. Intolerant to crestor and simva and now thinks zetia is causing muscle aches-will stop    DMII -Continue metformin -No SGLT2i due to frequent UTI      Shared Decision Making/Informed Consent        Medication Adjustments/Labs and Tests Ordered: Current medicines are reviewed at length with the patient today.  Concerns regarding medicines are outlined above.  Medication changes, Labs and Tests ordered today are listed in the Patient Instructions below. Patient Instructions  Medication Instructions:  Your physician has recommended you make the following change in your medication:   DISCONTINUE: Ezetimibe (zetia)  *If you need a refill on your cardiac medications before your next appointment, please call your pharmacy*   Lab Work: None If you have labs (blood work) drawn today and your tests are completely normal, you will receive your results only by: [place] (if you have MyChart) OR A paper copy in the mail If you have any lab test that is abnormal or we need to change your treatment, we will call you to review the  results.   Follow-Up: At [facility], you and your health needs are our priority.  As part of our continuing mission to provide you with exceptional heart care, we have created designated Provider Care Teams.  These Care Teams include your primary Cardiologist (physician) and Advanced Practice Providers (APPs -  Physician Assistants and Nurse Practitioners) who all work together to provide you with the care you need, when you need it.   Your next appointment:   3 month(s)  The format for your next appointment:   In Person  Provider:   Freada Bergeron, MD     Other Instructions [facility] [phone]      Signed, Ermalinda Barrios, PA-C  01/09/2022 10:49 AM    [address], [place], [place]  [address] Phone: [phone]; Fax: [phone]

## 2022-01-03 DIAGNOSIS — M6281 Muscle weakness (generalized): Secondary | ICD-10-CM | POA: Diagnosis not present

## 2022-01-03 DIAGNOSIS — R2689 Other abnormalities of gait and mobility: Secondary | ICD-10-CM | POA: Diagnosis not present

## 2022-01-04 DIAGNOSIS — M6281 Muscle weakness (generalized): Secondary | ICD-10-CM | POA: Diagnosis not present

## 2022-01-04 IMAGING — CT CT HEAD W/O CM
4 series · 15 of 47 positions shown, 17 images · non-contrast
Comparison: No pertinent prior studies available for comparison.

CLINICAL DATA: Focal neuro deficit, greater than 6 hours, stroke
suspected. Additional provided: Altered mental status since
yesterday. Tremors.

EXAM:
CT HEAD WITHOUT CONTRAST
TECHNIQUE: Contiguous axial images were obtained from the base of the skull
through the vertex without intravenous contrast.

[Series 3: head without · axial · non-contrast · 0.42mm/px · z∈[-128,-8]mm · 7 of 34 slices shown, 9 images]
[im 5/34  brain]
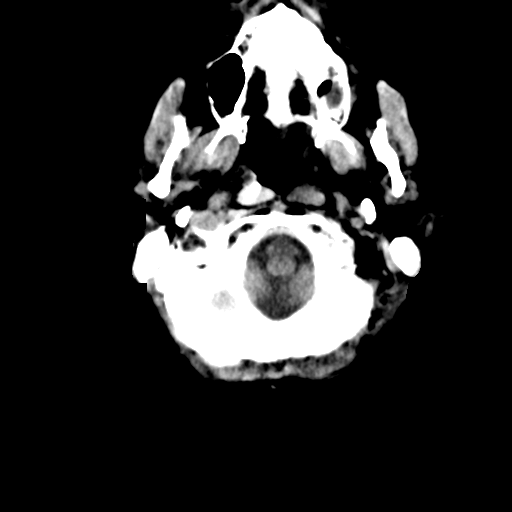
[im 5/34  bone]
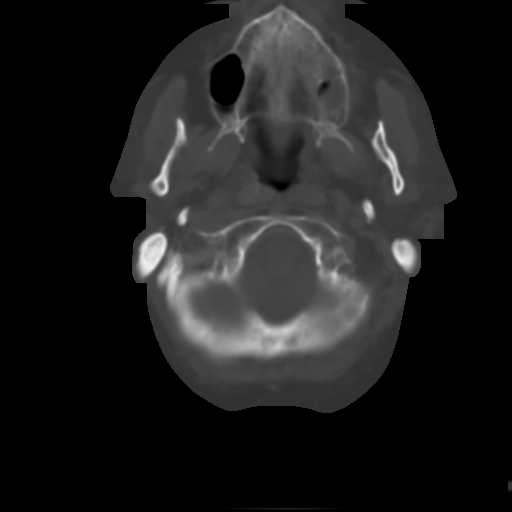
[im 9/34  brain]
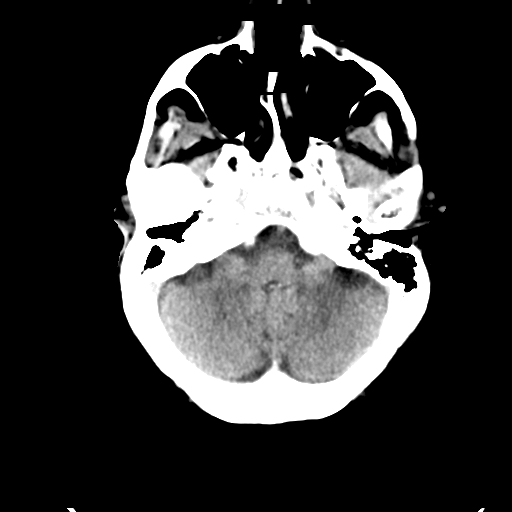
[im 13/34  brain]
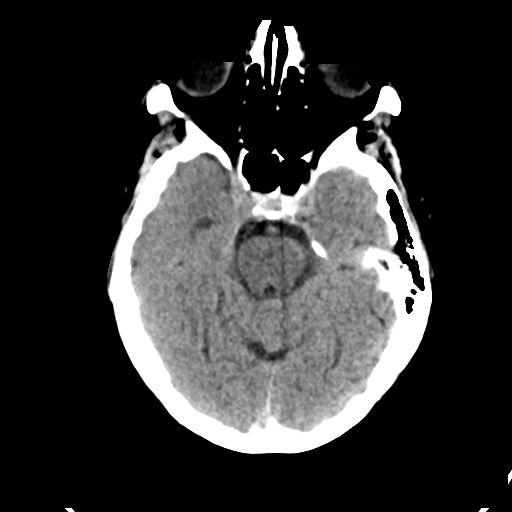
[im 17/34  brain]
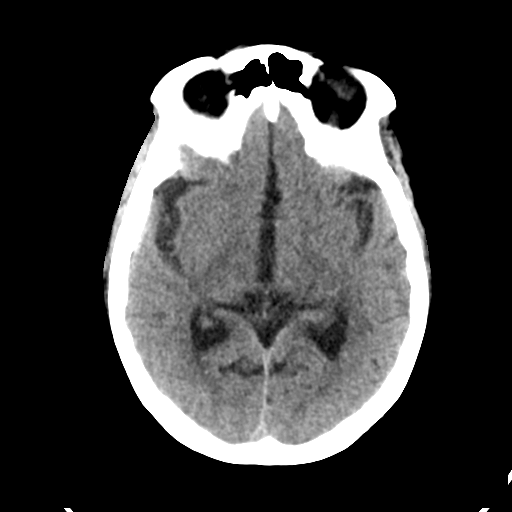
[im 21/34  brain]
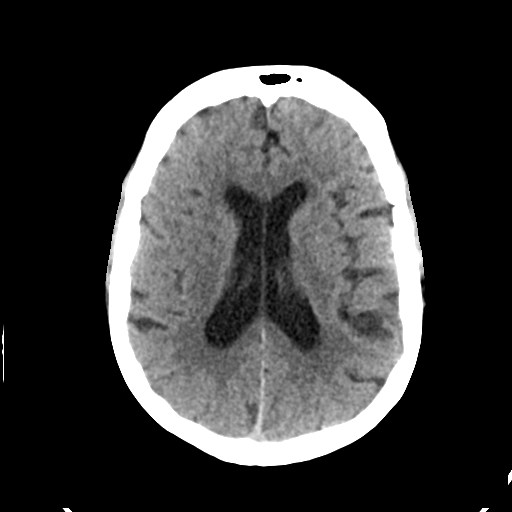
[im 21/34  bone]
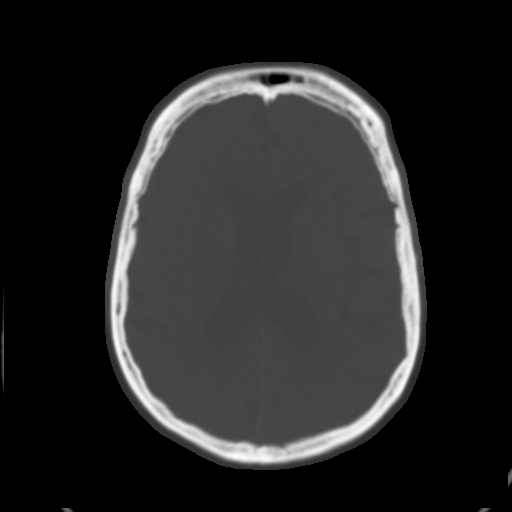
[im 25/34  brain]
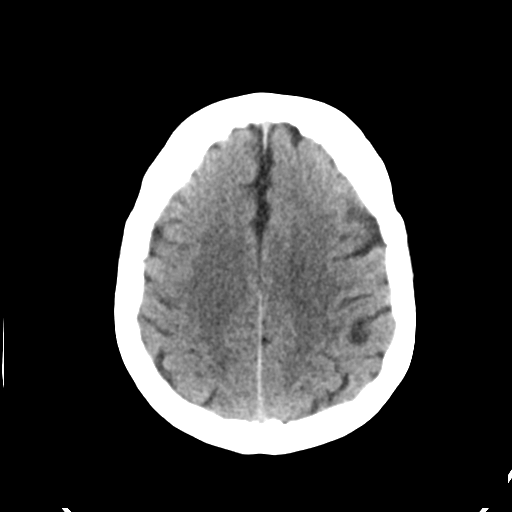
[im 29/34  brain]
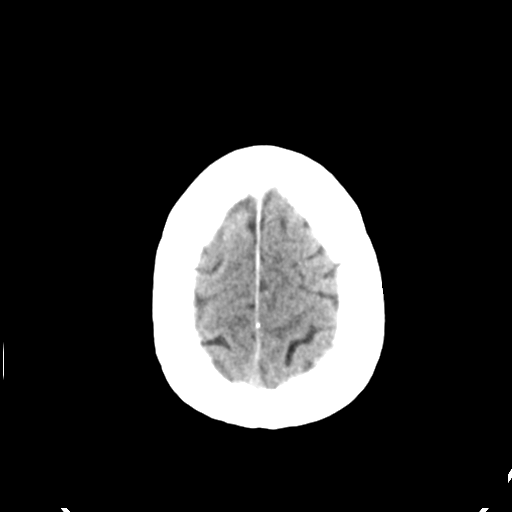

[Series 4: head bone · axial · 0.42mm/px · z∈[-132,-116]mm · 2 of 83 slices shown]
[im 9/83  bone]
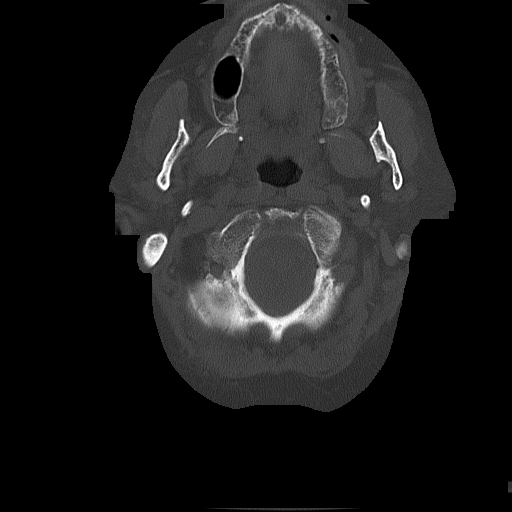
[im 17/83  bone]
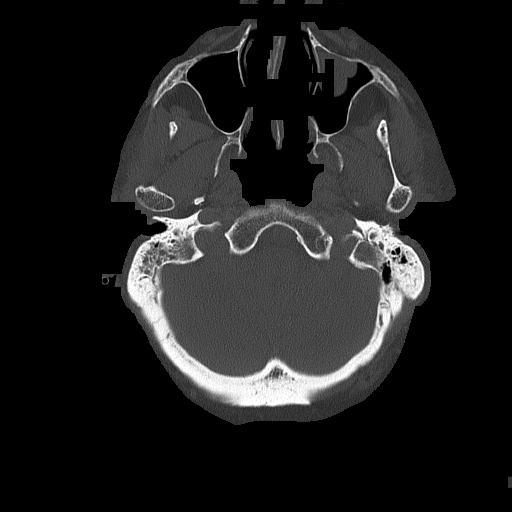

[Series 5: head without cor · coronal · non-contrast · 0.32mm/px · 3 of 67 slices shown]
[im 23/67  brain]
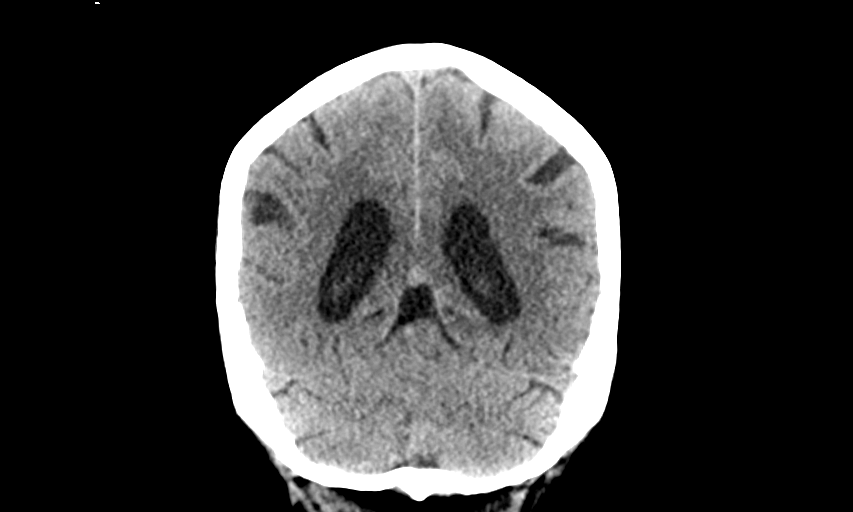
[im 30/67  brain]
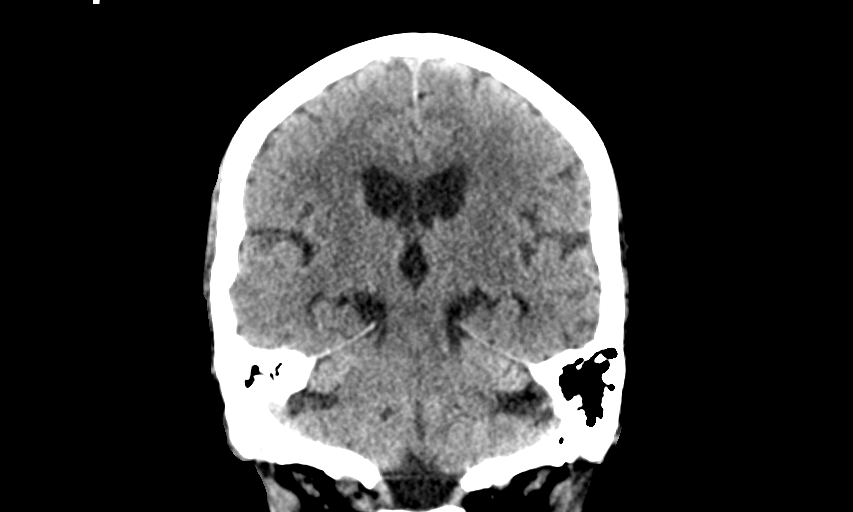
[im 37/67  brain]
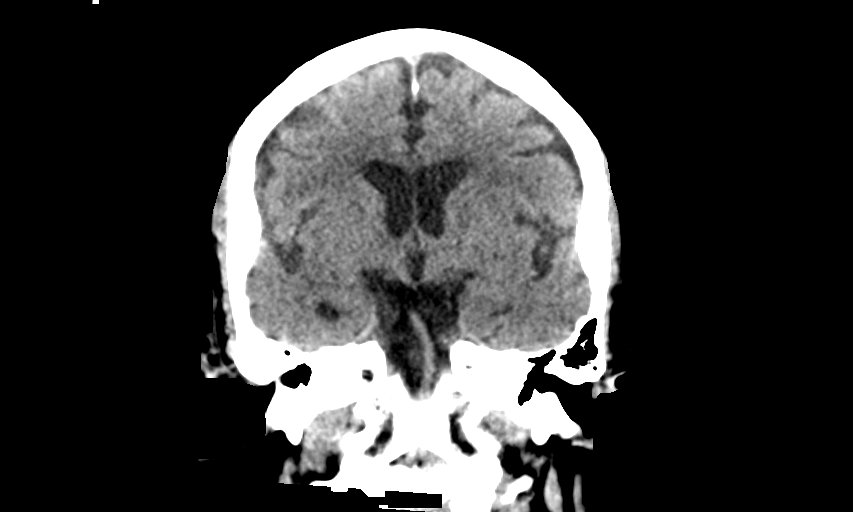

[Series 6: head without sag · sagittal · non-contrast · 0.32mm/px · 3 of 67 slices shown]
[im 23/67  brain]
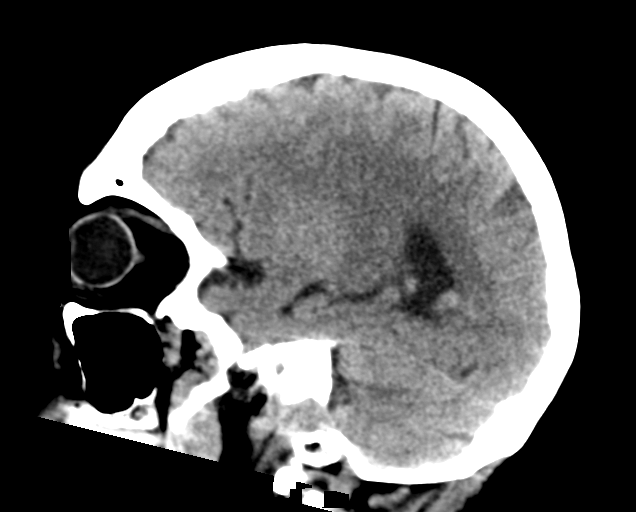
[im 34/67  brain]
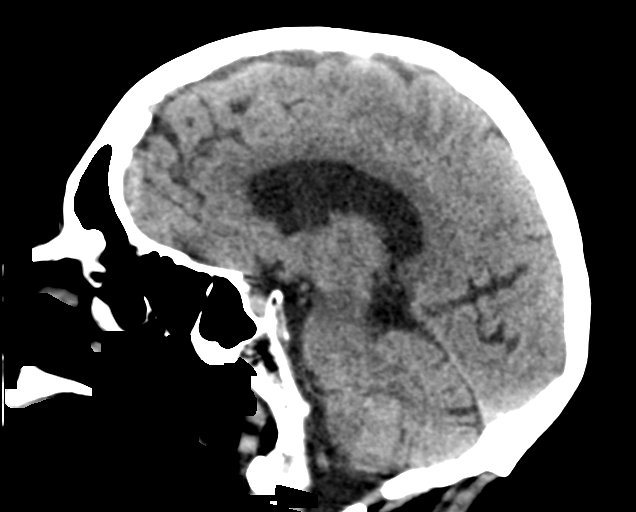
[im 45/67  brain]
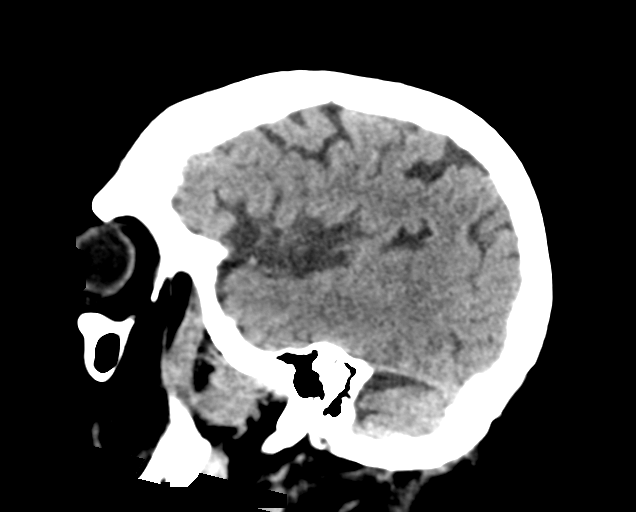

[15 of 47 positions shown; findings below may reference images not displayed]

FINDINGS: Brain:

There is mild generalized parenchymal atrophy.

Mild ill-defined hypoattenuation within the cerebral white matter is
nonspecific, but consistent with chronic small vessel ischemic
disease.

There is no acute intracranial hemorrhage.

No demarcated cortical infarct is identified.

No extra-axial fluid collection.

No evidence of intracranial mass.

No midline shift.

Vascular: No hyperdense vessel.  Atherosclerotic calcifications.

Skull: Normal. Negative for fracture or focal lesion.

Sinuses/Orbits: Visualized orbits show no acute finding.
Moderate-sized left maxillary sinus mucous retention cyst. Mild
ethmoid and right sphenoid sinus mucosal thickening. Right mastoid
effusion.
IMPRESSION: No CT evidence of acute intracranial abnormality.

Mild generalized parenchymal atrophy and chronic small vessel
ischemic disease.

Mild ethmoid and right sphenoid sinus mucosal thickening. Left
maxillary sinus mucous retention cyst.

Right mastoid effusion.

## 2022-01-06 DIAGNOSIS — J449 Chronic obstructive pulmonary disease, unspecified: Secondary | ICD-10-CM | POA: Diagnosis not present

## 2022-01-08 DIAGNOSIS — M6281 Muscle weakness (generalized): Secondary | ICD-10-CM | POA: Diagnosis not present

## 2022-01-09 ENCOUNTER — Ambulatory Visit: Payer: Medicare HMO | Admitting: Physician Assistant

## 2022-01-09 ENCOUNTER — Encounter: Payer: Self-pay | Admitting: Physician Assistant

## 2022-01-09 VITALS — BP 150/64 | HR 83 | Ht 62.0 in | Wt 148.8 lb

## 2022-01-09 DIAGNOSIS — E118 Type 2 diabetes mellitus with unspecified complications: Secondary | ICD-10-CM | POA: Diagnosis not present

## 2022-01-09 DIAGNOSIS — I10 Essential (primary) hypertension: Secondary | ICD-10-CM

## 2022-01-09 DIAGNOSIS — I5022 Chronic systolic (congestive) heart failure: Secondary | ICD-10-CM | POA: Diagnosis not present

## 2022-01-09 DIAGNOSIS — J4489 Other specified chronic obstructive pulmonary disease: Secondary | ICD-10-CM

## 2022-01-09 DIAGNOSIS — I2510 Atherosclerotic heart disease of native coronary artery without angina pectoris: Secondary | ICD-10-CM

## 2022-01-09 DIAGNOSIS — E785 Hyperlipidemia, unspecified: Secondary | ICD-10-CM

## 2022-01-09 DIAGNOSIS — I447 Left bundle-branch block, unspecified: Secondary | ICD-10-CM | POA: Diagnosis not present

## 2022-01-09 DIAGNOSIS — J449 Chronic obstructive pulmonary disease, unspecified: Secondary | ICD-10-CM | POA: Diagnosis not present

## 2022-01-09 DIAGNOSIS — R000 Tachycardia, unspecified: Secondary | ICD-10-CM

## 2022-01-09 DIAGNOSIS — I428 Other cardiomyopathies: Secondary | ICD-10-CM | POA: Diagnosis not present

## 2022-01-09 NOTE — Patient Instructions (Signed)
Medication Instructions:  Your physician has recommended you make the following change in your medication:   DISCONTINUE: Ezetimibe (zetia)  *If you need a refill on your cardiac medications before your next appointment, please call your pharmacy*   Lab Work: None If you have labs (blood work) drawn today and your tests are completely normal, you will receive your results only by: MyChart Message (if you have MyChart) OR A paper copy in the mail If you have any lab test that is abnormal or we need to change your treatment, we will call you to review the results.   Follow-Up: At [facility], you and your health needs are our priority.  As part of our continuing mission to provide you with exceptional heart care, we have created designated Provider Care Teams.  These Care Teams include your primary Cardiologist (physician) and Advanced Practice Providers (APPs -  Physician Assistants and Nurse Practitioners) who all work together to provide you with the care you need, when you need it.   Your next appointment:   3 month(s)  The format for your next appointment:   In Person  Provider:   Meriam Sprague, MD     Other Instructions [facility] [phone]

## 2022-01-11 DIAGNOSIS — M6281 Muscle weakness (generalized): Secondary | ICD-10-CM | POA: Diagnosis not present

## 2022-01-12 ENCOUNTER — Other Ambulatory Visit: Payer: Self-pay | Admitting: Physician Assistant

## 2022-01-14 DIAGNOSIS — M6281 Muscle weakness (generalized): Secondary | ICD-10-CM | POA: Diagnosis not present

## 2022-01-17 DIAGNOSIS — M6281 Muscle weakness (generalized): Secondary | ICD-10-CM | POA: Diagnosis not present

## 2022-01-21 DIAGNOSIS — M6281 Muscle weakness (generalized): Secondary | ICD-10-CM | POA: Diagnosis not present

## 2022-01-22 DIAGNOSIS — M6281 Muscle weakness (generalized): Secondary | ICD-10-CM | POA: Diagnosis not present

## 2022-01-24 DIAGNOSIS — M6281 Muscle weakness (generalized): Secondary | ICD-10-CM | POA: Diagnosis not present

## 2022-01-24 DIAGNOSIS — J449 Chronic obstructive pulmonary disease, unspecified: Secondary | ICD-10-CM | POA: Diagnosis not present

## 2022-01-24 DIAGNOSIS — J432 Centrilobular emphysema: Secondary | ICD-10-CM | POA: Diagnosis not present

## 2022-01-28 DIAGNOSIS — N200 Calculus of kidney: Secondary | ICD-10-CM | POA: Diagnosis not present

## 2022-01-28 DIAGNOSIS — N3020 Other chronic cystitis without hematuria: Secondary | ICD-10-CM | POA: Diagnosis not present

## 2022-01-28 DIAGNOSIS — N8110 Cystocele, unspecified: Secondary | ICD-10-CM | POA: Diagnosis not present

## 2022-01-28 DIAGNOSIS — R3914 Feeling of incomplete bladder emptying: Secondary | ICD-10-CM | POA: Diagnosis not present

## 2022-01-28 DIAGNOSIS — N281 Cyst of kidney, acquired: Secondary | ICD-10-CM | POA: Diagnosis not present

## 2022-01-29 DIAGNOSIS — M6281 Muscle weakness (generalized): Secondary | ICD-10-CM | POA: Diagnosis not present

## 2022-01-30 DIAGNOSIS — R519 Headache, unspecified: Secondary | ICD-10-CM | POA: Diagnosis not present

## 2022-01-30 DIAGNOSIS — R448 Other symptoms and signs involving general sensations and perceptions: Secondary | ICD-10-CM | POA: Diagnosis not present

## 2022-01-31 DIAGNOSIS — M6281 Muscle weakness (generalized): Secondary | ICD-10-CM | POA: Diagnosis not present

## 2022-02-01 DIAGNOSIS — M6281 Muscle weakness (generalized): Secondary | ICD-10-CM | POA: Diagnosis not present

## 2022-02-04 DIAGNOSIS — M6281 Muscle weakness (generalized): Secondary | ICD-10-CM | POA: Diagnosis not present

## 2022-02-05 DIAGNOSIS — J449 Chronic obstructive pulmonary disease, unspecified: Secondary | ICD-10-CM | POA: Diagnosis not present

## 2022-02-06 DIAGNOSIS — M6281 Muscle weakness (generalized): Secondary | ICD-10-CM | POA: Diagnosis not present

## 2022-02-07 DIAGNOSIS — M6281 Muscle weakness (generalized): Secondary | ICD-10-CM | POA: Diagnosis not present

## 2022-02-11 DIAGNOSIS — M6281 Muscle weakness (generalized): Secondary | ICD-10-CM | POA: Diagnosis not present

## 2022-02-12 ENCOUNTER — Ambulatory Visit: Payer: Medicare HMO | Admitting: Pulmonary Disease

## 2022-02-12 ENCOUNTER — Encounter: Payer: Self-pay | Admitting: Pulmonary Disease

## 2022-02-12 VITALS — BP 148/88 | HR 88 | Ht 62.0 in | Wt 153.6 lb

## 2022-02-12 DIAGNOSIS — J9612 Chronic respiratory failure with hypercapnia: Secondary | ICD-10-CM

## 2022-02-12 DIAGNOSIS — J9611 Chronic respiratory failure with hypoxia: Secondary | ICD-10-CM | POA: Diagnosis not present

## 2022-02-12 DIAGNOSIS — Z515 Encounter for palliative care: Secondary | ICD-10-CM | POA: Diagnosis not present

## 2022-02-12 DIAGNOSIS — J441 Chronic obstructive pulmonary disease with (acute) exacerbation: Secondary | ICD-10-CM

## 2022-02-12 NOTE — Assessment & Plan Note (Signed)
Continue oxygen 24/7. Obtain nocturnal oximetry on oxygen and hopefully this will qualify her for BiPAP.  Prescription for BiPAP is already been sent to [facility]. Unfortunately she did not qualify for NIV for some reason.

## 2022-02-12 NOTE — Assessment & Plan Note (Signed)
We did an extended course of prednisone but not sure this is helping anymore. I have asked her to taper prednisone gradually to off, 10 mg every other day for 2 weeks and then stop. She will continue on a combination of budesonide, Brovana and Atrovent nebs

## 2022-02-12 NOTE — Patient Instructions (Signed)
x we will find out from [facility] what is holding up your BiPAP/NIV delivery  X we discussed outpatient palliative care referral

## 2022-02-12 NOTE — Progress Notes (Signed)
   Subjective:    Patient ID: Rebecca [name], female    DOB: Sep 20, 1940, 81 y.o.   MRN: [id number]  HPI  81 yo ex-smoker for follow-up of COPD and chronic respiratory failure on oxygen since 2010   She smoked about a pack per day until she quit in 2003, more than 40 pack years PMH -nonischemic cardiomyopathy, last EF 46% 07/2020 ,diabetes and hypertension Lives in independent living at [facility] 6/ 2021 for COPD exacerbation and hypercarbic hypoxic respiratory failure required BiPAP support.   admitted 06/2021 for hemoptysis and acute on chronic respiratory failure.  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  Patient was felt to have possible aspiration pneumonia.  CT chest was negative for PE.  It showed bullous emphysema and bibasilar airspace opacities .She was treated with empiric antibiotics   She completed longer course of 5 mg of prednisone  09/2021 Rx NIV for chronic hypercarbic respiratory failure Received denial from [facility] for NIV 'we do not have evidence of failure of a standard biPAP in AVAPS mode "   Sent request to DME for BiPAP - auto AVAPS mode, unfortunately she has not received it yet .  We contacted [facility] but previously told us that they had all the paperwork they needed including the order but now they are requesting nocturnal oximetry on oxygen before providing her with BiPAP. At her last visit 12/2021 , we provided her with an extended course of prednisone which she states that breathing is still bad and complains of persistent dyspnea. She is maintained on 10 mg of prednisone, budesonide, Brovana combination with Atrovent nebs. I also gave her low-dose Xanax which she has used as needed but this has not helped much. She ambulates with a cane. Arrives on oxygen, accompanied by her daughter   Significant tests/ events reviewed  ABG  09/2021 7.3 5/81/40 5/78% on 4 L nasal cannula,, corresponding saturation was 95% Spirometry 01/2018 showed severe airway obstruction with ratio 43, FEV1 of 0.57-31% and FVC of 53%.   CT angio 06/2018 LUL nodule 43mm   CT chest angio 12/2019 >> severe emphysema   Review of Systems neg for any significant sore throat, dysphagia, itching, sneezing, nasal congestion or excess/ purulent secretions, fever, chills, sweats, unintended wt loss, pleuritic or exertional cp, hempoptysis, orthopnea pnd or change in chronic leg swelling. Also denies presyncope, palpitations, heartburn, abdominal pain, nausea, vomiting, diarrhea or change in bowel or urinary habits, dysuria,hematuria, rash, arthralgias, visual complaints, headache, numbness weakness or ataxia.     Objective:   Physical Exam  Gen. Pleasant, obese, in no distress, anxious affect ENT - no lesions, no post nasal drip, on O2 [place]  Neck: No JVD, no thyromegaly, no carotid bruits Lungs: no use of accessory muscles, no dullness to percussion, decreased without rales or rhonchi  Cardiovascular: Rhythm regular, heart sounds  normal, no murmurs or gallops, no peripheral edema Musculoskeletal: No deformities, no cyanosis or clubbing , no tremors       Assessment & Plan:

## 2022-02-12 NOTE — Assessment & Plan Note (Signed)
Xanax did not help her much and we will discontinue.  I will refer her to outpatient palliative care. I brought up use of morphine in the setting for relief of dyspnea.  She is not mentally ready to start that yet

## 2022-02-13 DIAGNOSIS — M6281 Muscle weakness (generalized): Secondary | ICD-10-CM | POA: Diagnosis not present

## 2022-02-14 ENCOUNTER — Telehealth: Payer: Medicare HMO | Admitting: Family Medicine

## 2022-02-14 ENCOUNTER — Telehealth: Payer: Self-pay

## 2022-02-14 ENCOUNTER — Encounter: Payer: Self-pay | Admitting: Family Medicine

## 2022-02-14 DIAGNOSIS — J9611 Chronic respiratory failure with hypoxia: Secondary | ICD-10-CM | POA: Diagnosis not present

## 2022-02-14 DIAGNOSIS — Z515 Encounter for palliative care: Secondary | ICD-10-CM | POA: Diagnosis not present

## 2022-02-14 DIAGNOSIS — J9612 Chronic respiratory failure with hypercapnia: Secondary | ICD-10-CM | POA: Diagnosis not present

## 2022-02-14 NOTE — Progress Notes (Signed)
Therapist, nutritional Palliative Care Consult Note Telephone: [phone]  Fax: [phone]   Date of encounter: 02/14/22 11:42 AM PATIENT NAME: Rebecca [name] [address] [place] [address]   [phone] (home)  DOB: Oct 30, 1940 MRN: [id number] PRIMARY CARE PROVIDER:    Farris Has, MD,  [address] [place] [address] [phone]  REFERRING PROVIDER:   Farris Has, MD [address] [place],  [place] [address] [phone]  RESPONSIBLE PARTY:    Contact Information     Name Relation Home Work Mobile   Swing,Rebecca [name] [phone]  [phone]   [name],Rebecca [name] [phone]  [phone]       I connected with  Rebecca [name] on 02/14/22 by a video enabled telemedicine application and verified that I am speaking with the correct person using two identifiers.   I discussed the limitations of evaluation and management by telemedicine. The patient expressed understanding and agreed to proceed.  Palliative Care was asked to follow this patient by consultation request of  Farris Has, MD to address advance care planning and complex medical decision making. This is the initial visit.          ASSESSMENT, SYMPTOM MANAGEMENT AND PLAN / RECOMMENDATIONS:   Palliative Care Encounter Follow up at face to face with goals of care  Chronic respiratory failure secondary to COPD/? CHF with reduced EF Encourage compliance with O2 flow to maintain normal breathing Pt aware of s/sx of heart failure to monitor for 2-3 lbs weight gain/24 hours vs 5 lbs in 1 week. Educated that both CHF and COPD could be contributing via different mechanisms to chronic respiratory failure  Follow up Palliative Care Visit: Palliative care will continue to follow for complex medical decision making, advance care planning, and clarification of goals. Return 3-4 weeks or prn.    This visit was coded  based on medical decision making (MDM).  PPS: 70%  HOSPICE ELIGIBILITY/DIAGNOSIS: TBD  Chief Complaint:  Civil engineer, contracting Palliative Care received a referral to follow up with patient for chronic disease management of chronic respiratory failure and heart failure with COPD.  Palliative Care is following for advance directive planning and defining/refining goals of care.  HISTORY OF PRESENT ILLNESS:  Rebecca [name] is a 81 y.o. year old female with chronic respiratory failure with hypoxia and hypercapnia with COPD, non-ischemic cardiomyopathy, CHF with reduced EF, HTN, CAD,  HLD, and protein calorie malnutrition. Pt has just come to [facility] from [facility].  She reports that sometimes she is SOB at rest. She has been told to keep her concentrator on at < 6 L so she keeps it on 5 at rest and 5.5 L with exertion, otherwise she is on portable O2 at 3L/min at rest and 4L with exertion. Cannot lay flat.  No increase in orthopnea, no PND. No worsening DOE. Can't stand for very long at a time.  Denies increased SOB, seldom coughs and is usually non-productive.  Feels like feet are swollen.  Weighs daily.   Has some constipation, uses Metamucil gummi bears, stool softeners, eating green vegetables and drinking smooth move tea.  No falls, appetite is good.  Gets down "when I have spells where I can't catch my breath".  Has HC POA is one of her daughters, thinks it is Data processing manager. She takes her to medical appointments.  Pt did not have her med list with her and was anxious to conclude interview to go  to lunch. Will follow up on medications more thoroughly at follow up visit.  History obtained from review of EMR, discussion with Rebecca [name].   Notes from Dr Vassie Loll Pulmonologist on 02/12/22 office visit notes that he has recommended BI-pap with auto-AVAPS Mode through [facility] who is currently requesting nocturnal oximetry on O2 before providing BiPap.  She was given an extended Prednisone course  for breathing and Xanax for prn anxiety from dyspnea relief with no significant improvement. He discussed use of Morphine for relief of dyspnea with her but she was not ready to take that step.  He has instructed her on a Prednisone wean until she is off since this has not appreciably helped her breathing. Spirometry from 2019 showed severe airway obstruction with FEV 1 of 0.57-31% predicted and FVC 53%.  He advised continuing her combination of Budesonide, Brovana and Atrovent nebs.  Echo 08/18/20:  EF 40-45%, The anterior septum, mid inferoseptal segment, and basal inferoseptal segment are hypokinetic. No significant valvular disease.    I reviewed available labs, medications, imaging, studies and related documents from the EMR.  Records reviewed and summarized above.   ROS General: NAD Cardiovascular: denies chest pain, endorses DOE Pulmonary: denies cough, endorses baseline increased SOB, has orthopnea Abdomen: endorses good appetite, endorses constipation, endorses continence of bowel GU: denies dysuria, endorses continence of urine MSK:  denies increased weakness, no falls reported Neurological: denies pain, denies insomnia Psych: Endorses stable mood   Physical Exam: Current and past weights: Current weight 149 lbs on home scale, was 153 lbs 9.6 ounces on 02/12/22 at Dr Reginia Naas office.  Constitutional: NAD General: frail ENMT: intact hearing CV: No visible cyanosis, slight pallor.  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) 06/18/2021   CAD (coronary artery disease) 06/18/2021   Essential hypertension 06/18/2021   Hyperlipidemia 06/18/2021   Left bundle branch block 06/18/2021   Dyspnea on minimal exertion 06/14/2021   Heart block AV complete (HCC) 06/14/2021   Physical deconditioning 08/03/2020   Chronic obstructive pulmonary disease with acute exacerbation (HCC)    Palliative care encounter    Goals of care, counseling/discussion    COPD exacerbation (HCC) 01/12/2020   Nonischemic cardiomyopathy (HCC) 07/02/2018   Lung nodule 06/18/2018   Chronic respiratory failure with hypoxia and hypercapnia (HCC) 02/05/2018   COPD with chronic bronchitis and emphysema (HCC) 02/05/2018   PAST MEDICAL HISTORY:  Active Ambulatory Problems    Diagnosis Date Noted   Chronic respiratory failure with hypoxia and hypercapnia (HCC) 02/05/2018   COPD with chronic bronchitis and emphysema (HCC) 02/05/2018   Lung nodule 06/18/2018   Nonischemic cardiomyopathy (HCC) 07/02/2018   COPD exacerbation (HCC) 01/12/2020   Chronic obstructive pulmonary disease with acute exacerbation (HCC)    Palliative care encounter    Goals of care, counseling/discussion    Physical deconditioning 08/03/2020   Dyspnea on minimal exertion 06/14/2021   Heart block AV complete (HCC) 06/14/2021   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) 06/18/2021   CAD (coronary  artery disease) 06/18/2021   Essential hypertension 06/18/2021   Hyperlipidemia 06/18/2021   Left bundle branch block 06/18/2021   Hemoptysis 06/19/2021   Hematochezia 06/19/2021   HCAP (healthcare-associated pneumonia)    Macrocytic anemia 06/28/2021   Unspecified protein-calorie malnutrition (HCC) 06/28/2021   Aspiration pneumonia (HCC) 07/03/2021   Resolved Ambulatory Problems    Diagnosis Date Noted   Acute respiratory failure with hypoxia (HCC) 01/13/2020   Past Medical History:   Diagnosis Date   Congestive heart disease (HCC)    COPD (chronic obstructive pulmonary disease) (HCC)    Diabetes (HCC) with atherosclerosis    Hyperlipemia    Osteoporosis    Primary hypertension    SOCIAL HX:  Social History   Tobacco Use   Smoking status: Former    Packs/day: 1.50    Years: 40.00    Total pack years: 60.00    Types: Cigarettes    Quit date: 2003    Years since quitting: 20.6   Smokeless tobacco: Never  Substance Use Topics   Alcohol use: Yes    Alcohol/week: 1.0 standard drink of alcohol    Types: 1 Glasses of wine per week   FAMILY HX: No family history on file.     Preferred Pharmacy: ALLERGIES:  Allergies  Allergen Reactions   Gabapentin Other (See Comments)    Affects breathing   Pravastatin Sodium Other (See Comments)    Muscle aches    Rosuvastatin Calcium     Muscle Aches    Simvastatin     Muscle aches  Other reaction(s): Muscle pain   Amoxicillin Rash   Lisinopril Rash    Other reaction(s): rash   Sulfacetamide Rash   Tetracyclines & Related Rash     PERTINENT MEDICATIONS:  Outpatient Encounter Medications as of 02/14/2022  Medication Sig   acetaminophen (TYLENOL) 500 MG tablet Take 1,000 mg by mouth 2 (two) times daily as needed for mild pain or headache.   albuterol (PROVENTIL) (2.5 MG/3ML) 0.083% nebulizer solution INHALE 1 VIAL BY NEBULIZATION EVERY DAY IN THE MORNING, AT NOON, AND AT BEDTIME. USE AS DIRECTED.   albuterol (VENTOLIN HFA) 108 (90 Base) MCG/ACT inhaler Inhale 2 puffs into the lungs every 6 (six) hours as needed for wheezing or shortness of breath.   ALPRAZolam (XANAX) 0.5 MG tablet Take 0.5 tablets (0.25 mg total) by mouth 2 (two) times daily as needed for anxiety.   arformoterol (BROVANA) 15 MCG/2ML NEBU USE 1 VIAL  IN  NEBULIZER TWICE  DAILY - morning and evening   aspirin EC 81 MG tablet Take 81 mg by mouth daily. Swallow whole.   bisacodyl (DULCOLAX) 10 MG suppository If not relieved by MOM, give 10 mg  Bisacodyl suppositiory rectally X 1 dose in 24 hours as needed   brimonidine (ALPHAGAN) 0.2 % ophthalmic solution Place 1 drop into the left eye 2 (two) times daily.   budesonide (PULMICORT) 0.5 MG/2ML nebulizer solution USE 1 VIAL  IN  NEBULIZER TWICE  DAILY - Rinse mouth after treatment   Calcium Carbonate-Vitamin D 600-200 MG-UNIT TABS Take 1 tablet by mouth every evening.   carboxymethylcellulose (REFRESH PLUS) 0.5 % SOLN Place 1 drop into both eyes 3 (three) times daily as needed (dry eyes).   Cranberry-Vitamin C-Probiotic (AZO CRANBERRY) 250-30 MG TABS Take by mouth.   docusate sodium (COLACE) 100 MG capsule Take 100 mg by mouth daily.   fluticasone (FLONASE) 50 MCG/ACT nasal spray 2 sprays as needed.   ipratropium (ATROVENT) 0.02 % nebulizer solution Take  1.25 mLs (0.25 mg total) by nebulization in the morning, at noon, in the evening, and at bedtime. USE 1 VIAL IN NEBULIZER 3 TIMES DAILY   losartan (COZAAR) 50 MG tablet Take 1 tablet (50 mg total) by mouth daily.   magnesium hydroxide (MILK OF MAGNESIA) 400 MG/5ML suspension If no BM in 3 days, give 30 cc Milk of Magnesium p.o. x 1 dose in 24 hours as needed   metFORMIN (GLUCOPHAGE) 500 MG tablet Take 1 tablet (500 mg total) by mouth daily with supper.   metoprolol tartrate (LOPRESSOR) 25 MG tablet TAKE 1/2 TABLET TWICE DAILY   Multiple Vitamins-Minerals (EYE VITAMINS PO) Take 1 tablet by mouth daily.   NON FORMULARY Diet:HH chopped/thin diet consistency   Polyethylene Glycol 3350 POWD by Does not apply route. TAKE 17GM IN 4-80z OF FLUID OF CHOICE ONCE DAILY FOR CONSTIPATION   potassium chloride (KLOR-CON M) 10 MEQ tablet Take 1 tablet (10 mEq total) by mouth 2 (two) times daily.   predniSONE (DELTASONE) 10 MG tablet Take 2 tablets (20 mg total) by mouth daily with breakfast for 5 days, THEN 1 tablet (10 mg total) daily with breakfast.   senna (SENOKOT) 8.6 MG TABS tablet Take 2 tablets by mouth 2 (two) times daily.   Sodium Phosphates (RA  SALINE ENEMA RE) If not relieved by Biscodyl suppository, give disposable Saline Enema rectally X 1 dose/24 hrs as needed. Contact MD as needed if no results from enema   torsemide (DEMADEX) 20 MG tablet TAKE 1 TABLET EVERY DAY   No facility-administered encounter medications on file as of 02/14/2022.     Prior code status listed as full code. Follow up goals of care at face to face visit.   Thank you for the opportunity to participate in the care of Ms. Buras.  The palliative care team will continue to follow. Please call our office at [phone] if we can be of additional assistance.   Lurline Idol, FNP-C  COVID-19 PATIENT SCREENING TOOL Asked and negative response unless otherwise noted:  Have you had symptoms of covid, tested positive or been in contact with someone with symptoms/positive test in the past 5-10 days?  no

## 2022-02-14 NOTE — Telephone Encounter (Signed)
Spoke with patient and scheduled a telephonic Palliative Consult for 02/14/22 @ 11:30 AM.  Consent obtained; updated Netsmart, Team List and Epic.

## 2022-02-15 DIAGNOSIS — M6281 Muscle weakness (generalized): Secondary | ICD-10-CM | POA: Diagnosis not present

## 2022-02-19 ENCOUNTER — Encounter: Payer: Self-pay | Admitting: Pulmonary Disease

## 2022-02-19 DIAGNOSIS — M6281 Muscle weakness (generalized): Secondary | ICD-10-CM | POA: Diagnosis not present

## 2022-02-19 DIAGNOSIS — R0683 Snoring: Secondary | ICD-10-CM | POA: Diagnosis not present

## 2022-02-19 DIAGNOSIS — G4730 Sleep apnea, unspecified: Secondary | ICD-10-CM | POA: Diagnosis not present

## 2022-02-21 DIAGNOSIS — M6281 Muscle weakness (generalized): Secondary | ICD-10-CM | POA: Diagnosis not present

## 2022-02-22 DIAGNOSIS — M6281 Muscle weakness (generalized): Secondary | ICD-10-CM | POA: Diagnosis not present

## 2022-02-25 DIAGNOSIS — M6281 Muscle weakness (generalized): Secondary | ICD-10-CM | POA: Diagnosis not present

## 2022-02-27 DIAGNOSIS — M6281 Muscle weakness (generalized): Secondary | ICD-10-CM | POA: Diagnosis not present

## 2022-02-27 DIAGNOSIS — J449 Chronic obstructive pulmonary disease, unspecified: Secondary | ICD-10-CM | POA: Diagnosis not present

## 2022-02-28 DIAGNOSIS — H93299 Other abnormal auditory perceptions, unspecified ear: Secondary | ICD-10-CM | POA: Diagnosis not present

## 2022-02-28 DIAGNOSIS — R448 Other symptoms and signs involving general sensations and perceptions: Secondary | ICD-10-CM | POA: Diagnosis not present

## 2022-02-28 DIAGNOSIS — J341 Cyst and mucocele of nose and nasal sinus: Secondary | ICD-10-CM | POA: Diagnosis not present

## 2022-02-28 DIAGNOSIS — H6123 Impacted cerumen, bilateral: Secondary | ICD-10-CM | POA: Diagnosis not present

## 2022-02-28 DIAGNOSIS — Z789 Other specified health status: Secondary | ICD-10-CM | POA: Diagnosis not present

## 2022-03-01 ENCOUNTER — Encounter: Payer: Self-pay | Admitting: Pulmonary Disease

## 2022-03-01 DIAGNOSIS — J9611 Chronic respiratory failure with hypoxia: Secondary | ICD-10-CM

## 2022-03-04 DIAGNOSIS — H903 Sensorineural hearing loss, bilateral: Secondary | ICD-10-CM | POA: Diagnosis not present

## 2022-03-05 NOTE — Telephone Encounter (Signed)
Per pt's chart the ONO order was sent to [facility]. Will forward back to triage to look for ONO. If not found we can call [facility] to refax to [place].

## 2022-03-07 ENCOUNTER — Encounter: Payer: Self-pay | Admitting: Pulmonary Disease

## 2022-03-08 DIAGNOSIS — J449 Chronic obstructive pulmonary disease, unspecified: Secondary | ICD-10-CM | POA: Diagnosis not present

## 2022-03-08 DIAGNOSIS — M6281 Muscle weakness (generalized): Secondary | ICD-10-CM | POA: Diagnosis not present

## 2022-03-11 DIAGNOSIS — M6281 Muscle weakness (generalized): Secondary | ICD-10-CM | POA: Diagnosis not present

## 2022-03-13 DIAGNOSIS — M6281 Muscle weakness (generalized): Secondary | ICD-10-CM | POA: Diagnosis not present

## 2022-03-14 NOTE — Telephone Encounter (Signed)
ONO on O2 did not show any desaturations

## 2022-03-19 NOTE — Telephone Encounter (Signed)
Dr. Vassie Loll, pt is questioning where she stands with getting a bipap since her ONO was with no desats. Thanks.

## 2022-03-20 ENCOUNTER — Inpatient Hospital Stay (HOSPITAL_BASED_OUTPATIENT_CLINIC_OR_DEPARTMENT_OTHER)
Admission: EM | Admit: 2022-03-20 | Discharge: 2022-03-27 | DRG: 189 | Disposition: A | Discharge status: Skilled Nursing Facility | Payer: Medicare HMO | Attending: Internal Medicine | Admitting: Internal Medicine

## 2022-03-20 ENCOUNTER — Encounter (HOSPITAL_BASED_OUTPATIENT_CLINIC_OR_DEPARTMENT_OTHER): Payer: Self-pay | Admitting: Emergency Medicine

## 2022-03-20 ENCOUNTER — Emergency Department (HOSPITAL_BASED_OUTPATIENT_CLINIC_OR_DEPARTMENT_OTHER): Payer: Medicare HMO | Admitting: Radiology

## 2022-03-20 ENCOUNTER — Other Ambulatory Visit: Payer: Self-pay

## 2022-03-20 ENCOUNTER — Encounter (HOSPITAL_COMMUNITY): Payer: Self-pay

## 2022-03-20 DIAGNOSIS — Z882 Allergy status to sulfonamides status: Secondary | ICD-10-CM

## 2022-03-20 DIAGNOSIS — J449 Chronic obstructive pulmonary disease, unspecified: Secondary | ICD-10-CM | POA: Diagnosis not present

## 2022-03-20 DIAGNOSIS — Z881 Allergy status to other antibiotic agents status: Secondary | ICD-10-CM

## 2022-03-20 DIAGNOSIS — J441 Chronic obstructive pulmonary disease with (acute) exacerbation: Secondary | ICD-10-CM

## 2022-03-20 DIAGNOSIS — Z602 Problems related to living alone: Secondary | ICD-10-CM | POA: Diagnosis present

## 2022-03-20 DIAGNOSIS — Z7984 Long term (current) use of oral hypoglycemic drugs: Secondary | ICD-10-CM

## 2022-03-20 DIAGNOSIS — Z79899 Other long term (current) drug therapy: Secondary | ICD-10-CM | POA: Diagnosis not present

## 2022-03-20 DIAGNOSIS — Z880 Allergy status to penicillin: Secondary | ICD-10-CM

## 2022-03-20 DIAGNOSIS — I428 Other cardiomyopathies: Secondary | ICD-10-CM | POA: Diagnosis not present

## 2022-03-20 DIAGNOSIS — Z9981 Dependence on supplemental oxygen: Secondary | ICD-10-CM | POA: Diagnosis not present

## 2022-03-20 DIAGNOSIS — I110 Hypertensive heart disease with heart failure: Secondary | ICD-10-CM | POA: Diagnosis present

## 2022-03-20 DIAGNOSIS — I5042 Chronic combined systolic (congestive) and diastolic (congestive) heart failure: Secondary | ICD-10-CM | POA: Diagnosis not present

## 2022-03-20 DIAGNOSIS — Z87891 Personal history of nicotine dependence: Secondary | ICD-10-CM | POA: Diagnosis not present

## 2022-03-20 DIAGNOSIS — Z789 Other specified health status: Secondary | ICD-10-CM | POA: Diagnosis not present

## 2022-03-20 DIAGNOSIS — F419 Anxiety disorder, unspecified: Secondary | ICD-10-CM | POA: Diagnosis present

## 2022-03-20 DIAGNOSIS — E118 Type 2 diabetes mellitus with unspecified complications: Secondary | ICD-10-CM

## 2022-03-20 DIAGNOSIS — R0602 Shortness of breath: Secondary | ICD-10-CM | POA: Diagnosis not present

## 2022-03-20 DIAGNOSIS — D638 Anemia in other chronic diseases classified elsewhere: Secondary | ICD-10-CM | POA: Diagnosis not present

## 2022-03-20 DIAGNOSIS — M810 Age-related osteoporosis without current pathological fracture: Secondary | ICD-10-CM | POA: Diagnosis present

## 2022-03-20 DIAGNOSIS — R0902 Hypoxemia: Secondary | ICD-10-CM | POA: Diagnosis not present

## 2022-03-20 DIAGNOSIS — J9621 Acute and chronic respiratory failure with hypoxia: Principal | ICD-10-CM | POA: Diagnosis present

## 2022-03-20 DIAGNOSIS — R1312 Dysphagia, oropharyngeal phase: Secondary | ICD-10-CM | POA: Diagnosis not present

## 2022-03-20 DIAGNOSIS — R41841 Cognitive communication deficit: Secondary | ICD-10-CM | POA: Diagnosis not present

## 2022-03-20 DIAGNOSIS — Z7951 Long term (current) use of inhaled steroids: Secondary | ICD-10-CM

## 2022-03-20 DIAGNOSIS — J9622 Acute and chronic respiratory failure with hypercapnia: Secondary | ICD-10-CM | POA: Diagnosis present

## 2022-03-20 DIAGNOSIS — E1159 Type 2 diabetes mellitus with other circulatory complications: Secondary | ICD-10-CM | POA: Diagnosis not present

## 2022-03-20 DIAGNOSIS — Z888 Allergy status to other drugs, medicaments and biological substances status: Secondary | ICD-10-CM

## 2022-03-20 DIAGNOSIS — Z20822 Contact with and (suspected) exposure to covid-19: Secondary | ICD-10-CM | POA: Diagnosis present

## 2022-03-20 DIAGNOSIS — R54 Age-related physical debility: Secondary | ICD-10-CM | POA: Diagnosis present

## 2022-03-20 DIAGNOSIS — Z7952 Long term (current) use of systemic steroids: Secondary | ICD-10-CM

## 2022-03-20 DIAGNOSIS — E876 Hypokalemia: Secondary | ICD-10-CM | POA: Diagnosis not present

## 2022-03-20 DIAGNOSIS — M6281 Muscle weakness (generalized): Secondary | ICD-10-CM | POA: Diagnosis not present

## 2022-03-20 DIAGNOSIS — R2681 Unsteadiness on feet: Secondary | ICD-10-CM | POA: Diagnosis not present

## 2022-03-20 DIAGNOSIS — Z23 Encounter for immunization: Secondary | ICD-10-CM | POA: Diagnosis not present

## 2022-03-20 DIAGNOSIS — E785 Hyperlipidemia, unspecified: Secondary | ICD-10-CM | POA: Diagnosis present

## 2022-03-20 DIAGNOSIS — J9602 Acute respiratory failure with hypercapnia: Principal | ICD-10-CM

## 2022-03-20 DIAGNOSIS — I2510 Atherosclerotic heart disease of native coronary artery without angina pectoris: Secondary | ICD-10-CM | POA: Diagnosis not present

## 2022-03-20 DIAGNOSIS — Z515 Encounter for palliative care: Secondary | ICD-10-CM | POA: Diagnosis not present

## 2022-03-20 DIAGNOSIS — I10 Essential (primary) hypertension: Secondary | ICD-10-CM | POA: Diagnosis present

## 2022-03-20 DIAGNOSIS — Z950 Presence of cardiac pacemaker: Secondary | ICD-10-CM | POA: Diagnosis not present

## 2022-03-20 DIAGNOSIS — R2689 Other abnormalities of gait and mobility: Secondary | ICD-10-CM | POA: Diagnosis not present

## 2022-03-20 DIAGNOSIS — J9600 Acute respiratory failure, unspecified whether with hypoxia or hypercapnia: Secondary | ICD-10-CM | POA: Diagnosis not present

## 2022-03-20 DIAGNOSIS — R000 Tachycardia, unspecified: Secondary | ICD-10-CM | POA: Diagnosis not present

## 2022-03-20 DIAGNOSIS — E8729 Other acidosis: Secondary | ICD-10-CM | POA: Diagnosis not present

## 2022-03-20 DIAGNOSIS — Z7401 Bed confinement status: Secondary | ICD-10-CM | POA: Diagnosis not present

## 2022-03-20 DIAGNOSIS — R0689 Other abnormalities of breathing: Secondary | ICD-10-CM | POA: Diagnosis not present

## 2022-03-20 DIAGNOSIS — Z7982 Long term (current) use of aspirin: Secondary | ICD-10-CM

## 2022-03-20 LAB — I-STAT VENOUS BLOOD GAS, ED
Acid-Base Excess: 17 mmol/L — ABNORMAL HIGH (ref 0.0–2.0)
Acid-Base Excess: 15 mmol/L — ABNORMAL HIGH (ref 0.0–2.0)
Bicarbonate: 46.2 mmol/L — ABNORMAL HIGH (ref 20.0–28.0)
Bicarbonate: 43.3 mmol/L — ABNORMAL HIGH (ref 20.0–28.0)
Calcium, Ion: 1.26 mmol/L (ref 1.15–1.40)
Calcium, Ion: 1.21 mmol/L (ref 1.15–1.40)
HCT: 31 % — ABNORMAL LOW (ref 36.0–46.0)
HCT: 31 % — ABNORMAL LOW (ref 36.0–46.0)
Hemoglobin: 10.5 g/dL — ABNORMAL LOW (ref 12.0–15.0)
Hemoglobin: 10.5 g/dL — ABNORMAL LOW (ref 12.0–15.0)
O2 Saturation: 42 %
O2 Saturation: 38 %
Patient temperature: 98.7
Patient temperature: 98.6
Potassium: 3.4 mmol/L — ABNORMAL LOW (ref 3.5–5.1)
Potassium: 3.4 mmol/L — ABNORMAL LOW (ref 3.5–5.1)
Sodium: 140 mmol/L (ref 135–145)
Sodium: 140 mmol/L (ref 135–145)
TCO2: 49 mmol/L — ABNORMAL HIGH (ref 22–32)
TCO2: 46 mmol/L — ABNORMAL HIGH (ref 22–32)
pCO2, Ven: 90.7 mmHg (ref 44–60)
pCO2, Ven: 79.9 mmHg (ref 44–60)
pH, Ven: 7.315 (ref 7.25–7.43)
pH, Ven: 7.342 (ref 7.25–7.43)
pO2, Ven: 28 mmHg — CL (ref 32–45)
pO2, Ven: 25 mmHg — CL (ref 32–45)

## 2022-03-20 LAB — CBC WITH DIFFERENTIAL/PLATELET
Abs Immature Granulocytes: 0.02 10*3/uL (ref 0.00–0.07)
Basophils Absolute: 0 10*3/uL (ref 0.0–0.1)
Basophils Relative: 0 %
Eosinophils Absolute: 0 10*3/uL (ref 0.0–0.5)
Eosinophils Relative: 0 %
HCT: 35.8 % — ABNORMAL LOW (ref 36.0–46.0)
Hemoglobin: 11 g/dL — ABNORMAL LOW (ref 12.0–15.0)
Immature Granulocytes: 0 %
Lymphocytes Relative: 20 %
Lymphs Abs: 1.6 10*3/uL (ref 0.7–4.0)
MCH: 32.1 pg (ref 26.0–34.0)
MCHC: 30.7 g/dL (ref 30.0–36.0)
MCV: 104.4 fL — ABNORMAL HIGH (ref 80.0–100.0)
Monocytes Absolute: 0.6 10*3/uL (ref 0.1–1.0)
Monocytes Relative: 7 %
Neutro Abs: 6 10*3/uL (ref 1.7–7.7)
Neutrophils Relative %: 73 %
Platelets: 205 10*3/uL (ref 150–400)
RBC: 3.43 MIL/uL — ABNORMAL LOW (ref 3.87–5.11)
RDW: 12.6 % (ref 11.5–15.5)
WBC: 8.3 10*3/uL (ref 4.0–10.5)
nRBC: 0 % (ref 0.0–0.2)

## 2022-03-20 LAB — BASIC METABOLIC PANEL
BUN: 21 mg/dL (ref 8–23)
CO2: >45 mmol/L — ABNORMAL HIGH (ref 22–32)
Calcium: 10.2 mg/dL (ref 8.9–10.3)
Chloride: 93 mmol/L — ABNORMAL LOW (ref 98–111)
Creatinine, Ser: 0.7 mg/dL (ref 0.44–1.00)
GFR, Estimated: >60 mL/min (ref >60)
Glucose, Bld: 176 mg/dL — ABNORMAL HIGH (ref 70–99)
Potassium: 4.2 mmol/L (ref 3.5–5.1)
Sodium: 144 mmol/L (ref 135–145)

## 2022-03-20 LAB — GLUCOSE, CAPILLARY: Glucose-Capillary: 159 mg/dL — ABNORMAL HIGH (ref 70–99)

## 2022-03-20 LAB — RESP PANEL BY RT-PCR (FLU A&B, COVID) ARPGX2
Influenza A by PCR: NEGATIVE
Influenza B by PCR: NEGATIVE
SARS Coronavirus 2 by RT PCR: NEGATIVE

## 2022-03-20 LAB — BLOOD GAS, VENOUS
Acid-Base Excess: 20.8 mmol/L — ABNORMAL HIGH (ref 0.0–2.0)
Bicarbonate: 50.8 mmol/L — ABNORMAL HIGH (ref 20.0–28.0)
O2 Saturation: 98 %
Patient temperature: 37
pCO2, Ven: 84 mmHg (ref 44–60)
pH, Ven: 7.39 (ref 7.25–7.43)
pO2, Ven: 83 mmHg — ABNORMAL HIGH (ref 32–45)

## 2022-03-20 LAB — BRAIN NATRIURETIC PEPTIDE: B Natriuretic Peptide: 46.3 pg/mL (ref 0.0–100.0)

## 2022-03-20 MED ORDER — OYSTER SHELL CALCIUM/D3 500-5 MG-MCG PO TABS
1.0000 | ORAL_TABLET | Freq: Every evening | ORAL | Status: DC
  Administered 2022-03-20 – 2022-03-26 (×7): 1 via ORAL
  Filled 2022-03-20 (×7): qty 1

## 2022-03-20 MED ORDER — DOCUSATE SODIUM 100 MG PO CAPS
100.0000 mg | ORAL_CAPSULE | Freq: Every day | ORAL | Status: DC
  Administered 2022-03-20 – 2022-03-27 (×8): 100 mg via ORAL
  Filled 2022-03-20 (×8): qty 1

## 2022-03-20 MED ORDER — ALBUTEROL SULFATE (2.5 MG/3ML) 0.083% IN NEBU: 2.5000 mg | INHALATION_SOLUTION | RESPIRATORY_TRACT | Status: DC | PRN

## 2022-03-20 MED ORDER — BRIMONIDINE TARTRATE 0.2 % OP SOLN
1.0000 [drp] | Freq: Two times a day (BID) | OPHTHALMIC | Status: DC
  Administered 2022-03-20 – 2022-03-27 (×14): 1 [drp] via OPHTHALMIC
  Filled 2022-03-20: qty 5

## 2022-03-20 MED ORDER — INFLUENZA VAC A&B SA ADJ QUAD 0.5 ML IM PRSY
0.5000 mL | PREFILLED_SYRINGE | INTRAMUSCULAR | Status: AC
Start: 2022-03-21 — End: 2022-03-21
  Administered 2022-03-21: 0.5 mL via INTRAMUSCULAR
  Filled 2022-03-20: qty 0.5

## 2022-03-20 MED ORDER — LEVALBUTEROL HCL 0.63 MG/3ML IN NEBU
0.6300 mg | INHALATION_SOLUTION | Freq: Four times a day (QID) | RESPIRATORY_TRACT | Status: DC
Start: 2022-03-20 — End: 2022-03-20
  Administered 2022-03-20: 0.63 mg via RESPIRATORY_TRACT
  Filled 2022-03-20: qty 3

## 2022-03-20 MED ORDER — BUDESONIDE 0.5 MG/2ML IN SUSP
0.5000 mg | Freq: Two times a day (BID) | RESPIRATORY_TRACT | Status: DC
  Administered 2022-03-20 – 2022-03-27 (×14): 0.5 mg via RESPIRATORY_TRACT
  Filled 2022-03-20 (×14): qty 2

## 2022-03-20 MED ORDER — TORSEMIDE 20 MG PO TABS
20.0000 mg | ORAL_TABLET | Freq: Every day | ORAL | Status: DC
  Administered 2022-03-20 – 2022-03-27 (×8): 20 mg via ORAL
  Filled 2022-03-20 (×8): qty 1

## 2022-03-20 MED ORDER — ARFORMOTEROL TARTRATE 15 MCG/2ML IN NEBU
15.0000 ug | INHALATION_SOLUTION | Freq: Two times a day (BID) | RESPIRATORY_TRACT | Status: DC
  Administered 2022-03-20 – 2022-03-27 (×14): 15 ug via RESPIRATORY_TRACT
  Filled 2022-03-20 (×14): qty 2

## 2022-03-20 MED ORDER — LOSARTAN POTASSIUM 50 MG PO TABS
50.0000 mg | ORAL_TABLET | Freq: Every day | ORAL | Status: DC
  Administered 2022-03-20 – 2022-03-23 (×4): 50 mg via ORAL
  Filled 2022-03-20 (×4): qty 1

## 2022-03-20 MED ORDER — IPRATROPIUM BROMIDE 0.02 % IN SOLN
0.5000 mg | Freq: Four times a day (QID) | RESPIRATORY_TRACT | Status: DC
  Administered 2022-03-20: 0.5 mg via RESPIRATORY_TRACT
  Filled 2022-03-20: qty 2.5

## 2022-03-20 MED ORDER — ASPIRIN 81 MG PO TBEC
81.0000 mg | DELAYED_RELEASE_TABLET | Freq: Every day | ORAL | Status: DC
  Administered 2022-03-20 – 2022-03-27 (×8): 81 mg via ORAL
  Filled 2022-03-20 (×8): qty 1

## 2022-03-20 MED ORDER — LEVALBUTEROL HCL 0.63 MG/3ML IN NEBU
0.6300 mg | INHALATION_SOLUTION | Freq: Three times a day (TID) | RESPIRATORY_TRACT | Status: DC
  Administered 2022-03-21 – 2022-03-22 (×5): 0.63 mg via RESPIRATORY_TRACT
  Filled 2022-03-20 (×4): qty 3

## 2022-03-20 MED ORDER — ENOXAPARIN SODIUM 40 MG/0.4ML IJ SOSY
40.0000 mg | PREFILLED_SYRINGE | INTRAMUSCULAR | Status: DC
  Administered 2022-03-21 – 2022-03-26 (×6): 40 mg via SUBCUTANEOUS
  Filled 2022-03-20 (×7): qty 0.4

## 2022-03-20 MED ORDER — INSULIN ASPART 100 UNIT/ML IJ SOLN
0.0000 [IU] | Freq: Three times a day (TID) | INTRAMUSCULAR | Status: DC
  Administered 2022-03-20 – 2022-03-21 (×3): 2 [IU] via SUBCUTANEOUS
  Administered 2022-03-22 (×3): 1 [IU] via SUBCUTANEOUS
  Administered 2022-03-22: 2 [IU] via SUBCUTANEOUS
  Administered 2022-03-23 (×2): 1 [IU] via SUBCUTANEOUS
  Administered 2022-03-24 (×3): 2 [IU] via SUBCUTANEOUS
  Administered 2022-03-25: 1 [IU] via SUBCUTANEOUS
  Administered 2022-03-25 – 2022-03-26 (×2): 2 [IU] via SUBCUTANEOUS
  Administered 2022-03-26: 3 [IU] via SUBCUTANEOUS
  Administered 2022-03-26 – 2022-03-27 (×2): 1 [IU] via SUBCUTANEOUS

## 2022-03-20 MED ORDER — POLYVINYL ALCOHOL 1.4 % OP SOLN: 1.0000 [drp] | Freq: Three times a day (TID) | OPHTHALMIC | Status: DC | PRN

## 2022-03-20 MED ORDER — ALPRAZOLAM 0.25 MG PO TABS
0.2500 mg | ORAL_TABLET | Freq: Two times a day (BID) | ORAL | Status: DC | PRN
  Administered 2022-03-20 – 2022-03-22 (×2): 0.25 mg via ORAL
  Filled 2022-03-20 (×2): qty 1

## 2022-03-20 MED ORDER — METOPROLOL TARTRATE 25 MG PO TABS
12.5000 mg | ORAL_TABLET | Freq: Two times a day (BID) | ORAL | Status: DC
  Administered 2022-03-20 – 2022-03-27 (×14): 12.5 mg via ORAL
  Filled 2022-03-20 (×14): qty 1

## 2022-03-20 MED ORDER — METHYLPREDNISOLONE SODIUM SUCC 125 MG IJ SOLR
125.0000 mg | Freq: Once | INTRAMUSCULAR | Status: AC
  Administered 2022-03-20: 125 mg via INTRAVENOUS
  Filled 2022-03-20: qty 2

## 2022-03-20 MED ORDER — IPRATROPIUM BROMIDE 0.02 % IN SOLN
0.5000 mg | Freq: Once | RESPIRATORY_TRACT | Status: AC
  Administered 2022-03-20: 0.5 mg via RESPIRATORY_TRACT
  Filled 2022-03-20: qty 2.5

## 2022-03-20 MED ORDER — LORAZEPAM 2 MG/ML IJ SOLN
0.5000 mg | Freq: Once | INTRAMUSCULAR | Status: AC
  Administered 2022-03-20: 0.5 mg via INTRAVENOUS
  Filled 2022-03-20: qty 1

## 2022-03-20 MED ORDER — POTASSIUM CHLORIDE CRYS ER 10 MEQ PO TBCR
10.0000 meq | EXTENDED_RELEASE_TABLET | Freq: Two times a day (BID) | ORAL | Status: DC
  Administered 2022-03-20 – 2022-03-27 (×14): 10 meq via ORAL
  Filled 2022-03-20 (×14): qty 1

## 2022-03-20 MED ORDER — IPRATROPIUM BROMIDE 0.02 % IN SOLN
0.5000 mg | Freq: Three times a day (TID) | RESPIRATORY_TRACT | Status: DC
  Administered 2022-03-21 – 2022-03-22 (×5): 0.5 mg via RESPIRATORY_TRACT
  Filled 2022-03-20 (×4): qty 2.5

## 2022-03-20 MED ORDER — METHYLPREDNISOLONE SODIUM SUCC 125 MG IJ SOLR
60.0000 mg | Freq: Every day | INTRAMUSCULAR | Status: DC
  Administered 2022-03-21 – 2022-03-25 (×5): 60 mg via INTRAVENOUS
  Filled 2022-03-20 (×6): qty 2

## 2022-03-20 MED ORDER — ALBUTEROL SULFATE HFA 108 (90 BASE) MCG/ACT IN AERS
2.0000 | INHALATION_SPRAY | RESPIRATORY_TRACT | Status: DC | PRN
Start: 2022-03-20 — End: 2022-03-20

## 2022-03-20 MED ORDER — ALBUTEROL SULFATE (2.5 MG/3ML) 0.083% IN NEBU
5.0000 mg/h | INHALATION_SOLUTION | Freq: Once | RESPIRATORY_TRACT | Status: AC
  Administered 2022-03-20: 5 mg/h via RESPIRATORY_TRACT
  Filled 2022-03-20: qty 6

## 2022-03-20 MED ORDER — ACETAMINOPHEN 500 MG PO TABS
1000.0000 mg | ORAL_TABLET | Freq: Two times a day (BID) | ORAL | Status: DC | PRN
  Administered 2022-03-22: 1000 mg via ORAL
  Filled 2022-03-20: qty 2

## 2022-03-20 NOTE — Assessment & Plan Note (Addendum)
Continue home potassium 

## 2022-03-20 NOTE — Progress Notes (Signed)
[facility] ED015  [facility] Hospital Liaison note: ? ?This patient is currently enrolled in [facility] outpatient-based Palliative Care. Will continue to follow for disposition. ? ?Please call with any outpatient palliative questions or concerns. ? ?Thank you, ?Dee Curry, LPN ?[facility] Hospital Liaison ?[phone] ?

## 2022-03-20 NOTE — ED Notes (Signed)
RT note: Pt. arrived via [facility] on Nebulizer Aerosol,(10mg  Albuterol/0.5 mg Atrovent) with facemask and currently using 5-6 lpm n/c, "per pt.", is alert & orientated with RT assessment obtained.

## 2022-03-20 NOTE — Assessment & Plan Note (Addendum)
-   Continue aspirin, ARB, metoprolol

## 2022-03-20 NOTE — ED Triage Notes (Signed)
Pt arrives to ED via [facility] with c/o shortness of breath and COPD exacerbation. Pt reports over the past week she has felt increasingly more SOB. EMS gave pt 10mg  Albuterol and 0.5mg  Atrovent. Pt reports being on 3L [place] at rest at home.

## 2022-03-20 NOTE — ED Notes (Signed)
Handoff report given to [facility]link and Paulino Rily on 4E at [employer]

## 2022-03-20 NOTE — ED Notes (Signed)
RT note: Pt. unable to generate more than 80 lpm at this time after X3 attempts.

## 2022-03-20 NOTE — Progress Notes (Signed)
Plan of Care Note for accepted transfer   Patient: SHAWNELLE [name] MRN: [id number]   DOA: 03/20/2022  Facility requesting transfer: [facility]. Requesting Provider: Dr. Rhunette Croft. Reason for transfer: Acute respiratory failure with hypoxia and hypercapnia. Facility course:  Per Dr. Rhunette Croft: " Chief Complaint  Patient presents with   Shortness of Breath   COPD      Rebecca [name] is a 81 y.o. female.   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    Patient reports her shortness of breath has gotten worse over the week and her interventions have not helped. Patient reports that she was taking a 10 mg prednisone taper but stopped a few days ago because she felt like it was not providing any assistance and the medication gave her tremors. She further attests that she has been taking her breathing treatments every 4 hours without any relief, but that she has not taken any rescue albuterol.    Patient does report that she has noticed some increased swelling to her extremities and her fluid medications have not provided much relief. Patient endorses chest tightness. Patient denies any known sick contacts or recent infections. Patient further denies fevers, chills, diaphoresis, cough, or hemoptysis.  Patient reports she is due for her breathing treatment with Ipratropium at 11.    Patient uses 5-6 L of oxygen on a concentrator. When she is walking she uses 4 L and when sitting she uses 3 L."  She received bronchodilators, methylprednisolone 125 mg IVPB 1 was placed on BiPAP ventilation mode.   Resp Panel by RT-PCR (Flu A&B, Covid) Anterior Nasal Swab [[id number]]   Collected: 03/20/22 1135   Updated: 03/20/22 1242   Specimen  Source: Anterior Nasal Swab    SARS Coronavirus 2 by RT PCR NEGATIVE   Influenza A by PCR NEGATIVE   Influenza B by PCR NEGATIVE  Brain natriuretic peptide [[id number]]   Collected: 03/20/22 1008   Updated: 03/20/22 1207   Specimen Type: Blood   Specimen Source: Vein    B Natriuretic Peptide 46.3 pg/mL  Basic metabolic panel [[id number]] (Abnormal)   Collected: 03/20/22 1008   Updated: 03/20/22 1145   Specimen Type: Blood   Specimen Source: Vein    Sodium 144 mmol/L   Potassium 4.2 mmol/L   Chloride 93 Low  mmol/L   CO2 >45 High  mmol/L   Glucose, Bld 176 High  mg/dL   BUN 21 mg/dL   Creatinine, Ser 8.50 mg/dL   Calcium 27.7 mg/dL   GFR, Estimated >41 mL/min  I-Stat venous blood gas, ED [[id number]] (Abnormal)   Collected: 03/20/22 1139   Updated: 03/20/22 1142    pH, Ven 7.315   pCO2, Ven 90.7 High Panic   mmHg   pO2, Ven 28 Low Panic   mmHg   Bicarbonate 46.2 High  mmol/L   TCO2 49 High  mmol/L   O2 Saturation 42 %   Acid-Base Excess 17.0 High  mmol/L   Sodium 140 mmol/L   Potassium 3.4 Low  mmol/L   Calcium, Ion 1.26 mmol/L   HCT 31.0 Low  %   Hemoglobin 10.5 Low  g/dL   Patient temperature 09.4 F   Collection site IV start  Drawn by Nurse   Sample type VENOUS   Comment NOTIFIED PHYSICIAN  CBC with Differential [[id number]] (Abnormal)   Collected: 03/20/22 1008   Updated: 03/20/22 1017   Specimen Type: Blood   Specimen Source: Vein    WBC 8.3 K/uL   RBC 3.43 Low  MIL/uL   Hemoglobin 11.0 Low  g/dL   HCT 86.7 Low  %   MCV 104.4 High  fL   MCH 32.1 pg   MCHC 30.7 g/dL   RDW 67.2 %   Platelets 205 K/uL   nRBC 0.0 %   Neutrophils Relative % 73 %   Neutro Abs 6.0 K/uL   Lymphocytes Relative 20 %   Lymphs Abs 1.6 K/uL   Monocytes Relative 7 %   Monocytes Absolute 0.6 K/uL   Eosinophils Relative 0 %   Eosinophils Absolute 0.0 K/uL   Basophils Relative 0 %   Basophils Absolute 0.0 K/uL   Immature Granulocytes 0 %   Abs Immature Granulocytes 0.02 K/uL    PORTABLE CHEST 1 VIEW   COMPARISON:  07/31/2021   FINDINGS: Lungs are hyperinflated as can be seen with COPD. No focal consolidation. No pleural effusion or pneumothorax. Heart and mediastinal contours are unremarkable.   No acute osseous abnormality.   IMPRESSION: 1. No active disease. 2. COPD.     Electronically Signed   By: Elige Ko M.D.   On: 03/20/2022 10:22     Plan of care: The patient is accepted for admission to Progressive unit, at [facility].  Author: Bobette Mo, MD 03/20/2022  Check [email or web address] for on-call coverage.  [facility] will assume care once the patient arrives to accepting facility. Until arrival, care continues as per default EDP.  Accepting or on call [facility] hospitalist is available for questions and assistance.   Accepting facility nursing staff, please page [facility] Admits and Consults [phone]) as soon as the patient arrives to the hospital.

## 2022-03-20 NOTE — Assessment & Plan Note (Addendum)
Blood pressure slightly elevated - Continue losartan, metoprolol, torsemide

## 2022-03-20 NOTE — H&P (Addendum)
History and Physical    Patient: Rebecca [name] AUQ:[id number] DOB: 1940-12-18 DOA: 03/20/2022 DOS: the patient was seen and examined on 03/20/2022 PCP: Farris Has, MD  Patient coming from:  [facility]  Chief Complaint:  Chief Complaint  Patient presents with   Shortness of Breath   COPD   HPI: Rebecca [name] is a 81 y.o. 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   Symptoms ongoing for the past week at rest and with exertion.  Patient lives alone.  She has chronic dyspnea with baseline O2 on 5-6L and is mostly sedentary because of that. Has chest tightness but no LE edema. Previously on chronic prednisone for past 2 months but was tapered off about 2 weeks ago. Has been taking q4hr nebulizer without relieve. No cough. No fever. No nausea, vomiting, diarrhea or abdominal pain. Quit tobacco 20years ago. No second hand exposure.  In the ED, she was afebrile and tachycardic with heart rate up to 128 and sinus tachycardia, tachypneic with hypercapnia with CO2 of 90.  She was placed on BiPAP but had difficulty tolerating this. No leukocytosis.  Hemoglobin of 11. Mild hypokalemia 3.4, glucose of 176.  Normal creatinine of 0.70  Chest x-ray showed no pneumonia or edema.  Request then made to transfer to [facility] long for further management. She was off Bipap and on home 6L on arrival.   Review of Systems: As mentioned in the history of present illness. All other systems reviewed and are negative. Past Medical History:  Diagnosis Date   Congestive heart disease (HCC)    COPD (chronic obstructive pulmonary disease) (HCC)    Diabetes (HCC) with atherosclerosis    Hyperlipemia    Osteoporosis    Primary hypertension    Past Surgical History:  Procedure Laterality Date   RIGHT/LEFT HEART CATH AND CORONARY ANGIOGRAPHY N/A 06/14/2021    Procedure: RIGHT/LEFT HEART CATH AND CORONARY ANGIOGRAPHY;  Surgeon: Swaziland, [name], MD;  Location: [facility] INVASIVE CV LAB;  Service: Cardiovascular;  Laterality: N/A;   TEMPORARY PACEMAKER N/A 06/14/2021   Procedure: TEMPORARY PACEMAKER;  Surgeon: Swaziland, [name], MD;  Location: [facility] INVASIVE CV LAB;  Service: Cardiovascular;  Laterality: N/A;   Social History:  reports that she quit smoking about 20 years ago. Her smoking use included cigarettes. She has a 60.00 pack-year smoking history. She has never used smokeless tobacco. She reports current alcohol use of about 1.0 standard drink of alcohol per week. She reports that she does not use drugs.  Allergies  Allergen Reactions   Ciprofloxacin Other (See Comments)    Digestive problems   Gabapentin Other (See Comments)    Affects breathing   Pravastatin Sodium Other (See Comments)    Muscle aches    Rosuvastatin Calcium     Muscle Aches    Simvastatin     Muscle aches  Other reaction(s): Muscle pain   Amoxicillin Rash   Lisinopril Rash    Other reaction(s): rash   Sulfacetamide Rash   Tetracyclines & Related Rash    History reviewed. No pertinent family history.  Prior to Admission medications   Medication Sig Start Date End Date Taking? Authorizing Provider  acetaminophen (TYLENOL) 500 MG tablet Take 1,000 mg by mouth 2 (two) times daily as needed for mild pain or headache.   Yes [provider]  albuterol (VENTOLIN HFA) 108 (90 Base) MCG/ACT inhaler Inhale  2 puffs into the lungs every 6 (six) hours as needed for wheezing or shortness of breath. 07/14/21  Yes Medina-Vargas, Monina C, NP  ALPRAZolam (XANAX) 0.5 MG tablet Take 0.5 tablets (0.25 mg total) by mouth 2 (two) times daily as needed for anxiety. 12/31/21  Yes Oretha Milch, MD  arformoterol (BROVANA) 15 MCG/2ML NEBU USE 1 VIAL  IN  NEBULIZER TWICE  DAILY - morning and evening 10/17/21  Yes Parrett, Tammy S, NP  aspirin EC 81 MG tablet Take 81 mg by mouth daily. Swallow  whole.   Yes [provider]  brimonidine (ALPHAGAN) 0.2 % ophthalmic solution Place 1 drop into the left eye 2 (two) times daily. 07/14/21  Yes Medina-Vargas, Monina C, NP  budesonide (PULMICORT) 0.5 MG/2ML nebulizer solution USE 1 VIAL  IN  NEBULIZER TWICE  DAILY - Rinse mouth after treatment 10/17/21  Yes Parrett, Tammy S, NP  Calcium Carbonate-Vitamin D 600-200 MG-UNIT TABS Take 1 tablet by mouth every evening.   Yes [provider]  carboxymethylcellulose (REFRESH PLUS) 0.5 % SOLN Place 1 drop into both eyes 3 (three) times daily as needed (dry eyes).   Yes [provider]  Cranberry-Vitamin C-Probiotic (AZO CRANBERRY) 250-30 MG TABS Take by mouth.   Yes [provider]  ipratropium (ATROVENT) 0.02 % nebulizer solution Take 1.25 mLs (0.25 mg total) by nebulization in the morning, at noon, in the evening, and at bedtime. USE 1 VIAL IN NEBULIZER 3 TIMES DAILY 12/28/21  Yes Oretha Milch, MD  losartan (COZAAR) 50 MG tablet Take 1 tablet (50 mg total) by mouth daily. 07/14/21  Yes Medina-Vargas, Monina C, NP  metFORMIN (GLUCOPHAGE) 500 MG tablet Take 1 tablet (500 mg total) by mouth daily with supper. 07/14/21  Yes Medina-Vargas, Monina C, NP  metoprolol tartrate (LOPRESSOR) 25 MG tablet TAKE 1/2 TABLET TWICE DAILY 01/14/22  Yes Dyann Kief, PA-C  Multiple Vitamins-Minerals (EYE VITAMINS PO) Take 1 tablet by mouth daily.   Yes [provider]  potassium chloride (KLOR-CON M) 10 MEQ tablet Take 1 tablet (10 mEq total) by mouth 2 (two) times daily. 11/07/21  Yes Dyann Kief, PA-C  Probiotic Product (PROBIOTIC PO) Take 1 tablet by mouth daily. Hyperbiotics pro-women   Yes [provider]  torsemide (DEMADEX) 20 MG tablet TAKE 1 TABLET EVERY DAY 01/14/22  Yes Dyann Kief, PA-C  albuterol (PROVENTIL) (2.5 MG/3ML) 0.083% nebulizer solution INHALE 1 VIAL BY NEBULIZATION EVERY DAY IN THE MORNING, AT NOON, AND AT BEDTIME. USE AS DIRECTED. Patient  not taking: Reported on 03/20/2022 10/15/21   Oretha Milch, MD  docusate sodium (COLACE) 100 MG capsule Take 100 mg by mouth daily.    [provider]  fluticasone (FLONASE) 50 MCG/ACT nasal spray 2 sprays as needed.    [provider]  magnesium hydroxide (MILK OF MAGNESIA) 400 MG/5ML suspension If no BM in 3 days, give 30 cc Milk of Magnesium p.o. x 1 dose in 24 hours as needed    [provider]  Polyethylene Glycol 3350 POWD by Does not apply route. TAKE 17GM IN 4-80z OF FLUID OF CHOICE ONCE DAILY FOR CONSTIPATION    [provider]  senna (SENOKOT) 8.6 MG TABS tablet Take 2 tablets by mouth 2 (two) times daily.    [provider]  Sodium Phosphates (RA SALINE ENEMA RE) If not relieved by Biscodyl suppository, give disposable Saline Enema rectally X 1 dose/24 hrs as needed. Contact MD as needed if no results  from enema    [provider]    Physical Exam: Vitals:   03/20/22 1630 03/20/22 1645 03/20/22 1712 03/20/22 1827  BP: 122/62   (!) 175/91  Pulse: (!) 110 (!) 110  (!) 118  Resp: (!) 29 (!) 30  18  Temp:   (!) 97 F (36.1 C) 98.4 F (36.9 C)  TempSrc:   Temporal   SpO2: (!) 89% (!) 88%  98%  Weight:      Height:       Constitutional: NAD, elderly female sitting upright in bed with pursed lip breathing at rest Eyes: lids and conjunctivae normal ENMT: Mucous membranes are moist.  Neck: normal, supple Respiratory: Diminished lung sounds throughout especially in bibasilar region.  No wheezes, crackles or rhonchi.  Has increased labored respiration even at rest.  No accessory muscle use.  Cardiovascular: Regular rate and rhythm, no murmurs / rubs / gallops. No extremity edema. Abdomen: Soft, nondistended no tenderness, Bowel sounds positive.  Musculoskeletal: no clubbing / cyanosis. No joint deformity upper and lower extremities.Normal muscle tone.  Skin: no rashes, lesions, ulcers. No induration Neurologic: CN 2-12 grossly  intact.  Strength 5/5 in all 4.  Psychiatric: Normal judgment and insight. Alert and oriented x 3. Normal mood. Data Reviewed:  See HPI  Assessment and Plan: * Acute on chronic respiratory failure with hypoxia and hypercapnia (HCC) Secondary to COPD exacerbation-likely due to recent taper of chronic steroid and continual worsening of her COPD. Baseline on 5-6L but briefly required Bipap in ED.  -pH of 7.31 with CO2 of 90 prior to Bipap. Will repeat VBG and place back on Bipap if needed -Daily solu-medrol 60mg  IV -Ipratropium-Xopenex q4hr scheduled for the day  -pt follows with pulmonology outpatient and is in the process of trying to initiating BiPAP at home.  Hypokalemia K of 3.4. Continue home potassium supplement BID.   Type 2 diabetes mellitus with complication, without long-term current use of insulin (HCC) Hold home metformin.  Start sensitive SSI  Essential hypertension Mildly elevated.  Continue home losartan, metoprolol, torsemide  CAD (coronary artery disease) Continue aspirin      Advance Care Planning:   Code Status: Partial Code no ventilator  Consults: none  Family Communication: Discussed with daughter at bedside  Severity of Illness: The appropriate patient status for this patient is INPATIENT. Inpatient status is judged to be reasonable and necessary in order to provide the required intensity of service to ensure the patient's safety. The patient's presenting symptoms, physical exam findings, and initial radiographic and laboratory data in the context of their chronic comorbidities is felt to place them at high risk for further clinical deterioration. Furthermore, it is not anticipated that the patient will be medically stable for discharge from the hospital within 2 midnights of admission.   * I certify that at the point of admission it is my clinical judgment that the patient will require inpatient hospital care spanning beyond 2 midnights from the point of  admission due to high intensity of service, high risk for further deterioration and high frequency of surveillance required.*  Author: , DO 03/20/2022 7:56 PM  For on call review [email or web address].

## 2022-03-20 NOTE — Assessment & Plan Note (Addendum)
Glucose controlled - Continue sliding scale corrections - Hold metformin 

## 2022-03-20 NOTE — Assessment & Plan Note (Addendum)
Patient with severe end-stage COPD.  Chest x-ray clear, no fever or chills.  Tight and wheezing on exam.  Symptoms started after tapering down prednisone.     At baseline is on 5 to 6 L, but at presentation, she had PCO2 90, mild acidosis, respiratory distress, and required BiPAP.

## 2022-03-20 NOTE — ED Provider Notes (Addendum)
[facility] EMERGENCY DEPT Provider Note   CSN: [id number] Arrival date & time: 03/20/22  0946     History  Chief Complaint  Patient presents with   Shortness of Breath   COPD    Rebecca [name] is a 81 y.o. female.  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   Patient reports her shortness of breath has gotten worse over the week and her interventions have not helped. Patient reports that she was taking a 10 mg prednisone taper but stopped a few days ago because she felt like it was not providing any assistance and the medication gave her tremors. She further attests that she has been taking her breathing treatments every 4 hours without any relief, but that she has not taken any rescue albuterol.   Patient does report that she has noticed some increased swelling to her extremities and her fluid medications have not provided much relief. Patient endorses chest tightness. Patient denies any known sick contacts or recent infections. Patient further denies fevers, chills, diaphoresis, cough, or hemoptysis.  Patient reports she is due for her breathing treatment with Ipratropium at 11.   Patient uses 5-6 L of oxygen on a concentrator. When she is walking she uses 4 L and when sitting she uses 3 L.  Home Medications Prior to Admission medications   Medication Sig Start Date End Date Taking? Authorizing Provider  acetaminophen (TYLENOL) 500 MG tablet Take 1,000 mg by mouth 2 (two) times daily as needed for mild pain or headache.   Yes [provider]  albuterol (VENTOLIN HFA) 108 (90 Base) MCG/ACT inhaler Inhale 2 puffs into the lungs every 6 (six) hours as needed for wheezing or shortness of  breath. 07/14/21  Yes Medina-Vargas, Monina C, NP  ALPRAZolam (XANAX) 0.5 MG tablet Take 0.5 tablets (0.25 mg total) by mouth 2 (two) times daily as needed for anxiety. 12/31/21  Yes Oretha Milch, MD  arformoterol (BROVANA) 15 MCG/2ML NEBU USE 1 VIAL  IN  NEBULIZER TWICE  DAILY - morning and evening 10/17/21  Yes Parrett, Tammy S, NP  aspirin EC 81 MG tablet Take 81 mg by mouth daily. Swallow whole.   Yes [provider]  brimonidine (ALPHAGAN) 0.2 % ophthalmic solution Place 1 drop into the left eye 2 (two) times daily. 07/14/21  Yes Medina-Vargas, Monina C, NP  budesonide (PULMICORT) 0.5 MG/2ML nebulizer solution USE 1 VIAL  IN  NEBULIZER TWICE  DAILY - Rinse mouth after treatment 10/17/21  Yes Parrett, Tammy S, NP  Calcium Carbonate-Vitamin D 600-200 MG-UNIT TABS Take 1 tablet by mouth every evening.   Yes [provider]  carboxymethylcellulose (REFRESH PLUS) 0.5 % SOLN Place 1 drop into both eyes 3 (three) times daily as needed (dry eyes).   Yes [provider]  Cranberry-Vitamin C-Probiotic (AZO CRANBERRY) 250-30 MG TABS Take by mouth.   Yes [provider]  ipratropium (ATROVENT) 0.02 % nebulizer solution Take 1.25 mLs (0.25 mg total) by nebulization in the morning, at noon, in the evening, and at bedtime. USE 1 VIAL IN NEBULIZER 3 TIMES DAILY 12/28/21  Yes Oretha Milch, MD  losartan (COZAAR) 50 MG tablet Take 1 tablet (50 mg total)  by mouth daily. 07/14/21  Yes Medina-Vargas, Monina C, NP  metFORMIN (GLUCOPHAGE) 500 MG tablet Take 1 tablet (500 mg total) by mouth daily with supper. 07/14/21  Yes Medina-Vargas, Monina C, NP  metoprolol tartrate (LOPRESSOR) 25 MG tablet TAKE 1/2 TABLET TWICE DAILY 01/14/22  Yes Dyann Kief, PA-C  Multiple Vitamins-Minerals (EYE VITAMINS PO) Take 1 tablet by mouth daily.   Yes [provider]  potassium chloride (KLOR-CON M) 10 MEQ tablet Take 1 tablet (10 mEq total) by mouth 2 (two) times daily. 11/07/21  Yes Dyann Kief, PA-C  Probiotic Product (PROBIOTIC PO) Take 1 tablet by mouth daily. Hyperbiotics pro-women   Yes [provider]  torsemide (DEMADEX) 20 MG tablet TAKE 1 TABLET EVERY DAY 01/14/22  Yes Dyann Kief, PA-C  albuterol (PROVENTIL) (2.5 MG/3ML) 0.083% nebulizer solution INHALE 1 VIAL BY NEBULIZATION EVERY DAY IN THE MORNING, AT NOON, AND AT BEDTIME. USE AS DIRECTED. Patient not taking: Reported on 03/20/2022 10/15/21   Oretha Milch, MD  docusate sodium (COLACE) 100 MG capsule Take 100 mg by mouth daily.    [provider]  fluticasone (FLONASE) 50 MCG/ACT nasal spray 2 sprays as needed.    [provider]  magnesium hydroxide (MILK OF MAGNESIA) 400 MG/5ML suspension If no BM in 3 days, give 30 cc Milk of Magnesium p.o. x 1 dose in 24 hours as needed    [provider]  Polyethylene Glycol 3350 POWD by Does not apply route. TAKE 17GM IN 4-80z OF FLUID OF CHOICE ONCE DAILY FOR CONSTIPATION    [provider]  senna (SENOKOT) 8.6 MG TABS tablet Take 2 tablets by mouth 2 (two) times daily.    [provider]  Sodium Phosphates (RA SALINE ENEMA RE) If not relieved by Biscodyl suppository, give disposable Saline Enema rectally X 1 dose/24 hrs as needed. Contact MD as needed if no results from enema    [provider]      Allergies    Ciprofloxacin, Gabapentin, Pravastatin sodium, Rosuvastatin calcium, Simvastatin, Amoxicillin, Lisinopril, Sulfacetamide, and Tetracyclines & related    Review of Systems   Review of Systems  All other systems reviewed and are negative.   Physical Exam Updated Vital Signs BP (!) 128/56   Pulse (!) 111   Temp 98.7 F (37.1 C) (Oral)   Resp (!) 30   Ht 5\' 2"  (1.575 m)   Wt 68.9 kg   SpO2 100%   BMI 27.80 kg/m  Physical Exam Vitals and nursing note reviewed.  Constitutional:      Appearance: She is well-developed.  HENT:     Head: Atraumatic.  Neck:     Vascular: No JVD.   Cardiovascular:     Rate and Rhythm: Tachycardia present.  Pulmonary:     Effort: Tachypnea present.     Breath sounds: Decreased breath sounds present. No wheezing.  Musculoskeletal:     Cervical back: Normal range of motion and neck supple.     Right lower leg: No edema.     Left lower leg: No edema.  Skin:    General: Skin is warm and dry.  Neurological:     Mental Status: She is alert and oriented to person, place, and time.     ED Results / Procedures / Treatments   Labs (all labs ordered are listed, but only abnormal results are displayed) Labs Reviewed  CBC WITH DIFFERENTIAL/PLATELET - Abnormal; Notable for the following components:  Result Value   RBC 3.43 (*)    Hemoglobin 11.0 (*)    HCT 35.8 (*)    MCV 104.4 (*)    All other components within normal limits  BASIC METABOLIC PANEL - Abnormal; Notable for the following components:   Chloride 93 (*)    CO2 >45 (*)    Glucose, Bld 176 (*)    All other components within normal limits  I-STAT VENOUS BLOOD GAS, ED - Abnormal; Notable for the following components:   pCO2, Ven 90.7 (*)    pO2, Ven 28 (*)    Bicarbonate 46.2 (*)    TCO2 49 (*)    Acid-Base Excess 17.0 (*)    Potassium 3.4 (*)    HCT 31.0 (*)    Hemoglobin 10.5 (*)    All other components within normal limits  RESP PANEL BY RT-PCR (FLU A&B, COVID) ARPGX2  BRAIN NATRIURETIC PEPTIDE  BLOOD GAS, VENOUS  BLOOD GAS, VENOUS    EKG EKG Interpretation  Date/Time:  Wednesday March 20 2022 09:59:56 EDT Ventricular Rate:  125 PR Interval:  120 QRS Duration: 140 QT Interval:  349 QTC Calculation: 504 R Axis:   56 Text Interpretation: Sinus tachycardia Left bundle branch block No acute changes Nonspecific ST and T wave abnormality No significant change since last tracing Confirmed by Derwood Kaplan ([id number]) on 03/20/2022 11:21:30 AM  Radiology DG Chest Port 1 View  Result Date: 03/20/2022 CLINICAL DATA:  Shortness of breath and COPD EXAM:  PORTABLE CHEST 1 VIEW COMPARISON:  07/31/2021 FINDINGS: Lungs are hyperinflated as can be seen with COPD. No focal consolidation. No pleural effusion or pneumothorax. Heart and mediastinal contours are unremarkable. No acute osseous abnormality. IMPRESSION: 1. No active disease. 2. COPD. Electronically Signed   By: Elige Ko M.D.   On: 03/20/2022 10:22    Procedures .Critical Care  Performed by: Derwood Kaplan, MD Authorized by: Derwood Kaplan, MD   Critical care provider statement:    Critical care time (minutes):  42   Critical care was necessary to treat or prevent imminent or life-threatening deterioration of the following conditions:  Respiratory failure   Critical care was time spent personally by me on the following activities:  Development of treatment plan with patient or surrogate, discussions with consultants, evaluation of patient's response to treatment, examination of patient, ordering and review of laboratory studies, ordering and review of radiographic studies, ordering and performing treatments and interventions, pulse oximetry, re-evaluation of patient's condition and review of old charts     Medications Ordered in ED Medications  albuterol (VENTOLIN HFA) 108 (90 Base) MCG/ACT inhaler 2 puff (has no administration in time range)  methylPREDNISolone sodium succinate (SOLU-MEDROL) 125 mg/2 mL injection 125 mg (125 mg Intravenous Given 03/20/22 1306)  albuterol (PROVENTIL) (2.5 MG/3ML) 0.083% nebulizer solution (5 mg/hr Nebulization Given 03/20/22 1318)  ipratropium (ATROVENT) nebulizer solution 0.5 mg (0.5 mg Nebulization Given 03/20/22 1318)  LORazepam (ATIVAN) injection 0.5 mg (0.5 mg Intravenous Given 03/20/22 1457)    ED Course/ Medical Decision Making/ A&P Clinical Course as of 03/20/22 1503  Wed Mar 20, 2022  1501 Patient having difficulty tolerating BiPAP.  RT said that patient was able to tolerate BiPAP for about 10 minutes only.  Thereafter, she was going to the  bathroom and became profoundly short of breath.  Her heart rate jumped to 140s.  On my reassessment she is appearing calmer now.  Heart rate has come down to 115 and she is breathing about 25 times a  minute.  She is able to complete full sentences.  We have agreed that she will take 0.5 mg of Ativan.  She has been prescribed Xanax as needed.  With Ativan we hope that she is able to tolerate BiPAP.  Repeat VBG for 330 has been ordered.  For now the plan to proceed with telemetry admission remains the best next step.  However, if her PCO2 is getting worse, then we will contact medicine team to change the disposition to stepdown bed.  Incoming team will be made aware of this disposition plan. [AN]    Clinical Course User Index [AN] Derwood Kaplan, MD                           Medical Decision Making Amount and/or Complexity of Data Reviewed Labs: ordered. Radiology: ordered.  Risk Prescription drug management. Decision regarding hospitalization.   This patient presents to the ED with chief complaint(s) of shortness of breath with pertinent past medical history of advanced COPD, CHF.The complaint involves an extensive differential diagnosis and also carries with it a high risk of complications and morbidity.    The differential diagnosis includes COPD exacerbation, CHF exacerbation, pneumonia, pulmonary edema, pleural effusion  The initial plan is to get basic labs including venous blood gas.  The goal is to make sure patient is not having any hypercapnic respiratory failure.  She is tachypneic and indicates that prior to EMS arriving, she was extremely short of breath and could not breathe well at all.  X-ray of the chest, COVID-19 test also ordered.  Additional history obtained: Additional history obtained from family Records reviewed  pulmonary documentation of her chronic condition  Independent labs interpretation:  The following labs were independently interpreted: Patient's venous  blood gas shows hypercapnia along with acute on chronic respiratory acidosis as the pH is 7.1. CBC is reassuring.  BNP is normal.  COVID-19 test is negative.  Independent visualization and interpretation of imaging: - I independently visualized the following imaging with scope of interpretation limited to determining acute life threatening conditions related to emergency care: X-ray of the chest, which revealed no evidence of pleural effusion, pulmonary edema, pneumothorax, focal consolidation  Treatment and Reassessment: Patient reassessed after the initial lab work-up.  We will start her on BiPAP given the slight hypercapnia and tachypnea. This will be a trial for about an hour and then we will discontinue it.  Normally patient is not on BiPAP. She will also get a repeat breathing treatment.  Patient will need admission to the hospital for her COPD exacerbation.   Final Clinical Impression(s) / ED Diagnoses Final diagnoses:  Acute hypercapnic respiratory failure (HCC)  COPD exacerbation (HCC)    Rx / DC Orders ED Discharge Orders     None         Derwood Kaplan, MD 03/20/22 1503

## 2022-03-21 ENCOUNTER — Other Ambulatory Visit: Payer: Medicare HMO | Admitting: Family Medicine

## 2022-03-21 DIAGNOSIS — D638 Anemia in other chronic diseases classified elsewhere: Secondary | ICD-10-CM | POA: Diagnosis not present

## 2022-03-21 DIAGNOSIS — I5042 Chronic combined systolic (congestive) and diastolic (congestive) heart failure: Secondary | ICD-10-CM

## 2022-03-21 DIAGNOSIS — J9621 Acute and chronic respiratory failure with hypoxia: Secondary | ICD-10-CM | POA: Diagnosis not present

## 2022-03-21 DIAGNOSIS — I2510 Atherosclerotic heart disease of native coronary artery without angina pectoris: Secondary | ICD-10-CM | POA: Diagnosis not present

## 2022-03-21 LAB — CBC
HCT: 32.2 % — ABNORMAL LOW (ref 36.0–46.0)
Hemoglobin: 10 g/dL — ABNORMAL LOW (ref 12.0–15.0)
MCH: 31.9 pg (ref 26.0–34.0)
MCHC: 31.1 g/dL (ref 30.0–36.0)
MCV: 102.9 fL — ABNORMAL HIGH (ref 80.0–100.0)
Platelets: 198 10*3/uL (ref 150–400)
RBC: 3.13 MIL/uL — ABNORMAL LOW (ref 3.87–5.11)
RDW: 12.6 % (ref 11.5–15.5)
WBC: 7.5 10*3/uL (ref 4.0–10.5)
nRBC: 0 % (ref 0.0–0.2)

## 2022-03-21 LAB — BASIC METABOLIC PANEL
Anion gap: 9 (ref 5–15)
BUN: 21 mg/dL (ref 8–23)
CO2: 41 mmol/L — ABNORMAL HIGH (ref 22–32)
Calcium: 9.5 mg/dL (ref 8.9–10.3)
Chloride: 89 mmol/L — ABNORMAL LOW (ref 98–111)
Creatinine, Ser: 0.66 mg/dL (ref 0.44–1.00)
GFR, Estimated: >60 mL/min (ref >60)
Glucose, Bld: 118 mg/dL — ABNORMAL HIGH (ref 70–99)
Potassium: 3.9 mmol/L (ref 3.5–5.1)
Sodium: 139 mmol/L (ref 135–145)

## 2022-03-21 LAB — HEMOGLOBIN A1C
Hgb A1c MFr Bld: 6 % — ABNORMAL HIGH (ref 4.8–5.6)
Mean Plasma Glucose: 125.5 mg/dL

## 2022-03-21 LAB — GLUCOSE, CAPILLARY
Glucose-Capillary: 113 mg/dL — ABNORMAL HIGH (ref 70–99)
Glucose-Capillary: 99 mg/dL (ref 70–99)
Glucose-Capillary: 165 mg/dL — ABNORMAL HIGH (ref 70–99)
Glucose-Capillary: 161 mg/dL — ABNORMAL HIGH (ref 70–99)

## 2022-03-21 MED ORDER — GABAPENTIN 100 MG PO CAPS
200.0000 mg | ORAL_CAPSULE | Freq: Once | ORAL | Status: DC
Start: 2022-03-21 — End: 2022-03-21

## 2022-03-21 MED ORDER — SODIUM CHLORIDE 0.9 % IV SOLN
500.0000 mg | INTRAVENOUS | Status: DC
  Administered 2022-03-21 – 2022-03-23 (×3): 500 mg via INTRAVENOUS
  Filled 2022-03-21 (×4): qty 5

## 2022-03-21 MED ORDER — PANTOPRAZOLE SODIUM 20 MG PO TBEC
20.0000 mg | DELAYED_RELEASE_TABLET | Freq: Two times a day (BID) | ORAL | Status: DC
  Administered 2022-03-21 – 2022-03-27 (×12): 20 mg via ORAL
  Filled 2022-03-21 (×12): qty 1

## 2022-03-21 MED ORDER — POLYETHYLENE GLYCOL 3350 17 G PO PACK
17.0000 g | PACK | Freq: Every day | ORAL | Status: DC | PRN
  Administered 2022-03-22 – 2022-03-25 (×4): 17 g via ORAL
  Filled 2022-03-21 (×4): qty 1

## 2022-03-21 MED ORDER — GABAPENTIN 300 MG PO CAPS
300.0000 mg | ORAL_CAPSULE | Freq: Once | ORAL | Status: AC
  Administered 2022-03-21: 300 mg via ORAL
  Filled 2022-03-21: qty 1

## 2022-03-21 NOTE — Hospital Course (Signed)
Mrs. Stanaland is an 81 y.o. F with end stage COPD, chronic respiratory failure with hypoxia and hypercapnia, sCHF EF 40-45%, and HTN who presented with progressive shortness of breath, now severe, after tapering off an extended prednisone course 2 weeks ago.  In the ER, tachypneic and tachycardic.  CXR clear.      9/6: Admitted on IV steroids, bronchodilators

## 2022-03-21 NOTE — Progress Notes (Signed)
       CROSS COVER NOTE  NAME: [name] MRN: [id number] DOB : [birth year]    Date of Service   03/21/2022   HPI/Events of Note   Medication request received for home Miralax.  Interventions   Plan: Miralax PRN     This document was prepared using Dragon voice recognition software and may include unintentional dictation errors.  Bishop Limbo DNP, MHA, FNP-BC Nurse Practitioner [facility] ([facility]) Pager [phone]

## 2022-03-21 NOTE — Assessment & Plan Note (Addendum)
No evidence of fluid overload on chest x-ray or clinical exam - Continue ARB, metoprolol, torsemide

## 2022-03-21 NOTE — Assessment & Plan Note (Addendum)
Hemoglobin stable relative to baseline, no clinical bleeding 

## 2022-03-21 NOTE — Assessment & Plan Note (Addendum)
FEV 31% predicted in 2019 No improvement today -Continue Solu-Medrol, Brovana, bronchodilators, antibiotics, PPI - Add morphine - Add gabapentin QHS

## 2022-03-21 NOTE — Progress Notes (Signed)
  Progress Note   Patient: Rebecca [name] WUJ:[id number] DOB: 01/02/1941 DOA: 03/20/2022     1 DOS: the patient was seen and examined on 03/21/2022 at 11:27 AM      Brief hospital course: Rebecca [name] is an 81 y.o. F with end stage COPD, chronic respiratory failure with hypoxia and hypercapnia, sCHF EF 40-45%, and HTN who presented with progressive shortness of breath, now severe, after tapering off an extended prednisone course 2 weeks ago.  In the ER, tachypneic and tachycardic.  CXR clear.      9/6: Admitted on IV steroids, bronchodilators      Assessment and Plan: * Acute on chronic respiratory failure with hypoxia and hypercapnia (HCC) Patient with severe end-stage COPD.  Chest x-ray clear, no fever or chills.  Tight and wheezing on exam.  Symptoms started after tapering down prednisone.  Improving today on steroids, bronchodilators, and antibiotics.  At baseline is on 5 to 6 L, but at presentation, she had PCO2 90, mild acidosis, respiratory distress, and required BiPAP.    Chronic obstructive pulmonary disease with acute exacerbation (HCC) FEV 31% predicted in 2019 -Continue Solu-Medrol - Continue Brovana - Continue scheduled bronchodilators - Start antibiotics and PPI  Anemia, chronic disease Hemoglobin stable relative to baseline, no clinical bleeding  Chronic combined systolic and diastolic CHF (congestive heart failure) (HCC) No evidence of fluid overload on chest x-ray or clinical exam - Continue ARB, metoprolol, torsemide  Hypokalemia - Continue home potassium  Type 2 diabetes mellitus with complication, without long-term current use of insulin (HCC) Glucose controlled - Continue sliding scale corrections - Hold metformin  Essential hypertension Blood pressure controlled - Continue losartan, metoprolol, torsemide  CAD (coronary artery disease) - Continue aspirin, ARB, metoprolol          Subjective: Patient is somewhat better, she has less  chest tightness, she still has a cough, she still feels out of breath at rest.     Physical Exam: BP (!) 157/67   Pulse 94   Temp 98.2 F (36.8 C) (Oral)   Resp 18   Ht 5\' 2"  (1.575 m)   Wt 68.9 kg   SpO2 91%   BMI 27.80 kg/m   Elderly adult female, lying in bed, appears breathless with movement in bed RRR, no murmurs, no peripheral edema, JVP normal Respiratory rate, increased, lung sounds tight and diminished bilaterally, scant wheezing, overall markedly diminished Abdomen soft without tenderness palpation or guarding, no ascites or distention Attention normal, affect appropriate, judgment and insight appear normal, face symmetric, speech fluent    Data Reviewed: VBG shows normal pH, elevated PCO2 Bicarb monitor basic metabolic panel is greater than 45, sodium, potassium, creatinine are normal Hemogram shows normal white count, mild anemia, normal platelets COVID-negative Chest x-ray clear    Family Communication: Daughters at the bedside, prognosis and trajectory of end-stage COPD was discussed.  They understand her overall worsening condition, with diminishing returns from antibiotics, inhaled bronchodilators and steroids.  They are more interested in palliative measures at this time.    Disposition: Status is: Inpatient         Author: , MD 03/21/2022 3:32 PM  For on call review [email or web address].

## 2022-03-22 DIAGNOSIS — I5042 Chronic combined systolic (congestive) and diastolic (congestive) heart failure: Secondary | ICD-10-CM | POA: Diagnosis not present

## 2022-03-22 DIAGNOSIS — D638 Anemia in other chronic diseases classified elsewhere: Secondary | ICD-10-CM | POA: Diagnosis not present

## 2022-03-22 DIAGNOSIS — Z789 Other specified health status: Secondary | ICD-10-CM

## 2022-03-22 DIAGNOSIS — Z515 Encounter for palliative care: Secondary | ICD-10-CM

## 2022-03-22 DIAGNOSIS — J441 Chronic obstructive pulmonary disease with (acute) exacerbation: Secondary | ICD-10-CM | POA: Diagnosis not present

## 2022-03-22 DIAGNOSIS — J9621 Acute and chronic respiratory failure with hypoxia: Secondary | ICD-10-CM | POA: Diagnosis not present

## 2022-03-22 DIAGNOSIS — I2510 Atherosclerotic heart disease of native coronary artery without angina pectoris: Secondary | ICD-10-CM | POA: Diagnosis not present

## 2022-03-22 LAB — GLUCOSE, CAPILLARY
Glucose-Capillary: 131 mg/dL — ABNORMAL HIGH (ref 70–99)
Glucose-Capillary: 141 mg/dL — ABNORMAL HIGH (ref 70–99)
Glucose-Capillary: 199 mg/dL — ABNORMAL HIGH (ref 70–99)
Glucose-Capillary: 124 mg/dL — ABNORMAL HIGH (ref 70–99)

## 2022-03-22 MED ORDER — LEVALBUTEROL HCL 0.63 MG/3ML IN NEBU
0.6300 mg | INHALATION_SOLUTION | Freq: Four times a day (QID) | RESPIRATORY_TRACT | Status: DC
  Administered 2022-03-22: 0.63 mg via RESPIRATORY_TRACT
  Filled 2022-03-22: qty 3

## 2022-03-22 MED ORDER — LEVALBUTEROL HCL 0.63 MG/3ML IN NEBU
0.6300 mg | INHALATION_SOLUTION | Freq: Four times a day (QID) | RESPIRATORY_TRACT | Status: DC
  Administered 2022-03-23 – 2022-03-25 (×9): 0.63 mg via RESPIRATORY_TRACT
  Filled 2022-03-22 (×9): qty 3

## 2022-03-22 MED ORDER — IPRATROPIUM BROMIDE 0.02 % IN SOLN
0.5000 mg | Freq: Four times a day (QID) | RESPIRATORY_TRACT | Status: DC
  Administered 2022-03-22: 0.5 mg via RESPIRATORY_TRACT
  Filled 2022-03-22: qty 2.5

## 2022-03-22 MED ORDER — HYDRALAZINE HCL 25 MG PO TABS
25.0000 mg | ORAL_TABLET | Freq: Three times a day (TID) | ORAL | Status: DC | PRN
  Administered 2022-03-22: 25 mg via ORAL
  Filled 2022-03-22: qty 1

## 2022-03-22 MED ORDER — GABAPENTIN 100 MG PO CAPS
200.0000 mg | ORAL_CAPSULE | Freq: Every day | ORAL | Status: DC
Start: 2022-03-22 — End: 2022-03-26
  Administered 2022-03-22 – 2022-03-25 (×4): 200 mg via ORAL
  Filled 2022-03-22 (×4): qty 2

## 2022-03-22 MED ORDER — MORPHINE SULFATE 10 MG/5ML PO SOLN
2.5000 mg | ORAL | Status: DC | PRN
  Administered 2022-03-22 – 2022-03-27 (×12): 2.5 mg via ORAL
  Filled 2022-03-22 (×12): qty 5

## 2022-03-22 MED ORDER — IPRATROPIUM BROMIDE 0.02 % IN SOLN
0.5000 mg | Freq: Four times a day (QID) | RESPIRATORY_TRACT | Status: DC
  Administered 2022-03-23 – 2022-03-25 (×9): 0.5 mg via RESPIRATORY_TRACT
  Filled 2022-03-22 (×9): qty 2.5

## 2022-03-22 NOTE — Consult Note (Signed)
Consultation Note Date: 03/22/2022   Patient Name: Rebecca [name]  DOB: 1941/02/16  MRN: [id number]  Age / Sex: 81 y.o., female  PCP: London Pepper, MD Referring Physician: Edwin Dada, *  Reason for Consultation: Establishing goals of care  HPI/Patient Profile: 81 y.o. female  with past medical history of end-stage COPD, chronic respiratory failure with hypoxia and hypercapnia, CAD, type 2 diabetes, anemia, hypokalemia, systolic CHF (EF 40 to 21%) and HTN admitted on 03/20/2022 with progressive shortness of breath.   Palliative medicine team was consulted to discuss goals of care.  Patient is an established patient of outpatient palliative services.  Clinical Assessment and Goals of Care: I have reviewed medical records including EPIC notes, labs and imaging, assessed the patient and then met with patient and her daughter [name] at bedside to discuss diagnosis prognosis, [place], EOL wishes, disposition and options.  I introduced Palliative Medicine as specialized medical care for people living with serious illness. It focuses on providing relief from the symptoms and stress of a serious illness. The goal is to improve quality of life for both the patient and the family.  We discussed a brief life review of the patient.  Patient worked in [employer] the majority of her working [employer], [employer]ery.  She enjoyed oral painting and gardening.  She lives in the independent section of a retirement community in [place] and both of her daughters live nearby.  As far as functional and nutritional status patient shares she is constant worrying that she cannot breathe.  She is unable to walk across the room without feeling as though she cannot breathe.  She endorses complete independence prior to admission but also shares she was having difficulty with completing ADLs since she was so short of breath constantly.  We discussed  patient's current illness and progressive, irreversible nature of COPD. I attempted to elicit values and goals of care important to the patient. Patient states she wants to be able to breath better.  Discussed use of low dose morphine in addition to current regimen in order to relieve air hunger and increased WOB. Pt states she is fine with this as Dr. Loleta Books had already outlined this as an add on therapy for her. Counseled with Dr. Loleta Books about above discussion.   Advance directives, concepts specific to code status, artificial feeding and hydration, and rehospitalization were considered and discussed. Patient states she does not want anything to prolong her life. She states she does not want anything artifical to keep her alive. Encouraged patient/family to consider DNR/DNI status understanding evidenced based poor outcomes in similar hospitalized patients, as the cause of the arrest is likely associated with chronic/terminal disease rather than a reversible acute cardio-pulmonary event.   Hospice and Palliative Care services outpatient were explained. Patient is already established with outpatient palliative services through [place].  Questions and concerns were addressed. The family was encouraged to call with questions or concerns.  Primary Decision Maker PATIENT  Code Status/Advance Care Planning: Limited code  Prognosis:   < 6 months  Discharge Planning: [facility] for rehab with Palliative care service follow-up  Primary Diagnoses: Present on Admission:  Acute on chronic respiratory failure with hypoxia and hypercapnia (HCC)  Chronic obstructive pulmonary disease with acute exacerbation (HCC)  Essential hypertension  CAD (coronary artery disease)   Physical Exam Vitals reviewed.  Constitutional:      General: She is not in acute distress.    Appearance: She is obese. She is not ill-appearing.  HENT:     Head: Normocephalic.  Cardiovascular:     Rate and  Rhythm: Normal rate.  Pulmonary:     Breath sounds: Wheezing present.     Comments: Dyspnea at rest Musculoskeletal:        General: Normal range of motion.  Skin:    General: Skin is warm and dry.  Neurological:     Mental Status: She is alert and oriented to person, place, and time.  Psychiatric:        Mood and Affect: Mood normal. Mood is not anxious.        Behavior: Behavior normal. Behavior is not agitated.     Palliative Assessment/Data: 40%     Thank you for this consult. Palliative medicine will continue to follow and assist holistically.   Time Total: 75 minutes Greater than 50%  of this time was spent counseling and coordinating care related to the above assessment and plan.  Signed by: Jordan Hawks, DNP, FNP-BC Palliative Medicine    Please contact Palliative Medicine Team phone at [phone] for questions and concerns.  For individual provider: See Shea Evans

## 2022-03-22 NOTE — Evaluation (Signed)
Physical Therapy Evaluation Patient Details Name: Rebecca [name] MRN: [id number] DOB: July 18, 1940 Today's Date: 03/22/2022  History of Present Illness  Pt is a 81 yo female admitted with Acute on chronic respiratory failure with hypoxia and hypercapnia. PMH: end stage COPD, chronic respiratory failure with hypoxia and hypercapnia, sCHF EF 40-45%, and HTN  Clinical Impression  Pt admitted with above diagnosis. Pt from ILF apartment, ambulates with rollator, denies falls, on 5L O2, goes to dining halls for meals, facility provides housecleaning, ind with self care. Pt limited by SOB, ambulates ~10 ft with RW before needing seated rest break. Pt able to transfer to [facility] with supv for safety. Pt significantly limited in ambulation tolerance, would benefit from ST SNF for strengthening prior to return to ind apartment alone. Pt on 4L due to tank constraints with SpO2 92-96%. Pt currently with functional limitations due to the deficits listed below (see PT Problem List). Pt will benefit from skilled PT to increase their independence and safety with mobility to allow discharge to the venue listed below.          Recommendations for follow up therapy are one component of a multi-disciplinary discharge planning process, led by the attending physician.  Recommendations may be updated based on patient status, additional functional criteria and insurance authorization.  Follow Up Recommendations Skilled nursing-short term rehab (<3 hours/day) Can patient physically be transported by private vehicle: Yes    Assistance Recommended at Discharge Frequent or constant Supervision/Assistance  Patient can return home with the following  A little help with walking and/or transfers;A little help with bathing/dressing/bathroom;Assistance with cooking/housework    Equipment Recommendations None recommended by PT  Recommendations for Other Services       Functional Status Assessment Patient has had a recent decline  in their functional status and demonstrates the ability to make significant improvements in function in a reasonable and predictable amount of time.     Precautions / Restrictions Precautions Precautions: Fall Precaution Comments: monitor O2 Restrictions Weight Bearing Restrictions: No      Mobility  Bed Mobility Overal bed mobility: Modified Independent  General bed mobility comments: slow, labored movement    Transfers Overall transfer level: Needs assistance Equipment used: Rolling walker (2 wheels) Transfers: Sit to/from Stand, Bed to chair/wheelchair/BSC Sit to Stand: Supervision Stand pivot transfers: Supervision   General transfer comment: pt powers to stand with BUE assisting, slow movement, good safety awareness with O2 tubing management    Ambulation/Gait Ambulation/Gait assistance: Min guard Gait Distance (Feet): 10 Feet Assistive device: Rolling walker (2 wheels) Gait Pattern/deviations: Step-through pattern, Decreased dorsiflexion - left Gait velocity: decreased  General Gait Details: slow, step through steps with trunk slightly flexed, good bil foot clearance, dyspnea 4/4 limiting distance, on 4L with SpO2 93%  Stairs            Wheelchair Mobility    Modified Rankin (Stroke Patients Only)       Balance Overall balance assessment: No apparent balance deficits (not formally assessed)        Pertinent Vitals/Pain Pain Assessment Pain Assessment: No/denies pain    Home Living Family/patient expects to be discharged to:: Assisted living  Home Equipment: Rollator (4 wheels);Shower seat;Grab bars - toilet;Grab bars - tub/shower Additional Comments: Pt lives in ILF, receives house cleaning    Prior Function Prior Level of Function : Independent/Modified Independent  Mobility Comments: pt reports using rollator for community distances, ambulates to dining hall for meals, denies falls ADLs Comments: pt reports ind with  self care, facility provides  housecleaning     Hand Dominance   Dominant Hand: Right    Extremity/Trunk Assessment   Upper Extremity Assessment Upper Extremity Assessment: Overall WFL for tasks assessed    Lower Extremity Assessment Lower Extremity Assessment: Generalized weakness (AROM WNL, strength grossly 3+/5, "a little" numbness/tingling in bil feet)    Cervical / Trunk Assessment Cervical / Trunk Assessment: Normal  Communication   Communication: No difficulties  Cognition Arousal/Alertness: Awake/alert Behavior During Therapy: WFL for tasks assessed/performed, Anxious Overall Cognitive Status: Within Functional Limits for tasks assessed   General Comments: pt appears anxious with mobility at times        General Comments General comments (skin integrity, edema, etc.): Pt on 4L due to tank constraints with SpO2 92-96% with ambulation    Exercises     Assessment/Plan    PT Assessment Patient needs continued PT services  PT Problem List Decreased strength;Decreased activity tolerance;Cardiopulmonary status limiting activity;Impaired sensation       PT Treatment Interventions DME instruction;Gait training;Functional mobility training;Therapeutic activities;Therapeutic exercise;Balance training;Neuromuscular re-education;Patient/family education    PT Goals (Current goals can be found in the Care Plan section)  Acute Rehab PT Goals Patient Stated Goal: return home PT Goal Formulation: With patient/family Time For Goal Achievement: 04/05/22 Potential to Achieve Goals: Good    Frequency Min 3X/week     Co-evaluation               AM-PAC PT "6 Clicks" Mobility  Outcome Measure Help needed turning from your back to your side while in a flat bed without using bedrails?: None Help needed moving from lying on your back to sitting on the side of a flat bed without using bedrails?: None Help needed moving to and from a bed to a chair (including a wheelchair)?: A Little Help needed  standing up from a chair using your arms (e.g., wheelchair or bedside chair)?: A Little Help needed to walk in hospital room?: Total Help needed climbing 3-5 steps with a railing? : Total 6 Click Score: 16    End of Session Equipment Utilized During Treatment: Gait belt;Oxygen Activity Tolerance: Patient tolerated treatment well Patient left: in bed;with call bell/phone within reach;with bed alarm set;with family/visitor present Nurse Communication: Mobility status PT Visit Diagnosis: Other abnormalities of gait and mobility (R26.89);Muscle weakness (generalized) (M62.81)    Time: 2505-3976 PT Time Calculation (min) (ACUTE ONLY): 36 min   Charges:   PT Evaluation $PT Eval Low Complexity: 1 Low PT Treatments $Therapeutic Activity: 8-22 mins         Tori Zyeir Dymek PT, DPT 03/22/22, 1:55 PM

## 2022-03-22 NOTE — Progress Notes (Signed)
  Progress Note   Patient: Rebecca [name]:[id number] DOB: 1940/12/04 DOA: 03/20/2022     2 DOS: the patient was seen and examined on 03/22/2022        Brief hospital course: Rebecca [name] is an 81 y.o. F with end stage COPD, chronic respiratory failure with hypoxia and hypercapnia, sCHF EF 40-45%, and HTN who presented with progressive shortness of breath, now severe, after tapering off an extended prednisone course 2 weeks ago.  In the ER, tachypneic and tachycardic.  CXR clear.            Assessment and Plan: * Acute on chronic respiratory failure with hypoxia and hypercapnia (HCC) Chronic obstructive pulmonary disease with acute exacerbation (HCC) FEV 31% predicted in 2019 No improvement today -Continue Solu-Medrol, Brovana, bronchodilators, antibiotics, PPI - Add morphine - Add gabapentin QHS    Chronic combined systolic and diastolic CHF (congestive heart failure) (HCC) Essential hypertension Blood pressure slightly elevated No evidence of fluid overload on chest x-ray or clinical exam - Continue ARB, metoprolol, torsemide  Hypokalemia - Continue home potassium  Type 2 diabetes mellitus with complication, without long-term current use of insulin (HCC) Glucose controlled - Continue sliding scale corrections - Hold metformin   CAD (coronary artery disease) - Continue aspirin, ARB, metoprolol          Subjective: Patient feels very tired, she had no shortness of breath, no fever, no confusion.  Her breathing is no better, she feels very out of breath at rest, chest is tight, cannot get a deep breath.     Physical Exam: BP (!) 186/89 (BP Location: Left Arm)   Pulse (!) 103   Temp 98.4 F (36.9 C) (Oral)   Resp 18   Ht 5\' 2"  (1.575 m)   Wt 68.9 kg   SpO2 96%   BMI 27.80 kg/m   Elderly adult female, sitting on the edge of the bed, appears breathless with talking RRR, no murmurs, no peripheral edema Respiratory rate increased, lung sounds  tight, diminished, poor air movement Abdomen soft tenderness palpation or guarding Attention normal, affect blunted, judgment insight appear normal, face symmetric, speech fluent, severe generalized weakness  Data Reviewed: Glucose normal, No new Labs  Family Communication: Daughter at the bedside    Disposition: Status is: Inpatient         Author: , MD 03/22/2022 3:28 PM  For on call review [email or web address].

## 2022-03-23 DIAGNOSIS — D638 Anemia in other chronic diseases classified elsewhere: Secondary | ICD-10-CM | POA: Diagnosis not present

## 2022-03-23 DIAGNOSIS — I2510 Atherosclerotic heart disease of native coronary artery without angina pectoris: Secondary | ICD-10-CM | POA: Diagnosis not present

## 2022-03-23 DIAGNOSIS — I5042 Chronic combined systolic (congestive) and diastolic (congestive) heart failure: Secondary | ICD-10-CM | POA: Diagnosis not present

## 2022-03-23 DIAGNOSIS — J9621 Acute and chronic respiratory failure with hypoxia: Secondary | ICD-10-CM | POA: Diagnosis not present

## 2022-03-23 LAB — GLUCOSE, CAPILLARY
Glucose-Capillary: 129 mg/dL — ABNORMAL HIGH (ref 70–99)
Glucose-Capillary: 147 mg/dL — ABNORMAL HIGH (ref 70–99)
Glucose-Capillary: 175 mg/dL — ABNORMAL HIGH (ref 70–99)
Glucose-Capillary: 126 mg/dL — ABNORMAL HIGH (ref 70–99)

## 2022-03-23 NOTE — Progress Notes (Signed)
Physical Therapy Treatment Patient Details Name: Rebecca [name] MRN: [id number] DOB: October 27, 1940 Today's Date: 03/23/2022   History of Present Illness Pt is a 81 yo female admitted with Acute on chronic respiratory failure with hypoxia and hypercapnia. PMH: end stage COPD, chronic respiratory failure with hypoxia and hypercapnia, sCHF EF 40-45%, and HTN    PT Comments    Pt recently ambulated to the bathroom with nursing and is now short of breath and fatigued. Pt agreed to seated BUE/LE exercises. She requires frequent rest breaks 2* dyspnea. SaO2 94% on 4L O2 with activity. Pt performed sit to stand x 3 trials with RW, also with seated rest breaks between trials 2* dyspnea.    Recommendations for follow up therapy are one component of a multi-disciplinary discharge planning process, led by the attending physician.  Recommendations may be updated based on patient status, additional functional criteria and insurance authorization.  Follow Up Recommendations  Skilled nursing-short term rehab (<3 hours/day) Can patient physically be transported by private vehicle: Yes   Assistance Recommended at Discharge Frequent or constant Supervision/Assistance  Patient can return home with the following A little help with walking and/or transfers;A little help with bathing/dressing/bathroom;Assistance with cooking/housework   Equipment Recommendations  None recommended by PT    Recommendations for Other Services       Precautions / Restrictions Precautions Precautions: Fall Precaution Comments: monitor O2 Restrictions Weight Bearing Restrictions: No     Mobility  Bed Mobility Overal bed mobility: Modified Independent                  Transfers Overall transfer level: Needs assistance Equipment used: Rolling walker (2 wheels) Transfers: Sit to/from Stand Sit to Stand: Supervision Stand pivot transfers: Supervision         General transfer comment: pt powers to stand with  BUE assisting, slow movement, good safety awareness with O2 tubing management. sit to stand x 3 trials with seated rest between trials 2* dyspnea. SaO2 94% on 4L O2 with activity.    Ambulation/Gait               General Gait Details: deferred, pt recently ambulated to bathroom with nursing and is fatigued   Stairs             Wheelchair Mobility    Modified Rankin (Stroke Patients Only)       Balance Overall balance assessment: No apparent balance deficits (not formally assessed)                                          Cognition Arousal/Alertness: Awake/alert Behavior During Therapy: WFL for tasks assessed/performed Overall Cognitive Status: Within Functional Limits for tasks assessed                                          Exercises General Exercises - Upper Extremity Shoulder Flexion: AROM, Both, 10 reps, Seated General Exercises - Lower Extremity Ankle Circles/Pumps: AROM, Both, 10 reps, Supine Long Arc Quad: AROM, Both, 10 reps, Seated Hip Flexion/Marching: AROM, Both, 10 reps, Seated Mini-Sqauts: AROM, Both, 10 reps, Standing    General Comments        Pertinent Vitals/Pain Pain Assessment Pain Assessment: No/denies pain    Home Living  Prior Function            PT Goals (current goals can now be found in the care plan section) Acute Rehab PT Goals Patient Stated Goal: return home PT Goal Formulation: With patient Time For Goal Achievement: 04/05/22 Potential to Achieve Goals: Good Progress towards PT goals: Progressing toward goals    Frequency    Min 3X/week      PT Plan Current plan remains appropriate    Co-evaluation              AM-PAC PT "6 Clicks" Mobility   Outcome Measure  Help needed turning from your back to your side while in a flat bed without using bedrails?: None Help needed moving from lying on your back to sitting on the side of a  flat bed without using bedrails?: None Help needed moving to and from a bed to a chair (including a wheelchair)?: A Little Help needed standing up from a chair using your arms (e.g., wheelchair or bedside chair)?: A Little Help needed to walk in hospital room?: A Lot Help needed climbing 3-5 steps with a railing? : A Lot 6 Click Score: 18    End of Session Equipment Utilized During Treatment: Gait belt;Oxygen Activity Tolerance: Patient tolerated treatment well Patient left: in bed;with call bell/phone within reach;with bed alarm set Nurse Communication: Mobility status PT Visit Diagnosis: Other abnormalities of gait and mobility (R26.89);Muscle weakness (generalized) (M62.81)     Time: 1430-1450 PT Time Calculation (min) (ACUTE ONLY): 20 min  Charges:  $Therapeutic Exercise: 8-22 mins                    Ralene Bathe Kistler PT 03/23/2022  Acute Rehabilitation Services  Office [phone]

## 2022-03-23 NOTE — Progress Notes (Signed)
  Progress Note   Patient: Rebecca [name] JEH:[id number] DOB: 1940/10/24 DOA: 03/20/2022     3 DOS: the patient was seen and examined on 03/23/2022        Brief hospital course: Rebecca [name] is an 81 y.o. F with end stage COPD, chronic respiratory failure with hypoxia and hypercapnia, sCHF EF 40-45%, and HTN who presented with progressive shortness of breath, now severe, after tapering off an extended prednisone course 2 weeks ago.  In the ER, tachypneic and tachycardic.  CXR clear.            Assessment and Plan: *Acute on chronic respiratory failure with hypoxia and hypercapnia Chronic obstructive pulmonary disease with acute exacerbation (HCC) FEV 31% predicted in 2019 Patient with some improvement with morphine.  She remains extremely weak and debilitated, short of breath to walk more than a few steps. - Continue IV Solu-Medrol - Continue Brovana - Continue every 6 hours bronchodilators - Continue azithromycin - Continue morphine, gabapentin     Chronic combined systolic and diastolic congestive heart failure Essential hypertension CAD (coronary artery disease) Blood pressure controlled, appears euvolemic - Continue ARB, metoprolol, torsemide  Hypokalemia Resolved  Type 2 diabetes Glucose controlled -Continue sliding scale corrections - Hold metformin              Subjective: Patient is fatigued, weak.  Very limited in physical activity due to her breathing.  No sputum, hemoptysis, confusion, fever.  Had some symptomatic improvement with morphine     Physical Exam: BP (!) 143/84 (BP Location: Left Arm)   Pulse 99   Temp 98.9 F (37.2 C) (Oral)   Resp 17   Ht 5\' 2"  (1.575 m)   Wt 68.9 kg   SpO2 95%   BMI 27.80 kg/m   Elderly adult female, lying in bed, appears weak and debilitated, breathless with talking RRR, no murmurs, no peripheral edema, JVP normal Respiratory rate normal at rest, lung sounds diminished, poor air movement Attention  normal, affect blunted, judgment and insight appear normal, face symmetric, speech fluent, severe generalized weakness    Data Reviewed: Glucose normal No new labs  Family Communication: Daughters at the bedside    Disposition: Status is: Inpatient         Author: , MD 03/23/2022 3:54 PM  For on call review [email or web address].

## 2022-03-24 DIAGNOSIS — I2510 Atherosclerotic heart disease of native coronary artery without angina pectoris: Secondary | ICD-10-CM | POA: Diagnosis not present

## 2022-03-24 DIAGNOSIS — D638 Anemia in other chronic diseases classified elsewhere: Secondary | ICD-10-CM | POA: Diagnosis not present

## 2022-03-24 DIAGNOSIS — I5042 Chronic combined systolic (congestive) and diastolic (congestive) heart failure: Secondary | ICD-10-CM | POA: Diagnosis not present

## 2022-03-24 DIAGNOSIS — J9621 Acute and chronic respiratory failure with hypoxia: Secondary | ICD-10-CM | POA: Diagnosis not present

## 2022-03-24 LAB — BASIC METABOLIC PANEL
Anion gap: 8 (ref 5–15)
BUN: 26 mg/dL — ABNORMAL HIGH (ref 8–23)
CO2: 44 mmol/L — ABNORMAL HIGH (ref 22–32)
Calcium: 9.3 mg/dL (ref 8.9–10.3)
Chloride: 88 mmol/L — ABNORMAL LOW (ref 98–111)
Creatinine, Ser: 0.74 mg/dL (ref 0.44–1.00)
GFR, Estimated: >60 mL/min (ref >60)
Glucose, Bld: 115 mg/dL — ABNORMAL HIGH (ref 70–99)
Potassium: 4.4 mmol/L (ref 3.5–5.1)
Sodium: 140 mmol/L (ref 135–145)

## 2022-03-24 LAB — GLUCOSE, CAPILLARY
Glucose-Capillary: 95 mg/dL (ref 70–99)
Glucose-Capillary: 157 mg/dL — ABNORMAL HIGH (ref 70–99)
Glucose-Capillary: 153 mg/dL — ABNORMAL HIGH (ref 70–99)
Glucose-Capillary: 167 mg/dL — ABNORMAL HIGH (ref 70–99)

## 2022-03-24 LAB — CBC
HCT: 37.2 % (ref 36.0–46.0)
Hemoglobin: 11.5 g/dL — ABNORMAL LOW (ref 12.0–15.0)
MCH: 32.9 pg (ref 26.0–34.0)
MCHC: 30.9 g/dL (ref 30.0–36.0)
MCV: 106.3 fL — ABNORMAL HIGH (ref 80.0–100.0)
Platelets: 228 10*3/uL (ref 150–400)
RBC: 3.5 MIL/uL — ABNORMAL LOW (ref 3.87–5.11)
RDW: 12.5 % (ref 11.5–15.5)
WBC: 9.1 10*3/uL (ref 4.0–10.5)
nRBC: 0 % (ref 0.0–0.2)

## 2022-03-24 MED ORDER — LOSARTAN POTASSIUM 50 MG PO TABS
100.0000 mg | ORAL_TABLET | Freq: Every day | ORAL | Status: DC
  Administered 2022-03-24 – 2022-03-27 (×4): 100 mg via ORAL
  Filled 2022-03-24 (×4): qty 2

## 2022-03-24 MED ORDER — AZITHROMYCIN 250 MG PO TABS
250.0000 mg | ORAL_TABLET | Freq: Every day | ORAL | Status: AC
  Administered 2022-03-24 – 2022-03-25 (×2): 250 mg via ORAL
  Filled 2022-03-24 (×2): qty 1

## 2022-03-24 NOTE — NC FL2 (Signed)
[place] MEDICAID FL2 LEVEL OF CARE SCREENING TOOL     IDENTIFICATION  Patient Name: Rebecca [name] Birthdate: Dec 17, 1940 Sex: female Admission Date (Current Location): 03/20/2022  [facility] and [place] Number:  Producer, television/film/video and Address:  [facility],  [address] [place], [place] [address]      Provider Number: [id number]  Attending Physician Name and Address:  Alberteen Sam, *  Relative Name and Phone Number:   [name] Swing (dtr) [phone])    Current Level of Care: Hospital Recommended Level of Care: Skilled Nursing Facility Prior Approval Number:    Date Approved/Denied:   PASRR Number:  [id number])  Discharge Plan: SNF    Current Diagnoses: Patient Active Problem List   Diagnosis Date Noted   Chronic combined systolic and diastolic CHF (congestive heart failure) (HCC) 03/21/2022   Anemia, chronic disease 03/21/2022   Acute on chronic respiratory failure with hypoxia and hypercapnia (HCC) 03/20/2022   Type 2 diabetes mellitus with complication, without long-term current use of insulin (HCC) 03/20/2022   Hypokalemia 03/20/2022   Aspiration pneumonia (HCC) 07/03/2021   Macrocytic anemia 06/28/2021   Unspecified protein-calorie malnutrition (HCC) 06/28/2021   Hemoptysis 06/19/2021   Hematochezia 06/19/2021   HCAP (healthcare-associated pneumonia)    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) 06/18/2021   CAD (coronary artery disease) 06/18/2021   Essential hypertension 06/18/2021   Hyperlipidemia 06/18/2021   Left bundle branch block 06/18/2021   Dyspnea on minimal exertion 06/14/2021   Heart block AV complete (HCC) 06/14/2021   Physical deconditioning 08/03/2020   Chronic obstructive pulmonary disease with acute exacerbation (HCC)    Palliative care encounter    Goals of care, counseling/discussion    COPD exacerbation (HCC) 01/12/2020   Nonischemic cardiomyopathy (HCC) 07/02/2018   Lung nodule 06/18/2018   Chronic  respiratory failure with hypoxia and hypercapnia (HCC) 02/05/2018   COPD with chronic bronchitis and emphysema (HCC) 02/05/2018    Orientation RESPIRATION BLADDER Height & Weight     Self, Time, Situation, Place  O2 Continent Weight: 68.9 kg Height:  5\' 2"  (157.5 cm)  BEHAVIORAL SYMPTOMS/MOOD NEUROLOGICAL BOWEL NUTRITION STATUS      Continent Diet (Heart Healthy)  AMBULATORY STATUS COMMUNICATION OF NEEDS Skin   Limited Assist Verbally Normal                       Personal Care Assistance Level of Assistance  Bathing, Feeding, Dressing Bathing Assistance: Limited assistance Feeding assistance: Limited assistance Dressing Assistance: Limited assistance     Functional Limitations Info  Sight, Hearing, Speech Sight Info: Impaired (eyeglasses) Hearing Info: Adequate Speech Info: Impaired (Dentures-top/bottom)    SPECIAL CARE FACTORS FREQUENCY  PT (By licensed PT), OT (By licensed OT)     PT Frequency:  (5x week) OT Frequency:  (5x week)            Contractures Contractures Info: Not present    Additional Factors Info  Code Status, Allergies Code Status Info:  (Partial) Allergies Info:  (Ciprofloxacin, Gabapentin, Pravastatin Sodium, Rosuvastatin Calcium, Simvastatin, Amoxicillin, Lisinopril, Sulfacetamide, Tetracyclines & Related)           Current Medications (03/24/2022):  This is the current hospital active medication list Current Facility-Administered Medications  Medication Dose Route Frequency Provider Last Rate Last Admin   acetaminophen (TYLENOL) tablet 1,000 mg  1,000 mg Oral BID PRN Tu, Ching T, DO   1,000 mg at 03/22/22 0655   ALPRAZolam (XANAX) tablet 0.25 mg  0.25 mg Oral BID PRN Tu, Ching T, DO   0.25 mg at 03/22/22 0655   arformoterol (BROVANA) nebulizer solution 15 mcg  15 mcg Nebulization BID Tu, Ching T, DO   15 mcg at 03/24/22 0741   aspirin EC tablet 81 mg  81 mg Oral Daily Tu, Ching T, DO   81 mg at 03/24/22 0856   azithromycin (ZITHROMAX)  tablet 250 mg  250 mg Oral Daily Danford, Earl Lites, MD       brimonidine (ALPHAGAN) 0.2 % ophthalmic solution 1 drop  1 drop Left Eye BID Tu, Ching T, DO   1 drop at 03/24/22 0858   budesonide (PULMICORT) nebulizer solution 0.5 mg  0.5 mg Nebulization BID Tu, Ching T, DO   0.5 mg at 03/24/22 0741   calcium-vitamin D (OSCAL WITH D) 500-5 MG-MCG per tablet 1 tablet  1 tablet Oral QPM Tu, Ching T, DO   1 tablet at 03/23/22 2020   docusate sodium (COLACE) capsule 100 mg  100 mg Oral Daily Tu, Ching T, DO   100 mg at 03/24/22 0856   enoxaparin (LOVENOX) injection 40 mg  40 mg Subcutaneous Q24H Tu, Ching T, DO   40 mg at 03/23/22 2128   gabapentin (NEURONTIN) capsule 200 mg  200 mg Oral QHS Alberteen Sam, MD   200 mg at 03/23/22 2127   hydrALAZINE (APRESOLINE) tablet 25 mg  25 mg Oral Q8H PRN Alberteen Sam, MD   25 mg at 03/22/22 1309   insulin aspart (novoLOG) injection 0-9 Units  0-9 Units Subcutaneous TID PC & HS Tu, Ching T, DO   2 Units at 03/24/22 1317   ipratropium (ATROVENT) nebulizer solution 0.5 mg  0.5 mg Nebulization QID Alberteen Sam, MD   0.5 mg at 03/24/22 1146   levalbuterol (XOPENEX) nebulizer solution 0.63 mg  0.63 mg Nebulization QID Alberteen Sam, MD   0.63 mg at 03/24/22 1146   losartan (COZAAR) tablet 100 mg  100 mg Oral Daily Alberteen Sam, MD   100 mg at 03/24/22 0855   methylPREDNISolone sodium succinate (SOLU-MEDROL) 125 mg/2 mL injection 60 mg  60 mg Intravenous Daily Tu, Ching T, DO   60 mg at 03/24/22 0855   metoprolol tartrate (LOPRESSOR) tablet 12.5 mg  12.5 mg Oral BID Tu, Ching T, DO   12.5 mg at 03/24/22 0856   morphine 10 MG/5ML solution 2.5 mg  2.5 mg Oral Q2H PRN Alberteen Sam, MD   2.5 mg at 03/24/22 1152   pantoprazole (PROTONIX) EC tablet 20 mg  20 mg Oral BID Alberteen Sam, MD   20 mg at 03/24/22 0856   polyethylene glycol (MIRALAX / GLYCOLAX) packet 17 g  17 g Oral Daily PRN Foust, Katy L, NP   17  g at 03/24/22 0856   polyvinyl alcohol (LIQUIFILM TEARS) 1.4 % ophthalmic solution 1 drop  1 drop Both Eyes TID PRN Tu, Ching T, DO       potassium chloride (KLOR-CON M) CR tablet 10 mEq  10 mEq Oral BID Tu, Ching T, DO   10 mEq at 03/24/22 0856   torsemide (DEMADEX) tablet 20 mg  20 mg Oral Daily Tu, Ching T, DO   20 mg at 03/24/22 1610     Discharge Medications: Please see discharge summary for a list of discharge medications.  Relevant Imaging Results:  Relevant Lab Results:   Additional Information  (SS# [id number])  Chrstopher Malenfant, Olegario Messier, RN

## 2022-03-24 NOTE — TOC Initial Note (Addendum)
Transition of Care [facility]) - Initial/Assessment Note    Patient Details  Name: Rebecca [name] MRN: [id number] Date of Birth: December 13, 1940  Transition of Care [facility]) CM/SW Contact:    Lanier Clam, RN Phone Number: 03/24/2022, 3:23 PM  Clinical Narrative: PT recc SNF-spoke to dtr [name] agree to SNF-faxed out await bed offers, choice,prior auth. Active w/otpt [facility] for palliative care services.                 Expected Discharge Plan: Skilled Nursing Facility Barriers to Discharge: Continued Medical Work up   Patient Goals and CMS Choice Patient states their goals for this hospitalization and ongoing recovery are::  (Rehab) CMS Medicare.gov Compare Post Acute Care list provided to:: Patient Represenative (must comment) [name] (dtr)) Choice offered to / list presented to : Adult Children  Expected Discharge Plan and Services Expected Discharge Plan: Skilled Nursing Facility   Discharge Planning Services: CM Consult Post Acute Care Choice: Skilled Nursing Facility Living arrangements for the past 2 months: Single Family Home, Independent Living Facility                                      Prior Living Arrangements/Services Living arrangements for the past 2 months: Single Family Home, Independent Living Facility Lives with:: Self Patient language and need for interpreter reviewed:: Yes Do you feel safe going back to the place where you live?: Yes        Care giver support system in place?: Yes (comment) Current home services: DME (cane,rw,w/c, rollator;02.) Criminal Activity/Legal Involvement Pertinent to Current Situation/Hospitalization: No - Comment as needed  Activities of Daily Living Home Assistive Devices/Equipment: Eyeglasses, Oxygen, Shower chair with back, Environmental consultant (specify type) (rollator) ADL Screening (condition at time of admission) Patient's cognitive ability adequate to safely complete daily activities?: Yes Is the patient deaf or have difficulty  hearing?: Yes Does the patient have difficulty seeing, even when wearing glasses/contacts?: No Does the patient have difficulty concentrating, remembering, or making decisions?: No Patient able to express need for assistance with ADLs?: Yes Does the patient have difficulty dressing or bathing?: No Independently performs ADLs?: Yes (appropriate for developmental age) Does the patient have difficulty walking or climbing stairs?: Yes Weakness of Legs: None Weakness of Arms/Hands: None  Permission Sought/Granted Permission sought to share information with : Case Manager Permission granted to share information with : Yes, Verbal Permission Granted  Share Information with NAME:  (Case Manager)           Emotional Assessment Appearance:: Appears stated age Attitude/Demeanor/Rapport: Gracious Affect (typically observed): Accepting Orientation: : Oriented to Self, Oriented to Place, Oriented to  Time, Oriented to Situation Alcohol / Substance Use: Not Applicable Psych Involvement: No (comment)  Admission diagnosis:  COPD exacerbation (HCC) [J44.1] Acute hypercapnic respiratory failure (HCC) [J96.02] Acute on chronic respiratory failure with hypoxia and hypercapnia (HCC) [A25.05, J96.22] Patient Active Problem List   Diagnosis Date Noted   Chronic combined systolic and diastolic CHF (congestive heart failure) (HCC) 03/21/2022   Anemia, chronic disease 03/21/2022   Acute on chronic respiratory failure with hypoxia and hypercapnia (HCC) 03/20/2022   Type 2 diabetes mellitus with complication, without long-term current use of insulin (HCC) 03/20/2022   Hypokalemia 03/20/2022   Aspiration pneumonia (HCC) 07/03/2021   Macrocytic anemia 06/28/2021   Unspecified protein-calorie malnutrition (HCC) 06/28/2021   Hemoptysis 06/19/2021   Hematochezia 06/19/2021   HCAP (healthcare-associated pneumonia)  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) 06/18/2021   CAD (coronary artery disease)  06/18/2021   Essential hypertension 06/18/2021   Hyperlipidemia 06/18/2021   Left bundle branch block 06/18/2021   Dyspnea on minimal exertion 06/14/2021   Heart block AV complete (HCC) 06/14/2021   Physical deconditioning 08/03/2020   Chronic obstructive pulmonary disease with acute exacerbation [facility])    Palliative care encounter    Goals of care, counseling/discussion    COPD exacerbation (HCC) 01/12/2020   Nonischemic cardiomyopathy (HCC) 07/02/2018   Lung nodule 06/18/2018   Chronic respiratory failure with hypoxia and hypercapnia (HCC) 02/05/2018   COPD with chronic bronchitis and emphysema (HCC) 02/05/2018   PCP:  Farris Has, MD Pharmacy:   [facility] [phone] G[place] Otto, [place] - [address] [place] [address] Phone: [phone] Fax: [phone]  [facility] - [place], [place] - [address] [place] [address] Phone: [phone] Fax: [phone]  [facility] - [place], [place] - [address] [employer] [place] [address] Phone: [phone] Fax: [phone]  [facility] at [facility] [address] [place] [address] Phone: [phone] Fax: [phone]     Social Determinants of Health (SDOH) Interventions    Readmission Risk Interventions    03/24/2022    3:21 PM  Readmission Risk Prevention Plan  Transportation Screening Complete  PCP or Specialist Appt within 5-7 Days Complete  Home Care Screening Complete  Medication Review (RN CM) Complete

## 2022-03-24 NOTE — Progress Notes (Signed)
  Progress Note   Patient: Rebecca [name] MVE:[id number] DOB: [birth year] DOA: 03/20/2022     4 DOS: the patient was seen and examined on 03/24/2022        Brief hospital course: Rebecca [name] is an 81 y.o. F with end stage COPD, chronic respiratory failure with hypoxia and hypercapnia, sCHF EF 40-45%, and HTN who presented with progressive shortness of breath, now severe, after tapering off an extended prednisone course 2 weeks ago.  In the ER, tachypneic and tachycardic.  CXR clear.            Assessment and Plan: *Respiratory failure, end-stage COPD Patient's had modest improvement with steroids, more improvement from morphine - Continue steroids, Brovana, bronchodilators - Continue azithromycin - Continue gabapentin, morphine     Chronic systolic and diastolic congestive heart failure Essential hypertension CAD (coronary artery disease) Blood pressure slightly elevated, appears euvolemic -Continue torsemide, metoprolol -Continue losartan, increase dose    Type 2 diabetes Glucose normal -Continue sliding scale corrections - Hold metformin              Subjective: Patient feeling some improvement with morphine, she is slightly more able to get up around today, and feeling overall improvement.  No fever, sputum, hemoptysis, confusion.     Physical Exam: BP (!) 148/68 (BP Location: Left Arm)   Pulse 81   Temp 98.7 F (37.1 C) (Oral)   Resp 18   Ht 5\' 2"  (1.575 m)   Wt 68.9 kg   SpO2 95%   BMI 27.80 kg/m   Elderly adult female, sitting in recliner, eating lunch RRR, no murmurs, no peripheral edema Respiratory rate normal at rest, lung sounds still very diminished, slightly improved from yesterday due to posture No wheezing or rales appreciated, mostly because of poor air movement Attention normal, affect normal, judgment insight appear normal, face symmetric, speech fluent, generalized weakness      Data Reviewed: Glucose normal Basic  metabolic panel shows normal sodium, potassium, creatinine Bicarb 44 CBC unremarkable  Family Communication:  none present today    Disposition: Status is: Inpatient         Author: , MD 03/24/2022 3:16 PM  For on call review [email or web address]/24/[email or web address].

## 2022-03-25 DIAGNOSIS — I2510 Atherosclerotic heart disease of native coronary artery without angina pectoris: Secondary | ICD-10-CM | POA: Diagnosis not present

## 2022-03-25 DIAGNOSIS — J9621 Acute and chronic respiratory failure with hypoxia: Secondary | ICD-10-CM | POA: Diagnosis not present

## 2022-03-25 DIAGNOSIS — D638 Anemia in other chronic diseases classified elsewhere: Secondary | ICD-10-CM | POA: Diagnosis not present

## 2022-03-25 DIAGNOSIS — I5042 Chronic combined systolic (congestive) and diastolic (congestive) heart failure: Secondary | ICD-10-CM | POA: Diagnosis not present

## 2022-03-25 LAB — GLUCOSE, CAPILLARY
Glucose-Capillary: 92 mg/dL (ref 70–99)
Glucose-Capillary: 148 mg/dL — ABNORMAL HIGH (ref 70–99)
Glucose-Capillary: 191 mg/dL — ABNORMAL HIGH (ref 70–99)
Glucose-Capillary: 148 mg/dL — ABNORMAL HIGH (ref 70–99)

## 2022-03-25 MED ORDER — LEVALBUTEROL HCL 0.63 MG/3ML IN NEBU
0.6300 mg | INHALATION_SOLUTION | Freq: Four times a day (QID) | RESPIRATORY_TRACT | Status: DC
  Administered 2022-03-25 – 2022-03-27 (×9): 0.63 mg via RESPIRATORY_TRACT
  Filled 2022-03-25 (×8): qty 3

## 2022-03-25 MED ORDER — IPRATROPIUM BROMIDE 0.02 % IN SOLN
0.5000 mg | Freq: Four times a day (QID) | RESPIRATORY_TRACT | Status: DC
  Administered 2022-03-25 – 2022-03-27 (×9): 0.5 mg via RESPIRATORY_TRACT
  Filled 2022-03-25 (×8): qty 2.5

## 2022-03-25 MED ORDER — LEVALBUTEROL HCL 0.63 MG/3ML IN NEBU
0.6300 mg | INHALATION_SOLUTION | Freq: Three times a day (TID) | RESPIRATORY_TRACT | Status: DC
Start: 2022-03-25 — End: 2022-03-25
  Filled 2022-03-25: qty 3

## 2022-03-25 MED ORDER — IPRATROPIUM BROMIDE 0.02 % IN SOLN
0.5000 mg | Freq: Three times a day (TID) | RESPIRATORY_TRACT | Status: DC
  Filled 2022-03-25: qty 2.5

## 2022-03-25 NOTE — TOC Progression Note (Signed)
Transition of Care [facility]) - Progression Note    Patient Details  Name: Rebecca [name] MRN: [id number] Date of Birth: 05/16/1941  Transition of Care [facility]) CM/SW Contact  Tonie Vizcarrondo, Olegario Messier, RN Phone Number: 03/25/2022, 4:20 PM  Clinical Narrative: Georgena Spurling for [employer] awaiting auth for [employer].      Expected Discharge Plan: Skilled Nursing Facility Barriers to Discharge: Insurance Authorization  Expected Discharge Plan and Services Expected Discharge Plan: Skilled Nursing Facility   Discharge Planning Services: CM Consult Post Acute Care Choice: Skilled Nursing Facility Living arrangements for the past 2 months: Single Family Home, Independent Living Facility                                       Social Determinants of Health (SDOH) Interventions    Readmission Risk Interventions    03/24/2022    3:21 PM  Readmission Risk Prevention Plan  Transportation Screening Complete  PCP or Specialist Appt within 5-7 Days Complete  Home Care Screening Complete  Medication Review (RN CM) Complete

## 2022-03-25 NOTE — Progress Notes (Signed)
Physical Therapy Treatment Patient Details Name: Rebecca [name] MRN: [id number] DOB: Mar 05, 1941 Today's Date: 03/25/2022   History of Present Illness Pt is a 81 yo female admitted with Acute on chronic respiratory failure with hypoxia and hypercapnia. PMH: end stage COPD, chronic respiratory failure with hypoxia and hypercapnia, sCHF EF 40-45%, and HTN    PT Comments    Pt eager to ambulate on arrival to room however pt had just ambulated to bathroom and standing in room looking at food menu.  Pt encouraged to take brief seated rest break.  Pt provided with rollator per request and ambulated in hallway distance to tolerance.    Recommendations for follow up therapy are one component of a multi-disciplinary discharge planning process, led by the attending physician.  Recommendations may be updated based on patient status, additional functional criteria and insurance authorization.  Follow Up Recommendations  Skilled nursing-short term rehab (<3 hours/day) Can patient physically be transported by private vehicle: Yes   Assistance Recommended at Discharge Frequent or constant Supervision/Assistance  Patient can return home with the following A little help with walking and/or transfers;A little help with bathing/dressing/bathroom;Assistance with cooking/housework   Equipment Recommendations  None recommended by PT    Recommendations for Other Services       Precautions / Restrictions Precautions Precautions: Fall Precaution Comments: monitor O2     Mobility  Bed Mobility               General bed mobility comments: pt standing in room on arrival, reports she just ambulated to bathroom, encouraged pt to take a seated rest break prior to ambulating in hallway (pt also requesting rollator)    Transfers Overall transfer level: Needs assistance Equipment used: Rolling walker (2 wheels) Transfers: Sit to/from Stand Sit to Stand: Supervision           General transfer  comment: supervision for safety    Ambulation/Gait Ambulation/Gait assistance: Min guard Gait Distance (Feet): 80 Feet Assistive device: Rollator (4 wheels) Gait Pattern/deviations: Step-through pattern, Decreased stride length Gait velocity: decreased     General Gait Details: pt on 5L O2 [place] in room and requested to try ambulating on 4L; SpO2 down to 80% on 4L O2 [place] upon returning to room however improved to lower 90s with seated rest break   Stairs             Wheelchair Mobility    Modified Rankin (Stroke Patients Only)       Balance                                            Cognition Arousal/Alertness: Awake/alert Behavior During Therapy: WFL for tasks assessed/performed Overall Cognitive Status: Within Functional Limits for tasks assessed                                          Exercises      General Comments        Pertinent Vitals/Pain Pain Assessment Pain Assessment: No/denies pain    Home Living                          Prior Function            PT Goals (current goals can  now be found in the care plan section) Progress towards PT goals: Progressing toward goals    Frequency    Min 3X/week      PT Plan Current plan remains appropriate    Co-evaluation              AM-PAC PT "6 Clicks" Mobility   Outcome Measure  Help needed turning from your back to your side while in a flat bed without using bedrails?: None Help needed moving from lying on your back to sitting on the side of a flat bed without using bedrails?: None Help needed moving to and from a bed to a chair (including a wheelchair)?: A Little Help needed standing up from a chair using your arms (e.g., wheelchair or bedside chair)?: A Little Help needed to walk in hospital room?: A Little Help needed climbing 3-5 steps with a railing? : A Lot 6 Click Score: 19    End of Session Equipment Utilized During Treatment:  Gait belt;Oxygen Activity Tolerance: Patient tolerated treatment well Patient left: in chair;with call bell/phone within reach Nurse Communication: Mobility status PT Visit Diagnosis: Other abnormalities of gait and mobility (R26.89);Muscle weakness (generalized) (M62.81)     Time: 4193-7902 PT Time Calculation (min) (ACUTE ONLY): 17 min  Charges:  $Gait Training: 8-22 mins                     [name] PT, DPT Physical Therapist Acute Rehabilitation Services Preferred contact method: Secure Chat Weekend Pager Only: [phone] Office: [phone]    Rebecca [name] 03/25/2022, 3:22 PM

## 2022-03-25 NOTE — Progress Notes (Signed)
  Progress Note   Patient: Rebecca [name] OIZ:[id number] DOB: 1940/09/20 DOA: 03/20/2022     5 DOS: the patient was seen and examined on 03/25/2022        Brief hospital course: Mrs. Rebecca [name] is an 81 y.o. F with end stage COPD, chronic respiratory failure with hypoxia and hypercapnia, sCHF EF 40-45%, and HTN who presented with progressive shortness of breath, now severe, after tapering off an extended prednisone course 2 weeks ago.  In the ER, tachypneic and tachycardic.  CXR clear.            Assessment and Plan: *Respiratory failure, end-stage COPD Patient's had modest improvement with steroids, more improvement from morphine - Continue steroids, Brovana, bronchodilators - Continue azithromycin - Continue gabapentin, morphine     Chronic systolic and diastolic congestive heart failure Essential hypertension CAD (coronary artery disease) Blood pressure slightly elevated, appears euvolemic -Continue torsemide, metoprolol -Continue losartan, increase dose    Type 2 diabetes Glucose normal -Continue sliding scale corrections - Hold metformin              Subjective: Patient feeling improvement with morphine, no fever, confusion, cough, respiratory distress.     Physical Exam: BP 135/63 (BP Location: Left Arm)   Pulse 74   Temp 98.5 F (36.9 C) (Oral)   Resp 16   Ht 5\' 2"  (1.575 m)   Wt 68.9 kg   SpO2 98%   BMI 27.80 kg/m   Elderly adult female, sitting in recliner, eating lunch RRR, no murmurs, no peripheral edema Respiratory rate normal at rest, lung sounds still very diminished, slightly improved from yesterday due to posture No wheezing or rales appreciated, mostly because of poor air movement Attention normal, affect normal, judgment insight appear normal, face symmetric, speech fluent, generalized weakness      Data Reviewed: Glucose normal, no new labs  Family Communication: Daughter by phone    Disposition: Status is:  Inpatient         Author: , MD 03/25/2022 6:38 PM  For on call review [email or web address]/25/2022.

## 2022-03-25 NOTE — TOC Progression Note (Addendum)
Transition of Care [facility]) - Progression Note    Patient Details  Name: Rebecca [name] MRN: [id number] Date of Birth: [birth year]  Transition of Care [facility]) CM/SW Contact  Caspar Favila, Olegario Messier, RN Phone Number: 03/25/2022, 11:51 AM  Clinical Narrative: Spoke to dtr Carol-bed offers given to patient-await choice,prior auth.    1. 1.5 mi [facility] [address] [place], [place] [address] [phone] Overall rating Much below average 2. 1.9 mi [facility] [address] [place], [place] [address] [phone] Overall rating Above average 3. 2 mi [facility] [address] [place], [place] [address] [phone] Overall rating Much below average 4. 2.4 mi [facility] at the [facility] [address] [place], [place] [address] [phone] Overall rating Above average 5. 2.6 mi [facility] [address] [place], [place] [address] [phone] Overall rating Much below average 6. 3.4 mi [facility] [address] [place], [place] [address] [phone] Overall rating Much below average 7. 3.7 mi [facility] [employer] [address] [place], [place] [address] [phone] Overall rating Much above average 8. 3.9 mi [facility] [address] [place], [place] [address] [phone] Overall rating Above average 9. 4 mi [facility] [address] [place], [place] [address] [phone] Overall rating Average 10. 4.2 mi [facility] [address] [place], [place] [address] [phone] Overall rating Much below average 11. 4.6 mi [facility] [address] [place], [place] [address] [phone] Overall rating Much below  average 12. 6.3 mi [facility] [address] [place], [place] [address] [phone] Overall rating Above average 13. 9.2 mi [facility] [address] [place], [place] [address] [phone] Overall rating Above average 14. 9.6 mi [facility] [address] [place], [place] [address] [phone] Overall rating Much above average 15. 9.8 mi [facility] [address] [place], [place] [address] [phone] Overall rating Above average 16. 9.9 mi [facility] [address] [place], [place] [address] [phone] Overall rating Much above average 17. 10.7 mi [facility] [place] [address] [place], [place] [address] [phone] Overall rating Much above average 18. 13.3 [facility] [address] [place], [place] [address] [phone] Overall rating Much below average 19. 13.6 mi [facility] [address] [place], [place] [address] [phone] Overall rating Much below average 20. 14.2 mi [facility] [address] [place], [place] [address] [phone] Overall rating Much above average 21. 14.4 mi [facility] [address] [place], [place] [address] [phone] Overall rating Much below average 22. 15 mi [facility] [name] [place], [place] [address] [phone] Overall rating Much above average 23. 15.2 mi [facility] [address] [place], [place] [address] [phone] Overall rating Average 24. 15.3 mi [facility] [employer] [facility] [address] [place], [place] [address] [phone] Overall rating Average 25. 16.9 mi [facility] [address] [place], [place] [address] [phone] Overall rating Above  average 26. 18.1 mi [facility] [facility] [place] [address] [place], [place] [address] [phone] Overall rating Above average 27. 18.9 mi [facility] [address] [place], [place] [address] [phone] Overall rating Much below average 28. 19.7 mi [facility] [address] [place], [place] [address] [phone] Overall rating Below average 29. 20.2 mi [address] at the [facility] [place], [place] [address] [phone] Overall rating Much above average 30. 20.4 mi [facility] [address] [place], [place] [address] [phone] Overall rating Much below average 31. 20.4 mi [facility] [address] [place], [place] [address] [phone] Overall rating Much below average 32. 20.6 [facility] [address] [place], [place] [address] [phone] Overall rating Much above average 33. 21.2 [address] [place], [place] [address] [phone] Overall rating Below average 34. 22.7 mi [facility] [address] [place], [place] [address] [phone] Overall rating Below average 35. 22.8 mi [employer] [address] [place], [place] [address] [phone] Overall rating Much above average 36. 23.1 mi [facility] and [facility] [address] [place], [place] [address] [phone] Overall rating Average 37. 23.5 mi [facility] [address] [place], [place] [address] [phone] Overall rating Above average 38. 23.7 [facility] [address] [place], [place] [address] [phone] Overall rating Not available18 39. 24 mi [facility] [address] [place], [place] [address] [phone] Overall  rating Much below average 40. 24.8 mi [facility] [employer] [facility] [address] [place], [place] [address] [phone] Overall rating Much above average To explore and download nursing home data,visit the data catalog on CMS.gov Consumer alert Nursing homes that have been cited for potential issues related to abuse have the following icon next to their name:   More information about this, and what to do if you suspect abuse has occurred can be found here  About Medicare Nondiscrimination/AccessibilityPrivacy PolicyPrivacy SettingLinking PolicyUsing this sitePlain Writing Department of Health and Human Services A federal government website managed and paid for by the U.[place]. Centers for [employer] and [employer]. [address] [place], [place], [place] [address]   Expected Discharge Plan: Skilled Nursing Facility Barriers to Discharge: Continued Medical Work up  Expected Discharge Plan and Services Expected Discharge Plan: Skilled Nursing Facility   Discharge Planning Services: CM Consult Post Acute Care Choice: Skilled Nursing Facility Living arrangements for the past 2 months: Single Family Home, Independent Living Facility                                       Social Determinants of Health (SDOH) Interventions    Readmission Risk Interventions    03/24/2022    3:21 PM  Readmission Risk Prevention Plan  Transportation Screening Complete  PCP or Specialist Appt within 5-7 Days Complete  Home Care Screening Complete  Medication Review (RN CM) Complete

## 2022-03-25 NOTE — Progress Notes (Signed)
Mobility Specialist - Progress Note   Pre-mobility: 88 bpm HR, 97% SpO2  Post-mobility: 95 bpm HR, 90%SPO2   03/25/22 1445  Oxygen Therapy  O2 Device Nasal Cannula  O2 Flow Rate (L/min) 4 L/min  Mobility  Activity Ambulated with assistance to bathroom;Ambulated with assistance in hallway  Range of Motion/Exercises Active  Level of Assistance Standby assist, set-up cues, supervision of patient - no hands on  Assistive Device  (rollator)  Distance Ambulated (ft) 180 ft  Activity Response Tolerated well  $Mobility charge 1 Mobility   Pt was found in recliner chair and was assisted to the bathroom. Afterwards while ambulating Pt did state feeling SOB but could not retreive an accurate SpO2 during ambulation. Upon returning to room was able to get a reading of 90% on 4L and upon increasing O2 to 5L she was able to get up to 95%. Pt was left in bed with all necessities in reach.  Billey Chang Mobility Specialist

## 2022-03-26 DIAGNOSIS — I2510 Atherosclerotic heart disease of native coronary artery without angina pectoris: Secondary | ICD-10-CM | POA: Diagnosis not present

## 2022-03-26 DIAGNOSIS — J9621 Acute and chronic respiratory failure with hypoxia: Secondary | ICD-10-CM | POA: Diagnosis not present

## 2022-03-26 DIAGNOSIS — D638 Anemia in other chronic diseases classified elsewhere: Secondary | ICD-10-CM | POA: Diagnosis not present

## 2022-03-26 DIAGNOSIS — I5042 Chronic combined systolic (congestive) and diastolic (congestive) heart failure: Secondary | ICD-10-CM | POA: Diagnosis not present

## 2022-03-26 LAB — GLUCOSE, CAPILLARY
Glucose-Capillary: 107 mg/dL — ABNORMAL HIGH (ref 70–99)
Glucose-Capillary: 147 mg/dL — ABNORMAL HIGH (ref 70–99)
Glucose-Capillary: 218 mg/dL — ABNORMAL HIGH (ref 70–99)
Glucose-Capillary: 161 mg/dL — ABNORMAL HIGH (ref 70–99)

## 2022-03-26 MED ORDER — PREDNISONE 20 MG PO TABS
40.0000 mg | ORAL_TABLET | Freq: Every day | ORAL | Status: DC
  Administered 2022-03-26 – 2022-03-27 (×2): 40 mg via ORAL
  Filled 2022-03-26 (×2): qty 2

## 2022-03-26 NOTE — Progress Notes (Signed)
Mobility Specialist - Progress Note  Pre-mobility: 94 bpm HR, 86% SpO2 During mobility: 95 bpm HR, 87% SpO2 Post-mobility: 91 bpm HR, 90% SPO2 (on 5L)   03/26/22 1053  Oxygen Therapy  O2 Device Nasal Cannula  O2 Flow Rate (L/min) 4 L/min  Mobility  Activity Ambulated with assistance in hallway  Range of Motion/Exercises Active  Level of Assistance Standby assist, set-up cues, supervision of patient - no hands on  Assistive Device  (rollator)  Distance Ambulated (ft) 190 ft  Activity Response Tolerated well  $Mobility charge 1 Mobility   Pt was found in recliner chair and agreeable to mobilize. Stated feeling tired and SOB towards the EOS. Was left in recliner chair with all necessities in reach.  Billey Chang Mobility Specialist

## 2022-03-26 NOTE — TOC Progression Note (Signed)
Transition of Care [facility]) - Progression Note    Patient Details  Name: Rebecca [name] MRN: [id number] Date of Birth: [birth year]  Transition of Care [facility]) CM/SW Contact  Adrian Prows, RN Phone Number: 03/26/2022, 11:05 AM  Clinical Narrative:  received Berkley Harvey; plan # [id number], Navi ID [phone], end date 03/27/22, bed available at [facility]; contacted [place] at [facility]; she states the bed will be available 03/27/22; pt's dtr Elder Negus notified; pt will transported by [facility] once room #, and # for nursing report given tommorrow   Expected Discharge Plan: Skilled Nursing Facility Barriers to Discharge: Insurance Authorization  Expected Discharge Plan and Services Expected Discharge Plan: Skilled Nursing Facility   Discharge Planning Services: CM Consult Post Acute Care Choice: Skilled Nursing Facility Living arrangements for the past 2 months: Single Family Home, Independent Living Facility                                       Social Determinants of Health (SDOH) Interventions    Readmission Risk Interventions    03/24/2022    3:21 PM  Readmission Risk Prevention Plan  Transportation Screening Complete  PCP or Specialist Appt within 5-7 Days Complete  Home Care Screening Complete  Medication Review (RN CM) Complete

## 2022-03-26 NOTE — Care Management Important Message (Signed)
Important Message  Patient Details IM Letter placed in Patients room. Name: Rebecca [name] MRN: [id number] Date of Birth: 06-04-1941   Medicare Important Message Given:  Yes     Caren Macadam 03/26/2022, 12:05 PM

## 2022-03-26 NOTE — TOC Progression Note (Deleted)
Transition of Care [facility]) - Progression Note    Patient Details  Name: NEDDIE STEEDMAN MRN: [id number] Date of Birth: Nov 11, 1940  Transition of Care [facility] CM/SW Contact  Adrian Prows, RN Phone Number: 03/26/2022, 11:16 AM  Clinical Narrative:  received auth; plan # [id number], Navi ID [phone], end date 03/27/22, bed available at [facility]; contacted [place] at [facility]; she states the bed will be available 03/27/22; pt's dtr Elder Negus notified; pt will transported by [facility] once room #, and # for nursing report given tommorrow   Expected Discharge Plan: Skilled Nursing Facility Barriers to Discharge: Insurance Authorization  Expected Discharge Plan and Services Expected Discharge Plan: Skilled Nursing Facility   Discharge Planning Services: CM Consult Post Acute Care Choice: Skilled Nursing Facility Living arrangements for the past 2 months: Single Family Home, Independent Living Facility                                       Social Determinants of Health (SDOH) Interventions    Readmission Risk Interventions    03/24/2022    3:21 PM  Readmission Risk Prevention Plan  Transportation Screening Complete  PCP or Specialist Appt within 5-7 Days Complete  Home Care Screening Complete  Medication Review (RN CM) Complete

## 2022-03-26 NOTE — Progress Notes (Signed)
  Progress Note   Patient: Rebecca [name] ZOX:[id number] DOB: 07-21-1940 DOA: 03/20/2022     6 DOS: the patient was seen and examined on 03/26/2022 at 1:36PM      Brief hospital course: Mrs. Giraldo is an 81 y.o. F with end stage COPD, chronic respiratory failure with hypoxia and hypercapnia, sCHF EF 40-45%, and HTN who presented with progressive shortness of breath, now severe, after tapering off an extended prednisone course 2 weeks ago.  In the ER, tachypneic and tachycardic.  CXR clear.   Admitted on IV steroids, bronchodilators      Assessment and Plan: * Acute on chronic respiratory failure with hypoxia and hypercapnia (HCC) Patient with severe end-stage COPD now progressing.  Probably some degree of flare now, as symptoms started/got worse after tapering off a long prednisone taper.    Chest x-ray clear, no fever or chills.  Tight and wheezing on exam.      At baseline is on 5 to 6 L, but at presentation, she had PCO2 90, mild acidosis, respiratory distress, and required BiPAP.     Chronic obstructive pulmonary disease with acute exacerbation (HCC) FEV 31% predicted in 2019.    Patient started on steroids, and had slow improvement.  She started on morphine, and this provided excellent symptomatic improvement. Completed 5 days azithromycin.  - Continue steroids, transition to prednisone and plan for taper at discharge - Continue Brovana, bronchodilators, PPI - Continue PRN morphine - Stop gabapentin (patient had volunteered this as an option for dyspnea, it was trialed and ineffective, I am not familiar with this indication anyway)     Anemia, chronic disease Hemoglobin stable relative to baseline, no clinical bleeding  Chronic combined systolic and diastolic CHF (congestive heart failure) (HCC) No evidence of fluid overload on chest x-ray or clinical exam - Continue ARB, metoprolol, torsemide  Hypokalemia - Continue home potassium  Type 2 diabetes mellitus with  complication, without long-term current use of insulin (HCC) Glucose controlled - Continue sliding scale corrections - Hold metformin  Essential hypertension Blood pressure slightly elevated - Continue losartan, metoprolol, torsemide  CAD (coronary artery disease) - Continue aspirin, ARB, metoprolol          Subjective: No fever, cough, sputum, confusion.  Dyspnea is at baseline.     Physical Exam: BP 103/65 (BP Location: Left Arm)   Pulse 87   Temp 98.5 F (36.9 C) (Oral)   Resp 20   Ht 5\' 2"  (1.575 m)   Wt 68.9 kg   SpO2 93%   BMI 27.80 kg/m   Thin elderly female, sitting up in recliner, no acute distress RRR, no murmurs, no peripheral edema Respiratory rate seems normal, lung sounds distant, respirations diminished bilaterally, no wheezing, but very diminished overall and poor air movement Abdomen soft without tenderness palpation Attention normal, affect appropriate, judgment and insight appear normal  Data Reviewed: Glucose normal  Family Communication: Daughter by phone    Disposition: Status is: Inpatient The patient was admitted with respiratory acidosis, some degree of flare after stopping prednisone.  She was started on high-dose steroids, minimal improvement.  Started oral morphine 2.5 pRN and she felt much better  Needs close Pulm follow up at discharge and consideration of chronic prednisone          Author: , MD 03/26/2022 7:08 PM  For on call review [email or web address].

## 2022-03-27 DIAGNOSIS — Z515 Encounter for palliative care: Secondary | ICD-10-CM | POA: Diagnosis not present

## 2022-03-27 DIAGNOSIS — M6281 Muscle weakness (generalized): Secondary | ICD-10-CM | POA: Diagnosis not present

## 2022-03-27 DIAGNOSIS — I5042 Chronic combined systolic (congestive) and diastolic (congestive) heart failure: Secondary | ICD-10-CM | POA: Diagnosis not present

## 2022-03-27 DIAGNOSIS — J9600 Acute respiratory failure, unspecified whether with hypoxia or hypercapnia: Secondary | ICD-10-CM | POA: Diagnosis not present

## 2022-03-27 DIAGNOSIS — I5020 Unspecified systolic (congestive) heart failure: Secondary | ICD-10-CM | POA: Diagnosis not present

## 2022-03-27 DIAGNOSIS — E876 Hypokalemia: Secondary | ICD-10-CM | POA: Diagnosis not present

## 2022-03-27 DIAGNOSIS — E119 Type 2 diabetes mellitus without complications: Secondary | ICD-10-CM | POA: Diagnosis not present

## 2022-03-27 DIAGNOSIS — R2681 Unsteadiness on feet: Secondary | ICD-10-CM | POA: Diagnosis not present

## 2022-03-27 DIAGNOSIS — I10 Essential (primary) hypertension: Secondary | ICD-10-CM | POA: Diagnosis not present

## 2022-03-27 DIAGNOSIS — J449 Chronic obstructive pulmonary disease, unspecified: Secondary | ICD-10-CM | POA: Diagnosis not present

## 2022-03-27 DIAGNOSIS — R1312 Dysphagia, oropharyngeal phase: Secondary | ICD-10-CM | POA: Diagnosis not present

## 2022-03-27 DIAGNOSIS — R2689 Other abnormalities of gait and mobility: Secondary | ICD-10-CM | POA: Diagnosis not present

## 2022-03-27 DIAGNOSIS — R41841 Cognitive communication deficit: Secondary | ICD-10-CM | POA: Diagnosis not present

## 2022-03-27 DIAGNOSIS — Z23 Encounter for immunization: Secondary | ICD-10-CM | POA: Diagnosis not present

## 2022-03-27 DIAGNOSIS — J9621 Acute and chronic respiratory failure with hypoxia: Secondary | ICD-10-CM | POA: Diagnosis not present

## 2022-03-27 DIAGNOSIS — R0689 Other abnormalities of breathing: Secondary | ICD-10-CM | POA: Diagnosis not present

## 2022-03-27 DIAGNOSIS — J9611 Chronic respiratory failure with hypoxia: Secondary | ICD-10-CM | POA: Diagnosis not present

## 2022-03-27 DIAGNOSIS — I2510 Atherosclerotic heart disease of native coronary artery without angina pectoris: Secondary | ICD-10-CM | POA: Diagnosis not present

## 2022-03-27 DIAGNOSIS — R0902 Hypoxemia: Secondary | ICD-10-CM | POA: Diagnosis not present

## 2022-03-27 DIAGNOSIS — D638 Anemia in other chronic diseases classified elsewhere: Secondary | ICD-10-CM | POA: Diagnosis not present

## 2022-03-27 DIAGNOSIS — J9612 Chronic respiratory failure with hypercapnia: Secondary | ICD-10-CM | POA: Diagnosis not present

## 2022-03-27 DIAGNOSIS — E118 Type 2 diabetes mellitus with unspecified complications: Secondary | ICD-10-CM | POA: Diagnosis not present

## 2022-03-27 DIAGNOSIS — Z7401 Bed confinement status: Secondary | ICD-10-CM | POA: Diagnosis not present

## 2022-03-27 DIAGNOSIS — J441 Chronic obstructive pulmonary disease with (acute) exacerbation: Secondary | ICD-10-CM | POA: Diagnosis not present

## 2022-03-27 DIAGNOSIS — J9622 Acute and chronic respiratory failure with hypercapnia: Secondary | ICD-10-CM | POA: Diagnosis not present

## 2022-03-27 DIAGNOSIS — E785 Hyperlipidemia, unspecified: Secondary | ICD-10-CM | POA: Diagnosis not present

## 2022-03-27 DIAGNOSIS — R0602 Shortness of breath: Secondary | ICD-10-CM | POA: Diagnosis not present

## 2022-03-27 DIAGNOSIS — R413 Other amnesia: Secondary | ICD-10-CM | POA: Diagnosis not present

## 2022-03-27 LAB — GLUCOSE, CAPILLARY
Glucose-Capillary: 106 mg/dL — ABNORMAL HIGH (ref 70–99)
Glucose-Capillary: 123 mg/dL — ABNORMAL HIGH (ref 70–99)

## 2022-03-27 MED ORDER — PREDNISONE 20 MG PO TABS: 40.0000 mg | ORAL_TABLET | Freq: Every day | ORAL | Status: AC

## 2022-03-27 MED ORDER — ALPRAZOLAM 0.5 MG PO TABS: 0.2500 mg | ORAL_TABLET | Freq: Two times a day (BID) | ORAL | 5 refills | Status: AC | PRN

## 2022-03-27 MED ORDER — MORPHINE SULFATE 10 MG/5ML PO SOLN: 2.5000 mg | ORAL | 0 refills | Status: DC | PRN

## 2022-03-27 NOTE — Plan of Care (Signed)
  Problem: Education: Goal: Knowledge of General Education information will improve Description Including pain rating scale, medication(s)/side effects and non-pharmacologic comfort measures Outcome: Progressing   Problem: Health Behavior/Discharge Planning: Goal: Ability to manage health-related needs will improve Outcome: Progressing   

## 2022-03-27 NOTE — Progress Notes (Signed)
Provided discharge packet for receiving facility.Called report and spoke with RN Monet.  Val Eagle

## 2022-03-27 NOTE — TOC Transition Note (Signed)
Transition of Care [facility]) - CM/SW Discharge Note   Patient Details  Name: Rebecca [name] MRN: [id number] Date of Birth: Feb 04, 1941  Transition of Care [facility]) CM/SW Contact:  Adrian Prows, RN Phone Number: 03/27/2022, 11:45 AM   Clinical Narrative: pt to d/c to [facility]; d/c summary and SNF reports sent via HUB,  confirmation; Rm # [phone]; call report # [phone]; pt's dtr Elder Negus notified ; calling [facility] for transport; no further CM needs.    Final next level of care: Skilled Nursing Facility ([address] [place], [place] [id number]) Barriers to Discharge: No Barriers Identified   Patient Goals and CMS Choice Patient states their goals for this hospitalization and ongoing recovery are::  (Rehab) CMS Medicare.gov Compare Post Acute Care list provided to:: Patient Represenative (must comment) [name] (dtr)) Choice offered to / list presented to : Adult Children  Discharge Placement                       Discharge Plan and Services   Discharge Planning Services: CM Consult Post Acute Care Choice: Skilled Nursing Facility                               Social Determinants of Health (SDOH) Interventions     Readmission Risk Interventions    03/24/2022    3:21 PM  Readmission Risk Prevention Plan  Transportation Screening Complete  PCP or Specialist Appt within 5-7 Days Complete  Home Care Screening Complete  Medication Review (RN CM) Complete

## 2022-03-27 NOTE — Telephone Encounter (Signed)
I called and spoke with [employer] and they stated that before they could do a Bipap she would have to show failure of a CPAP and that transitioning to a Bipap would be better for her sleep. Does this  makes sense? Please advise.

## 2022-03-27 NOTE — Telephone Encounter (Signed)
Orders placed per Dr. Vassie Loll. Nothing further needed,

## 2022-03-27 NOTE — Addendum Note (Signed)
Addended by: Arvilla Market on: 03/27/2022 12:10 PM   Modules accepted: Orders

## 2022-03-27 NOTE — Progress Notes (Addendum)
Physical Therapy Treatment Patient Details Name: Rebecca [name] MRN: [id number] DOB: [birth year] Today's Date: 03/27/2022   History of Present Illness Pt is a 81 yo female admitted with Acute on chronic respiratory failure with hypoxia and hypercapnia. PMH: end stage COPD, chronic respiratory failure with hypoxia and hypercapnia, sCHF EF 40-45%, and HTN    PT Comments    Pt assisted into bathroom and then ambulated in hallway using rollator.  Pt required seated rest break during hallway ambulation.  Pt anticipates d/c to SNF today.    Recommendations for follow up therapy are one component of a multi-disciplinary discharge planning process, led by the attending physician.  Recommendations may be updated based on patient status, additional functional criteria and insurance authorization.  Follow Up Recommendations  Skilled nursing-short term rehab (<3 hours/day) Can patient physically be transported by private vehicle: Yes   Assistance Recommended at Discharge Frequent or constant Supervision/Assistance  Patient can return home with the following A little help with walking and/or transfers;A little help with bathing/dressing/bathroom;Assistance with cooking/housework   Equipment Recommendations  None recommended by PT    Recommendations for Other Services       Precautions / Restrictions Precautions Precautions: Fall Precaution Comments: monitor O2     Mobility  Bed Mobility                    Transfers Overall transfer level: Needs assistance Equipment used: Rolling walker (2 wheels) Transfers: Sit to/from Stand Sit to Stand: Supervision           General transfer comment: supervision for safety    Ambulation/Gait Ambulation/Gait assistance: Min guard Gait Distance (Feet): 50 Feet (x2) Assistive device: Rollator (4 wheels) Gait Pattern/deviations: Step-through pattern, Decreased stride length       General Gait Details: pt on 5L O2 [name] in room and  requested ambulating on 4L; SpO2 down to 87% on 4L O2 [name] however improved to lower 90s with seated rest break and pursed lip breathing pt performed 2' x2 with seated rest break   Stairs             Wheelchair Mobility    Modified Rankin (Stroke Patients Only)       Balance                                            Cognition Arousal/Alertness: Awake/alert Behavior During Therapy: WFL for tasks assessed/performed Overall Cognitive Status: Within Functional Limits for tasks assessed                                          Exercises      General Comments        Pertinent Vitals/Pain Pain Assessment Pain Assessment: No/denies pain    Home Living                          Prior Function            PT Goals (current goals can now be found in the care plan section) Progress towards PT goals: Progressing toward goals    Frequency    Min 3X/week      PT Plan Current plan remains appropriate    Co-evaluation  AM-PAC PT "6 Clicks" Mobility   Outcome Measure  Help needed turning from your back to your side while in a flat bed without using bedrails?: None Help needed moving from lying on your back to sitting on the side of a flat bed without using bedrails?: None Help needed moving to and from a bed to a chair (including a wheelchair)?: A Little Help needed standing up from a chair using your arms (e.g., wheelchair or bedside chair)?: A Little Help needed to walk in hospital room?: A Little Help needed climbing 3-5 steps with a railing? : A Lot 6 Click Score: 19    End of Session Equipment Utilized During Treatment: Oxygen Activity Tolerance: Patient tolerated treatment well Patient left: in chair;with call bell/phone within reach   PT Visit Diagnosis: Other abnormalities of gait and mobility (R26.89);Muscle weakness (generalized) (M62.81)     Time: 9509-3267 PT Time Calculation  (min) (ACUTE ONLY): 24 min  Charges:  $Gait Training: 23-37 mins                    [name] PT, DPT Physical Therapist Acute Rehabilitation Services Preferred contact method: Secure Chat Weekend Pager Only: [phone] Office: [phone]    [name] [place] 03/27/2022, 2:17 PM

## 2022-03-27 NOTE — Discharge Summary (Addendum)
Physician Discharge Summary  Rebecca [name] WUJ:[id number] DOB: [birth year] DOA: 03/20/2022  PCP: Farris Has, MD  Admit date: 03/20/2022 Discharge date: 03/27/2022  Admitted From: [facility] Disposition:  SNF  Recommendations for Outpatient Follow-up:  Follow up with PCP in 1-2 weeks Do a slow prednisone taper, decrease by 10 mg/week, for now keep on 40 mg until 9/20, then 30 mg, and follow-up with Dr. Vassie Loll as an outpatient within the next 1 to 2 weeks for further evaluation  Home Health: none Equipment/Devices: none  Discharge Condition: stable CODE STATUS: Full code Transport: via [facility]  HPI: Per admitting MD, Rebecca [name] is a 81 y.o. 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 Symptoms ongoing for the past week at rest and with exertion.  Patient lives alone.  She has chronic dyspnea with baseline O2 on 5-6L and is mostly sedentary because of that. Has chest tightness but no LE edema. Previously on chronic prednisone for past 2 months but was tapered off about 2 weeks ago. Has been taking q4hr nebulizer without relieve. No cough. No fever. No nausea, vomiting, diarrhea or abdominal pain. Quit tobacco 20years ago. No second hand exposure. In the ED, she was afebrile and tachycardic with heart rate up to 128 and sinus tachycardia, tachypneic with hypercapnia with CO2 of 90.  She was placed on BiPAP but had difficulty tolerating this. No leukocytosis.  Hemoglobin of 11. Mild hypokalemia 3.4, glucose of 176.  Normal creatinine of 0.70 Chest x-ray showed no pneumonia or edema. Request then made to transfer to [facility] long for further management. She was off Bipap and on home 6L on arrival.   Hospital Course / Discharge diagnoses: Principal Problem:   Acute on chronic respiratory failure with hypoxia and hypercapnia (HCC) Active Problems:    Chronic obstructive pulmonary disease with acute exacerbation (HCC)   CAD (coronary artery disease)   Essential hypertension   Type 2 diabetes mellitus with complication, without long-term current use of insulin (HCC)   Hypokalemia   Chronic combined systolic and diastolic CHF (congestive heart failure) (HCC)   Anemia, chronic disease  Principal problem Acute on chronic respiratory failure with hypoxia and hypercapnia (HCC)-Patient with severe end-stage COPD now progressing.  Probably some degree of flare now, as symptoms started/got worse after tapering off a long prednisone taper.  She was placed again on steroids, nebulizers, with significant improvement and currently appears close to baseline.  Recommend a much slower taper, and close outpatient follow-up with Dr. Vassie Loll as soon as possible.  Her wheezing has resolved and appears back to baseline   Active problems active problems Chronic obstructive pulmonary disease with acute exacerbation (HCC) -FEV 31% predicted in 2019. Patient started on steroids, and had slow improvement.  She started on morphine, and this provided excellent symptomatic improvement. Completed 5 days azithromycin. Anemia, chronic disease -Hemoglobin stable relative to baseline, no clinical bleeding Chronic combined systolic and diastolic CHF (congestive heart failure) (HCC) -No evidence of fluid overload on chest x-ray or clinical exam.  Continue home regimen Hypokalemia -Continue home potassium Type 2 diabetes mellitus with complication, without long-term current use of insulin (HCC) -continue home regimen Essential hypertension -Continue losartan, metoprolol, torsemide CAD (coronary artery disease) -Continue aspirin, ARB, metoprolol  Sepsis ruled out   Discharge Instructions   Allergies as of 03/27/2022       Reactions   Ciprofloxacin Other (See Comments)  Digestive problems   Gabapentin Other (See Comments)   Affects breathing   Pravastatin Sodium Other  (See Comments)   Muscle aches    Rosuvastatin Calcium    Muscle Aches    Simvastatin    Muscle aches  Other reaction(s): Muscle pain   Amoxicillin Rash   Lisinopril Rash   Other reaction(s): rash   Sulfacetamide Rash   Tetracyclines & Related Rash        Medication List     TAKE these medications    acetaminophen 500 MG tablet Commonly known as: TYLENOL Take 1,000 mg by mouth 2 (two) times daily as needed for mild pain or headache.   albuterol 108 (90 Base) MCG/ACT inhaler Commonly known as: VENTOLIN HFA Inhale 2 puffs into the lungs every 6 (six) hours as needed for wheezing or shortness of breath.   albuterol (2.5 MG/3ML) 0.083% nebulizer solution Commonly known as: PROVENTIL INHALE 1 VIAL BY NEBULIZATION EVERY DAY IN THE MORNING, AT NOON, AND AT BEDTIME. USE AS DIRECTED.   ALPRAZolam 0.5 MG tablet Commonly known as: XANAX Take 0.5 tablets (0.25 mg total) by mouth 2 (two) times daily as needed for anxiety.   arformoterol 15 MCG/2ML Nebu Commonly known as: BROVANA USE 1 VIAL  IN  NEBULIZER TWICE  DAILY - morning and evening   aspirin EC 81 MG tablet Take 81 mg by mouth daily. Swallow whole.   AZO Cranberry 250-30 MG Tabs Take by mouth.   brimonidine 0.2 % ophthalmic solution Commonly known as: ALPHAGAN Place 1 drop into the left eye 2 (two) times daily.   budesonide 0.5 MG/2ML nebulizer solution Commonly known as: PULMICORT USE 1 VIAL  IN  NEBULIZER TWICE  DAILY - Rinse mouth after treatment   Calcium Carbonate-Vitamin D 600-200 MG-UNIT Tabs Take 1 tablet by mouth every evening.   carboxymethylcellulose 0.5 % Soln Commonly known as: REFRESH PLUS Place 1 drop into both eyes 3 (three) times daily as needed (dry eyes).   docusate sodium 100 MG capsule Commonly known as: COLACE Take 100 mg by mouth daily.   EYE VITAMINS PO Take 1 tablet by mouth daily.   fluticasone 50 MCG/ACT nasal spray Commonly known as: FLONASE 2 sprays as needed.    ipratropium 0.02 % nebulizer solution Commonly known as: ATROVENT Take 1.25 mLs (0.25 mg total) by nebulization in the morning, at noon, in the evening, and at bedtime. USE 1 VIAL IN NEBULIZER 3 TIMES DAILY   losartan 50 MG tablet Commonly known as: COZAAR Take 1 tablet (50 mg total) by mouth daily.   magnesium hydroxide 400 MG/5ML suspension Commonly known as: MILK OF MAGNESIA If no BM in 3 days, give 30 cc Milk of Magnesium p.o. x 1 dose in 24 hours as needed   metFORMIN 500 MG tablet Commonly known as: GLUCOPHAGE Take 1 tablet (500 mg total) by mouth daily with supper.   metoprolol tartrate 25 MG tablet Commonly known as: LOPRESSOR TAKE 1/2 TABLET TWICE DAILY   morphine 10 MG/5ML solution Take 1.3 mLs (2.6 mg total) by mouth every 2 (two) hours as needed (dyspnea).   Polyethylene Glycol 3350 Powd by Does not apply route. TAKE 17GM IN 4-80z OF FLUID OF CHOICE ONCE DAILY FOR CONSTIPATION   potassium chloride 10 MEQ tablet Commonly known as: KLOR-CON M Take 1 tablet (10 mEq total) by mouth 2 (two) times daily.   predniSONE 20 MG tablet Commonly known as: DELTASONE Take 2 tablets (40 mg total) by mouth daily with breakfast. Start  taking on: March 28, 2022   PROBIOTIC PO Take 1 tablet by mouth daily. Hyperbiotics pro-women   RA SALINE ENEMA RE If not relieved by Biscodyl suppository, give disposable Saline Enema rectally X 1 dose/24 hrs as needed. Contact MD as needed if no results from enema   senna 8.6 MG Tabs tablet Commonly known as: SENOKOT Take 2 tablets by mouth 2 (two) times daily.   torsemide 20 MG tablet Commonly known as: DEMADEX TAKE 1 TABLET EVERY DAY         Procedures/Studies:  DG Chest Port 1 View  Result Date: 03/20/2022 CLINICAL DATA:  Shortness of breath and COPD EXAM: PORTABLE CHEST 1 VIEW COMPARISON:  07/31/2021 FINDINGS: Lungs are hyperinflated as can be seen with COPD. No focal consolidation. No pleural effusion or pneumothorax.  Heart and mediastinal contours are unremarkable. No acute osseous abnormality. IMPRESSION: 1. No active disease. 2. COPD. Electronically Signed   By: Kathreen Devoid M.D.   On: 03/20/2022 10:22     Subjective: - no chest pain, shortness of breath, no abdominal pain, nausea or vomiting.   Discharge Exam: BP (!) 116/56   Pulse 96   Temp 98.1 F (36.7 C) (Oral)   Resp 18   Ht 5\' 2"  (1.575 m)   Wt 68.9 kg   SpO2 96%   BMI 27.80 kg/m   General: Pt is alert, awake, not in acute distress Cardiovascular: RRR, S1/S2 +, no rubs, no gallops Respiratory: CTA bilaterally, no wheezing, no rhonchi Abdominal: Soft, NT, ND, bowel sounds + Extremities: no edema, no cyanosis    The results of significant diagnostics from this hospitalization (including imaging, microbiology, ancillary and laboratory) are listed below for reference.     Microbiology: Recent Results (from the past 240 hour(s))  Resp Panel by RT-PCR (Flu A&B, Covid) Anterior Nasal Swab     Status: None   Collection Time: 03/20/22 11:35 AM   Specimen: Anterior Nasal Swab  Result Value Ref Range Status   SARS Coronavirus 2 by RT PCR NEGATIVE NEGATIVE Final    Comment: (NOTE) SARS-CoV-2 target nucleic acids are NOT DETECTED.  The SARS-CoV-2 RNA is generally detectable in upper respiratory specimens during the acute phase of infection. The lowest concentration of SARS-CoV-2 viral copies this assay can detect is 138 copies/mL. A negative result does not preclude SARS-Cov-2 infection and should not be used as the sole basis for treatment or other patient management decisions. A negative result may occur with  improper specimen collection/handling, submission of specimen other than nasopharyngeal swab, presence of viral mutation(s) within the areas targeted by this assay, and inadequate number of viral copies(<138 copies/mL). A negative result must be combined with clinical observations, patient history, and  epidemiological information. The expected result is Negative.  Fact Sheet for Patients:  EntrepreneurPulse.com.au  Fact Sheet for Healthcare Providers:  IncredibleEmployment.be  This test is no t yet approved or cleared by the [place] FDA and  has been authorized for detection and/or diagnosis of SARS-CoV-2 by FDA under an Emergency Use Authorization (EUA). This EUA will remain  in effect (meaning this test can be used) for the duration of the COVID-19 declaration under Section 564(b)(1) of the Act, 21 U.S.C.section 360bbb-3(b)(1), unless the authorization is terminated  or revoked sooner.       Influenza A by PCR NEGATIVE NEGATIVE Final   Influenza B by PCR NEGATIVE NEGATIVE Final    Comment: (NOTE) The Xpert Xpress SARS-CoV-2/FLU/RSV plus assay is intended as an aid in the diagnosis  of influenza from Nasopharyngeal swab specimens and should not be used as a sole basis for treatment. Nasal washings and aspirates are unacceptable for Xpert Xpress SARS-CoV-2/FLU/RSV testing.  Fact Sheet for Patients: EntrepreneurPulse.com.au  Fact Sheet for Healthcare Providers: IncredibleEmployment.be  This test is not yet approved or cleared by the [place] FDA and has been authorized for detection and/or diagnosis of SARS-CoV-2 by FDA under an Emergency Use Authorization (EUA). This EUA will remain in effect (meaning this test can be used) for the duration of the COVID-19 declaration under Section 564(b)(1) of the Act, 21 U.S.C. section 360bbb-3(b)(1), unless the authorization is terminated or revoked.  Performed at [facility], [address], [place], [place] [address]      Labs: Basic Metabolic Panel: Recent Labs  Lab 03/20/22 1139 03/20/22 1556 03/21/22 0554 03/24/22 0521  NA 140 140 139 140  K 3.4* 3.4* 3.9 4.4  CL  --   --  89* 88*  CO2  --   --  41* 44*  GLUCOSE  --    --  118* 115*  BUN  --   --  21 26*  CREATININE  --   --  0.66 0.74  CALCIUM  --   --  9.5 9.3   Liver Function Tests: No results for input(s): "AST", "ALT", "ALKPHOS", "BILITOT", "PROT", "ALBUMIN" in the last 168 hours. CBC: Recent Labs  Lab 03/20/22 1139 03/20/22 1556 03/21/22 0554 03/24/22 0521  WBC  --   --  7.5 9.1  HGB 10.5* 10.5* 10.0* 11.5*  HCT 31.0* 31.0* 32.2* 37.2  MCV  --   --  102.9* 106.3*  PLT  --   --  198 228   CBG: Recent Labs  Lab 03/26/22 0751 03/26/22 1203 03/26/22 1700 03/26/22 2120 03/27/22 0809  GLUCAP 107* 147* 218* 161* 106*   Hgb A1c No results for input(s): "HGBA1C" in the last 72 hours. Lipid Profile No results for input(s): "CHOL", "HDL", "LDLCALC", "TRIG", "CHOLHDL", "LDLDIRECT" in the last 72 hours. Thyroid function studies No results for input(s): "TSH", "T4TOTAL", "T3FREE", "THYROIDAB" in the last 72 hours.  Invalid input(s): "FREET3" Urinalysis    Component Value Date/Time   COLORURINE YELLOW 01/12/2020 1256   APPEARANCEUR HAZY (A) 01/12/2020 1256   LABSPEC 1.008 01/12/2020 1256   PHURINE 6.0 01/12/2020 1256   GLUCOSEU NEGATIVE 01/12/2020 1256   HGBUR SMALL (A) 01/12/2020 1256   BILIRUBINUR NEGATIVE 01/12/2020 1256   KETONESUR 5 (A) 01/12/2020 1256   PROTEINUR NEGATIVE 01/12/2020 1256   NITRITE NEGATIVE 01/12/2020 1256   LEUKOCYTESUR NEGATIVE 01/12/2020 1256    FURTHER DISCHARGE INSTRUCTIONS:   Get Medicines reviewed and adjusted: Please take all your medications with you for your next visit with your Primary MD   Laboratory/radiological data: Please request your Primary MD to go over all hospital tests and procedure/radiological results at the follow up, please ask your Primary MD to get all Hospital records sent to his/her office.   In some cases, they will be blood work, cultures and biopsy results pending at the time of your discharge. Please request that your primary care M.D. goes through all the records of your  hospital data and follows up on these results.   Also Note the following: If you experience worsening of your admission symptoms, develop shortness of breath, life threatening emergency, suicidal or homicidal thoughts you must seek medical attention immediately by calling 911 or calling your MD immediately  if symptoms less severe.   You must read complete  instructions/literature along with all the possible adverse reactions/side effects for all the Medicines you take and that have been prescribed to you. Take any new Medicines after you have completely understood and accpet all the possible adverse reactions/side effects.    Do not drive when taking Pain medications or sleeping medications (Benzodaizepines)   Do not take more than prescribed Pain, Sleep and Anxiety Medications. It is not advisable to combine anxiety,sleep and pain medications without talking with your primary care practitioner   Special Instructions: If you have smoked or chewed Tobacco  in the last 2 yrs please stop smoking, stop any regular Alcohol  and or any Recreational drug use.   Wear Seat belts while driving.   Please note: You were cared for by a hospitalist during your hospital stay. Once you are discharged, your primary care physician will handle any further medical issues. Please note that NO REFILLS for any discharge medications will be authorized once you are discharged, as it is imperative that you return to your primary care physician (or establish a relationship with a primary care physician if you do not have one) for your post hospital discharge needs so that they can reassess your need for medications and monitor your lab values.  Time coordinating discharge: 40 minutes  SIGNED:  Marzetta Board, MD, PhD 03/27/2022, 11:10 AM

## 2022-03-28 DIAGNOSIS — E785 Hyperlipidemia, unspecified: Secondary | ICD-10-CM | POA: Diagnosis not present

## 2022-03-28 DIAGNOSIS — D638 Anemia in other chronic diseases classified elsewhere: Secondary | ICD-10-CM | POA: Diagnosis not present

## 2022-03-28 DIAGNOSIS — E118 Type 2 diabetes mellitus with unspecified complications: Secondary | ICD-10-CM | POA: Diagnosis not present

## 2022-03-28 DIAGNOSIS — J441 Chronic obstructive pulmonary disease with (acute) exacerbation: Secondary | ICD-10-CM | POA: Diagnosis not present

## 2022-03-28 DIAGNOSIS — J9621 Acute and chronic respiratory failure with hypoxia: Secondary | ICD-10-CM | POA: Diagnosis not present

## 2022-03-28 DIAGNOSIS — E876 Hypokalemia: Secondary | ICD-10-CM | POA: Diagnosis not present

## 2022-03-28 DIAGNOSIS — I2510 Atherosclerotic heart disease of native coronary artery without angina pectoris: Secondary | ICD-10-CM | POA: Diagnosis not present

## 2022-03-28 DIAGNOSIS — J9622 Acute and chronic respiratory failure with hypercapnia: Secondary | ICD-10-CM | POA: Diagnosis not present

## 2022-04-01 DIAGNOSIS — I2510 Atherosclerotic heart disease of native coronary artery without angina pectoris: Secondary | ICD-10-CM | POA: Diagnosis not present

## 2022-04-01 DIAGNOSIS — E785 Hyperlipidemia, unspecified: Secondary | ICD-10-CM | POA: Diagnosis not present

## 2022-04-01 DIAGNOSIS — J9622 Acute and chronic respiratory failure with hypercapnia: Secondary | ICD-10-CM | POA: Diagnosis not present

## 2022-04-01 DIAGNOSIS — I10 Essential (primary) hypertension: Secondary | ICD-10-CM | POA: Diagnosis not present

## 2022-04-01 DIAGNOSIS — E118 Type 2 diabetes mellitus with unspecified complications: Secondary | ICD-10-CM | POA: Diagnosis not present

## 2022-04-01 DIAGNOSIS — J9621 Acute and chronic respiratory failure with hypoxia: Secondary | ICD-10-CM | POA: Diagnosis not present

## 2022-04-01 DIAGNOSIS — E119 Type 2 diabetes mellitus without complications: Secondary | ICD-10-CM | POA: Diagnosis not present

## 2022-04-01 DIAGNOSIS — J441 Chronic obstructive pulmonary disease with (acute) exacerbation: Secondary | ICD-10-CM | POA: Diagnosis not present

## 2022-04-01 DIAGNOSIS — M6281 Muscle weakness (generalized): Secondary | ICD-10-CM | POA: Diagnosis not present

## 2022-04-01 DIAGNOSIS — E876 Hypokalemia: Secondary | ICD-10-CM | POA: Diagnosis not present

## 2022-04-02 ENCOUNTER — Encounter: Payer: Self-pay | Admitting: Pulmonary Disease

## 2022-04-03 ENCOUNTER — Non-Acute Institutional Stay: Payer: Medicare HMO | Admitting: Internal Medicine

## 2022-04-03 VITALS — BP 135/74 | HR 93 | Temp 98.0°F | Wt 155.6 lb

## 2022-04-03 DIAGNOSIS — E119 Type 2 diabetes mellitus without complications: Secondary | ICD-10-CM | POA: Diagnosis not present

## 2022-04-03 DIAGNOSIS — J449 Chronic obstructive pulmonary disease, unspecified: Secondary | ICD-10-CM | POA: Diagnosis not present

## 2022-04-03 DIAGNOSIS — R0602 Shortness of breath: Secondary | ICD-10-CM | POA: Diagnosis not present

## 2022-04-03 DIAGNOSIS — I5020 Unspecified systolic (congestive) heart failure: Secondary | ICD-10-CM | POA: Diagnosis not present

## 2022-04-03 DIAGNOSIS — J441 Chronic obstructive pulmonary disease with (acute) exacerbation: Secondary | ICD-10-CM | POA: Diagnosis not present

## 2022-04-03 DIAGNOSIS — Z515 Encounter for palliative care: Secondary | ICD-10-CM

## 2022-04-03 DIAGNOSIS — I5042 Chronic combined systolic (congestive) and diastolic (congestive) heart failure: Secondary | ICD-10-CM

## 2022-04-03 DIAGNOSIS — J9612 Chronic respiratory failure with hypercapnia: Secondary | ICD-10-CM | POA: Diagnosis not present

## 2022-04-03 DIAGNOSIS — J4489 Other specified chronic obstructive pulmonary disease: Secondary | ICD-10-CM

## 2022-04-03 DIAGNOSIS — R413 Other amnesia: Secondary | ICD-10-CM | POA: Diagnosis not present

## 2022-04-03 DIAGNOSIS — J9611 Chronic respiratory failure with hypoxia: Secondary | ICD-10-CM

## 2022-04-03 DIAGNOSIS — M6281 Muscle weakness (generalized): Secondary | ICD-10-CM | POA: Diagnosis not present

## 2022-04-03 NOTE — Progress Notes (Signed)
[facility] Consult Note Telephone: [phone]  Fax: [phone]   Date of encounter:04/03/22 3:07 PM PATIENT NAME: Rebecca [name] [address] [place] [id number]   [phone] (home)  DOB: 04-Feb-1941 MRN: [id number] PRIMARY CARE PROVIDER:    London Pepper, MD,  [place] [address] [phone]  REFERRING PROVIDER:   London Pepper, MD [address] [place],  [place] [address] [phone]  RESPONSIBLE PARTY:    Contact Information     Name Relation Home Work Mobile   [name],Rebecca Daughter [phone]  [phone]   [name],Rebecca Daughter [phone]  [phone]        I met face to face with patient and family in [facility]. Palliative Care was asked to follow this patient by consultation request of  Rebecca Pepper, MD to address advance care planning and complex medical decision making. This is the initial visit.                                     ASSESSMENT AND PLAN / RECOMMENDATIONS:   Advance Care Planning/Goals of Care: Goals include to maximize quality of life and symptom management. Patient/health care surrogate gave his/her permission to discuss.Our advance care planning conversation included a discussion about:    The value and importance of advance care planning  Experiences with loved ones who have been seriously ill or have died  Exploration of personal, cultural or spiritual beliefs that might influence medical decisions  Exploration of goals of care in the event of a sudden injury or illness  Identification  of a healthcare agent  Review and updating or creation of an  advance directive document . Decision not to resuscitate or to de-escalate disease focused treatments due to poor prognosis. CODE STATUS:  FULL CODE, reviewed with her again today and she was considering DNR, but wanted time to think about it; MOST done  keeping full code, does not want to be hooked to machines though with ventilator so if needed after cpr restores, would prefer to be allowed to pass on, limited additional interventions, IVF trial, abx if indicated and no tube feeding  Symptom Management/Plan: 1. Shortness of breath -much improved with morphine which she reports she is getting here though I did not see it on her MAR, continue it due to benefit - morphine 10 MG/5ML solution; Take 1.3 mLs (2.6 mg total) by mouth every 4 (four) hours as needed (dyspnea).  Dispense: 100 mL; Refill: 0  2. COPD with chronic bronchitis and emphysema ([place]) -continue regimen per pulmonary -encouraged spirometry, acapella use - morphine 10 MG/5ML solution; Take 1.3 mLs (2.6 mg total) by mouth every 4 (four) hours as needed (dyspnea).  Dispense: 100 mL; Refill: 0 -has not had remarkable weight changes  3. Chronic respiratory failure with hypoxia and hypercapnia (HCC) -continue O2 at 5-6 L she's on at baseline, reports she is to go home with oxygen supplies through [facility] and get bipap from them  4. Memory loss -notable over course of visit she was not retaining; apparently ST knew about this and had recommended she make notes   5. Chronic combined systolic and diastolic CHF (congestive heart failure) (HCC) -appears euvolemic at present, monitor carefully with weights, low sodium diet and fluid restrictions  6. Palliative care by specialist -MOST completed with her today as above -will need  f/u at her IL and with daughter Rebecca [name] to consider DNR order     Follow up Palliative Care Visit: Palliative care will continue to follow for complex medical decision making, advance care planning, and clarification of goals. Return at ILF   This visit was coded based on medical decision making (MDM). 30 mins spent on ACP  PPS: 40%  HOSPICE ELIGIBILITY/DIAGNOSIS: not yet/copd or chf  Chief Complaint: Initial SNF palliative consult (NP has seen in  [place])  HISTORY OF PRESENT ILLNESS:  Rebecca [name] is a 81 y.o. year old female  with  severe end stage COPD with chronic respiratory failure with baseline 5-6 L O2 even prior to admission 9/6 to 03/27/22, CAD, HTN, DMII , chronic combined chf, and hyperlipidemia seen in rehab s/p hospitalization.  She is known to [facility] as Rebecca Hippo, NP has seen her at [place] where she previously lived. She had presented with increased dyspnea, chest tightness and had been weaned off prednisone over the prior two weeks.  Q 4h nebs had not given her relief.  She has not smoked in 20 yrs.  In the ED, she was tachypneic and tachycardic with CO2 of 90.  She was placed on BiPAP which was difficult to tolerate.  She also had mild anemia with hbg 11, hypokalemia to 3.4, glucose 176, cr 0.7 wnl.  CXR was negative for infection and chf.   She was placed back on steroids, nebs continued, and abx with azithromycin.  FEV1 was 31 predicted and FVC 53%  in 2019.  She was started on morphine with excellent symptomatic improvement.    She had been independent except meals were prepped and housekeeping at [employer].  Did not drive anymore.  Tells me that her sats were 47% when ems arrived to eval her.  She doesn't like the food here but has not lost significant weight.  She's been taking morphine for sob 4-6 hrs apart with benefit.  She is to go back home Friday.    Wound care nurse did not small right mid to lower back fluid filled blister on her that's getting skin prepped and covered with foam.  History obtained from review of EMR, discussion with primary team, and interview with family, facility staff/caregiver and/or Ms. Pyle.   I reviewed available labs, medications, imaging, studies and related documents from the EMR.  Records reviewed and summarized above.   ROS Review of Systemssee hpi  Physical Exam: Vitals:   04/03/22 1506  BP: 135/74  Pulse: 93  Temp: 98  F (36.7 C)  SpO2: 95%  Weight: 155 lb 9.6 oz (70.6 kg)   Body mass index is 28.46 kg/m. Wt Readings from Last 500 Encounters:  04/03/22 155 lb 9.6 oz (70.6 kg)  03/20/22 152 lb (68.9 kg)  02/12/22 153 lb 9.6 oz (69.7 kg)  01/09/22 148 lb 12.8 oz (67.5 kg)  12/28/21 150 lb 3.2 oz (68.1 kg)  11/07/21 149 lb (67.6 kg)  09/25/21 153 lb 6.4 oz (69.6 kg)  07/31/21 155 lb (70.3 kg)  07/17/21 154 lb 9.6 oz (70.1 kg)  07/13/21 155 lb (70.3 kg)  07/06/21 156 lb (70.8 kg)  07/03/21 157 lb 3.2 oz (71.3 kg)  06/27/21 153 lb 6.4 oz (69.6 kg)  06/20/21 158 lb 11.7 oz (72 kg)  06/19/21 157 lb (71.2 kg)  06/14/21 156 lb 12.8 oz (71.1 kg)  06/05/21 161 lb (73 kg)  06/05/21 158 lb 12.8 oz (72 kg)  05/22/21 158  lb 12.8 oz (72 kg)  04/10/21 156 lb 3.2 oz (70.9 kg)  03/22/21 156 lb 6.4 oz (70.9 kg)  01/31/21 155 lb 12.8 oz (70.7 kg)  11/28/20 148 lb (67.1 kg)  10/31/20 153 lb 6.4 oz (69.6 kg)  09/26/20 154 lb 3.2 oz (69.9 kg)  08/07/20 152 lb (68.9 kg)  08/03/20 152 lb 6.4 oz (69.1 kg)  07/27/20 152 lb 9.6 oz (69.2 kg)  05/25/20 153 lb 3.2 oz (69.5 kg)  03/21/20 157 lb (71.2 kg)  02/21/20 155 lb (70.3 kg)  01/15/20 154 lb 15.7 oz (70.3 kg)  11/23/19 161 lb (73 kg)  09/28/19 164 lb (74.4 kg)  09/09/19 164 lb (74.4 kg)  02/25/19 165 lb 9.6 oz (75.1 kg)  07/02/18 163 lb 3.2 oz (74 kg)  06/15/18 164 lb 3.2 oz (74.5 kg)  05/26/18 167 lb 15.9 oz (76.2 kg)  05/12/18 166 lb 3.6 oz (75.4 kg)  04/23/18 163 lb 12.8 oz (74.3 kg)  04/14/18 164 lb 6.4 oz (74.6 kg)  04/14/18 163 lb 12.8 oz (74.3 kg)  03/31/18 163 lb 12.8 oz (74.3 kg)  03/17/18 161 lb 9.6 oz (73.3 kg)  02/27/18 160 lb 7.9 oz (72.8 kg)  02/05/18 161 lb (73 kg)   Physical Exam Constitutional:      Appearance: Normal appearance.  HENT:     Head: Normocephalic and atraumatic.     Right Ear: External ear normal.     Left Ear: External ear normal.     Mouth/Throat:     Pharynx: Oropharynx is clear.  Eyes:      Conjunctiva/sclera: Conjunctivae normal.     Pupils: Pupils are equal, round, and reactive to light.  Cardiovascular:     Rate and Rhythm: Regular rhythm. Tachycardia present.  Pulmonary:     Effort: Pulmonary effort is normal.     Breath sounds: Wheezing present. No rhonchi or rales.  Abdominal:     General: Bowel sounds are normal.     Palpations: Abdomen is soft.  Musculoskeletal:        General: Normal range of motion.     Right lower leg: Edema present.     Left lower leg: Edema present.  Skin:    General: Skin is warm and dry.  Neurological:     General: No focal deficit present.     Mental Status: She is alert and oriented to person, place, and time.     Comments: Short-term memory loss noted  Psychiatric:        Mood and Affect: Mood normal.     CURRENT PROBLEM LIST:  Patient Active Problem List   Diagnosis Date Noted   Gastroesophageal reflux disease 04/13/2022   Chronic combined systolic and diastolic CHF (congestive heart failure) ([place]) 03/21/2022   Anemia, chronic disease 03/21/2022   Acute on chronic respiratory failure with hypoxia and hypercapnia ([place]) 03/20/2022   Type 2 diabetes mellitus with complication, without long-term current use of insulin ([place]) 03/20/2022   Hypokalemia 03/20/2022   Aspiration pneumonia ([place]) 07/03/2021   Macrocytic anemia 06/28/2021   Unspecified protein-calorie malnutrition ([place]) 06/28/2021   Hemoptysis 06/19/2021   Hematochezia 06/19/2021   HCAP (healthcare-associated pneumonia)    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) 06/18/2021   CAD (coronary artery disease) 06/18/2021   Essential hypertension 06/18/2021   Hyperlipidemia 06/18/2021   Left bundle branch block 06/18/2021   Dyspnea on minimal exertion 06/14/2021   Heart block AV complete ([place]) 06/14/2021   Physical deconditioning 08/03/2020  Chronic obstructive pulmonary disease with acute exacerbation (HCC)    Palliative care encounter    Goals of care,  counseling/discussion    COPD exacerbation ([place]) 01/12/2020   Nonischemic cardiomyopathy ([place]) 07/02/2018   Lung nodule 06/18/2018   Chronic respiratory failure with hypoxia and hypercapnia ([place]) 02/05/2018   COPD with chronic bronchitis and emphysema ([place]) 02/05/2018   PAST MEDICAL HISTORY:  Active Ambulatory Problems    Diagnosis Date Noted   Chronic respiratory failure with hypoxia and hypercapnia ([place]) 02/05/2018   COPD with chronic bronchitis and emphysema ([place]) 02/05/2018   Lung nodule 06/18/2018   Nonischemic cardiomyopathy ([place]) 07/02/2018   COPD exacerbation ([place]) 01/12/2020   Chronic obstructive pulmonary disease with acute exacerbation ([place])    Palliative care encounter    Goals of care, counseling/discussion    Physical deconditioning 08/03/2020   Dyspnea on minimal exertion 06/14/2021   Heart block AV complete ([place]) 06/14/2021   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) 06/18/2021   CAD (coronary artery disease) 06/18/2021   Essential hypertension 06/18/2021   Hyperlipidemia 06/18/2021   Left bundle branch block 06/18/2021   Hemoptysis 06/19/2021   Hematochezia 06/19/2021   HCAP (healthcare-associated pneumonia)    Macrocytic anemia 06/28/2021   Unspecified protein-calorie malnutrition ([place]) 06/28/2021   Aspiration pneumonia ([place]) 07/03/2021   Acute on chronic respiratory failure with hypoxia and hypercapnia ([employer]) 03/20/2022   Type 2 diabetes mellitus with complication, without long-term current use of insulin ([place]) 03/20/2022   Hypokalemia 03/20/2022   Chronic combined systolic and diastolic CHF (congestive heart failure) ([place]) 03/21/2022   Anemia, chronic disease 03/21/2022   Gastroesophageal reflux disease 04/13/2022   Resolved Ambulatory Problems    Diagnosis Date Noted   Acute respiratory failure with hypoxia ([place]) 01/13/2020   Past Medical History:  Diagnosis Date   Congestive heart disease ([place])    COPD (chronic obstructive pulmonary disease)  ([place])    Diabetes ([place]) with atherosclerosis    Hyperlipemia    Osteoporosis    Primary hypertension    SOCIAL HX:  Social History   Tobacco Use   Smoking status: Former    Packs/day: 1.50    Years: 40.00    Total pack years: 60.00    Types: Cigarettes    Quit date: 2003    Years since quitting: 20.7   Smokeless tobacco: Never  Substance Use Topics   Alcohol use: Yes    Alcohol/week: 1.0 standard drink of alcohol    Types: 1 Glasses of wine per week   FAMILY HX: No family history on file.    ALLERGIES:  Allergies  Allergen Reactions   Ciprofloxacin Other (See Comments)    Digestive problems   Gabapentin Other (See Comments)    Affects breathing   Pravastatin Sodium Other (See Comments)    Muscle aches    Rosuvastatin Calcium     Muscle Aches    Simvastatin     Muscle aches  Other reaction(s): Muscle pain   Amoxicillin Rash   Lisinopril Rash    Other reaction(s): rash   Sulfacetamide Rash   Tetracyclines & Related Rash      PERTINENT MEDICATIONS:  MED PREDNISONE 20 MG TABLET TAKE 1 AND 1/2 TABLETS (30MG) BY MOUTH ONCE DAILY DeMarchi, William 04/04/2022 10:00 AM MED LOSARTAN POTASSIUM 50 MG TAB TAKE 1 TABLET BY MOUTH ONCE DAILY (RtP) Essential (primary) hypertension (I10) DeMarchi, William 03/29/2022 10:00 AM MED TORSEMIDE 20 MG TABLET TAKE 1 TABLET BY MOUTH ONCE DAILY Unspecified systolic (congestive) heart failure (  I50.20) DeMarchi, William 03/29/2022 10:00 AM MED AZO CRANBERRY TABLET TAKE 1 TABLET BY MOUTH ONCE DAILY DeMarchi, William 03/29/2022 10:00 AM MED PREDNISONE 20 MG TABLET TAKE 2 TABLETS (40MG) BY MOUTH ONCE DAILY WITH BREAKFAST DeMarchi, William 03/29/2022 8:30 AM MED METOPROLOL TARTRATE 25 MG TAB TAKE 1/2 TABLET (12.5MG) BY MOUTH TWICE A DAY (RtP) Essential (primary) hypertension (I10) DeMarchi, William 03/28/2022 10:00 PM MED ARFORMOTEROL 15 MCG/2 ML SOLN INHALE 1 VIAL VIA NEBULIZER EVERY MORNING AND AT BEDTIME Chronic  obstructive pulmonary disease, unspecified (J44.9) DeMarchi, William 03/28/2022 10:00 PM MED BRIMONIDINE 0.2% EYE DROP INSTILL 1 DROP IN THE LEFT EYE TWICE A DAY DeMarchi, William 03/28/2022 10:00 PM MED POTASSIUM CL ER 10 MEQ TABLET TAKE 1 TABLET BY MOUTH TWICE A DAY (DO NOT CRUSH) DeMarchi, William 03/28/2022 10:00 PM MED CALCIUM 600 MG-VIT D3 5 MCG TB TAKE 1 TABLET BY MOUTH EVERY EVENING DeMarchi, William 03/28/2022 7:00 PM MED METFORMIN HCL 500 MG TABLET TAKE 1 TABLET BY MOUTH ONCE DAILY WITH SUPPER Type 2 diabetes mellitus without complications (A25.0) DeMarchi, Gwyndolyn Saxon 03/28/2022 6:00 PM MED BUDESONIDE 0.5 MG/2 ML SUSP INHALE 1 VIAL VIA NEBULIZER TWICE A DAY (RINSE MOUTH AFTER TREATMENT) Chronic respiratory failure with hypoxia (J96.11) DeMarchi, Gwyndolyn Saxon 03/28/2022 11:42 AM MED IPRATROPIUM BR 0.02% SOLN INHALE 1.25ML (0.25MG) BY NEBULIZATION EVERY MORNING, AT 12PM, EVERY EVENING, AND AT BEDTIME Chronic obstructive pulmonary disease, unspecified (J44.9) DeMarchi, Gwyndolyn Saxon 03/28/2022 11:40 AM MED ALBUTEROL SUL 2.5 MG/3 ML SOLN INHALE 1 VIAL VIA NEBULIZER EVERY MORNING, AT 12PM, AND AT BEDTIME Acute and chronic respiratory failure with hypoxia (J96.21) DeMarchi, Gwyndolyn Saxon 03/28/2022 11:39 AM MED ALBUTEROL HFA 90 MCG INHALER INHALE 2 PUFFS INTO THE LUNGS EVERY 6 HOURS AS NEEDED FOR WHEEZING/SOB DeMarchi, William 03/28/2022 10:30 AM MED LUBRICANT 0.5% EYE DROP INSTILL 1 DROP IN BOTH EYES THREE TIMES DAILY AS NEEDED FOR DRY EYES DeMarchi, Gwyndolyn Saxon 03/28/2022 10:30 AM MED ACETAMINOPHEN 500 MG TABLET:Give 2 tablets ( 1,000 mg total) by mouth twice daily as needed for mild pain or headache DeMarchi, Gwyndolyn Saxon 03/27/2022 4:08 PM MED SENNA 8.6 MG TABLET:Give 2 tablets ( 17.2 mg total ) by mouth twice daily DeMarchi, Gwyndolyn Saxon 03/27/2022 3:32 PM MED PROBIOTIC 250 MG CAPSULE:Give 1 capsule by mouth daily DeMarchi, Gwyndolyn Saxon 03/27/2022 3:30 PM MED MIRALAX POWDER:Mix 17 g in 4 oz  of water or fluid of choice and give by mouth daily for constipation DeMarchi, Gwyndolyn Saxon 03/27/2022 3:27 PM MED MORPHINE SULF 10 MG/5 ML SOLN;Take 1.3 mls ( 2.6 mg total ) by mouth every 2 hours as needed for Dyspnea x 14 days DeMarchi, Gwyndolyn Saxon 03/27/2022 2:37 PM MED FLONASE ALLERGY RLF 50 MCG NLZ:JQBHA 2 sprays in both nostrils daily as needed DeMarchi, Gwyndolyn Saxon 03/27/2022 2:26 PM MED EYE MULTIVITAMIN TABLET:Give 1 tablet by mouth daily DeMarchi, Gwyndolyn Saxon 03/27/2022 1:54 PM MED COLACE 100 MG CAPSULE:Give 1 capsule by mouth daily for constipation DeMarchi, Gwyndolyn Saxon 03/27/2022 1:53 PM MED ASPIRIN EC 81 MG TABLET:Give 1 tablet by mouth daily .Swallow whole [place] heart disease of native coronary artery w/o ang pctrs (I25.10) DeMarchi, Gwyndolyn Saxon 03/27/2022 1:41 PM MED ALPRAZOLAM 0.25 MG TABLET:Give 1 tablet by mouth twice daily as needed for anxiety x 14 days Madison Hickman 03/27/2022 1:37 PM MED Constipation (3 of 4): If not relieved by Biscodyl suppository, give disposable Saline Enema rectally X 1 dose/24 hrs as needed (Do not use constipation standing orders for residents with renal failure/CFR less than 30. Contact MD for orders)(Physician Or Madison Hickman 03/27/2022 12:58 PM MED Constipation (1 of 4): If  no BM in 3 days, give 30 cc Milk of Magnesium p.o. x 1 dose in 24 hours as needed (Do not use standing constipation orders for residents with renal failure CFR less than 30. Contact MD for orders) (Physician Order) Madison Hickman 03/27/2022 12:57 PM MED Constipation (2 of 4): If not relieved by MOM, give 10 mg Bisacodyl suppositiory rectally X 1 dose in 24 hours as needed (Do not use constipation standing orders for residents with renal failure/CFR less than 30. Contact MD for orders) (Physician Order) Madison Hickman 03/27/2022 12:5  Thank you for the opportunity to participate in the care of Ms. Warf.  The palliative care team will continue to follow. Please  call our office at [phone] if we can be of additional assistance.   Hollace Kinnier, DO  COVID-19 PATIENT SCREENING TOOL Asked and negative response unless otherwise noted:  Have you had symptoms of covid, tested positive or been in contact with someone with symptoms/positive test in the past 5-10 days?  NO

## 2022-04-04 ENCOUNTER — Other Ambulatory Visit: Payer: Self-pay

## 2022-04-04 DIAGNOSIS — E785 Hyperlipidemia, unspecified: Secondary | ICD-10-CM | POA: Diagnosis not present

## 2022-04-04 DIAGNOSIS — J9611 Chronic respiratory failure with hypoxia: Secondary | ICD-10-CM

## 2022-04-04 DIAGNOSIS — J441 Chronic obstructive pulmonary disease with (acute) exacerbation: Secondary | ICD-10-CM | POA: Diagnosis not present

## 2022-04-04 DIAGNOSIS — J9622 Acute and chronic respiratory failure with hypercapnia: Secondary | ICD-10-CM | POA: Diagnosis not present

## 2022-04-04 DIAGNOSIS — J9621 Acute and chronic respiratory failure with hypoxia: Secondary | ICD-10-CM | POA: Diagnosis not present

## 2022-04-04 DIAGNOSIS — I2510 Atherosclerotic heart disease of native coronary artery without angina pectoris: Secondary | ICD-10-CM | POA: Diagnosis not present

## 2022-04-04 DIAGNOSIS — E118 Type 2 diabetes mellitus with unspecified complications: Secondary | ICD-10-CM | POA: Diagnosis not present

## 2022-04-04 DIAGNOSIS — E876 Hypokalemia: Secondary | ICD-10-CM | POA: Diagnosis not present

## 2022-04-05 ENCOUNTER — Non-Acute Institutional Stay: Payer: Medicare HMO | Admitting: Internal Medicine

## 2022-04-05 ENCOUNTER — Telehealth: Payer: Self-pay | Admitting: *Deleted

## 2022-04-05 MED ORDER — MORPHINE SULFATE 10 MG/5ML PO SOLN: 2.5000 mg | ORAL | 0 refills | Status: AC | PRN

## 2022-04-05 NOTE — Telephone Encounter (Signed)
10:16a Received a call from patient's daughter Rebecca [name] stating that patient is to be discharged from [facility] today back to [facility]. She wanted to make sure that Palliative care would order the Morphine she needs to help with her breathing.  10:38a I called Rebecca [name] back and left a voicemail. I also sent her a communication to discuss. I then reached out to both Dr. Mariea Clonts and Damaris Hippo FNP, who both agreed to order the Morphine requested but just needed to know which pharmacy. Our scheduler Rebecca [name], also wanted to know if it is ok to place patient on the schedule for Damaris Hippo FNP to see on Wednesday 04/10/22 @ 10a.   12:18p  Received a communication back from Rebecca [name] stating that they just got patient back to her apartment at [facility] and settled in. The pharmacy they use is [facility]. Patient is agreeable to a visit from Rebecca [name] on above date.   12:34p Dr. Mariea Clonts sent Rx into patient's pharmacy  1:21p Received call from Rebecca [name] stating that they do not have this medication in stock and would have to order it, so it wouldn't be in until Monday.  1:27p Sent communication to [place] seeing if it is ok for her to receive her medication on Monday or if she would like for me to check with other pharmacies.  1:44p  Received return communication from Rebecca [name] stating that she spoke with patient and patient feels she has enough medication to last her until Monday so is ok to wait.  2:07p Called [facility] and let them know to go ahead and order medication and family will pick it up on Monday.   2:42p Communication sent to both Dr. Mariea Clonts and Damaris Hippo FNP to let them know above information. Also let Rebecca [name] know I scheduled for her to see patient on 9/27@10a .

## 2022-04-06 DIAGNOSIS — J440 Chronic obstructive pulmonary disease with acute lower respiratory infection: Secondary | ICD-10-CM | POA: Diagnosis not present

## 2022-04-06 DIAGNOSIS — J441 Chronic obstructive pulmonary disease with (acute) exacerbation: Secondary | ICD-10-CM | POA: Diagnosis not present

## 2022-04-06 DIAGNOSIS — I2510 Atherosclerotic heart disease of native coronary artery without angina pectoris: Secondary | ICD-10-CM | POA: Diagnosis not present

## 2022-04-06 DIAGNOSIS — J9622 Acute and chronic respiratory failure with hypercapnia: Secondary | ICD-10-CM | POA: Diagnosis not present

## 2022-04-06 DIAGNOSIS — J189 Pneumonia, unspecified organism: Secondary | ICD-10-CM | POA: Diagnosis not present

## 2022-04-06 DIAGNOSIS — I110 Hypertensive heart disease with heart failure: Secondary | ICD-10-CM | POA: Diagnosis not present

## 2022-04-06 DIAGNOSIS — I5020 Unspecified systolic (congestive) heart failure: Secondary | ICD-10-CM | POA: Diagnosis not present

## 2022-04-06 DIAGNOSIS — J9621 Acute and chronic respiratory failure with hypoxia: Secondary | ICD-10-CM | POA: Diagnosis not present

## 2022-04-06 DIAGNOSIS — E119 Type 2 diabetes mellitus without complications: Secondary | ICD-10-CM | POA: Diagnosis not present

## 2022-04-08 ENCOUNTER — Encounter: Payer: Self-pay | Admitting: Pulmonary Disease

## 2022-04-08 DIAGNOSIS — J9611 Chronic respiratory failure with hypoxia: Secondary | ICD-10-CM

## 2022-04-08 DIAGNOSIS — J449 Chronic obstructive pulmonary disease, unspecified: Secondary | ICD-10-CM | POA: Diagnosis not present

## 2022-04-08 NOTE — Telephone Encounter (Signed)
Patient called and needs script for oxygen sent to [facility] due to insurance she is no longer with [facility]. Patient states she only has 6 tanks left so it is important to send the order as soon as possible.   Please call patient when order is placed.

## 2022-04-09 ENCOUNTER — Telehealth: Payer: Self-pay | Admitting: Pulmonary Disease

## 2022-04-10 ENCOUNTER — Encounter: Payer: Self-pay | Admitting: Family Medicine

## 2022-04-10 ENCOUNTER — Other Ambulatory Visit: Payer: Medicare HMO | Admitting: Family Medicine

## 2022-04-10 VITALS — BP 140/78 | HR 91 | Resp 32

## 2022-04-10 DIAGNOSIS — K219 Gastro-esophageal reflux disease without esophagitis: Secondary | ICD-10-CM

## 2022-04-10 DIAGNOSIS — I5042 Chronic combined systolic (congestive) and diastolic (congestive) heart failure: Secondary | ICD-10-CM

## 2022-04-10 DIAGNOSIS — J9611 Chronic respiratory failure with hypoxia: Secondary | ICD-10-CM

## 2022-04-10 NOTE — Progress Notes (Signed)
[facility] Palliative Care Consult Note Telephone: [phone]  Fax: [phone]    Date of encounter: 04/10/22 10:22 AM PATIENT NAME: Rebecca [name] [address] [place] [address]   [phone] (home)  DOB: 03/27/1941 MRN: [id number] PRIMARY CARE PROVIDER:    London Pepper, MD,  [place] [address] [phone]  REFERRING PROVIDER:   London Pepper, MD [address] [place],  [place] [address] [phone]  RESPONSIBLE PARTY:    Contact Information     Name Relation Home Work Mobile   Rebecca [name] Daughter [phone]  [phone]   [place] Daughter [phone]  [phone]        I met face to face with patient in her assisted living facility. Palliative Care was asked to follow this patient by consultation request of  Rebecca Pepper, MD to address advance care planning and complex medical decision making. This is a follow up visit   ASSESSMENT , SYMPTOM MANAGEMENT AND PLAN / RECOMMENDATIONS:   Chronic combined systolic and diastolic heart failure Symptomatic with orthopnea, PND on max therapy SOB at rest.  Tachypneic/tachycardic and hypoxic at rest. Continue Morphine 2.6 mg po Q 4 hrs for dyspnea, take before eating and Xanax 0.25 mg BID for anxiety.  2.  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   Palliative Care Counseling Discussed current options to remain on Palliative, to transition to Hospice with increased in home assistance. Discussed goals of care-pt wants to try home care first and think about it.  4.    GERD with unspecified esophagitis Pantoprazole 40 mg daily   Advance Care Planning/Goals of Care: Goals include to maximize quality of life and symptom management. Patient  gave her permission to discuss. Our advance care planning conversation included a discussion  about:    The value and importance of advance care planning  Exploration of personal, cultural or spiritual beliefs that might influence medical decisions-wants a chance to be resuscitated but not intubated.  Does not want to be attached to machines to live.  Have explained about normal loss of function when brain is O2 deprived because of cardiac arrest, potential for rib fractures and likelihood of lack of success if CPR done without intubation. Exploration of goals of care in the event of a sudden injury or illness -wants CPR without intubation Identification of a healthcare agent -daughter Rebecca [name] Review of an advance directive document-MOST  CODE STATUS: MOST: Resuscitate, DNI does not want to be maintained on machines IV fluids and antibiotics as indicated No feeding tube     Follow up Palliative Care Visit: Palliative care will continue to follow for complex medical decision making, advance care planning, and clarification of goals. Return 2 weeks or prn.    This visit was coded based on medical decision making (MDM).  PPS: 50%  HOSPICE ELIGIBILITY/DIAGNOSIS: TBD  Chief Complaint:  Palliative Care is continuing to follow pt for chronic medical management in setting of chronic systolic and diastolic heart failure and respiratory failure.  HISTORY OF PRESENT ILLNESS:  Rebecca [name] is a 81 y.o. year old female with chronic combined systolic and diastolic heart failure, chronic hypoxic and hypercapneic failure, cardiomyopathy, COPD, HTN, LBBB, CAD, DM, anemia and protein calorie malnutrition.  Endorses orthopnea and PND, increased SOB.  She states morphine helps with dyspnea. Taking Morphine every 5-6 hours.  Has had indigestion since arriving  at home.  Denies lightheadedness, palpitations.  Denies nausea, vomiting or constipation. Appetite is good.  Has increased dyspnea with eating.  She has to turn O2 up while eating. Is taking Morphine before meals.  Afraid to shower alone.   Denies feeling down, depressed, hopeless. She states she had some difficulty with breathing and thought that was coincidental with Gabapentin. She states "I don't want to be hooked up to machines to keep me alive." Dr Rebecca [name] needs to send nebulizer med prescriptions and O2 prescription to [facility] as [facility] no longer supports [employer].  History obtained from review of EMR, discussion with Rebecca [name].      Latest Ref Rng & Units 03/24/2022    5:21 AM 03/21/2022    5:54 AM 03/20/2022    3:56 PM  CBC  WBC 4.0 - 10.5 K/uL 9.1  7.5    Hemoglobin 12.0 - 15.0 g/dL 11.5  10.0  10.5   Hematocrit 36.0 - 46.0 % 37.2  32.2  31.0   Platelets 150 - 400 K/uL 228  198         Latest Ref Rng & Units 03/24/2022    5:21 AM 03/21/2022    5:54 AM 03/20/2022    3:56 PM  CMP  Glucose 70 - 99 mg/dL 115  118    BUN 8 - 23 mg/dL 26  21    Creatinine 0.44 - 1.00 mg/dL 0.74  0.66    Sodium 135 - 145 mmol/L 140  139  140   Potassium 3.5 - 5.1 mmol/L 4.4  3.9  3.4   Chloride 98 - 111 mmol/L 88  89    CO2 22 - 32 mmol/L 44  41    Calcium 8.9 - 10.3 mg/dL 9.3  9.5      03/21/22 HGB A1c 6.0%  03/20/22 Portable CXR: FINDINGS: Lungs are hyperinflated as can be seen with COPD. No focal consolidation. No pleural effusion or pneumothorax. Heart and mediastinal contours are unremarkable.   No acute osseous abnormality.   IMPRESSION: 1. No active disease. 2. COPD.  08/18/2020 2d echo: 1. Left ventricular EF 40 to 45%. The left ventricle has mildly decreased function with regional wall motion abnormalities (see scoring diagram/findings for description). Grade I diastolic dysfunction (impaired relaxation).   2. Right ventricular size and systolic function are normal.  3. The mitral valve is normal in structure. No evidence of mitral valve regurgitation.   4. The aortic valve is grossly normal. Aortic valve regurgitation is not visualized.    I reviewed EMR for available labs, medications, imaging, studies and related  documents. Records reviewed and summarized above.   ROS General: NAD ENMT: denies dysphagia Cardiovascular: denies chest pain, endorses DOE, orthopnea and PND Pulmonary: denies cough, endorses increased SOB Abdomen: endorses good appetite, denies constipation, endorses continence of bowel GU: denies dysuria, endorses continence of urine MSK:  denies increased weakness, no falls reported Skin: denies rashes or wounds Neurological: some pain in BLE decreased with Gabapentin use, denies insomnia Psych: Endorses positive mood Heme/lymph/immuno: denies bruises, abnormal bleeding  Physical Exam: Current and past weights: 152 lbs as of 03/20/22, weight 01/09/22 148 lbs 12.8 ounces Constitutional: NAD General: WNWD CV: S1S2, RRR, no LE edema Pulmonary:  Tachypneic/dyspneic//hypoxic at rest on 5L [place], fine crackles LLL, otherwise diminished in all other lung fields.Noted increased work of breathing with pursed lip breathing Abdomen: normo-active BS + 4 quadrants, soft and non tender MSK: no sarcopenia, moves all extremities, minimally ambulatory with rollator Skin: warm  and dry, no rashes or wounds on visible skin Neuro:  noted generalized weakness,  no cognitive impairment Psych: non-anxious affect, A and O x 3 Hem/lymph/immuno: no widespread bruising   Thank you for the opportunity to participate in the care of Rebecca [name].  The palliative care team will continue to follow. Please call our office at [phone] if we can be of additional assistance.   Marijo Conception, FNP -C  COVID-19 PATIENT SCREENING TOOL Asked and negative response unless otherwise noted:   Have you had symptoms of covid, tested positive or been in contact with someone with symptoms/positive test in the past 5-10 days?  No

## 2022-04-11 ENCOUNTER — Encounter: Payer: Self-pay | Admitting: Pulmonary Disease

## 2022-04-12 ENCOUNTER — Telehealth: Payer: Self-pay

## 2022-04-12 NOTE — Telephone Encounter (Signed)
Mychart message sent by pt so closing this encounter.

## 2022-04-12 NOTE — Telephone Encounter (Signed)
Called to discuss pt's oxygen with daughter [name], was unable to reach so I had to LVM, I asked her to call office back Monday.

## 2022-04-12 NOTE — Telephone Encounter (Signed)
Dr. Alva, please see mychart message sent by pt and advise. ?

## 2022-04-13 ENCOUNTER — Encounter: Payer: Self-pay | Admitting: Internal Medicine

## 2022-04-13 DIAGNOSIS — K219 Gastro-esophageal reflux disease without esophagitis: Secondary | ICD-10-CM | POA: Insufficient documentation

## 2022-04-15 ENCOUNTER — Other Ambulatory Visit: Payer: Self-pay

## 2022-04-15 ENCOUNTER — Ambulatory Visit: Payer: Medicare HMO | Admitting: Cardiology

## 2022-04-15 DIAGNOSIS — J9611 Chronic respiratory failure with hypoxia: Secondary | ICD-10-CM

## 2022-04-15 NOTE — Telephone Encounter (Signed)
Patient's daughter calling back regarding oxygen- please call back at. [employer] stopped covering oxygen through [employer] and needs to go through [employer]- needs more oxygen and nebulizer. [name] states that [employer] needs more information. [employer] is also needing a new sleep study. Call back at [phone].

## 2022-04-15 NOTE — Telephone Encounter (Signed)
Spoke with [facility] Friday 9/29 who was currently handling this. Routing to [place] to see if all was taken care of.

## 2022-04-16 DIAGNOSIS — J449 Chronic obstructive pulmonary disease, unspecified: Secondary | ICD-10-CM | POA: Diagnosis not present

## 2022-04-16 DIAGNOSIS — J9610 Chronic respiratory failure, unspecified whether with hypoxia or hypercapnia: Secondary | ICD-10-CM | POA: Diagnosis not present

## 2022-04-18 ENCOUNTER — Other Ambulatory Visit: Payer: Self-pay | Admitting: Physician Assistant

## 2022-04-18 ENCOUNTER — Ambulatory Visit: Payer: Medicare HMO | Admitting: Adult Health

## 2022-04-19 ENCOUNTER — Telehealth: Payer: Self-pay | Admitting: Pulmonary Disease

## 2022-04-19 NOTE — Telephone Encounter (Signed)
Received a phone call from [facility], occupational therapist with [facility]. She stated she was called to go check on patient. When she got there to evaluate pt, stated that with ambulation, pt's O2 sats dropped to 71% and when pt was sitting at rest while eating lunch, her sats were ranging from 81-88%. This is all on 5L O2.  Melissa stated that pt is breathing heavily and having to stop to get her breath after each bite of food. Stated to [facility] that pt needed to go be evaluated at either UC or ED and she verbalized understanding and said she would let pt know. Nothing further needed.

## 2022-04-22 ENCOUNTER — Telehealth: Payer: Self-pay | Admitting: Internal Medicine

## 2022-04-22 NOTE — Telephone Encounter (Signed)
Each hospitalization, pt declines.  She really did not have enough support at home.  She has caregivers coming into her ILF.  She has not bounced back with [facility] therapy.  She's lost short-term memory and stamina.  She's isolating in her room.  Not going to meals.  Lives in a state of panic and fear.  She's not thriving.  Her oxygen carrier was changed which threw her for a loop--now has [facility].  She used to keep 28 tanks in her room which gave her a sense of comfort that she'd never run out.  She has an unreal fear that the 8 tanks are not enough.  She feels better on her concentrator.  Dr. Elsworth Soho said to stay on that.  She is saying that for that reason she cannot leave her room.  [name] called [employer] for 8 more tanks.  She doesn't have a good routine with the home health.  She needs consistency.  She is very shaky from her nebs.  She's spilling her morphine when she tries to take it.  She has a friend who is a Therapist, sports that stays at [employer] been helping her with things that really she shouldn't be.  She is drawing up the morphine for her.  Ebony from our triage suggested drawing them up so pt can squirt them in the cups.  Pt leaves her door open.  She's having paranoia.  VS dropped terribly during her bath with OT.  She panicked.  It dropped to 80-83%.  Improves when she coughs up plugs of mucus and gets better.  [name] and her husband spent 4.5 hrs there last weekend.  She was very fatigued.  She takes her morphine, then naps, does nebulize 4 times per day.  She's eating in her room.  Appetite is ok.  Basically naps all day b/w meals/morphine/nebs.  Today the bipap was brought out but she and her friend were only there not [name] so she won't use it until [place] hears it.    Tammy Parrett has been seeing her most and recommending hospice per [name].  I'll touch base with her about a hospice order as they are interested in pursuing this.

## 2022-04-24 ENCOUNTER — Telehealth: Payer: Self-pay | Admitting: Pulmonary Disease

## 2022-04-24 DIAGNOSIS — J441 Chronic obstructive pulmonary disease with (acute) exacerbation: Secondary | ICD-10-CM

## 2022-04-24 DIAGNOSIS — J9611 Chronic respiratory failure with hypoxia: Secondary | ICD-10-CM

## 2022-04-25 NOTE — Telephone Encounter (Signed)
Referral order has been placed for pt to be referred to hospice. Called and spoke with Judeen Hammans with [facility] letting her know this had been done and she verbalized understanding. Nothing further needed.

## 2022-04-25 NOTE — Telephone Encounter (Signed)
Dr. Elsworth Soho, please advise if you are okay with this referral order being placed.

## 2022-04-26 ENCOUNTER — Other Ambulatory Visit: Payer: Self-pay

## 2022-04-26 DIAGNOSIS — Z515 Encounter for palliative care: Secondary | ICD-10-CM

## 2022-04-26 DIAGNOSIS — R5381 Other malaise: Secondary | ICD-10-CM

## 2022-05-02 DIAGNOSIS — J439 Emphysema, unspecified: Secondary | ICD-10-CM

## 2022-05-02 NOTE — Telephone Encounter (Signed)
Called and left message on daughter's voicemail.  So sorry to hear that her mom is not doing well.  Most likely will need to talk to hospice social worker to help placement for assisted living.  Typically do not need an order but will be more difficult to place since she is higher needed.  Typically social worker from hospice can help with this.  May send order to DME or hospice for additional tubing and oxygen supplies.

## 2022-05-06 ENCOUNTER — Telehealth: Payer: Self-pay | Admitting: Adult Health

## 2022-05-08 ENCOUNTER — Ambulatory Visit: Payer: Medicare HMO | Admitting: Adult Health

## 2022-05-08 ENCOUNTER — Encounter: Payer: Self-pay | Admitting: Cardiology

## 2022-05-14 ENCOUNTER — Ambulatory Visit: Payer: Medicare HMO | Admitting: Cardiology

## 2022-05-15 NOTE — Telephone Encounter (Signed)
Called daughter and she states she was needing to cancel all further appointment with [place] due to rapid decline. Daughter wanted to tell us how grateful she is with our office and staff. And she just wanted to say thank you to tammy for looking after her mother.  Just FYI tammy

## 2022-05-15 DEATH — deceased
